# Patient Record
Sex: Male | Born: 1955 | Race: White | Hispanic: No | Marital: Married | Smoking: Former smoker
Health system: Southern US, Community
[De-identification: ages and names within clinical notes are randomized; demographics above are authoritative.]

## PROBLEM LIST (undated history)

## (undated) DIAGNOSIS — J449 Chronic obstructive pulmonary disease, unspecified: Secondary | ICD-10-CM

## (undated) DIAGNOSIS — E669 Obesity, unspecified: Secondary | ICD-10-CM

## (undated) DIAGNOSIS — J189 Pneumonia, unspecified organism: Secondary | ICD-10-CM

## (undated) HISTORY — PX: CHOLECYSTECTOMY: SHX55

---

## 2015-11-07 ENCOUNTER — Inpatient Hospital Stay (HOSPITAL_COMMUNITY): Payer: Self-pay

## 2015-11-07 ENCOUNTER — Inpatient Hospital Stay (HOSPITAL_COMMUNITY)
Admission: AD | Admit: 2015-11-07 | Discharge: 2015-11-18 | DRG: 870 | Disposition: A | Discharge status: Home / Self Care | Payer: Self-pay | Source: Other Acute Inpatient Hospital | Attending: Internal Medicine | Admitting: Internal Medicine

## 2015-11-07 DIAGNOSIS — J441 Chronic obstructive pulmonary disease with (acute) exacerbation: Secondary | ICD-10-CM | POA: Diagnosis present

## 2015-11-07 DIAGNOSIS — G9341 Metabolic encephalopathy: Secondary | ICD-10-CM | POA: Diagnosis present

## 2015-11-07 DIAGNOSIS — E870 Hyperosmolality and hypernatremia: Secondary | ICD-10-CM | POA: Diagnosis not present

## 2015-11-07 DIAGNOSIS — E874 Mixed disorder of acid-base balance: Secondary | ICD-10-CM | POA: Diagnosis present

## 2015-11-07 DIAGNOSIS — J9602 Acute respiratory failure with hypercapnia: Secondary | ICD-10-CM | POA: Diagnosis present

## 2015-11-07 DIAGNOSIS — G9340 Encephalopathy, unspecified: Secondary | ICD-10-CM

## 2015-11-07 DIAGNOSIS — Z4659 Encounter for fitting and adjustment of other gastrointestinal appliance and device: Secondary | ICD-10-CM

## 2015-11-07 DIAGNOSIS — L8990 Pressure ulcer of unspecified site, unspecified stage: Secondary | ICD-10-CM | POA: Insufficient documentation

## 2015-11-07 DIAGNOSIS — J9819 Other pulmonary collapse: Secondary | ICD-10-CM

## 2015-11-07 DIAGNOSIS — R7303 Prediabetes: Secondary | ICD-10-CM | POA: Diagnosis present

## 2015-11-07 DIAGNOSIS — Z87891 Personal history of nicotine dependence: Secondary | ICD-10-CM

## 2015-11-07 DIAGNOSIS — Z6841 Body Mass Index (BMI) 40.0 and over, adult: Secondary | ICD-10-CM

## 2015-11-07 DIAGNOSIS — T17990A Other foreign object in respiratory tract, part unspecified in causing asphyxiation, initial encounter: Secondary | ICD-10-CM | POA: Diagnosis not present

## 2015-11-07 DIAGNOSIS — J95851 Ventilator associated pneumonia: Secondary | ICD-10-CM | POA: Diagnosis present

## 2015-11-07 DIAGNOSIS — R21 Rash and other nonspecific skin eruption: Secondary | ICD-10-CM | POA: Diagnosis not present

## 2015-11-07 DIAGNOSIS — E876 Hypokalemia: Secondary | ICD-10-CM | POA: Diagnosis present

## 2015-11-07 DIAGNOSIS — J189 Pneumonia, unspecified organism: Secondary | ICD-10-CM

## 2015-11-07 DIAGNOSIS — N179 Acute kidney failure, unspecified: Secondary | ICD-10-CM | POA: Diagnosis present

## 2015-11-07 DIAGNOSIS — J9601 Acute respiratory failure with hypoxia: Secondary | ICD-10-CM

## 2015-11-07 DIAGNOSIS — Z978 Presence of other specified devices: Secondary | ICD-10-CM

## 2015-11-07 DIAGNOSIS — Z9289 Personal history of other medical treatment: Secondary | ICD-10-CM

## 2015-11-07 DIAGNOSIS — J9690 Respiratory failure, unspecified, unspecified whether with hypoxia or hypercapnia: Secondary | ICD-10-CM

## 2015-11-07 DIAGNOSIS — J440 Chronic obstructive pulmonary disease with acute lower respiratory infection: Secondary | ICD-10-CM | POA: Diagnosis present

## 2015-11-07 DIAGNOSIS — R6521 Severe sepsis with septic shock: Secondary | ICD-10-CM | POA: Diagnosis present

## 2015-11-07 DIAGNOSIS — Z452 Encounter for adjustment and management of vascular access device: Secondary | ICD-10-CM

## 2015-11-07 DIAGNOSIS — A419 Sepsis, unspecified organism: Principal | ICD-10-CM

## 2015-11-07 HISTORY — DX: Chronic obstructive pulmonary disease, unspecified: J44.9

## 2015-11-07 HISTORY — DX: Obesity, unspecified: E66.9

## 2015-11-07 HISTORY — DX: Pneumonia, unspecified organism: J18.9

## 2015-11-07 LAB — BLOOD GAS, ARTERIAL
ACID-BASE EXCESS: 0.6 mmol/L (ref 0.0–2.0)
ACID-BASE EXCESS: 0.9 mmol/L (ref 0.0–2.0)
Bicarbonate: 27.8 mEq/L — ABNORMAL HIGH (ref 20.0–24.0)
Bicarbonate: 27.1 mEq/L — ABNORMAL HIGH (ref 20.0–24.0)
DRAWN BY: 2502031
DRAWN BY: 24486
FIO2: 1
FIO2: 1
MECHVT: 600 mL
O2 SAT: 81.7 %
O2 Saturation: 95.1 %
PCO2 ART: 63.1 mmHg — AB (ref 35.0–45.0)
PEEP/CPAP: 5 cmH2O
PEEP: 8 cmH2O
PH ART: 7.26 — AB (ref 7.350–7.450)
PO2 ART: 94.5 mmHg (ref 80.0–100.0)
Patient temperature: 98.6
Patient temperature: 99.6
RATE: 16 resp/min
RATE: 22 resp/min
TCO2: 30.1 mmol/L (ref 0–100)
TCO2: 29 mmol/L (ref 0–100)
VT: 600 mL
pCO2 arterial: 74.2 mmHg (ref 35.0–45.0)
pH, Arterial: 7.199 — CL (ref 7.350–7.450)
pO2, Arterial: 54.2 mmHg — ABNORMAL LOW (ref 80.0–100.0)

## 2015-11-07 LAB — COMPREHENSIVE METABOLIC PANEL
ALBUMIN: 2 g/dL — AB (ref 3.5–5.0)
ALK PHOS: 171 U/L — AB (ref 38–126)
ALT: 42 U/L (ref 17–63)
AST: 69 U/L — AB (ref 15–41)
Anion gap: 12 (ref 5–15)
BILIRUBIN TOTAL: 1.7 mg/dL — AB (ref 0.3–1.2)
BUN: 45 mg/dL — AB (ref 6–20)
CALCIUM: 7.6 mg/dL — AB (ref 8.9–10.3)
CO2: 25 mmol/L (ref 22–32)
CREATININE: 2 mg/dL — AB (ref 0.61–1.24)
Chloride: 99 mmol/L — ABNORMAL LOW (ref 101–111)
GFR calc Af Amer: 41 mL/min — ABNORMAL LOW (ref >60)
GFR, EST NON AFRICAN AMERICAN: 36 mL/min — AB (ref >60)
GLUCOSE: 106 mg/dL — AB (ref 65–99)
Potassium: 5 mmol/L (ref 3.5–5.1)
Sodium: 136 mmol/L (ref 135–145)
TOTAL PROTEIN: 6.3 g/dL — AB (ref 6.5–8.1)

## 2015-11-07 LAB — POCT I-STAT 3, ART BLOOD GAS (G3+)
ACID-BASE DEFICIT: 2 mmol/L (ref 0.0–2.0)
Bicarbonate: 29.4 mEq/L — ABNORMAL HIGH (ref 20.0–24.0)
O2 SAT: 93 %
PCO2 ART: 82.1 mmHg — AB (ref 35.0–45.0)
TCO2: 32 mmol/L (ref 0–100)
pH, Arterial: 7.164 — CL (ref 7.350–7.450)
pO2, Arterial: 90 mmHg (ref 80.0–100.0)

## 2015-11-07 LAB — URINALYSIS, ROUTINE W REFLEX MICROSCOPIC
GLUCOSE, UA: NEGATIVE mg/dL
Ketones, ur: 15 mg/dL — AB
Leukocytes, UA: NEGATIVE
Nitrite: NEGATIVE
PH: 5.5 (ref 5.0–8.0)
PROTEIN: 100 mg/dL — AB
Specific Gravity, Urine: 1.023 (ref 1.005–1.030)

## 2015-11-07 LAB — MAGNESIUM: MAGNESIUM: 1.8 mg/dL (ref 1.7–2.4)

## 2015-11-07 LAB — CBC
HEMATOCRIT: 44.7 % (ref 39.0–52.0)
HEMOGLOBIN: 13.9 g/dL (ref 13.0–17.0)
MCH: 28.6 pg (ref 26.0–34.0)
MCHC: 31.1 g/dL (ref 30.0–36.0)
MCV: 92 fL (ref 78.0–100.0)
Platelets: 251 10*3/uL (ref 150–400)
RBC: 4.86 MIL/uL (ref 4.22–5.81)
RDW: 15.3 % (ref 11.5–15.5)
WBC: 34.5 10*3/uL — AB (ref 4.0–10.5)

## 2015-11-07 LAB — CARBOXYHEMOGLOBIN
Carboxyhemoglobin: 0.8 % (ref 0.5–1.5)
Methemoglobin: 0.9 % (ref 0.0–1.5)
O2 SAT: 93.9 %
Total hemoglobin: 13.5 g/dL (ref 13.5–18.0)

## 2015-11-07 LAB — URINE MICROSCOPIC-ADD ON

## 2015-11-07 LAB — PROTIME-INR
INR: 1.48 (ref 0.00–1.49)
PROTHROMBIN TIME: 18 s — AB (ref 11.6–15.2)

## 2015-11-07 LAB — MRSA PCR SCREENING: MRSA BY PCR: POSITIVE — AB

## 2015-11-07 LAB — TYPE AND SCREEN
ABO/RH(D): B POS
Antibody Screen: NEGATIVE

## 2015-11-07 LAB — GLUCOSE, CAPILLARY: Glucose-Capillary: 107 mg/dL — ABNORMAL HIGH (ref 65–99)

## 2015-11-07 LAB — TROPONIN I: TROPONIN I: 0.27 ng/mL — AB (ref <0.031)

## 2015-11-07 LAB — APTT: APTT: 34 s (ref 24–37)

## 2015-11-07 LAB — LACTIC ACID, PLASMA: Lactic Acid, Venous: 2.1 mmol/L (ref 0.5–2.0)

## 2015-11-07 LAB — CORTISOL: CORTISOL PLASMA: 69.4 ug/dL

## 2015-11-07 MED ORDER — IPRATROPIUM-ALBUTEROL 0.5-2.5 (3) MG/3ML IN SOLN
3.0000 mL | Freq: Four times a day (QID) | RESPIRATORY_TRACT | Status: DC
  Administered 2015-11-07 – 2015-11-17 (×37): 3 mL via RESPIRATORY_TRACT
  Filled 2015-11-07 (×36): qty 3

## 2015-11-07 MED ORDER — FENTANYL CITRATE (PF) 100 MCG/2ML IJ SOLN
100.0000 ug | INTRAMUSCULAR | Status: AC | PRN
  Administered 2015-11-08 (×2): 100 ug via INTRAVENOUS
  Administered 2015-11-08: 50 ug via INTRAVENOUS
  Filled 2015-11-07 (×2): qty 2

## 2015-11-07 MED ORDER — NOREPINEPHRINE BITARTRATE 1 MG/ML IV SOLN
0.0000 ug/min | INTRAVENOUS | Status: DC
  Administered 2015-11-07: 6 ug/min via INTRAVENOUS
  Filled 2015-11-07: qty 4

## 2015-11-07 MED ORDER — SODIUM BICARBONATE 8.4 % IV SOLN
INTRAVENOUS | Status: AC
  Administered 2015-11-07: 50 meq
  Filled 2015-11-07: qty 50

## 2015-11-07 MED ORDER — DEXMEDETOMIDINE HCL IN NACL 200 MCG/50ML IV SOLN
0.0000 ug/kg/h | INTRAVENOUS | Status: DC
  Filled 2015-11-07: qty 50

## 2015-11-07 MED ORDER — PHENYLEPHRINE HCL 10 MG/ML IJ SOLN
20.0000 ug/min | INTRAVENOUS | Status: DC
  Administered 2015-11-07: 20 ug/min via INTRAVENOUS
  Filled 2015-11-07: qty 4

## 2015-11-07 MED ORDER — ENOXAPARIN SODIUM 40 MG/0.4ML ~~LOC~~ SOLN
40.0000 mg | SUBCUTANEOUS | Status: DC
  Administered 2015-11-07: 40 mg via SUBCUTANEOUS
  Filled 2015-11-07 (×2): qty 0.4

## 2015-11-07 MED ORDER — MIDAZOLAM HCL 2 MG/2ML IJ SOLN
2.0000 mg | INTRAMUSCULAR | Status: DC | PRN
  Administered 2015-11-07 – 2015-11-14 (×6): 2 mg via INTRAVENOUS
  Filled 2015-11-07 (×6): qty 2

## 2015-11-07 MED ORDER — SODIUM CHLORIDE 0.9 % IV BOLUS (SEPSIS)
1000.0000 mL | Freq: Once | INTRAVENOUS | Status: AC
  Administered 2015-11-07: 1000 mL via INTRAVENOUS

## 2015-11-07 MED ORDER — SODIUM BICARBONATE 8.4 % IV SOLN
INTRAVENOUS | Status: AC
  Filled 2015-11-07: qty 100

## 2015-11-07 MED ORDER — CHLORHEXIDINE GLUCONATE 0.12% ORAL RINSE (MEDLINE KIT)
15.0000 mL | Freq: Two times a day (BID) | OROMUCOSAL | Status: DC
  Administered 2015-11-07 – 2015-11-11 (×9): 15 mL via OROMUCOSAL

## 2015-11-07 MED ORDER — MIDAZOLAM HCL 2 MG/2ML IJ SOLN
1.0000 mg | Freq: Once | INTRAMUSCULAR | Status: AC
  Administered 2015-11-07: 1 mg via INTRAVENOUS
  Filled 2015-11-07: qty 2

## 2015-11-07 MED ORDER — VASOPRESSIN 20 UNIT/ML IV SOLN
0.0300 [IU]/min | INTRAVENOUS | Status: DC
  Administered 2015-11-07: 0.03 [IU]/min via INTRAVENOUS
  Filled 2015-11-07: qty 2

## 2015-11-07 MED ORDER — DEXTROSE 5 % IV SOLN
500.0000 mg | INTRAVENOUS | Status: DC
  Administered 2015-11-08 – 2015-11-10 (×3): 500 mg via INTRAVENOUS
  Filled 2015-11-07 (×3): qty 500

## 2015-11-07 MED ORDER — CHLORHEXIDINE GLUCONATE 0.12% ORAL RINSE (MEDLINE KIT): 15.0000 mL | Freq: Two times a day (BID) | OROMUCOSAL | Status: DC

## 2015-11-07 MED ORDER — SODIUM BICARBONATE 8.4 % IV SOLN
INTRAVENOUS | Status: DC
  Administered 2015-11-07: 23:00:00 via INTRAVENOUS
  Filled 2015-11-07 (×2): qty 150

## 2015-11-07 MED ORDER — MIDAZOLAM HCL 2 MG/2ML IJ SOLN
2.0000 mg | INTRAMUSCULAR | Status: AC | PRN
  Administered 2015-11-08 – 2015-11-14 (×3): 2 mg via INTRAVENOUS
  Filled 2015-11-07 (×3): qty 2

## 2015-11-07 MED ORDER — LEVOFLOXACIN IN D5W 750 MG/150ML IV SOLN: 750.0000 mg | INTRAVENOUS | Status: DC

## 2015-11-07 MED ORDER — SODIUM CHLORIDE 0.9 % IV SOLN: INTRAVENOUS | Status: DC | PRN

## 2015-11-07 MED ORDER — DOBUTAMINE IN D5W 4-5 MG/ML-% IV SOLN
2.5000 ug/kg/min | INTRAVENOUS | Status: DC
  Filled 2015-11-07: qty 250

## 2015-11-07 MED ORDER — NOREPINEPHRINE BITARTRATE 1 MG/ML IV SOLN
5.0000 ug/min | INTRAVENOUS | Status: DC
  Filled 2015-11-07: qty 4

## 2015-11-07 MED ORDER — SODIUM CHLORIDE 0.9 % IV SOLN
2000.0000 mg | Freq: Once | INTRAVENOUS | Status: AC
  Administered 2015-11-07: 2000 mg via INTRAVENOUS
  Filled 2015-11-07: qty 2000

## 2015-11-07 MED ORDER — PANTOPRAZOLE SODIUM 40 MG IV SOLR
40.0000 mg | Freq: Every day | INTRAVENOUS | Status: DC
  Administered 2015-11-07 – 2015-11-09 (×3): 40 mg via INTRAVENOUS
  Filled 2015-11-07 (×4): qty 40

## 2015-11-07 MED ORDER — SODIUM BICARBONATE 8.4 % IV SOLN
100.0000 meq | Freq: Once | INTRAVENOUS | Status: AC
Start: 2015-11-07 — End: 2015-11-07
  Administered 2015-11-07: 100 meq via INTRAVENOUS
  Filled 2015-11-07: qty 100

## 2015-11-07 MED ORDER — OSELTAMIVIR PHOSPHATE 6 MG/ML PO SUSR
75.0000 mg | Freq: Two times a day (BID) | ORAL | Status: DC
  Administered 2015-11-08 – 2015-11-10 (×6): 75 mg via ORAL
  Filled 2015-11-07 (×7): qty 12.5

## 2015-11-07 MED ORDER — VASOPRESSIN 20 UNIT/ML IV SOLN
0.0300 [IU]/min | INTRAVENOUS | Status: DC
  Filled 2015-11-07: qty 2

## 2015-11-07 MED ORDER — ANTISEPTIC ORAL RINSE SOLUTION (CORINZ)
7.0000 mL | Freq: Four times a day (QID) | OROMUCOSAL | Status: DC
  Administered 2015-11-08 – 2015-11-14 (×25): 7 mL via OROMUCOSAL

## 2015-11-07 MED ORDER — FENTANYL CITRATE (PF) 100 MCG/2ML IJ SOLN
50.0000 ug | Freq: Once | INTRAMUSCULAR | Status: AC
  Administered 2015-11-07: 50 ug via INTRAVENOUS
  Filled 2015-11-07: qty 2

## 2015-11-07 MED ORDER — ANTISEPTIC ORAL RINSE SOLUTION (CORINZ): 7.0000 mL | Freq: Four times a day (QID) | OROMUCOSAL | Status: DC

## 2015-11-07 MED ORDER — MIDAZOLAM HCL 2 MG/2ML IJ SOLN
INTRAMUSCULAR | Status: AC
Start: 2015-11-07 — End: 2015-11-07
  Administered 2015-11-07: 2 mg via INTRAVENOUS
  Filled 2015-11-07: qty 2

## 2015-11-07 MED ORDER — FENTANYL CITRATE (PF) 100 MCG/2ML IJ SOLN
INTRAMUSCULAR | Status: AC
  Administered 2015-11-07: 100 ug via INTRAVENOUS
  Filled 2015-11-07: qty 2

## 2015-11-07 MED ORDER — FENTANYL CITRATE (PF) 100 MCG/2ML IJ SOLN
100.0000 ug | INTRAMUSCULAR | Status: DC | PRN
  Administered 2015-11-07 – 2015-11-08 (×3): 100 ug via INTRAVENOUS
  Filled 2015-11-07 (×4): qty 2

## 2015-11-07 MED ORDER — PIPERACILLIN-TAZOBACTAM 3.375 G IVPB
3.3750 g | Freq: Three times a day (TID) | INTRAVENOUS | Status: DC
  Administered 2015-11-07 – 2015-11-16 (×26): 3.375 g via INTRAVENOUS
  Filled 2015-11-07 (×29): qty 50

## 2015-11-07 MED ORDER — HYDROCORTISONE NA SUCCINATE PF 100 MG IJ SOLR
50.0000 mg | Freq: Four times a day (QID) | INTRAMUSCULAR | Status: DC
  Administered 2015-11-07: 50 mg via INTRAVENOUS
  Filled 2015-11-07: qty 2

## 2015-11-07 MED ORDER — PHENYLEPHRINE HCL 10 MG/ML IJ SOLN: 20.0000 ug/min | INTRAVENOUS | Status: DC

## 2015-11-07 MED ORDER — SODIUM CHLORIDE 0.9 % IV SOLN: 250.0000 mL | INTRAVENOUS | Status: DC | PRN

## 2015-11-07 MED ORDER — NOREPINEPHRINE BITARTRATE 1 MG/ML IV SOLN
5.0000 ug/min | INTRAVENOUS | Status: DC
  Filled 2015-11-07: qty 16

## 2015-11-07 NOTE — Progress Notes (Signed)
RT advanced ETT tube 2cm per Dr. Marchelle Gearingamaswamy. Tube was located at 23cm at the lip and is now at 25cm at the lip. Patient remained stable, and there were no complications.

## 2015-11-07 NOTE — Procedures (Signed)
Arterial Catheter Insertion Procedure Note Kenneth Casey [id number] 07/09/1956  Procedure: Insertion of Arterial Catheter  Indications: Blood pressure monitoring  Procedure Details Consent: Unable to obtain consent because of emergent medical necessity. Time Out: Verified patient identification, verified procedure, site/side was marked, verified correct patient position, special equipment/implants available, medications/allergies/relevent history reviewed, required imaging and test results available.  Performed  Maximum sterile technique was used including antiseptics, cap, gloves, gown, hand hygiene, mask and sheet. Skin prep: Chlorhexidine; local anesthetic administered 20 gauge catheter was inserted into right radial artery using the Seldinger technique.  Evaluation Blood flow good; BP tracing good. Complications: No apparent complications.   Kenneth SkeansSilva, Kenneth Casey 11/07/2015

## 2015-11-07 NOTE — H&P (Signed)
PULMONARY / CRITICAL CARE MEDICINE   Name: Kenneth Casey MRN: [id number] DOB: 01/16/1956    ADMISSION DATE:  11/07/2015 CONSULTATION DATE:   11/07/15  REFERRING MD :  [facility]  CHIEF COMPLAINT:  Hypercarbic respiratory failure  INITIAL PRESENTATION: Kenneth Casey is a 59 y/o male with a h/o COPD who presented to [facility] with a 1 week history of cough, fevers, and worsening SOB found to be hypoxic to 86% on RA with an initial lactic acid of 3.6 and BNP of 703. He eventually required intubation for hypercarbic respiratory failure and was stated on Levophed for hypotension.    STUDIES:  CXR 11/28: Almost complete opacification of the R lung with air bronchograms.    SIGNIFICANT EVENTS: 11/28: [facility], intubated, started on Levo  MICROBIOLOGY  Blood culture [facility] 11/28 >> Sputum [facility] 11/28 >> Blood cx [facility] 11/28 >>  ANTIBIOTICS Zosyn 11/28>> Levaquin 11/28 >> 11/28 Azithromycin 11/28-11/28  Ceftriaxone 11/28-11/28    HISTORY OF PRESENT ILLNESS:  Kenneth Casey is a 59 y/o male with a h/o COPD who presented to [facility] with a 1 week history of cough, fevers, and worsening SOB found to be hypoxic to 86% on RA with an initial lactic acid of 3.6 and BNP of 703. Blood and sputum cultures were obtained. He received a dose of azithromcyin and ceftriaxone and then broadened to Zosyn/Levaquin.  Initial ABG was 7.28/pCO2 58/pO2 66 on 4L, however he continued to decline and did not respond to BiPAP. He was subsequently intubated and transferred to [facility]. His last ABG on A/C 28, TV 500, PEEP 5, FiO2 100% was 7.3, pCO2 55, pO2 101. He received a total of 3.5L. He was also noted to be hypotensive down to 85/50 and was stated on Levophed. En route he required Fentanyl 100mcg for agitation.   PAST MEDICAL HISTORY :  Per EMR: COPD, Previous tobacco abuse   Prior to Admission medications   Not on File   Not  on File NKDA per EMR FAMILY HISTORY:  Unable to obtain has no family status information on file.  SOCIAL HISTORY:  Per EMR: Lives in [place], self-employed, quit smoking cigarettes 3 years ago, 35 pack year history  REVIEW OF SYSTEMS:  Unable to obtain due to intubation   SUBJECTIVE:  Intubated  VITAL SIGNS: Temp:  [99.2 F (37.3 C)] 99.2 F (37.3 C) (11/28 1910) Pulse Rate:  [113-130] 113 (11/28 2205) Resp:  [0-33] 22 (11/28 2205) BP: (46-126)/(30-64) 88/57 mmHg (11/28 2205) SpO2:  [91 %-94 %] 94 % (11/28 2205) FiO2 (%):  [100 %] 100 % (11/28 2135) Weight:  [327 lb 6.1 oz (148.5 kg)] 327 lb 6.1 oz (148.5 kg) (11/28 1830) HEMODYNAMICS:   VENTILATOR SETTINGS: Vent Mode:  [-] PRVC FiO2 (%):  [100 %] 100 % Set Rate:  [16 bmp-22 bmp] 22 bmp Vt Set:  [550 mL-600 mL] 600 mL PEEP:  [5 cmH20-14 cmH20] 14 cmH20 Plateau Pressure:  [29 cmH20] 29 cmH20 INTAKE / OUTPUT:  Intake/Output Summary (Last 24 hours) at 11/07/15 2221 Last data filed at 11/07/15 2000  Gross per 24 hour  Intake      0 ml  Output    275 ml  Net   -275 ml    PHYSICAL EXAMINATION: General: Lying in bed sedated.  Eyes: Conjunctivae non-injected.  ENTM: Dry mucous membranes. Oropharynx clear. No nasal discharge.  Neck: Supple, no LAD Cardiovascular: Tachycardic in 120s, regular rhythm. No murmurs, rubs, or gallops  noted. 1+ pitting edema noted in the LE bilaterally.  Respiratory: Intubated. Rhonchous with bronchial breath sounds noted over the right anterior chest wall. Abdomen: Hypoactive BS, obese, soft, non-distended, non-tender.  MSK: Normal bulk and noted. No gross deformities noted.  Skin: No rashes noted  Neuro: Sedated. Intermittently moves LE bilaterally.    LABS:  CBC  Recent Labs Lab 11/07/15 1935  WBC 34.5*  HGB 13.9  HCT 44.7  PLT 251   Coag's  Recent Labs Lab 11/07/15 2106  INR 1.48   BMET  Recent Labs Lab 11/07/15 1935  NA 136  K 5.0  CL 99*  CO2 25  BUN 45*   CREATININE 2.00*  GLUCOSE 106*   Electrolytes  Recent Labs Lab 11/07/15 1935  CALCIUM 7.6*  MG 1.8   Sepsis Markers  Recent Labs Lab 11/07/15 1935  LATICACIDVEN 2.1*   ABG  Recent Labs Lab 11/07/15 1926 11/07/15 2125  PHART 7.164* 7.199*  PCO2ART 82.1* 74.2*  PO2ART 90.0 94.5   Liver Enzymes  Recent Labs Lab 11/07/15 1935  AST 69*  ALT 42  ALKPHOS 171*  BILITOT 1.7*  ALBUMIN 2.0*   Cardiac Enzymes No results for input(s): TROPONINI, PROBNP in the last 168 hours. Glucose  Recent Labs Lab 11/07/15 1908  GLUCAP 107*    Imaging Dg Chest Port 1 View  11/07/2015  CLINICAL DATA:  Central venous catheter placement, ETT placement EXAM: PORTABLE CHEST 1 VIEW COMPARISON:  None. FINDINGS: Endotracheal tube with the tip 5.1 cm above the carina. Right jugular central venous catheter with the tip projecting over the subclavian-jugular confluence. Near complete opacification of the right lung with air bronchograms most concerning for severe multilobar pneumonia. Left lung is clear. Normal heart mediastinum. The osseous structures are unremarkable. IMPRESSION: 1. Endotracheal tube with the tip 5.1 cm above the carina. 2. Right jugular central venous catheter with the tip projecting over the subclavian-jugular confluence. 3. Near-complete opacification of the right lung with air bronchograms most concerning for severe multilobar pneumonia. Electronically Signed   By: Elige Ko   On: 11/07/2015 21:35     ASSESSMENT / PLAN:  PULMONARY OETT 11/28 >> A: Acute hypercarbic respiratory failure- COPD exacerbation with CAP with complete white of right lung Community acquired pneumonia per report P:   Full vent support VAP  ABG now Scheduled Duonebs   Stress dose steroids started however discontinued once cortisol found to be >25 After CVL placement, CXR  Bronch by Dr. Marchelle Gearing   CARDIOVASCULAR CVL- place this evening A:  Hypotension, concerns for septic shock vs  narcotics in the setting of sedation with Versed BNP 703 P:  Obtain peripheral IV access Place CVL Levo, neo and vasopressin as needed for hypotension to keep MAP >65  Obtain EKG Consider obtaining an echo   RENAL A:   Renal insufficiency - GFR 49  unsure of patient's baseline P:   Repeat BMET here  Continue to monitor  GASTROINTESTINAL A:  No acute issues  P:   SUP with Protonix NPO for now  HEMATOLOGIC A:  Leukocytosis P:  Most likely from infection, continue to monitor  INFECTIOUS A:   Community acquire pneumonia, severe with complete white out on CXR  P:    Repeat CXR in the AM Trend lactic acid Procalcitonin algorithm, sepsis protocol Discontinue Levaquin as pt received this PO as an outpatient. Continue Zosyn, start azithromycin and vancomycin Follow up blood cultures and BAL samples Strep pneumo urinary antigen, U/A   ENDOCRINE A:  Prediabetes  in 2013   P:   Alc today Phase 1 hyperglycemia protocol  NEUROLOGIC A:  Acute encephalopathy secondary to sedation P:   RASS goal: -1 to -2 PRN fentanyl and versed If continues to require sedation, consider fentanyl gtt WAU   FAMILY  - Updates: Wife and daughters updated at the beside; patient is a full code.   - Inter-disciplinary family meet or Palliative Care meeting due by:  12/5    TODAY'S SUMMARY: 59 y/o with a h/o COPD with hypercarbic respiratory failure requiring intubation at OSH with concerns for CAP on imaging.     Joanna Puff, MD, MD PGY-2,  [facility] 11/07/2015 10:21 PM

## 2015-11-07 NOTE — Progress Notes (Addendum)
ANTIBIOTIC CONSULT NOTE -   Pharmacy Consult for Zosyn and Levaquin Indication: pneumonia  Allergies: NKDA  Patient Measurements: Weight: (!) 327 lb 6.1 oz (148.5 kg)  Vital Signs: Temp: 99.2 F (37.3 C) (11/28 1910) Temp Source: Oral (11/28 1910) BP: 94/44 mmHg (11/28 1900) Pulse Rate: 127 (11/28 1900)   Labs:  Recent Labs  11/07/15 1935  WBC 34.5*  HGB 13.9  PLT 251   SCr prior to transfer ~  1.5  Microbiology: cx data: [facility]: px 11/28 blood: px  Anti- inflectives  Zosyn: 11/28 >> Azithromycin 1/28 >> Vancomycin 11/28 >>    Assessment: [age] yoM presented to OSH with elevated WBC, LA 3.6 and acute on chronic hypercapnia that required intubation. Prior to transfer he received CTX  and Azithromycin x 1 then broadened to Zosyn (EI) and Levaquin both received 11/28 am prior to transfer.  Goal of Therapy:  Treatment of infection   Plan:  1. Zosyn 3.375 EI q8H starting now  2. Levaquin 750 mg Q24H starting 11/29 am 3. F/u px cx data, pt response and adjust abx as needed   Pollyann SamplesAndy Stanton Kissoon, PharmD, BCPS 11/07/2015, 8:47 PM Pager: [phone]    * Pt now in septic shock. BP 62/30 on 50 mcg of Norepinephrine and soon to be vasopressin. Minimal UOP since admission and rising SCr based on labs from OSH earlier today. Will plan to give Vancomycin 2000 mg LD x 1 now and f/u am labs/renal fxn before scheduling any further doses. 2200 dose of Zosyn will be given over 30 min followed by EIZ afterwards for now; Levaquin discontinued in favor of Azithromycin.  Pollyann SamplesAndy Orvilla Truett, PharmD, BCPS 11/07/2015, 10:16 PM Pager: [phone]

## 2015-11-08 ENCOUNTER — Other Ambulatory Visit: Payer: Self-pay

## 2015-11-08 ENCOUNTER — Inpatient Hospital Stay (HOSPITAL_COMMUNITY): Payer: Self-pay

## 2015-11-08 ENCOUNTER — Encounter (HOSPITAL_COMMUNITY): Payer: Self-pay | Admitting: General Practice

## 2015-11-08 DIAGNOSIS — J189 Pneumonia, unspecified organism: Secondary | ICD-10-CM | POA: Diagnosis present

## 2015-11-08 DIAGNOSIS — A419 Sepsis, unspecified organism: Secondary | ICD-10-CM

## 2015-11-08 LAB — LACTIC ACID, PLASMA
LACTIC ACID, VENOUS: 1.6 mmol/L (ref 0.5–2.0)
Lactic Acid, Venous: 1.8 mmol/L (ref 0.5–2.0)

## 2015-11-08 LAB — GRAM STAIN: SPECIAL REQUESTS: NORMAL

## 2015-11-08 LAB — TROPONIN I
TROPONIN I: 0.06 ng/mL — AB (ref <0.031)
Troponin I: 0.14 ng/mL — ABNORMAL HIGH (ref <0.031)

## 2015-11-08 LAB — GLUCOSE, CAPILLARY
GLUCOSE-CAPILLARY: 108 mg/dL — AB (ref 65–99)
GLUCOSE-CAPILLARY: 118 mg/dL — AB (ref 65–99)
GLUCOSE-CAPILLARY: 133 mg/dL — AB (ref 65–99)
Glucose-Capillary: 125 mg/dL — ABNORMAL HIGH (ref 65–99)
Glucose-Capillary: 105 mg/dL — ABNORMAL HIGH (ref 65–99)

## 2015-11-08 LAB — BLOOD GAS, ARTERIAL
ACID-BASE EXCESS: 5.5 mmol/L — AB (ref 0.0–2.0)
Bicarbonate: 30.3 mEq/L — ABNORMAL HIGH (ref 20.0–24.0)
DRAWN BY: 252031
FIO2: 1
O2 SAT: 96.4 %
PCO2 ART: 51.2 mmHg — AB (ref 35.0–45.0)
PEEP: 14 cmH2O
PH ART: 7.39 (ref 7.350–7.450)
PO2 ART: 93.6 mmHg (ref 80.0–100.0)
Patient temperature: 98.6
RATE: 22 resp/min
TCO2: 31.9 mmol/L (ref 0–100)
VT: 600 mL

## 2015-11-08 LAB — CBC
HEMATOCRIT: 37 % — AB (ref 39.0–52.0)
Hemoglobin: 11.4 g/dL — ABNORMAL LOW (ref 13.0–17.0)
MCH: 27.7 pg (ref 26.0–34.0)
MCHC: 30.8 g/dL (ref 30.0–36.0)
MCV: 90 fL (ref 78.0–100.0)
PLATELETS: 267 10*3/uL (ref 150–400)
RBC: 4.11 MIL/uL — AB (ref 4.22–5.81)
RDW: 15.1 % (ref 11.5–15.5)
WBC: 26.9 10*3/uL — AB (ref 4.0–10.5)

## 2015-11-08 LAB — BASIC METABOLIC PANEL
ANION GAP: 10 (ref 5–15)
BUN: 50 mg/dL — AB (ref 6–20)
CHLORIDE: 99 mmol/L — AB (ref 101–111)
CO2: 33 mmol/L — AB (ref 22–32)
CREATININE: 1.82 mg/dL — AB (ref 0.61–1.24)
Calcium: 7.4 mg/dL — ABNORMAL LOW (ref 8.9–10.3)
GFR calc Af Amer: 46 mL/min — ABNORMAL LOW (ref >60)
GFR, EST NON AFRICAN AMERICAN: 40 mL/min — AB (ref >60)
Glucose, Bld: 151 mg/dL — ABNORMAL HIGH (ref 65–99)
Potassium: 4.1 mmol/L (ref 3.5–5.1)
Sodium: 142 mmol/L (ref 135–145)

## 2015-11-08 LAB — PROCALCITONIN: Procalcitonin: 17.42 ng/mL

## 2015-11-08 LAB — HEMOGLOBIN A1C
Hgb A1c MFr Bld: 6.5 % — ABNORMAL HIGH (ref 4.8–5.6)
MEAN PLASMA GLUCOSE: 140 mg/dL

## 2015-11-08 LAB — STREP PNEUMONIAE URINARY ANTIGEN: STREP PNEUMO URINARY ANTIGEN: POSITIVE — AB

## 2015-11-08 LAB — HIV ANTIBODY (ROUTINE TESTING W REFLEX): HIV SCREEN 4TH GENERATION: NONREACTIVE

## 2015-11-08 LAB — ABO/RH: ABO/RH(D): B POS

## 2015-11-08 MED ORDER — VECURONIUM BROMIDE 10 MG IV SOLR
INTRAVENOUS | Status: AC
Start: 2015-11-08 — End: 2015-11-09
  Filled 2015-11-08: qty 10

## 2015-11-08 MED ORDER — HEPARIN SODIUM (PORCINE) 5000 UNIT/ML IJ SOLN
5000.0000 [IU] | Freq: Three times a day (TID) | INTRAMUSCULAR | Status: DC
  Administered 2015-11-09 – 2015-11-18 (×28): 5000 [IU] via SUBCUTANEOUS
  Filled 2015-11-08 (×30): qty 1

## 2015-11-08 MED ORDER — FENTANYL BOLUS VIA INFUSION
25.0000 ug | INTRAVENOUS | Status: DC | PRN
  Administered 2015-11-08 – 2015-11-09 (×3): 50 ug via INTRAVENOUS
  Filled 2015-11-08: qty 50

## 2015-11-08 MED ORDER — ROCURONIUM BROMIDE 50 MG/5ML IV SOLN
50.0000 mg | Freq: Once | INTRAVENOUS | Status: AC
  Administered 2015-11-08: 50 mg via INTRAVENOUS
  Filled 2015-11-08: qty 5

## 2015-11-08 MED ORDER — MIDAZOLAM HCL 2 MG/2ML IJ SOLN: 4.0000 mg | Freq: Once | INTRAMUSCULAR | Status: AC

## 2015-11-08 MED ORDER — PROPOFOL 10 MG/ML IV BOLUS
INTRAVENOUS | Status: AC
  Administered 2015-11-08: 40 mg via INTRAVENOUS
  Filled 2015-11-08: qty 20

## 2015-11-08 MED ORDER — FENTANYL CITRATE (PF) 100 MCG/2ML IJ SOLN
INTRAMUSCULAR | Status: AC
  Administered 2015-11-08: 50 ug via INTRAVENOUS
  Filled 2015-11-08: qty 4

## 2015-11-08 MED ORDER — PRO-STAT SUGAR FREE PO LIQD
30.0000 mL | Freq: Three times a day (TID) | ORAL | Status: DC
  Administered 2015-11-08 – 2015-11-14 (×20): 30 mL
  Filled 2015-11-08 (×23): qty 30

## 2015-11-08 MED ORDER — PROPOFOL 10 MG/ML IV BOLUS
40.0000 mg | Freq: Once | INTRAVENOUS | Status: AC
  Administered 2015-11-08: 40 mg via INTRAVENOUS

## 2015-11-08 MED ORDER — FENTANYL CITRATE (PF) 100 MCG/2ML IJ SOLN
50.0000 ug | Freq: Once | INTRAMUSCULAR | Status: AC
  Administered 2015-11-08: 50 ug via INTRAVENOUS

## 2015-11-08 MED ORDER — VITAL HIGH PROTEIN PO LIQD
1000.0000 mL | ORAL | Status: DC
  Administered 2015-11-08 – 2015-11-09 (×2): 1000 mL
  Administered 2015-11-09: 06:00:00
  Administered 2015-11-10 – 2015-11-14 (×8): 1000 mL
  Filled 2015-11-08 (×11): qty 1000

## 2015-11-08 MED ORDER — PERFLUTREN LIPID MICROSPHERE
1.0000 mL | INTRAVENOUS | Status: AC | PRN
  Administered 2015-11-08: 4 mL via INTRAVENOUS
  Filled 2015-11-08: qty 10

## 2015-11-08 MED ORDER — VANCOMYCIN HCL IN DEXTROSE 1-5 GM/200ML-% IV SOLN
1000.0000 mg | Freq: Two times a day (BID) | INTRAVENOUS | Status: DC
  Administered 2015-11-08 – 2015-11-09 (×3): 1000 mg via INTRAVENOUS
  Filled 2015-11-08 (×4): qty 200

## 2015-11-08 MED ORDER — SODIUM CHLORIDE 0.9 % IV SOLN
25.0000 ug/h | INTRAVENOUS | Status: DC
  Administered 2015-11-08: 25 ug/h via INTRAVENOUS
  Administered 2015-11-09 (×2): 250 ug/h via INTRAVENOUS
  Administered 2015-11-10: 50 ug/h via INTRAVENOUS
  Administered 2015-11-10: 200 ug/h via INTRAVENOUS
  Administered 2015-11-10 (×2): 50 ug/h via INTRAVENOUS
  Administered 2015-11-10: 30 ug/h via INTRAVENOUS
  Administered 2015-11-11 – 2015-11-12 (×3): 300 ug/h via INTRAVENOUS
  Administered 2015-11-12: 150 ug/h via INTRAVENOUS
  Administered 2015-11-13: 75 ug/h via INTRAVENOUS
  Administered 2015-11-14 (×2): 300 ug/h via INTRAVENOUS
  Administered 2015-11-14: 250 ug/h via INTRAVENOUS
  Administered 2015-11-15: 300 ug/h via INTRAVENOUS
  Filled 2015-11-08 (×16): qty 50

## 2015-11-08 MED FILL — Fentanyl Citrate Preservative Free (PF) Inj 100 MCG/2ML: INTRAMUSCULAR | Qty: 2 | Status: AC

## 2015-11-08 NOTE — Procedures (Signed)
Intubation Procedure Note Kenneth DewHerman Casey [id number] 09/21/1956  Procedure: Intubation Indications: Airway protection and maintenance  Procedure Details Consent: Risks of procedure as well as the alternatives and risks of each were explained to the (patient/caregiver).  Consent for procedure obtained. Time Out: Verified patient identification, verified procedure, site/side was marked, verified correct patient position, special equipment/implants available, medications/allergies/relevent history reviewed, required imaging and test results available.  Performed  Maximum sterile technique was used including gloves, gown, hand hygiene and mask.  MAC and 4    Evaluation Hemodynamic Status: BP stable throughout; O2 sats: transiently fell during during procedure Patient's Current Condition: stable Complications: No apparent complications Patient did tolerate procedure well. Chest X-ray ordered to verify placement.  CXR: pending.   Kenneth Casey, Kenneth Casey 11/08/2015

## 2015-11-08 NOTE — Progress Notes (Signed)
Precedex order with d/c'd.  Entire bottle of Precedx was wasted in [place].  Witnessed by Marcellina MillinMindy Hopper, RN

## 2015-11-08 NOTE — Progress Notes (Signed)
  Echocardiogram 2D Echocardiogram has been performed.  Kenneth SavoyCasey N Yanett Kenneth 11/08/2015, 1:25 PM

## 2015-11-08 NOTE — Care Management Note (Signed)
Case Management Note  Patient Details  Name: Dorena DewHerman Stalder MRN: [id number] Date of Birth: 03/23/1956  Subjective/Objective:   Has wife and daughters, independent prior                  Action/Plan:   Expected Discharge Date:                  Expected Discharge Plan:  Home/Self Care  In-House Referral:     Discharge planning Services  CM Consult  Post Acute Care Choice:    Choice offered to:     DME Arranged:    DME Agency:     HH Arranged:    HH Agency:     Status of Service:  In process, will continue to follow  Medicare Important Message Given:    Date Medicare IM Given:    Medicare IM give by:    Date Additional Medicare IM Given:    Additional Medicare Important Message give by:     If discussed at Long Length of Stay Meetings, dates discussed:    Additional Comments:  Vangie BickerBrown, Korry Dalgleish Jane, RN 11/08/2015, 8:49 AM

## 2015-11-08 NOTE — Progress Notes (Signed)
Patient self extubated. Reintubated with 7.5 ETT 24@ the lip. RT will continue to monitor.

## 2015-11-08 NOTE — Progress Notes (Signed)
PULMONARY / CRITICAL CARE MEDICINE   Name: Kenneth Casey MRN: [id number] DOB: [birth year]    ADMISSION DATE:  11/07/2015 CONSULTATION DATE:   11/07/15  REFERRING MD :  [facility]  CHIEF COMPLAINT:  Hypercarbic respiratory failure  INITIAL PRESENTATION: Kenneth Casey is a 59 y/o male with a h/o COPD who presented to [facility] with a 1 week history of cough, fevers, and worsening SOB found to be hypoxic to 86% on RA with an initial lactic acid of 3.6 and BNP of 703. He eventually required intubation for hypercarbic respiratory failure and was stated on Levophed for hypotension.    STUDIES:  CXR 11/28: Almost complete opacification of the R lung with air bronchograms.  CT Chest w/o 11/29:  Personally reviewed by me. Dense right lung opacity with air bronchograms consistent with consolidation.  SIGNIFICANT EVENTS: 11/28 - Respiratory failure at [facility], intubated, started on Levo 11/28 - Transferred to [facility]  MICROBIOLOGY  Blood culture [facility] 11/28 >> Sputum culture [facility] 11/28 >> Blood cx [facility] 11/28 >> Urine Strep Ag 11/28:  Positive Urine Legionella Ag 11/28>> Respiratory Viral Panel PCR 11/28>>  ANTIBIOTICS Zosyn 11/28>> Vancomycin 11/28>> Azithromycin 11/28>> Tamiflu 11/28>>  Levaquin 11/28 - 11/28 Ceftriaxone 11/28 - 11/28   SUBJECTIVE:  Patient was weaned off of vasopressor support overnight. Marginal improvement in FiO2 requirement. Patient was able to nod yes to pain in his throat from his endotracheal tube.  REVIEW OF SYSTEMS:  Unable to obtain due to intubation & sedation.  VITAL SIGNS: Temp:  [99.1 F (37.3 C)-100.5 F (38.1 C)] 100.2 F (37.9 C) (11/29 1139) Pulse Rate:  [102-130] 114 (11/29 1200) Resp:  [0-33] 24 (11/29 1200) BP: (46-126)/(30-64) 99/55 mmHg (11/29 1129) SpO2:  [80 %-98 %] 93 % (11/29 1200) FiO2 (%):  [80 %-100 %] 80 % (11/29 1130) Weight:  [327 lb 6.1 oz (148.5 kg)-331 lb 5.6 oz (150.3 kg)] 331 lb 5.6 oz  (150.3 kg) (11/29 0343) HEMODYNAMICS: CVP:  [20 mmHg] 20 mmHg VENTILATOR SETTINGS: Vent Mode:  [-] PRVC FiO2 (%):  [80 %-100 %] 80 % Set Rate:  [16 bmp-22 bmp] 22 bmp Vt Set:  [550 mL-600 mL] 600 mL PEEP:  [5 cmH20-14 cmH20] 14 cmH20 Plateau Pressure:  [29 cmH20-32 cmH20] 32 cmH20 INTAKE / OUTPUT:  Intake/Output Summary (Last 24 hours) at 11/08/15 1223 Last data filed at 11/08/15 1100  Gross per 24 hour  Intake 2030.83 ml  Output   1030 ml  Net 1000.83 ml    PHYSICAL EXAMINATION: General:  Awake. No acute distress. No family at bedside this morning during time of exam.  Integument:  Warm & dry. No rash on exposed skin.  HEENT:  No scleral injection or icterus. Endotracheal tube in place. PERRL. Cardiovascular:  Regular rate. Unable to appreciate JVD given body habitus.  Pulmonary:  Coarse breath sounds bilaterally. Symmetric chest wall rise on ventilator. Abdomen: Soft. Normal bowel sounds. Protuberant. Neurological: Moving all 4 extremities equally. Nodding to questions.   LABS:  CBC  Recent Labs Lab 11/07/15 1935 11/08/15 0500  WBC 34.5* 26.9*  HGB 13.9 11.4*  HCT 44.7 37.0*  PLT 251 267   Coag's  Recent Labs Lab 11/07/15 2106 11/07/15 2311  APTT  --  34  INR 1.48  --    BMET  Recent Labs Lab 11/07/15 1935 11/08/15 0500  NA 136 142  K 5.0 4.1  CL 99* 99*  CO2 25 33*  BUN 45* 50*  CREATININE 2.00* 1.82*  GLUCOSE 106*  151*   Electrolytes  Recent Labs Lab 11/07/15 1935 11/08/15 0500  CALCIUM 7.6* 7.4*  MG 1.8  --    Sepsis Markers  Recent Labs Lab 11/07/15 1935 11/07/15 2311 11/08/15 0723  LATICACIDVEN 2.1*  --  1.8  PROCALCITON  --  17.42  --    ABG  Recent Labs Lab 11/07/15 2125 11/07/15 2250 11/08/15 0400  PHART 7.199* 7.260* 7.390  PCO2ART 74.2* 63.1* 51.2*  PO2ART 94.5 54.2* 93.6   Liver Enzymes  Recent Labs Lab 11/07/15 1935  AST 69*  ALT 42  ALKPHOS 171*  BILITOT 1.7*  ALBUMIN 2.0*   Cardiac  Enzymes  Recent Labs Lab 11/07/15 2311 11/08/15 0550  TROPONINI 0.27* 0.14*   Glucose  Recent Labs Lab 11/07/15 1908 11/08/15 0024 11/08/15 0328 11/08/15 0745  GLUCAP 107* 133* 125* 118*    Imaging Ct Chest Wo Contrast  11/08/2015  CLINICAL DATA:  Pneumonia.  Intubated. EXAM: CT CHEST WITHOUT CONTRAST TECHNIQUE: Multidetector CT imaging of the chest was performed following the standard protocol without IV contrast. COMPARISON:  Chest radiograph from one day prior. FINDINGS: Mediastinum/Nodes: Normal heart size. No pericardial fluid/thickening. Left anterior descending, left right coronary atherosclerosis. Right internal jugular central venous catheter terminates in right brachiocephalic vein. Great vessels are normal in course and caliber. Normal visualized thyroid. Normal esophagus. No axillary adenopathy. There are several mildly to moderately enlarged right paratracheal nodes, for example a 1.6 cm upper right paratracheal node (series 2/image 4) and a 1.3 cm lower right paratracheal node (2/19). There is mild subcarinal lymphadenopathy, for example a 1.3 cm subcarinal node (2/30). No discrete hilar adenopathy on this noncontrast study. Lungs/Pleura: Endotracheal tube tip is 4.8 cm above the carina. No pneumothorax. No pleural effusion. There is dense consolidation throughout the right lobe with scattered air bronchograms and prominent volume loss in the right lung. No discrete central airway mass is seen in the right lung. Segmental consolidation in the medial basilar left lower lobe likely represents atelectasis given the associated volume loss. No significant pulmonary nodules or lung masses in the aerated portions of the left lung. Upper abdomen: Enteric tube terminates in the proximal body of the stomach. Status post cholecystectomy. Musculoskeletal: No aggressive appearing focal osseous lesions. Minimal degenerative changes in the thoracic spine. IMPRESSION: 1. Dense consolidation with  scattered air bronchograms and prominent volume loss throughout the right lung. No discrete central airway mass. These findings likely represent a combination of atelectasis and pneumonia, although an underlying lung mass cannot be excluded. Continued chest imaging follow-up is warranted. 2. Segmental atelectasis in the medial basilar left lower lobe. 3. Nonspecific mild to moderate mediastinal lymphadenopathy. 4. Three-vessel coronary atherosclerosis. 5. Well-positioned endotracheal and enteric tubes and right internal jugular line. Electronically Signed   By: Delbert Phenix M.D.   On: 11/08/2015 07:43   Dg Chest Port 1 View  11/07/2015  CLINICAL DATA:  Central venous catheter placement, ETT placement EXAM: PORTABLE CHEST 1 VIEW COMPARISON:  None. FINDINGS: Endotracheal tube with the tip 5.1 cm above the carina. Right jugular central venous catheter with the tip projecting over the subclavian-jugular confluence. Near complete opacification of the right lung with air bronchograms most concerning for severe multilobar pneumonia. Left lung is clear. Normal heart mediastinum. The osseous structures are unremarkable. IMPRESSION: 1. Endotracheal tube with the tip 5.1 cm above the carina. 2. Right jugular central venous catheter with the tip projecting over the subclavian-jugular confluence. 3. Near-complete opacification of the right lung with air bronchograms most concerning for  severe multilobar pneumonia. Electronically Signed   By: Elige KoHetal  Patel   On: 11/07/2015 21:35     ASSESSMENT / PLAN:  PULMONARY OETT 11/28 >> A: Acute Hypoxic Respiratory Failure - Secondary to Severe CAP Acute Hypercarbic Respiratory Failure  Severe CAP  P:   Full vent support VAP  Duoneb q6hr Holding on diuresis given Acute Renal Failure  CARDIOVASCULAR R IJ CVL 11/28>> A:  Septic Shock - Shock has resolved.  P:  TTE pending Monitoring on telemetry Trending Troponin I  RENAL A:   Acute Renal Failure -  Improving.  P:   Trending UOP with foley catheter Trending renal function with daily BUN/Creatinine Avoiding nephrotoxic agents  GASTROINTESTINAL A:   No acute issues   P:   Protonix IV daily Nutrition consult for tube feeding recommendations  HEMATOLOGIC A:   Leukocytosis - Secondary to Sepsis. Improving.  P:  Trending Leukocyte count daily with CBC SCDs Heparin Coco q8hr  INFECTIOUS A:   Septic Shock - Secondary to Severe CAP Severe CAP - Treated as outpt with Levaquin.  P:    Day #2 of Zosyn, Vancomycin, & Azithromycin Procalcitonin algorithm  Awaiting Culture & Urine study results  ENDOCRINE A:   Prediabetes in 2013  - Hgb A1c 6.5 11/28  P:   Accu-Checks q4hr  NEUROLOGIC A:   Sedation on Ventilator  P:   RASS goal: 0 to -1 Versed IV prn Fentanyl IV prn Holding on sedation vacation until FiO2 requirement improves dramatically   FAMILY  - Updates: Wife & daughter updated at bedside by JN on 11/29.  - Inter-disciplinary family meet or Palliative Care meeting due by:  12/5   TODAY'S SUMMARY: Unfortunate 59 year old morbidly obese male admitted with severe community acquired pneumonia and acute hypoxic respiratory failure. Slow progress on FiO2 requirement with PE protocol treatment. Patient has weaned off of vasopressor support at this time. Acute renal failure slowly improving as well.  I have spent a total of 32 minutes of critical care time today independently of the resident's time caring for the patient, updating the patient's family bedside, & reviewing the patient's electronic medical record.  Donna ChristenJennings E. Jamison NeighborNestor, M.D. [facility] Pager:  [phone] After 3pm or if no response, call [phone]  11/08/2015 12:23 PM

## 2015-11-08 NOTE — Progress Notes (Signed)
Wasted 50 mcg fentanyl in sink with Corlis Hoveie Pataky RN

## 2015-11-08 NOTE — Progress Notes (Addendum)
Initial Nutrition Assessment  DOCUMENTATION CODES:   Morbid obesity  INTERVENTION:    Initiate TF via OGT with Vital High Protein at 25 ml/h and Prostat 30 ml TID on day 1; on day 2, increase to goal rate of 70 ml/h (1680 ml per day) to provide 1980 kcals, 192 gm protein, 1404 ml free water daily.  NUTRITION DIAGNOSIS:   Inadequate oral intake related to inability to eat as evidenced by NPO status.  GOAL:   Provide needs based on ASPEN/SCCM guidelines  MONITOR:   Vent status, Labs, Weight trends, TF tolerance, I & O's  REASON FOR ASSESSMENT:   Consult Enteral/tube feeding initiation and management  ASSESSMENT:   59 y/o male with a h/o COPD who [facility] with a 1 week history of cough, fevers, and worsening SOB found to be hypoxic to 86% on RA with an initial lactic acid of 3.6 and BNP of 703. He eventually required intubation for hypercarbic respiratory failure and was started on Levophed for hypotension.CXR on 11/28 showed almost complete opacification of the R lung with air bronchograms.  Patient is currently intubated on ventilator support Temp (24hrs), Avg:99.6 F (37.6 C), Min:99.1 F (37.3 C), Max:100.5 F (38.1 C)   Received MD Consult for TF initiation and management.  Unable to complete Nutrition-Focused physical exam at this time.    Diet Order:  Diet NPO time specified  Skin:  Reviewed, no issues  Last BM:  unknown  Height:   Ht Readings from Last 1 Encounters:  11/07/15 5\' 10"  (1.778 m)    Weight:   Wt Readings from Last 1 Encounters:  11/08/15 331 lb 5.6 oz (150.3 kg)    Ideal Body Weight:  75.5 kg  BMI:  Body mass index is 47.54 kg/(m^2).  Estimated Nutritional Needs:   Kcal:  1650-2100  Protein:  188 gm  Fluid:  >/= 2 L  EDUCATION NEEDS:   No education needs identified at this time  Joaquin CourtsKimberly Harris, RD, LDN, CNSC Pager [phone] After Hours Pager [phone]

## 2015-11-08 NOTE — Procedures (Signed)
Central Venous Catheter Insertion Procedure Note Kenneth Casey [id number] 10/03/1956  Procedure: Insertion of Central Venous Catheter Indications: Assessment of intravascular volume, Drug and/or fluid administration and Frequent blood sampling  Procedure Details Consent: Risks of procedure as well as the alternatives and risks of each were explained to the (patient/caregiver).  Consent for procedure obtained. Time Out: Verified patient identification, verified procedure, site/side was marked, verified correct patient position, special equipment/implants available, medications/allergies/relevent history reviewed, required imaging and test results available.  Performed  Maximum sterile technique was used including antiseptics, cap, gloves, gown, hand hygiene, mask and sheet. Skin prep: Chlorhexidine; local anesthetic administered A antimicrobial bonded/coated triple lumen catheter was placed in the right internal jugular vein using the Seldinger technique. Ultrasound guidance used.Yes.   Catheter placed to 17 cm. Blood aspirated via all 3 ports and then flushed x 3. Line sutured x 2 and dressing applied.  Evaluation Blood flow good Complications: No apparent complications Patient did tolerate procedure well. Chest X-ray ordered to verify placement.  CXR: pending.  Kenneth RoachPaul Laaibah Casey, AGACNP-BC [facility] Pulmonology/Critical Care Pager [phone] or [phone]  11/08/2015 3:42 AM

## 2015-11-08 NOTE — Procedures (Signed)
Medications: 1. Versed 4 mg IV push 2. Fentanyl 50 g IV push 3. Propofol 40 mg IV push  4. Rocuronium 50 mg IV push (given after patient was sedated)  Description of Procedure: Patient was emergently reintubated after he self extubated. Patient had profound hypoxia and was combative and aggressive towards staff. He was given IV sedation followed by rocuronium IV. Glidescope was utilized to have excellent visualization of the patient's vocal cords. A 7.5 endotracheal tube was advanced between the vocal cords with ease. Repetitive capnography color change was visualized. Bilateral breath sounds were auscultated by respiratory therapist. Tube was secured in place. Stat portable chest x-ray ordered to confirm tube placement.

## 2015-11-08 NOTE — Progress Notes (Signed)
Transported patient to C.T while patient was on the ventilator. Patient remained stable during transport.  

## 2015-11-08 NOTE — Progress Notes (Signed)
ANTIBIOTIC CONSULT NOTE   Pharmacy Consult for Vanc/Zosyn Indication: pneumonia  Allergies: NKDA  Patient Measurements: Height: 5\' 10"  (177.8 cm) Weight: (!) 331 lb 5.6 oz (150.3 kg) IBW/kg (Calculated) : 73  Vital Signs: Temp: 100.2 F (37.9 C) (11/29 1139) Temp Source: Oral (11/29 1139) BP: 114/57 mmHg (11/29 1451) Pulse Rate: 108 (11/29 1500)   Labs:  Recent Labs  11/07/15 1935 11/08/15 0500  WBC 34.5* 26.9*  HGB 13.9 11.4*  PLT 251 267  CREATININE 2.00* 1.82*     Assessment: 58 yoM presented to OSH with acute on chronic hypercapnia that required intubation then [facility] Pt reported 1 week hx of cough, fevers, and worsening SOB.  CAP with septic shock. WBC 34.5>26.9, Tm 100.5, Lactic acid 3.6 > 2.1. PCT 17.42 SCr improved 2.0 > 1.82 (baseline unknown - SCr prior to transfer 1.5).  Zosyn 11/28 >>  Azithromycin 11/28 >> Vancomycin 11/28 >> Oseltamivir 11/29 >>  Ceftriaxone 11/28 x1  11/28 Urine strep positive 11/28 Bcx: NGTD 11/28 Resp virus panel: 11/29 Trach gram stain: No organisms 11/28 MRSA PCR positive 11/28 Legionella: in process  Goal of Therapy:  Resolution of infection  Vanc trough 15-20  Plan:  - Vancomycin 1g IV q12h - Zosyn 3.375 g IV q8h - Azithromycin 500 mg IV q24h - [facility] renal function, cultures, LOT, [facility]   Cristy Reyes, PharmD Clinical Pharmacy Resident Pager # [phone] 11/08/2015 4:07 PM

## 2015-11-09 ENCOUNTER — Encounter (HOSPITAL_COMMUNITY): Payer: Self-pay

## 2015-11-09 LAB — RENAL FUNCTION PANEL
Albumin: 1.6 g/dL — ABNORMAL LOW (ref 3.5–5.0)
Anion gap: 9 (ref 5–15)
BUN: 60 mg/dL — AB (ref 6–20)
CHLORIDE: 100 mmol/L — AB (ref 101–111)
CO2: 34 mmol/L — AB (ref 22–32)
CREATININE: 1.76 mg/dL — AB (ref 0.61–1.24)
Calcium: 7.7 mg/dL — ABNORMAL LOW (ref 8.9–10.3)
GFR calc Af Amer: 48 mL/min — ABNORMAL LOW (ref >60)
GFR calc non Af Amer: 42 mL/min — ABNORMAL LOW (ref >60)
GLUCOSE: 137 mg/dL — AB (ref 65–99)
POTASSIUM: 3.7 mmol/L (ref 3.5–5.1)
Phosphorus: 3.1 mg/dL (ref 2.5–4.6)
Sodium: 143 mmol/L (ref 135–145)

## 2015-11-09 LAB — CBC WITH DIFFERENTIAL/PLATELET
BASOS ABS: 0 10*3/uL (ref 0.0–0.1)
Basophils Relative: 0 %
EOS ABS: 0 10*3/uL (ref 0.0–0.7)
Eosinophils Relative: 0 %
HCT: 36.1 % — ABNORMAL LOW (ref 39.0–52.0)
Hemoglobin: 11.5 g/dL — ABNORMAL LOW (ref 13.0–17.0)
LYMPHS ABS: 1.7 10*3/uL (ref 0.7–4.0)
LYMPHS PCT: 7 %
MCH: 28.3 pg (ref 26.0–34.0)
MCHC: 31.9 g/dL (ref 30.0–36.0)
MCV: 88.7 fL (ref 78.0–100.0)
MONO ABS: 0.7 10*3/uL (ref 0.1–1.0)
Monocytes Relative: 3 %
NEUTROS ABS: 22.5 10*3/uL — AB (ref 1.7–7.7)
Neutrophils Relative %: 90 %
PLATELETS: 253 10*3/uL (ref 150–400)
RBC: 4.07 MIL/uL — ABNORMAL LOW (ref 4.22–5.81)
RDW: 15.2 % (ref 11.5–15.5)
WBC: 24.9 10*3/uL — ABNORMAL HIGH (ref 4.0–10.5)

## 2015-11-09 LAB — MAGNESIUM: MAGNESIUM: 2.8 mg/dL — AB (ref 1.7–2.4)

## 2015-11-09 LAB — GLUCOSE, CAPILLARY
GLUCOSE-CAPILLARY: 118 mg/dL — AB (ref 65–99)
GLUCOSE-CAPILLARY: 158 mg/dL — AB (ref 65–99)
GLUCOSE-CAPILLARY: 130 mg/dL — AB (ref 65–99)
Glucose-Capillary: 110 mg/dL — ABNORMAL HIGH (ref 65–99)
Glucose-Capillary: 131 mg/dL — ABNORMAL HIGH (ref 65–99)
Glucose-Capillary: 132 mg/dL — ABNORMAL HIGH (ref 65–99)
Glucose-Capillary: 140 mg/dL — ABNORMAL HIGH (ref 65–99)

## 2015-11-09 LAB — MPO/PR-3 (ANCA) ANTIBODIES
ANCA Proteinase 3: <3.5 U/mL (ref 0.0–3.5)
Myeloperoxidase Abs: <9 U/mL (ref 0.0–9.0)

## 2015-11-09 LAB — LEGIONELLA PNEUMOPHILA SEROGP 1 UR AG: L. PNEUMOPHILA SEROGP 1 UR AG: NEGATIVE

## 2015-11-09 LAB — PROCALCITONIN: PROCALCITONIN: 10.74 ng/mL

## 2015-11-09 NOTE — Progress Notes (Signed)
ANTIBIOTIC CONSULT NOTE   Pharmacy Consult for Vanc/Zosyn Indication: pneumonia  Allergies: NKDA  Patient Measurements: Height: 5\' 10"  (177.8 cm) Weight: (!) 331 lb 12.7 oz (150.5 kg) IBW/kg (Calculated) : 73  Vital Signs: Temp: 100 F (37.8 C) (11/30 1540) Temp Source: Oral (11/30 1540) BP: 142/66 mmHg (11/30 1504) Pulse Rate: 101 (11/30 1504)   Labs:  Recent Labs  11/07/15 1935 11/08/15 0500 11/09/15 0450  WBC 34.5* 26.9* 24.9*  HGB 13.9 11.4* 11.5*  PLT 251 267 253  CREATININE 2.00* 1.82* 1.76*     Assessment: 58 yoM presented to OSH with acute on chronic hypercapnia that required intubation [facility] Pt reported 1 week hx of cough, fevers, and worsening SOB.   SCr improved 2.0 > 1.82 (baseline unknown - SCr prior to transfer 1.5), although UOP is poor at 0.3 ml/kg/hr.   Zosyn 11/28 >>  Azithromycin 11/28 >> Vancomycin 11/28 >> Oseltamivir 11/29 >>  Ceftriaxone 11/28 x1  11/28 Urine strep positive 11/28 Bcx: NGTD 11/28 Resp virus panel: 11/29 Trach gram stain: No organisms 11/28 MRSA PCR positive 11/28 Legionella: in process  Goal of Therapy:  Resolution of infection  Vanc trough 15-20  Plan:  - Hold vanc due to poor UOP causing concern for accumulation  - VT tomorrow 1100 - F/u new dosing schedule after VT - Zosyn 3.375 g IV q8h - Azithromycin 500 mg IV q24h - [facility] function[facility] cultures, LOT[facility]   Cristy Reyes, PharmD Clinical Pharmacy Resident Pager # [phone] 11/09/2015 3:51 PM

## 2015-11-09 NOTE — Progress Notes (Signed)
PULMONARY / CRITICAL CARE MEDICINE   Name: Kenneth Casey MRN: [id number] DOB: July 28, 1956    ADMISSION DATE:  11/07/2015 CONSULTATION DATE:   11/07/15  REFERRING MD :  [facility]  CHIEF COMPLAINT:  Hypercarbic respiratory failure  INITIAL PRESENTATION: Kenneth Casey is a 59 y/o male with a h/o COPD who presented to [facility] with a 1 week history of cough, fevers, and worsening SOB found to be hypoxic to 86% on RA with an initial lactic acid of 3.6 and BNP of 703. He eventually required intubation for hypercarbic respiratory failure and was stated on Levophed for hypotension.    STUDIES:  CXR 11/28: Almost complete opacification of the R lung with air bronchograms.  CT Chest w/o 11/29:  Personally reviewed by me. Dense right lung opacity with air bronchograms consistent with consolidation. TTE 11/29:  Extremely limited. Mild LVH with dilation. EF 55-60%. Grade 1 diastolic dysfunction. Cannot r/o wall motion abnormality. RV poorly visualized. No aortic stenosis or regurg. No mitral stenosis or regurg. No effusion.  SIGNIFICANT EVENTS: 11/28 - Respiratory failure at [facility], intubated, started on Levo 11/28 - Transferred to [facility] 11/29 - Self extubated & reintubated by JN  MICROBIOLOGY  Trach Aspirate 11/30>> Blood culture [place] 11/28 >> Sputum Grm Stain [place] 11/28:  Many WBC Blood cx [facility] 11/28 >> Urine Strep Ag 11/28:  Positive Urine Legionella Ag 11/28>> Respiratory Viral Panel PCR 11/28>> HIV 11/29:  Negative  ANTIBIOTICS Zosyn 11/28>> Vancomycin 11/28>> Azithromycin 11/28>> Tamiflu 11/28>>  Levaquin 11/28 - 11/28 Ceftriaxone 11/28 - 11/28   SUBJECTIVE: Self extubated yesterday. Reintubated by myself yesterday. Family notified and at bedside last evening.  REVIEW OF SYSTEMS:  Unable to obtain due to intubation & sedation.  VITAL SIGNS: Temp:  [99.3 F (37.4 C)-100.6 F (38.1 C)] 99.7 F (37.6 C) (11/30 1116) Pulse Rate:  [94-123]  98 (11/30 1200) Resp:  [21-38] 22 (11/30 1200) BP: (110-133)/(56-69) 123/59 mmHg (11/30 1130) SpO2:  [65 %-96 %] 93 % (11/30 1200) FiO2 (%):  [50 %-70 %] 50 % (11/30 1130) Weight:  [331 lb 12.7 oz (150.5 kg)] 331 lb 12.7 oz (150.5 kg) (11/30 0449) HEMODYNAMICS:   VENTILATOR SETTINGS: Vent Mode:  [-] PRVC FiO2 (%):  [50 %-70 %] 50 % Set Rate:  [22 bmp] 22 bmp Vt Set:  [600 mL] 600 mL PEEP:  [14 cmH20] 14 cmH20 Plateau Pressure:  [24 cmH20-30 cmH20] 30 cmH20 INTAKE / OUTPUT:  Intake/Output Summary (Last 24 hours) at 11/09/15 1257 Last data filed at 11/09/15 1200  Gross per 24 hour  Intake 1385.81 ml  Output   1355 ml  Net  30.81 ml    PHYSICAL EXAMINATION: General:  Sedated. Wife at bedside. No distress.  Integument:  Warm & dry. No rash on exposed skin.  HEENT:  No scleral injection or icterus. Endotracheal tube in place. PERRL. Cardiovascular:  Regular rate. Unable to appreciate JVD given body habitus.  Pulmonary:  Coarse breath sounds bilaterally unchanged. Symmetric chest wall movement on ventilator. Abdomen: Soft. Normal bowel sounds. Protuberant. Neurological:  Sedated. Pupils equal and reactive.   LABS:  CBC  Recent Labs Lab 11/07/15 1935 11/08/15 0500 11/09/15 0450  WBC 34.5* 26.9* 24.9*  HGB 13.9 11.4* 11.5*  HCT 44.7 37.0* 36.1*  PLT 251 267 253   Coag's  Recent Labs Lab 11/07/15 2106 11/07/15 2311  APTT  --  34  INR 1.48  --    BMET  Recent Labs Lab 11/07/15 1935 11/08/15 0500 11/09/15 0450  NA 136 142 143  K 5.0 4.1 3.7  CL 99* 99* 100*  CO2 25 33* 34*  BUN 45* 50* 60*  CREATININE 2.00* 1.82* 1.76*  GLUCOSE 106* 151* 137*   Electrolytes  Recent Labs Lab 11/07/15 1935 11/08/15 0500 11/09/15 0450  CALCIUM 7.6* 7.4* 7.7*  MG 1.8  --  2.8*  PHOS  --   --  3.1   Sepsis Markers  Recent Labs Lab 11/07/15 1935 11/07/15 2311 11/08/15 0723 11/08/15 1310 11/09/15 0450  LATICACIDVEN 2.1*  --  1.8 1.6  --   PROCALCITON  --   17.42  --   --  10.74   ABG  Recent Labs Lab 11/07/15 2125 11/07/15 2250 11/08/15 0400  PHART 7.199* 7.260* 7.390  PCO2ART 74.2* 63.1* 51.2*  PO2ART 94.5 54.2* 93.6   Liver Enzymes  Recent Labs Lab 11/07/15 1935 11/09/15 0450  AST 69*  --   ALT 42  --   ALKPHOS 171*  --   BILITOT 1.7*  --   ALBUMIN 2.0* 1.6*   Cardiac Enzymes  Recent Labs Lab 11/07/15 2311 11/08/15 0550 11/08/15 1310  TROPONINI 0.27* 0.14* 0.06*   Glucose  Recent Labs Lab 11/08/15 1137 11/08/15 1643 11/08/15 1953 11/09/15 0010 11/09/15 0322 11/09/15 0734  GLUCAP 140* 105* 108* 132* 131* 118*    Imaging Dg Chest Port 1 View  11/08/2015  CLINICAL DATA:  Ventilator dependent respiratory failure. Re-evaluate position of the right jugular central venous catheter which may have moved during intubation and orogastric tube placement. Followup right lung atelectasis/pneumonia. EXAM: PORTABLE CHEST 1 VIEW COMPARISON:  Portable chest x-ray yesterday. CT chest earlier same date. FINDINGS: Endotracheal tube tip in satisfactory position projecting approximately 3 cm above the carina. Right jugular central venous catheter tip projects over the junction of the innominate vein and SVC, unchanged since yesterday. OG tube courses below the diaphragm. Dense airspace consolidation throughout the right lung associated with air bronchograms and mild volume loss, not significantly changed since yesterday. Left lung remains essentially clear. IMPRESSION: 1. Support apparatus satisfactory. 2. Stable dense pneumonia and mild volume loss throughout the right lung since yesterday. 3. No new abnormalities. Electronically Signed   By: Hulan Saashomas  Lawrence M.D.   On: 11/08/2015 17:01   Dg Abd Portable 1v  11/08/2015  CLINICAL DATA:  Orogastric tube placement EXAM: PORTABLE ABDOMEN - 1 VIEW COMPARISON:  11/07/2015 FINDINGS: Stable endotracheal tube. Right jugular venous catheter. Tip of the NG tube is in the proximal duodenum. There  is consolidation of nearly entire right lung with some aeration at the right apex. Left basilar atelectasis. Normal heart size. Lung apices not included. IMPRESSION: NG tube tip is in the proximal duodenum. Stable extensive consolidation throughout the right lung. Electronically Signed   By: Jolaine ClickArthur  Hoss M.D.   On: 11/08/2015 20:46     ASSESSMENT / PLAN:  PULMONARY OETT 11/28 >>11/29 (self-extubation)>>11/29 (reintubation by JN) A: Acute Hypoxic Respiratory Failure - Secondary to Severe CAP Acute Hypercarbic Respiratory Failure  Severe CAP  P:   Full vent support VAP  Duoneb q6hr Holding on diuresis given Acute Renal Failure  CARDIOVASCULAR R IJ CVL 11/28>> A:  Septic Shock - Shock has resolved.  P:  TTE poor quality to evaluate wall motion Monitoring on telemetry Troponin I improving  RENAL A:   Acute Renal Failure - Improving.  P:   Trending UOP with foley catheter Trending renal function with daily BUN/Creatinine Avoiding nephrotoxic agents  GASTROINTESTINAL A:   No acute issues  P:   Protonix IV daily Tube feedings per dietary recommendations  HEMATOLOGIC A:   Leukocytosis - Secondary to Sepsis. Improving.  P:  Trending Leukocyte count daily with CBC SCDs Heparin [place] q8hr  INFECTIOUS A:   Septic Shock - Secondary to Severe CAP Severe CAP - Treated as outpt with Levaquin.  P:    Day #3 of Zosyn, Vancomycin, & Azithromycin Procalcitonin algorithm  Awaiting Culture & Urine study results  ENDOCRINE A:   Prediabetes in 2013  - Hgb A1c 6.5 11/28  P:   Accu-Checks q4hr  NEUROLOGIC A:   Sedation on Ventilator  P:   RASS goal: -1 Versed IV prn Fentanyl IV prn Fentanyl gtt Holding on sedation vacation until FiO2 requirement improves dramatically   FAMILY  - Updates: Wife updated at bedside by JN on 11/30.  - Inter-disciplinary family meet or Palliative Care meeting due by:  12/5   TODAY'S SUMMARY: 59 year old morbidly obese male  admitted with severe community acquired pneumonia and acute hypoxic respiratory failure. FiO2 weaning successfully. Patient reintubated last night after he self extubated with significant desaturation.  I have spent a total of 34 minutes of critical care time today independently of the resident's time caring for the patient, updating the patient's wife at bedside, & reviewing the patient's electronic medical record.  Donna Christen Jamison Neighbor, M.D. [facility] Pulmonary & Critical Care Pager:  [phone] After 3pm or if no response, call [phone]  11/09/2015 12:57 PM

## 2015-11-10 LAB — CBC WITH DIFFERENTIAL/PLATELET
BASOS ABS: 0 10*3/uL (ref 0.0–0.1)
Basophils Relative: 0 %
EOS ABS: 0 10*3/uL (ref 0.0–0.7)
Eosinophils Relative: 0 %
HCT: 35.4 % — ABNORMAL LOW (ref 39.0–52.0)
HEMOGLOBIN: 11.3 g/dL — AB (ref 13.0–17.0)
LYMPHS PCT: 10 %
Lymphs Abs: 1.7 10*3/uL (ref 0.7–4.0)
MCH: 28.3 pg (ref 26.0–34.0)
MCHC: 31.9 g/dL (ref 30.0–36.0)
MCV: 88.7 fL (ref 78.0–100.0)
MONOS PCT: 3 %
Monocytes Absolute: 0.5 10*3/uL (ref 0.1–1.0)
NEUTROS PCT: 87 %
Neutro Abs: 15.1 10*3/uL — ABNORMAL HIGH (ref 1.7–7.7)
PLATELETS: 253 10*3/uL (ref 150–400)
RBC: 3.99 MIL/uL — AB (ref 4.22–5.81)
RDW: 15.4 % (ref 11.5–15.5)
WBC: 17.3 10*3/uL — AB (ref 4.0–10.5)

## 2015-11-10 LAB — RESPIRATORY VIRUS PANEL
ADENOVIRUS: NEGATIVE
Influenza A: NEGATIVE
Influenza B: NEGATIVE
Metapneumovirus: NEGATIVE
PARAINFLUENZA 2 A: NEGATIVE
PARAINFLUENZA 3 A: NEGATIVE
Parainfluenza 1: NEGATIVE
RHINOVIRUS: NEGATIVE
Respiratory Syncytial Virus A: NEGATIVE
Respiratory Syncytial Virus B: NEGATIVE

## 2015-11-10 LAB — RENAL FUNCTION PANEL
ALBUMIN: 1.5 g/dL — AB (ref 3.5–5.0)
Anion gap: 9 (ref 5–15)
BUN: 57 mg/dL — AB (ref 6–20)
CHLORIDE: 104 mmol/L (ref 101–111)
CO2: 32 mmol/L (ref 22–32)
CREATININE: 1.6 mg/dL — AB (ref 0.61–1.24)
Calcium: 7.7 mg/dL — ABNORMAL LOW (ref 8.9–10.3)
GFR calc Af Amer: 54 mL/min — ABNORMAL LOW (ref >60)
GFR, EST NON AFRICAN AMERICAN: 47 mL/min — AB (ref >60)
GLUCOSE: 153 mg/dL — AB (ref 65–99)
Phosphorus: 4.3 mg/dL (ref 2.5–4.6)
Potassium: 3.9 mmol/L (ref 3.5–5.1)
Sodium: 145 mmol/L (ref 135–145)

## 2015-11-10 LAB — MAGNESIUM: MAGNESIUM: 2.9 mg/dL — AB (ref 1.7–2.4)

## 2015-11-10 LAB — GLUCOSE, CAPILLARY
GLUCOSE-CAPILLARY: 133 mg/dL — AB (ref 65–99)
Glucose-Capillary: 125 mg/dL — ABNORMAL HIGH (ref 65–99)

## 2015-11-10 LAB — VANCOMYCIN, TROUGH: VANCOMYCIN TR: 7 ug/mL — AB (ref 10.0–20.0)

## 2015-11-10 MED ORDER — VANCOMYCIN HCL IN DEXTROSE 1-5 GM/200ML-% IV SOLN
1000.0000 mg | Freq: Two times a day (BID) | INTRAVENOUS | Status: DC
  Administered 2015-11-10 – 2015-11-13 (×6): 1000 mg via INTRAVENOUS
  Filled 2015-11-10 (×7): qty 200

## 2015-11-10 MED ORDER — ACETAMINOPHEN 160 MG/5ML PO SOLN: 650.0000 mg | Freq: Four times a day (QID) | ORAL | Status: DC | PRN

## 2015-11-10 MED ORDER — DEXTROSE 50 % IV SOLN
INTRAVENOUS | Status: AC
  Filled 2015-11-10: qty 50

## 2015-11-10 MED ORDER — CHLORHEXIDINE GLUCONATE CLOTH 2 % EX PADS
6.0000 | MEDICATED_PAD | Freq: Every day | CUTANEOUS | Status: AC
  Administered 2015-11-11 – 2015-11-15 (×5): 6 via TOPICAL

## 2015-11-10 MED ORDER — PANTOPRAZOLE SODIUM 40 MG PO PACK
40.0000 mg | PACK | Freq: Every day | ORAL | Status: DC
  Administered 2015-11-10 – 2015-11-15 (×6): 40 mg
  Filled 2015-11-10 (×7): qty 20

## 2015-11-10 MED ORDER — MUPIROCIN 2 % EX OINT
1.0000 | TOPICAL_OINTMENT | Freq: Two times a day (BID) | CUTANEOUS | Status: AC
Start: 2015-11-10 — End: 2015-11-14
  Administered 2015-11-10 – 2015-11-14 (×10): 1 via NASAL
  Filled 2015-11-10: qty 22

## 2015-11-10 NOTE — Progress Notes (Signed)
Sputum sample obtained and sent down to main lab without any complications.  

## 2015-11-10 NOTE — Progress Notes (Signed)
PULMONARY / CRITICAL CARE MEDICINE   Name: Kenneth Casey MRN: [id number] DOB: 1956/03/22    ADMISSION DATE:  11/07/2015 CONSULTATION DATE:   11/07/15  REFERRING MD :  [facility]  CHIEF COMPLAINT:  Hypercarbic respiratory failure  INITIAL PRESENTATION: Kenneth Casey is a 59 y/o male with a h/o COPD who presented to [facility] with a 1 week history of cough, fevers, and worsening SOB found to be hypoxic to 86% on RA with an initial lactic acid of 3.6 and BNP of 703. He eventually required intubation for hypercarbic respiratory failure and was stated on Levophed for hypotension.    STUDIES:  CXR 11/28: Almost complete opacification of the R lung with air bronchograms.  CT Chest w/o 11/29:  Personally reviewed by me. Dense right lung opacity with air bronchograms consistent with consolidation. TTE 11/29:  Extremely limited. Mild LVH with dilation. EF 55-60%. Grade 1 diastolic dysfunction. Cannot r/o wall motion abnormality. RV poorly visualized. No aortic stenosis or regurg. No mitral stenosis or regurg. No effusion.  SIGNIFICANT EVENTS: 11/28 - Respiratory failure at [facility], intubated, started on Levo 11/28 - Transferred to [facility] 11/29 - Self extubated & reintubated by JN  MICROBIOLOGY  Blood Ctx 12/1>> Urine Ctx 12/1>> Trach Aspirate 12/1>> Trach Aspirate 11/30>> Blood culture [place] 11/28 >> Sputum Grm Stain [place] 11/28:  Many WBC Urine Strep Ag 11/28:  Positive Urine Legionella Ag 11/28:  Negative Respiratory Viral Panel PCR 11/28:  Negative HIV 11/29:  Negative  ANTIBIOTICS Zosyn 11/28>> Vancomycin 11/28>>  Azithromycin 11/28 - 12/1 Tamiflu 11/28 - 12/1 Levaquin 11/28 - 11/28 Ceftriaxone 11/28 - 11/28   LINES/TUBES: R IJ CVL 11/28>> Foley 11/28>> OETT 11/28 >>11/29 (self-extubation)>>11/29 (reintubation by JN) R Radial Art Line 11/28 - 12/1  SUBJECTIVE: No acute events overnight. Having BM. Weaning FiO2 well.  REVIEW OF SYSTEMS:   Unable to obtain due to intubation & sedation.  VITAL SIGNS: Temp:  [97.8 F (36.6 C)-102.1 F (38.9 C)] 102.1 F (38.9 C) (12/01 0812) Pulse Rate:  [88-104] 104 (12/01 0900) Resp:  [20-22] 22 (12/01 0900) BP: (115-148)/(59-73) 141/70 mmHg (12/01 0900) SpO2:  [91 %-97 %] 91 % (12/01 0900) FiO2 (%):  [40 %-50 %] 40 % (12/01 0716) Weight:  [331 lb 12.7 oz (150.5 kg)] 331 lb 12.7 oz (150.5 kg) (12/01 0500) HEMODYNAMICS: CVP:  [12 mmHg-15 mmHg] 13 mmHg VENTILATOR SETTINGS: Vent Mode:  [-] PRVC FiO2 (%):  [40 %-50 %] 40 % Set Rate:  [22 bmp] 22 bmp Vt Set:  [600 mL] 600 mL PEEP:  [14 cmH20] 14 cmH20 Plateau Pressure:  [22 cmH20-30 cmH20] 29 cmH20 INTAKE / OUTPUT:  Intake/Output Summary (Last 24 hours) at 11/10/15 1108 Last data filed at 11/10/15 0800  Gross per 24 hour  Intake 1761.58 ml  Output   1832 ml  Net -70.42 ml    PHYSICAL EXAMINATION: General:  Sedated. Opens eyes. Wife at bedside. Integument:  Warm & dry. No rash on exposed skin.  HEENT:  No scleral injection. Endotracheal tube in place. PERRL. Cardiovascular:  Regular rate. Unable to appreciate JVD given body habitus.  Pulmonary:  Coarse breath sounds bilaterally on auscultation. Symmetric chest wall movement on ventilator. Abdomen: Soft. Normal bowel sounds. Protuberant. Neurological:  Sedated. Pupils equal and reactive. Not following commands. Opens eyes to voice.  LABS:  CBC  Recent Labs Lab 11/08/15 0500 11/09/15 0450 11/10/15 0439  WBC 26.9* 24.9* 17.3*  HGB 11.4* 11.5* 11.3*  HCT 37.0* 36.1* 35.4*  PLT 267 253 253  Coag's  Recent Labs Lab 11/07/15 2106 11/07/15 2311  APTT  --  34  INR 1.48  --    BMET  Recent Labs Lab 11/08/15 0500 11/09/15 0450 11/10/15 0800  NA 142 143 145  K 4.1 3.7 3.9  CL 99* 100* 104  CO2 33* 34* 32  BUN 50* 60* 57*  CREATININE 1.82* 1.76* 1.60*  GLUCOSE 151* 137* 153*   Electrolytes  Recent Labs Lab 11/07/15 1935 11/08/15 0500 11/09/15 0450  11/10/15 0439 11/10/15 0800  CALCIUM 7.6* 7.4* 7.7*  --  7.7*  MG 1.8  --  2.8* 2.9*  --   PHOS  --   --  3.1  --  4.3   Sepsis Markers  Recent Labs Lab 11/07/15 1935 11/07/15 2311 11/08/15 0723 11/08/15 1310 11/09/15 0450  LATICACIDVEN 2.1*  --  1.8 1.6  --   PROCALCITON  --  17.42  --   --  10.74   ABG  Recent Labs Lab 11/07/15 2125 11/07/15 2250 11/08/15 0400  PHART 7.199* 7.260* 7.390  PCO2ART 74.2* 63.1* 51.2*  PO2ART 94.5 54.2* 93.6   Liver Enzymes  Recent Labs Lab 11/07/15 1935 11/09/15 0450 11/10/15 0800  AST 69*  --   --   ALT 42  --   --   ALKPHOS 171*  --   --   BILITOT 1.7*  --   --   ALBUMIN 2.0* 1.6* 1.5*   Cardiac Enzymes  Recent Labs Lab 11/07/15 2311 11/08/15 0550 11/08/15 1310  TROPONINI 0.27* 0.14* 0.06*   Glucose  Recent Labs Lab 11/09/15 0010 11/09/15 0322 11/09/15 0734 11/09/15 1105 11/09/15 1519 11/09/15 2017  GLUCAP 132* 131* 118* 158* 110* 130*    Imaging No results found.   ASSESSMENT / PLAN:  PULMONARY A: Acute Hypoxic Respiratory Failure - Secondary to Severe CAP Acute Hypercarbic Respiratory Failure  Severe CAP  P:   Full vent support VAP  Duoneb q6hr Holding on diuresis given Acute Renal Failure  CARDIOVASCULAR A:  Septic Shock - Shock has resolved.  P:  TTE poor quality to evaluate wall motion Monitoring on telemetry Troponin I improving D/C arterial line  RENAL A:   Acute Renal Failure - Improving.  P:   Trending UOP with foley catheter Trending renal function with daily BUN/Creatinine Avoiding nephrotoxic agents  GASTROINTESTINAL A:   No acute issues   P:   Protonix via tube daily Tube feedings per dietary recommendations  HEMATOLOGIC A:   Leukocytosis - Secondary to Sepsis. Improving.  P:  Trending Leukocyte count daily with CBC SCDs Heparin [place] q8hr  INFECTIOUS A:   Septic Shock - Secondary to Severe CAP Severe CAP - Treated as outpt with Levaquin.  P:    Day  #4 of Zosyn, Vancomycin, & Azithromycin D/C Azithromycin D/C droplet isolation & Tamiflu Repeat Blood, Urine, & Respiratory cultures today for fever  ENDOCRINE A:   Prediabetes in 2013  - BG stable on tube feedings. Hgb A1c 6.5 11/28  P:   D/C Accu-Checks  Monitoring BG on daily BMP  NEUROLOGIC A:   Sedation on Ventilator Encephalopathy - secondary to sedation & toxic metabolic encephalopathy.  P:   RASS goal: -1 Versed IV prn Fentanyl IV prn Fentanyl gtt Holding on sedation vacation until FiO2 requirement improves dramatically   FAMILY  - Updates: Wife updated at bedside by JN on 12/1.  - Inter-disciplinary family meet or Palliative Care meeting due by:  12/5   TODAY'S SUMMARY: 59 year old morbidly obese male admitted  with severe community acquired pneumonia and acute hypoxic respiratory failure. Continuing to wean ventilator support. Plan to wean PEEP by 2 every 4-6 hours as saturations tolerate. Continuing tube feedings. Re-culturing today for fever.  I have spent a total of 32 minutes of critical care time today independently of the resident's time caring for the patient, updating the patient's wife at bedside, & reviewing the patient's electronic medical record.  Donna Christen Jamison Neighbor, M.D. [facility] Pulmonary & Critical Care Pager:  [phone] After 3pm or if no response, call [phone]  11/10/2015 11:08 AM

## 2015-11-10 NOTE — Progress Notes (Addendum)
ANTIBIOTIC CONSULT NOTE - FOLLOW UP  Pharmacy Consult for Vanc/Zosyn Indication: pneumonia  No Known Allergies  Patient Measurements: Height: 5\' 10"  (177.8 cm) Weight: (!) 331 lb 12.7 oz (150.5 kg) IBW/kg (Calculated) : 73  Vital Signs: Temp: 102.1 F (38.9 C) (12/01 0812) Temp Source: Oral (12/01 0812) BP: 141/70 mmHg (12/01 0900) Pulse Rate: 104 (12/01 0900) Intake/Output from previous day: 11/30 0701 - 12/01 0700 In: 2681.6 [I.V.:901.6; NG/GT:1180; IV Piggyback:600] Out: 1782 [Urine:1780; Stool:2] Intake/Output from this shift: Total I/O In: -  Out: 350 [Urine:350]  Labs:  Recent Labs  11/08/15 0500 11/09/15 0450 11/10/15 0439 11/10/15 0800  WBC 26.9* 24.9* 17.3*  --   HGB 11.4* 11.5* 11.3*  --   PLT 267 253 253  --   CREATININE 1.82* 1.76*  --  1.60*   Estimated Creatinine Clearance: 75.8 mL/min (by C-G formula based on Cr of 1.6).  Recent Labs  11/10/15 1015  VANCOTROUGH 7*     Microbiology: Recent Results (from the past 720 hour(s))  MRSA PCR Screening     Status: Abnormal   Collection Time: 11/07/15  7:02 PM  Result Value Ref Range Status   MRSA by PCR POSITIVE (A) NEGATIVE Final    Comment:        The GeneXpert MRSA Assay (FDA approved for NASAL specimens only), is one component of a comprehensive MRSA colonization surveillance program. It is not intended to diagnose MRSA infection nor to guide or monitor treatment for MRSA infections. RESULT CALLED TO, READ BACK BY AND VERIFIED WITH: D HOVANDER RN 2036 11/07/15 A BROWNING   Culture, blood (routine x 2)     Status: None (Preliminary result)   Collection Time: 11/07/15  7:35 PM  Result Value Ref Range Status   Specimen Description BLOOD LEFT ANTECUBITAL  Final   Special Requests BOTTLES DRAWN AEROBIC AND ANAEROBIC 7CC  Final   Culture NO GROWTH 2 DAYS  Final   Report Status PENDING  Incomplete  Culture, blood (routine x 2)     Status: None (Preliminary result)   Collection Time:  11/07/15  7:49 PM  Result Value Ref Range Status   Specimen Description BLOOD LEFT ARM  Final   Special Requests IN PEDIATRIC BOTTLE 2CC  Final   Culture NO GROWTH 2 DAYS  Final   Report Status PENDING  Incomplete  Respiratory virus panel     Status: None   Collection Time: 11/07/15 11:01 PM  Result Value Ref Range Status   Source - RVPAN NASOPHARYNGEAL  Corrected   Respiratory Syncytial Virus A Negative Negative Final   Respiratory Syncytial Virus B Negative Negative Final   Influenza A Negative Negative Final   Influenza B Negative Negative Final   Parainfluenza 1 Negative Negative Final   Parainfluenza 2 Negative Negative Final   Parainfluenza 3 Negative Negative Final   Metapneumovirus Negative Negative Final   Rhinovirus Negative Negative Final   Adenovirus Negative Negative Final    Comment: (NOTE) Performed At: [facility] [address] [place], [place] [id number] Mila HomerHancock William F MD MV:[id number]:[phone]   Gram stain     Status: None   Collection Time: 11/08/15 12:19 AM  Result Value Ref Range Status   Specimen Description TRACHEAL ASPIRATE  Final   Special Requests Normal  Final   Gram Stain   Final    ABUNDANT WBC PRESENT,BOTH PMN AND MONONUCLEAR CORRECTED ON 11/29 AT 0936: PREVIOUSLY REPORTED AS MODERATE WBC PRESENT, PREDOMINANTLY MONONUCLEAR NO ORGANISMS SEEN CORRECTED RESULTS CALLED TO: L  ROBBINS,RN AT 0940 11/08/15 BY L BENFIELD    Report Status 11/08/2015 FINAL  Final  Culture, respiratory (NON-Expectorated)     Status: None (Preliminary result)   Collection Time: 11/09/15 10:32 AM  Result Value Ref Range Status   Specimen Description TRACHEAL ASPIRATE  Final   Special Requests Normal  Final   Gram Stain   Final    ABUNDANT WBC PRESENT,BOTH PMN AND MONONUCLEAR FEW SQUAMOUS EPITHELIAL CELLS PRESENT NO ORGANISMS SEEN Performed at [facility]    Culture   Final    NO GROWTH 1 DAY Performed at [facility]    Report Status PENDING   Incomplete    Anti-infectives    Start     Dose/Rate Route Frequency Ordered Stop   11/08/15 1130  vancomycin (VANCOCIN) IVPB 1000 mg/200 mL premix  Status:  Discontinued     1,000 mg 200 mL/hr over 60 Minutes Intravenous Every 12 hours 11/08/15 0956 11/09/15 1545   11/08/15 1000  levofloxacin (LEVAQUIN) IVPB 750 mg  Status:  Discontinued     750 mg 100 mL/hr over 90 Minutes Intravenous Every 24 hours 11/07/15 1952 11/07/15 2157   11/08/15 0800  azithromycin (ZITHROMAX) 500 mg in dextrose 5 % 250 mL IVPB  Status:  Discontinued     500 mg 250 mL/hr over 60 Minutes Intravenous Every 24 hours 11/07/15 2157 11/10/15 1033   11/07/15 2300  oseltamivir (TAMIFLU) 6 MG/ML suspension 75 mg  Status:  Discontinued     75 mg Oral 2 times daily 11/07/15 2244 11/10/15 1033   11/07/15 2200  vancomycin (VANCOCIN) 2,000 mg in sodium chloride 0.9 % 500 mL IVPB     2,000 mg 250 mL/hr over 120 Minutes Intravenous  Once 11/07/15 2159 11/08/15 0036   11/07/15 2100  piperacillin-tazobactam (ZOSYN) IVPB 3.375 g     3.375 g 12.5 mL/hr over 240 Minutes Intravenous Every 8 hours 11/07/15 2048        Assessment: 59 y/o M presented to OSH w/ acute on chronic hypercapnia that required intubation then transferred to [facility]. Pt reported 1 week hx of cough, fever, and worsening SOB. SCr improved from 2>1.82>1.6. Baseline unknown but SCr prior to transfer was 1.5. UOP has improved. Vanc was on hold due to low UOP. Last dose in the AM on 11/30.   Goal of Therapy:  Vancomycin trough level 15-20 mcg/ml  Plan:  Restart vancomycin IV  q12h Zosyn 3.375g IV q8h D/c azithromycin/oseltamavir Monitor renal function, cultures, LOT, VT at [facility]  Sandi Carne, PharmD Pharmacy Resident Pager: [phone] 11/10/2015,11:36 AM

## 2015-11-10 NOTE — Progress Notes (Signed)
RN called RT to patient room due to patient decreasing in sats to low 80s.  Increased FIO2 to 60%.  Bag lavaged patient, with assistance of RN, and obtained copious amount of sputum.  Patient sats improved to 92%.  Once placed back on ventilator, performed recruitment maneuver with an improvement in sats to 95%.  Will continue to monitor patient.

## 2015-11-11 ENCOUNTER — Inpatient Hospital Stay (HOSPITAL_COMMUNITY): Payer: Self-pay

## 2015-11-11 LAB — RENAL FUNCTION PANEL
ANION GAP: 8 (ref 5–15)
Albumin: 1.4 g/dL — ABNORMAL LOW (ref 3.5–5.0)
BUN: 46 mg/dL — ABNORMAL HIGH (ref 6–20)
CHLORIDE: 104 mmol/L (ref 101–111)
CO2: 32 mmol/L (ref 22–32)
Calcium: 7.8 mg/dL — ABNORMAL LOW (ref 8.9–10.3)
Creatinine, Ser: 1.4 mg/dL — ABNORMAL HIGH (ref 0.61–1.24)
GFR, EST NON AFRICAN AMERICAN: 55 mL/min — AB (ref >60)
Glucose, Bld: 128 mg/dL — ABNORMAL HIGH (ref 65–99)
POTASSIUM: 4.2 mmol/L (ref 3.5–5.1)
Phosphorus: 3.8 mg/dL (ref 2.5–4.6)
Sodium: 144 mmol/L (ref 135–145)

## 2015-11-11 LAB — CBC WITH DIFFERENTIAL/PLATELET
BASOS PCT: 0 %
Basophils Absolute: 0 10*3/uL (ref 0.0–0.1)
EOS PCT: 0 %
Eosinophils Absolute: 0 10*3/uL (ref 0.0–0.7)
HEMATOCRIT: 35.7 % — AB (ref 39.0–52.0)
Hemoglobin: 11.2 g/dL — ABNORMAL LOW (ref 13.0–17.0)
Lymphocytes Relative: 13 %
Lymphs Abs: 2.3 10*3/uL (ref 0.7–4.0)
MCH: 28.1 pg (ref 26.0–34.0)
MCHC: 31.4 g/dL (ref 30.0–36.0)
MCV: 89.7 fL (ref 78.0–100.0)
MONO ABS: 0.7 10*3/uL (ref 0.1–1.0)
Monocytes Relative: 4 %
NEUTROS PCT: 83 %
Neutro Abs: 14.5 10*3/uL — ABNORMAL HIGH (ref 1.7–7.7)
PLATELETS: 274 10*3/uL (ref 150–400)
RBC: 3.98 MIL/uL — ABNORMAL LOW (ref 4.22–5.81)
RDW: 15.4 % (ref 11.5–15.5)
WBC: 17.5 10*3/uL — ABNORMAL HIGH (ref 4.0–10.5)

## 2015-11-11 LAB — GLUCOSE, CAPILLARY
GLUCOSE-CAPILLARY: 120 mg/dL — AB (ref 65–99)
GLUCOSE-CAPILLARY: 112 mg/dL — AB (ref 65–99)
GLUCOSE-CAPILLARY: 122 mg/dL — AB (ref 65–99)
Glucose-Capillary: 128 mg/dL — ABNORMAL HIGH (ref 65–99)
Glucose-Capillary: 116 mg/dL — ABNORMAL HIGH (ref 65–99)
Glucose-Capillary: 122 mg/dL — ABNORMAL HIGH (ref 65–99)
Glucose-Capillary: 131 mg/dL — ABNORMAL HIGH (ref 65–99)
Glucose-Capillary: 135 mg/dL — ABNORMAL HIGH (ref 65–99)
Glucose-Capillary: 132 mg/dL — ABNORMAL HIGH (ref 65–99)

## 2015-11-11 LAB — MAGNESIUM: Magnesium: 2.6 mg/dL — ABNORMAL HIGH (ref 1.7–2.4)

## 2015-11-11 LAB — URINE CULTURE
CULTURE: NO GROWTH
Special Requests: NORMAL

## 2015-11-11 NOTE — Progress Notes (Signed)
PULMONARY / CRITICAL CARE MEDICINE   Name: Kenneth Casey MRN: [id number] DOB: Apr 15, 1956    ADMISSION DATE:  11/07/2015 CONSULTATION DATE:   11/07/15  REFERRING MD :  [facility]  CHIEF COMPLAINT:  Hypercarbic respiratory failure  INITIAL PRESENTATION: Kenneth Casey is a 59 y/o male with a h/o COPD who presented to [facility] with a 1 week history of cough, fevers, and worsening SOB found to be hypoxic to 86% on RA with an initial lactic acid of 3.6 and BNP of 703. He eventually required intubation for hypercarbic respiratory failure and was stated on Levophed for hypotension.    STUDIES:  CXR 11/28: Almost complete opacification of the R lung with air bronchograms.  CT Chest w/o 11/29:  Personally reviewed by me. Dense right lung opacity with air bronchograms consistent with consolidation. TTE 11/29:  Extremely limited. Mild LVH with dilation. EF 55-60%. Grade 1 diastolic dysfunction. Cannot r/o wall motion abnormality. RV poorly visualized. No aortic stenosis or regurg. No mitral stenosis or regurg. No effusion. Port CXR 12/2:  Improving right lung opacity. ETT in good position.   SIGNIFICANT EVENTS: 11/28 - Respiratory failure at [facility], intubated, started on Levo 11/28 - Transferred to [facility] 11/29 - Self extubated & reintubated by JN  MICROBIOLOGY  Blood Ctx 12/1>> Trach Aspirate 12/1>> Trach Aspirate 11/30>> Blood culture [place] 11/28 >> Urine Ctx 12/1:  Negative Sputum Grm Stain [place] 11/28:  Many WBC Urine Strep Ag 11/28:  Positive Urine Legionella Ag 11/28:  Negative Respiratory Viral Panel PCR 11/28:  Negative HIV 11/29:  Negative  ANTIBIOTICS Zosyn 11/28>> Vancomycin 11/28>>  Azithromycin 11/28 - 12/1 Tamiflu 11/28 - 12/1 Levaquin 11/28 - 11/28 Ceftriaxone 11/28 - 11/28   LINES/TUBES: R IJ CVL 11/28>> Foley 11/28>> OETT 11/28 >>11/29 (self-extubation)>>11/29 (reintubation by JN) R Radial Art Line 11/28 - 12/1  SUBJECTIVE:  Mucus plugging overnight. Increased FiO2 requirement.  REVIEW OF SYSTEMS:  Unable to obtain due to intubation & sedation.  VITAL SIGNS: Temp:  [99.2 F (37.3 C)-100 F (37.8 C)] 99.2 F (37.3 C) (12/02 1529) Pulse Rate:  [81-106] 100 (12/02 1600) Resp:  [19-28] 22 (12/02 1600) BP: (114-170)/(69-86) 132/76 mmHg (12/02 1600) SpO2:  [88 %-97 %] 88 % (12/02 1600) FiO2 (%):  [45 %-60 %] 45 % (12/02 1541) Weight:  [330 lb 4 oz (149.8 kg)] 330 lb 4 oz (149.8 kg) (12/02 0353) HEMODYNAMICS: CVP:  [10 mmHg-13 mmHg] 10 mmHg VENTILATOR SETTINGS: Vent Mode:  [-] PRVC FiO2 (%):  [45 %-60 %] 45 % Set Rate:  [22 bmp] 22 bmp Vt Set:  [600 mL] 600 mL PEEP:  [14 cmH20] 14 cmH20 Plateau Pressure:  [26 cmH20-29 cmH20] 28 cmH20 INTAKE / OUTPUT:  Intake/Output Summary (Last 24 hours) at 11/11/15 1645 Last data filed at 11/11/15 1600  Gross per 24 hour  Intake 3437.77 ml  Output   3350 ml  Net  87.77 ml    PHYSICAL EXAMINATION: General:  Sedated. No distress. Daughter & wife at bedside. Integument:  Warm & dry. No rash on exposed skin.  HEENT:  No scleral icterus. Endotracheal tube in place. PERRL. Cardiovascular:  Regular rate. Unable to appreciate JVD given body habitus.  Pulmonary:  Coarse breath sounds bilaterally on auscultation but good aeration bilaterally. Symmetric chest wall movement on ventilator. Abdomen: Soft. Normal bowel sounds. Protuberant. Neurological:  Sedated. Pupils equal and reactive. Not following commands. Opens eyes to voice.  LABS:  CBC  Recent Labs Lab 11/09/15 0450 11/10/15 0439 11/11/15 0401  WBC 24.9* 17.3* 17.5*  HGB 11.5* 11.3* 11.2*  HCT 36.1* 35.4* 35.7*  PLT 253 253 274   Coag's  Recent Labs Lab 11/07/15 2106 11/07/15 2311  APTT  --  34  INR 1.48  --    BMET  Recent Labs Lab 11/09/15 0450 11/10/15 0800 11/11/15 0348  NA 143 145 144  K 3.7 3.9 4.2  CL 100* 104 104  CO2 34* 32 32  BUN 60* 57* 46*  CREATININE 1.76* 1.60* 1.40*   GLUCOSE 137* 153* 128*   Electrolytes  Recent Labs Lab 11/09/15 0450 11/10/15 0439 11/10/15 0800 11/11/15 0348 11/11/15 0401  CALCIUM 7.7*  --  7.7* 7.8*  --   MG 2.8* 2.9*  --   --  2.6*  PHOS 3.1  --  4.3 3.8  --    Sepsis Markers  Recent Labs Lab 11/07/15 1935 11/07/15 2311 11/08/15 0723 11/08/15 1310 11/09/15 0450  LATICACIDVEN 2.1*  --  1.8 1.6  --   PROCALCITON  --  17.42  --   --  10.74   ABG  Recent Labs Lab 11/07/15 2125 11/07/15 2250 11/08/15 0400  PHART 7.199* 7.260* 7.390  PCO2ART 74.2* 63.1* 51.2*  PO2ART 94.5 54.2* 93.6   Liver Enzymes  Recent Labs Lab 11/07/15 1935 11/09/15 0450 11/10/15 0800 11/11/15 0348  AST 69*  --   --   --   ALT 42  --   --   --   ALKPHOS 171*  --   --   --   BILITOT 1.7*  --   --   --   ALBUMIN 2.0* 1.6* 1.5* 1.4*   Cardiac Enzymes  Recent Labs Lab 11/07/15 2311 11/08/15 0550 11/08/15 1310  TROPONINI 0.27* 0.14* 0.06*   Glucose  Recent Labs Lab 11/10/15 1517 11/10/15 2027 11/10/15 2318 11/11/15 0723 11/11/15 1108 11/11/15 1527  GLUCAP 133* 125* 120* 112* 128* 132*    Imaging Dg Chest Port 1 View  11/11/2015  CLINICAL DATA:  Respiratory failure.  Hypoxia. EXAM: PORTABLE CHEST 1 VIEW COMPARISON:  Chest x-ray dated 11/08/2015. FINDINGS: Endotracheal tube remains well positioned with tip approximately 3 cm above the level of the carina. Enteric tube passes below the diaphragm. Right-sided internal jugular central line is stable in position with tip in the expected region of the right brachiocephalic vein. There is improved aeration within the right lung. Left lung remains relatively clear, perhaps mild edema and/ or atelectasis at the left lung base. No pneumothorax. IMPRESSION: 1. Diffuse airspace opacities throughout the right lung, but with improved aeration compared to the previous chest x-ray of 11/08/2015 suggesting clearing pneumonia and/or edema. 2.  Mild edema and/or atelectasis at the left lung  base. 3.  Tubes and lines stable in position. Electronically Signed   By: Bary RichardStan  Maynard M.D.   On: 11/11/2015 12:13     ASSESSMENT / PLAN:  PULMONARY A: Acute Hypoxic Respiratory Failure - Secondary to Severe CAP Acute Hypercarbic Respiratory Failure  Severe CAP  P:   Full vent support VAP  Duoneb q6hr Starting Chest PT q4hr via bed Holding on diuresis given Acute Renal Failure  CARDIOVASCULAR A:  Septic Shock - Shock has resolved.  P:  TTE poor quality to evaluate wall motion Monitoring on telemetry  RENAL A:   Acute Renal Failure - Improving.  P:   Trending UOP with foley catheter Trending renal function with daily BUN/Creatinine Avoiding nephrotoxic agents  GASTROINTESTINAL A:   No acute issues   P:  Protonix via tube daily Tube feedings per dietary recommendations  HEMATOLOGIC A:   Leukocytosis - Secondary to Sepsis. Improving.  P:  Trending Leukocyte count daily with CBC SCDs Heparin [place] q8hr  INFECTIOUS A:   Septic Shock - Secondary to Severe CAP Severe CAP - Strep Pneumo Ag positive. Treated as outpt with Levaquin.  P:    Day #5 of Zosyn & Vancomycin Awaiting finalization of cultures  ENDOCRINE A:   Prediabetes in 2013  - BG stable on tube feedings. Hgb A1c 6.5 11/28  P:   D/C Accu-Checks  Monitoring BG on daily BMP  NEUROLOGIC A:   Sedation on Ventilator Encephalopathy - secondary to sedation & toxic metabolic encephalopathy.  P:   RASS goal: -1 Versed IV prn Fentanyl IV prn Fentanyl gtt Holding on sedation vacation until FiO2 requirement improves dramatically   FAMILY  - Updates: Wife updated at bedside by [facility] on 12/2.  - Inter-disciplinary family meet or Palliative Care meeting due by:  12/5   TODAY'S SUMMARY: 59 year old morbidly obese male admitted with severe community acquired pneumonia and acute hypoxic respiratory failure. Continuing to wean ventilator support. Seems recovered for mucus plugging overnight. Patient  does have some clearing of the right lung opacity on repeat imaging today. Continuing tube feeds. Further weaning FiO2 with initiation of chest PT.  I have spent a total of 35 minutes of critical care time today independently of the resident's time caring for the patient, updating the patient's wife at bedside, & reviewing the patient's electronic medical record.  Donna Christen Jamison Neighbor, M.D. [facility] Pulmonary & Critical Care Pager:  [phone] After 3pm or if no response, call [phone]  11/11/2015 4:45 PM

## 2015-11-11 NOTE — Care Management Note (Signed)
Case Management Note  Patient Details  Name: Dorena DewHerman Apollo MRN: [id number] Date of Birth: 08/19/1956  Subjective/Objective:     Introduced self to wife and what we do.   Confirmed patient was independent prior - lives with her.  Patient remains on vent.  Will continue to follow for progression.                Action/Plan:   Expected Discharge Date:                  Expected Discharge Plan:  Home/Self Care  In-House Referral:     Discharge planning Services  CM Consult  Post Acute Care Choice:    Choice offered to:     DME Arranged:    DME Agency:     HH Arranged:    HH Agency:     Status of Service:  In process, will continue to follow  Medicare Important Message Given:    Date Medicare IM Given:    Medicare IM give by:    Date Additional Medicare IM Given:    Additional Medicare Important Message give by:     If discussed at Long Length of Stay Meetings, dates discussed:    Additional Comments:  Vangie BickerBrown, Amarise Lillo Jane, RN 11/11/2015, 2:59 PM

## 2015-11-12 DIAGNOSIS — L8990 Pressure ulcer of unspecified site, unspecified stage: Secondary | ICD-10-CM | POA: Insufficient documentation

## 2015-11-12 LAB — RENAL FUNCTION PANEL
ALBUMIN: 1.5 g/dL — AB (ref 3.5–5.0)
Anion gap: 7 (ref 5–15)
BUN: 46 mg/dL — ABNORMAL HIGH (ref 6–20)
CHLORIDE: 109 mmol/L (ref 101–111)
CO2: 32 mmol/L (ref 22–32)
Calcium: 7.9 mg/dL — ABNORMAL LOW (ref 8.9–10.3)
Creatinine, Ser: 1.25 mg/dL — ABNORMAL HIGH (ref 0.61–1.24)
GFR calc Af Amer: >60 mL/min (ref >60)
GFR calc non Af Amer: >60 mL/min (ref >60)
Glucose, Bld: 115 mg/dL — ABNORMAL HIGH (ref 65–99)
POTASSIUM: 4.6 mmol/L (ref 3.5–5.1)
Phosphorus: 3.7 mg/dL (ref 2.5–4.6)
Sodium: 148 mmol/L — ABNORMAL HIGH (ref 135–145)

## 2015-11-12 LAB — CBC WITH DIFFERENTIAL/PLATELET
Basophils Absolute: 0 10*3/uL (ref 0.0–0.1)
Basophils Relative: 0 %
EOS ABS: 0.2 10*3/uL (ref 0.0–0.7)
EOS PCT: 1 %
HCT: 36.3 % — ABNORMAL LOW (ref 39.0–52.0)
Hemoglobin: 11.1 g/dL — ABNORMAL LOW (ref 13.0–17.0)
LYMPHS ABS: 1.8 10*3/uL (ref 0.7–4.0)
Lymphocytes Relative: 9 %
MCH: 28 pg (ref 26.0–34.0)
MCHC: 30.6 g/dL (ref 30.0–36.0)
MCV: 91.7 fL (ref 78.0–100.0)
MONO ABS: 0.8 10*3/uL (ref 0.1–1.0)
Monocytes Relative: 4 %
Neutro Abs: 16.7 10*3/uL — ABNORMAL HIGH (ref 1.7–7.7)
Neutrophils Relative %: 86 %
PLATELETS: 333 10*3/uL (ref 150–400)
RBC: 3.96 MIL/uL — AB (ref 4.22–5.81)
RDW: 15.4 % (ref 11.5–15.5)
WBC: 19.5 10*3/uL — AB (ref 4.0–10.5)

## 2015-11-12 LAB — CULTURE, BLOOD (ROUTINE X 2)
CULTURE: NO GROWTH
Culture: NO GROWTH

## 2015-11-12 LAB — CULTURE, RESPIRATORY

## 2015-11-12 LAB — CULTURE, RESPIRATORY W GRAM STAIN: Special Requests: NORMAL

## 2015-11-12 LAB — MAGNESIUM: Magnesium: 2.5 mg/dL — ABNORMAL HIGH (ref 1.7–2.4)

## 2015-11-12 MED ORDER — CHLORHEXIDINE GLUCONATE 0.12 % MT SOLN
15.0000 mL | Freq: Two times a day (BID) | OROMUCOSAL | Status: DC
Start: 2015-11-12 — End: 2015-11-14
  Administered 2015-11-12 – 2015-11-13 (×4): 15 mL via OROMUCOSAL
  Filled 2015-11-12: qty 15

## 2015-11-12 MED ORDER — FREE WATER
200.0000 mL | Freq: Four times a day (QID) | Status: DC
  Administered 2015-11-12 – 2015-11-13 (×7): 200 mL

## 2015-11-12 NOTE — Progress Notes (Addendum)
PULMONARY / CRITICAL CARE MEDICINE   Name: Tyee Vandevoorde MRN: [id number] DOB: [birth year]    ADMISSION DATE:  11/07/2015 CONSULTATION DATE:   11/07/15  REFERRING MD :  [facility]  CHIEF COMPLAINT:  Hypercarbic respiratory failure  INITIAL PRESENTATION: Mr. Bakos is a 59 y/o male with a h/o COPD who presented to [facility] with a 1 week history of cough, fevers, and worsening SOB found to be hypoxic to 86% on RA with an initial lactic acid of 3.6 and BNP of 703. He eventually required intubation for hypercarbic respiratory failure and was stated on Levophed for hypotension.    STUDIES:  CXR 11/28: Almost complete opacification of the R lung with air bronchograms.  CT Chest w/o 11/29:  Personally reviewed by me. Dense right lung opacity with air bronchograms consistent with consolidation. TTE 11/29:  Extremely limited. Mild LVH with dilation. EF 55-60%. Grade 1 diastolic dysfunction. Cannot r/o wall motion abnormality. RV poorly visualized. No aortic stenosis or regurg. No mitral stenosis or regurg. No effusion. Port CXR 12/2:  Improving right lung opacity. ETT in good position.   SIGNIFICANT EVENTS: 11/28 - Respiratory failure at [facility], intubated, started on Levo 11/28 - Transferred to [facility] 11/29 - Self extubated & reintubated by JN  MICROBIOLOGY  Blood Ctx 12/1>> Trach Aspirate 12/1>> Trach Aspirate 11/30>> Blood culture [place] 11/28 >> Urine Ctx 12/1:  Negative Sputum Grm Stain [place] 11/28:  Many WBC Urine Strep Ag 11/28:  Positive Urine Legionella Ag 11/28:  Negative Respiratory Viral Panel PCR 11/28:  Negative HIV 11/29:  Negative  ANTIBIOTICS Zosyn 11/28>> Vancomycin 11/28>>  Azithromycin 11/28 - 12/1 Tamiflu 11/28 - 12/1 Levaquin 11/28 - 11/28 Ceftriaxone 11/28 - 11/28   LINES/TUBES: R IJ CVL 11/28>> Foley 11/28>> OETT 11/28 >>11/29 (self-extubation)>>11/29 (reintubation by JN) R Radial Art Line 11/28 - 12/1  SUBJECTIVE:  No events overnight. Comfortable this morning.  REVIEW OF SYSTEMS:  Unable to obtain due to intubation.  VITAL SIGNS: Temp:  [99.2 F (37.3 C)-100.6 F (38.1 C)] 100.6 F (38.1 C) (12/03 0735) Pulse Rate:  [83-106] 89 (12/03 1000) Resp:  [19-23] 22 (12/03 1000) BP: (113-150)/(67-82) 134/71 mmHg (12/03 1000) SpO2:  [88 %-97 %] 93 % (12/03 1000) FiO2 (%):  [45 %-60 %] 50 % (12/03 0800) Weight:  [330 lb 11 oz (150 kg)] 330 lb 11 oz (150 kg) (12/03 0423) HEMODYNAMICS:   VENTILATOR SETTINGS: Vent Mode:  [-] PRVC FiO2 (%):  [45 %-60 %] 50 % Set Rate:  [22 bmp] 22 bmp Vt Set:  [600 mL] 600 mL PEEP:  [14 cmH20] 14 cmH20 Plateau Pressure:  [25 cmH20-29 cmH20] 28 cmH20 INTAKE / OUTPUT:  Intake/Output Summary (Last 24 hours) at 11/12/15 1041 Last data filed at 11/12/15 1000  Gross per 24 hour  Intake   3435 ml  Output   3100 ml  Net    335 ml    PHYSICAL EXAMINATION: General:  Awake. Eyes open. Wife at bedside. Integument:  Warm & dry. No rash on exposed skin.  HEENT:  No scleral icterus. Endotracheal tube in place. PERRL. Cardiovascular:  Regular rate. Unable to appreciate JVD given body habitus. No edema. Pulmonary:  Coarse breath sounds bilaterally on auscultation but good aeration bilaterally. Symmetric chest wall movement on ventilator. Abdomen: Soft. Normal bowel sounds. Protuberant. Neurological:  Following commands. Nods to questions.  LABS:  CBC  Recent Labs Lab 11/10/15 0439 11/11/15 0401 11/12/15 0425  WBC 17.3* 17.5* 19.5*  HGB 11.3* 11.2* 11.1*  HCT  35.4* 35.7* 36.3*  PLT 253 274 333   Coag's  Recent Labs Lab 11/07/15 2106 11/07/15 2311  APTT  --  34  INR 1.48  --    BMET  Recent Labs Lab 11/10/15 0800 11/11/15 0348 11/12/15 0425  NA 145 144 148*  K 3.9 4.2 4.6  CL 104 104 109  CO2 32 32 32  BUN 57* 46* 46*  CREATININE 1.60* 1.40* 1.25*  GLUCOSE 153* 128* 115*   Electrolytes  Recent Labs Lab 11/10/15 0439 11/10/15 0800  11/11/15 0348 11/11/15 0401 11/12/15 0425  CALCIUM  --  7.7* 7.8*  --  7.9*  MG 2.9*  --   --  2.6* 2.5*  PHOS  --  4.3 3.8  --  3.7   Sepsis Markers  Recent Labs Lab 11/07/15 1935 11/07/15 2311 11/08/15 0723 11/08/15 1310 11/09/15 0450  LATICACIDVEN 2.1*  --  1.8 1.6  --   PROCALCITON  --  17.42  --   --  10.74   ABG  Recent Labs Lab 11/07/15 2125 11/07/15 2250 11/08/15 0400  PHART 7.199* 7.260* 7.390  PCO2ART 74.2* 63.1* 51.2*  PO2ART 94.5 54.2* 93.6   Liver Enzymes  Recent Labs Lab 11/07/15 1935  11/10/15 0800 11/11/15 0348 11/12/15 0425  AST 69*  --   --   --   --   ALT 42  --   --   --   --   ALKPHOS 171*  --   --   --   --   BILITOT 1.7*  --   --   --   --   ALBUMIN 2.0*  < > 1.5* 1.4* 1.5*  < > = values in this interval not displayed. Cardiac Enzymes  Recent Labs Lab 11/07/15 2311 11/08/15 0550 11/08/15 1310  TROPONINI 0.27* 0.14* 0.06*   Glucose  Recent Labs Lab 11/10/15 2027 11/10/15 2318 11/11/15 0723 11/11/15 1108 11/11/15 1527 11/11/15 1932  GLUCAP 125* 120* 112* 128* 132* 122*    Imaging Dg Chest Port 1 View  11/11/2015  CLINICAL DATA:  Respiratory failure.  Hypoxia. EXAM: PORTABLE CHEST 1 VIEW COMPARISON:  Chest x-ray dated 11/08/2015. FINDINGS: Endotracheal tube remains well positioned with tip approximately 3 cm above the level of the carina. Enteric tube passes below the diaphragm. Right-sided internal jugular central line is stable in position with tip in the expected region of the right brachiocephalic vein. There is improved aeration within the right lung. Left lung remains relatively clear, perhaps mild edema and/ or atelectasis at the left lung base. No pneumothorax. IMPRESSION: 1. Diffuse airspace opacities throughout the right lung, but with improved aeration compared to the previous chest x-ray of 11/08/2015 suggesting clearing pneumonia and/or edema. 2.  Mild edema and/or atelectasis at the left lung base. 3.  Tubes and  lines stable in position. Electronically Signed   By: Bary RichardStan  Maynard M.D.   On: 11/11/2015 12:13     ASSESSMENT / PLAN:  PULMONARY A: Acute Hypoxic Respiratory Failure - Secondary to Severe CAP Acute Hypercarbic Respiratory Failure  Severe CAP  P:   Full vent support VAP  Duoneb q6hr Starting Chest PT q4hr via bed Holding on diuresis given Acute Renal Failure  CARDIOVASCULAR A:  Septic Shock - Shock has resolved.  P:  TTE poor quality to evaluate wall motion Monitoring on telemetry  RENAL A:   Acute Renal Failure - Improving. Hypernatremia  P:   Trending UOP with foley catheter Trending renal function with daily BUN/Creatinine Avoiding nephrotoxic  agents Free [place] 200cc via tube q8hr  GASTROINTESTINAL A:   No acute issues   P:   Protonix via tube daily Tube feedings per dietary recommendations  HEMATOLOGIC A:   Leukocytosis - Secondary to Sepsis. Improving.  P:  Trending Leukocyte count daily with CBC SCDs Heparin [place] q8hr  INFECTIOUS A:   Septic Shock - Secondary to Severe CAP Severe CAP - Strep Pneumo Ag positive. Treated as outpt with Levaquin.  P:    Day #6 of Zosyn & Vancomycin Awaiting finalization of cultures  ENDOCRINE A:   Prediabetes in 2013  - BG stable on tube feedings. Hgb A1c 6.5 11/28  P:   Monitoring BG on daily BMP  NEUROLOGIC A:   Sedation on Ventilator Encephalopathy - secondary to sedation & toxic metabolic encephalopathy.  P:   RASS goal: -1 Versed IV prn Fentanyl IV prn Fentanyl gtt   FAMILY  - Updates: Wife updated at bedside by [facility] on 12/3.  - Inter-disciplinary family meet or Palliative Care meeting due by:  12/5   TODAY'S SUMMARY: 59 year old morbidly obese male admitted with severe community acquired pneumonia and acute hypoxic respiratory failure. Continuing to wean ventilator support. Continuing chest PT. Slow weaning of FiO2.  I have spent a total of 33 minutes of critical care time today  independently of the resident's time caring for the patient, updating the patient's wife at bedside, & reviewing the patient's electronic medical record.  Donna Christen Jamison Neighbor, M.D. [facility] Pulmonary & Critical Care Pager:  [phone] After 3pm or if no response, call [phone]  11/12/2015 10:41 AM

## 2015-11-13 LAB — CBC WITH DIFFERENTIAL/PLATELET
Basophils Absolute: 0 10*3/uL (ref 0.0–0.1)
Basophils Relative: 0 %
Eosinophils Absolute: 0.3 10*3/uL (ref 0.0–0.7)
Eosinophils Relative: 2 %
HEMATOCRIT: 36.4 % — AB (ref 39.0–52.0)
Hemoglobin: 10.8 g/dL — ABNORMAL LOW (ref 13.0–17.0)
LYMPHS ABS: 1.7 10*3/uL (ref 0.7–4.0)
LYMPHS PCT: 8 %
MCH: 27.7 pg (ref 26.0–34.0)
MCHC: 29.7 g/dL — AB (ref 30.0–36.0)
MCV: 93.3 fL (ref 78.0–100.0)
MONO ABS: 0.8 10*3/uL (ref 0.1–1.0)
MONOS PCT: 4 %
NEUTROS ABS: 17.7 10*3/uL — AB (ref 1.7–7.7)
Neutrophils Relative %: 86 %
Platelets: 387 10*3/uL (ref 150–400)
RBC: 3.9 MIL/uL — ABNORMAL LOW (ref 4.22–5.81)
RDW: 15.8 % — AB (ref 11.5–15.5)
WBC: 20.6 10*3/uL — ABNORMAL HIGH (ref 4.0–10.5)

## 2015-11-13 LAB — BASIC METABOLIC PANEL
ANION GAP: 5 (ref 5–15)
BUN: 46 mg/dL — ABNORMAL HIGH (ref 6–20)
CHLORIDE: 113 mmol/L — AB (ref 101–111)
CO2: 32 mmol/L (ref 22–32)
CREATININE: 1.22 mg/dL (ref 0.61–1.24)
Calcium: 8 mg/dL — ABNORMAL LOW (ref 8.9–10.3)
GFR calc non Af Amer: >60 mL/min (ref >60)
Glucose, Bld: 128 mg/dL — ABNORMAL HIGH (ref 65–99)
POTASSIUM: 4.2 mmol/L (ref 3.5–5.1)
SODIUM: 150 mmol/L — AB (ref 135–145)

## 2015-11-13 LAB — CULTURE, RESPIRATORY W GRAM STAIN

## 2015-11-13 LAB — RENAL FUNCTION PANEL
ANION GAP: 5 (ref 5–15)
Albumin: 1.5 g/dL — ABNORMAL LOW (ref 3.5–5.0)
BUN: 47 mg/dL — ABNORMAL HIGH (ref 6–20)
CHLORIDE: 112 mmol/L — AB (ref 101–111)
CO2: 31 mmol/L (ref 22–32)
CREATININE: 1.22 mg/dL (ref 0.61–1.24)
Calcium: 7.9 mg/dL — ABNORMAL LOW (ref 8.9–10.3)
Glucose, Bld: 123 mg/dL — ABNORMAL HIGH (ref 65–99)
POTASSIUM: 5.6 mmol/L — AB (ref 3.5–5.1)
Phosphorus: 3.6 mg/dL (ref 2.5–4.6)
Sodium: 148 mmol/L — ABNORMAL HIGH (ref 135–145)

## 2015-11-13 LAB — VANCOMYCIN, TROUGH: Vancomycin Tr: 12 ug/mL (ref 10.0–20.0)

## 2015-11-13 LAB — GLUCOSE, CAPILLARY: Glucose-Capillary: 124 mg/dL — ABNORMAL HIGH (ref 65–99)

## 2015-11-13 LAB — CULTURE, RESPIRATORY: SPECIAL REQUESTS: NORMAL

## 2015-11-13 LAB — MAGNESIUM: Magnesium: 2.7 mg/dL — ABNORMAL HIGH (ref 1.7–2.4)

## 2015-11-13 MED ORDER — FREE WATER
400.0000 mL | Freq: Four times a day (QID) | Status: DC
  Administered 2015-11-14 – 2015-11-15 (×5): 400 mL

## 2015-11-13 NOTE — Progress Notes (Signed)
ANTIBIOTIC CONSULT NOTE - FOLLOW UP  Pharmacy Consult for Vanc/Zosyn Indication: pneumonia  No Known Allergies  Patient Measurements: Height:  (177.8 cm) Weight: (!) 327 lb 9.7 oz (148.6 kg) IBW/kg (Calculated) : 73  Vital Signs: Temp: 100.1 F (37.8 C) (12/04 0759) Temp Source: Oral (12/04 0759) BP: 101/64 mmHg (12/04 1200) Pulse Rate: 84 (12/04 1200) Intake/Output from previous day: 12/03 0701 - 12/04 0700 In: 3320 [I.V.:715; NG/GT:2080; IV Piggyback:525] Out: 3170 [Urine:2670; Stool:500] Intake/Output from this shift: Total I/O In: 516.1 [I.V.:166.1; NG/GT:350] Out: 950 [Urine:950]  Labs:  Recent Labs  11/11/15 0348 11/11/15 0401 11/12/15 0425 11/13/15 0500 11/13/15 0645  WBC  --  17.5* 19.5* 20.6*  --   HGB  --  11.2* 11.1* 10.8*  --   PLT  --  274 333 387  --   CREATININE 1.40*  --  1.25*  --  1.22   Estimated Creatinine Clearance: 97.5 mL/min (by C-G formula based on Cr of 1.22).  Recent Labs  11/13/15 1055  VANCOTROUGH 12     Microbiology: Recent Results (from the past 720 hour(s))  MRSA PCR Screening     Status: Abnormal   Collection Time: 11/07/15  7:02 PM  Result Value Ref Range Status   MRSA by PCR POSITIVE (A) NEGATIVE Final    Comment:        The GeneXpert MRSA Assay (FDA approved for NASAL specimens only), is one component of a comprehensive MRSA colonization surveillance program. It is not intended to diagnose MRSA infection nor to guide or monitor treatment for MRSA infections. RESULT CALLED TO, READ BACK BY AND VERIFIED WITH: D HOVANDER RN 2036 11/07/15 A BROWNING   Culture, blood (routine x 2)     Status: None   Collection Time: 11/07/15  7:35 PM  Result Value Ref Range Status   Specimen Description BLOOD LEFT ANTECUBITAL  Final   Special Requests BOTTLES DRAWN AEROBIC AND ANAEROBIC 7CC  Final   Culture NO GROWTH 5 DAYS  Final   Report Status 11/12/2015 FINAL  Final  Culture, blood (routine x 2)     Status: None   Collection Time: 11/07/15  7:49 PM  Result Value Ref Range Status   Specimen Description BLOOD LEFT ARM  Final   Special Requests IN PEDIATRIC BOTTLE 2CC  Final   Culture NO GROWTH 5 DAYS  Final   Report Status 11/12/2015 FINAL  Final  Respiratory virus panel     Status: None   Collection Time: 11/07/15 11:01 PM  Result Value Ref Range Status   Source - RVPAN NASOPHARYNGEAL  Corrected   Respiratory Syncytial Virus A Negative Negative Final   Respiratory Syncytial Virus B Negative Negative Final   Influenza A Negative Negative Final   Influenza B Negative Negative Final   Parainfluenza 1 Negative Negative Final   Parainfluenza 2 Negative Negative Final   Parainfluenza 3 Negative Negative Final   Metapneumovirus Negative Negative Final   Rhinovirus Negative Negative Final   Adenovirus Negative Negative Final    Comment: (NOTE) Performed At: [facility] [address] [place], [place] [id number] Mila Homer MD WU:[id number]   Gram stain     Status: None   Collection Time: 11/08/15 12:19 AM  Result Value Ref Range Status   Specimen Description TRACHEAL ASPIRATE  Final   Special Requests Normal  Final   Gram Stain   Final    ABUNDANT WBC PRESENT,BOTH PMN AND MONONUCLEAR CORRECTED ON 11/29 AT 0936: PREVIOUSLY REPORTED AS MODERATE  WBC PRESENT, PREDOMINANTLY MONONUCLEAR NO ORGANISMS SEEN CORRECTED RESULTS CALLED TO: L ROBBINS,RN AT 0940 11/08/15 BY L BENFIELD    Report Status 11/08/2015 FINAL  Final  Culture, respiratory (NON-Expectorated)     Status: None   Collection Time: 11/09/15 10:32 AM  Result Value Ref Range Status   Specimen Description TRACHEAL ASPIRATE  Final   Special Requests Normal  Final   Gram Stain   Final    ABUNDANT WBC PRESENT,BOTH PMN AND MONONUCLEAR FEW SQUAMOUS EPITHELIAL CELLS PRESENT NO ORGANISMS SEEN Performed at [facility]    Culture   Final    RARE YEAST CONSISTENT WITH CANDIDA SPECIES Performed at [facility]     Report Status 11/12/2015 FINAL  Final  Culture, blood (routine x 2)     Status: None (Preliminary result)   Collection Time: 11/10/15 11:44 AM  Result Value Ref Range Status   Specimen Description BLOOD RIGHT HAND  Final   Special Requests BOTTLES DRAWN AEROBIC ONLY  5CC  Final   Culture NO GROWTH 2 DAYS  Final   Report Status PENDING  Incomplete  Culture, blood (routine x 2)     Status: None (Preliminary result)   Collection Time: 11/10/15 11:50 AM  Result Value Ref Range Status   Specimen Description BLOOD LEFT HAND  Final   Special Requests BOTTLES DRAWN AEROBIC ONLY 5CC  Final   Culture NO GROWTH 2 DAYS  Final   Report Status PENDING  Incomplete  Culture, respiratory (NON-Expectorated)     Status: None   Collection Time: 11/10/15 12:35 PM  Result Value Ref Range Status   Specimen Description TRACHEAL ASPIRATE  Final   Special Requests Normal  Final   Gram Stain   Final    ABUNDANT WBC PRESENT, PREDOMINANTLY PMN NO SQUAMOUS EPITHELIAL CELLS SEEN NO ORGANISMS SEEN Performed at [facility]    Culture   Final    FEW YEAST CONSISTENT WITH CANDIDA SPECIES Performed at [facility]    Report Status 11/13/2015 FINAL  Final  Culture, Urine     Status: None   Collection Time: 11/10/15  1:45 PM  Result Value Ref Range Status   Specimen Description URINE, CATHETERIZED  Final   Special Requests Normal  Final   Culture NO GROWTH 1 DAY  Final   Report Status 11/11/2015 FINAL  Final    Assessment: 59 y/o M presented to OSH w/ acute on chronic hypercapnia that required intubation [facility] Pt reported 1 week hx of cough, fever, and worsening SOB.   On Zosyn and Vanc. Cultures negs  Goal of Therapy:  Vancomycin trough level 15-20 mcg/ml  Plan:  D/C Vancomycin Zosyn 3.375g IV q8h Monitor renal function, cultures, LOT  Isaac BlissMichael Sheria Rosello, PharmD, BCPS, [place] Clinical Pharmacist Pager [phone] 11/13/2015 12:11 PM

## 2015-11-13 NOTE — Progress Notes (Signed)
PULMONARY / CRITICAL CARE MEDICINE   Name: Kenneth Casey MRN: [id number] DOB: 11-27-1956    ADMISSION DATE:  11/07/2015 CONSULTATION DATE:   11/07/15  REFERRING MD :  [facility]  CHIEF COMPLAINT:  Hypercarbic respiratory failure  INITIAL PRESENTATION: Kenneth Casey is a 59 y/o male with a h/o COPD who presented to [facility] with a 1 week history of cough, fevers, and worsening SOB found to be hypoxic to 86% on RA with an initial lactic acid of 3.6 and BNP of 703. He eventually required intubation for hypercarbic respiratory failure and was stated on Levophed for hypotension.    STUDIES:  CXR 11/28: Almost complete opacification of the R lung with air bronchograms.  CT Chest w/o 11/29:  Personally reviewed by me. Dense right lung opacity with air bronchograms consistent with consolidation. TTE 11/29:  Extremely limited. Mild LVH with dilation. EF 55-60%. Grade 1 diastolic dysfunction. Cannot r/o wall motion abnormality. RV poorly visualized. No aortic stenosis or regurg. No mitral stenosis or regurg. No effusion. Port CXR 12/2:  Improving right lung opacity. ETT in good position.   SIGNIFICANT EVENTS: 11/28 - Respiratory failure at [facility], intubated, started on Levo 11/28 - Transferred to [facility] 11/29 - Self extubated & reintubated by JN  MICROBIOLOGY  Blood Ctx 12/1>> Blood Ctx [place] 11/28:  Negative Trach Aspirate 12/1:  Oral Flora Trach Aspirate 11/30:  Oral Flora Urine Ctx 12/1:  Negative Sputum Grm Stain [place] 11/28:  Many WBC Urine Strep Ag 11/28:  Positive Urine Legionella Ag 11/28:  Negative Respiratory Viral Panel PCR 11/28:  Negative HIV 11/29:  Negative  ANTIBIOTICS Zosyn 11/28>>  Vancomycin 11/28 - 12/4 Azithromycin 11/28 - 12/1 Tamiflu 11/28 - 12/1 Levaquin 11/28 - 11/28 Ceftriaxone 11/28 - 11/28   LINES/TUBES: R IJ CVL 11/28>> Foley 11/28>> OETT 11/28 >>11/29 (self-extubation)>>11/29 (reintubation by JN) R Radial Art  Line 11/28 - 12/1  SUBJECTIVE: No events overnight. Patient watching TV this morning. Family at bedside. No distress. Patient does not yes to pain.  REVIEW OF SYSTEMS:  Unable to obtain due to intubation.  VITAL SIGNS: Temp:  [99 F (37.2 C)-100.3 F (37.9 C)] 99.3 F (37.4 C) (12/04 1959) Pulse Rate:  [74-100] 86 (12/04 2000) Resp:  [19-23] 22 (12/04 2000) BP: (101-151)/(61-77) 126/65 mmHg (12/04 2000) SpO2:  [91 %-98 %] 94 % (12/04 2000) FiO2 (%):  [40 %] 40 % (12/04 2000) Weight:  [327 lb 9.7 oz (148.6 kg)] 327 lb 9.7 oz (148.6 kg) (12/04 0500) HEMODYNAMICS:   VENTILATOR SETTINGS: Vent Mode:  [-] PRVC FiO2 (%):  [40 %] 40 % Set Rate:  [22 bmp] 22 bmp Vt Set:  [600 mL] 600 mL PEEP:  [10 cmH20-14 cmH20] 10 cmH20 Plateau Pressure:  [22 cmH20-29 cmH20] 23 cmH20 INTAKE / OUTPUT:  Intake/Output Summary (Last 24 hours) at 11/13/15 2051 Last data filed at 11/13/15 2008  Gross per 24 hour  Intake 3286.13 ml  Output   3520 ml  Net -233.87 ml    PHYSICAL EXAMINATION: General:  Awake. Eyes open. Wife at bedside. Alert. Integument:  Warm & dry. No rash on exposed skin.  HEENT:  No scleral icterus. Endotracheal tube in place. PERRL. Cardiovascular:  Regular rate. Unable to appreciate JVD given body habitus. No edema. Pulmonary:  Coarse breath sounds bilaterally but good aeration. Symmetric chest wall movement on ventilator. Abdomen: Soft. Normal bowel sounds. Protuberant. Neurological:  Following commands. Nods to questions. Moving all extremities equally.  LABS:  CBC  Recent Labs Lab  11/11/15 0401 11/12/15 0425 11/13/15 0500  WBC 17.5* 19.5* 20.6*  HGB 11.2* 11.1* 10.8*  HCT 35.7* 36.3* 36.4*  PLT 274 333 387   Coag's  Recent Labs Lab 11/07/15 2106 11/07/15 2311  APTT  --  34  INR 1.48  --    BMET  Recent Labs Lab 11/12/15 0425 11/13/15 0645 11/13/15 1155  NA 148* 148* 150*  K 4.6 5.6* 4.2  CL 109 112* 113*  CO2 32 31 32  BUN 46* 47* 46*   CREATININE 1.25* 1.22 1.22  GLUCOSE 115* 123* 128*   Electrolytes  Recent Labs Lab 11/11/15 0348 11/11/15 0401 11/12/15 0425 11/13/15 0645 11/13/15 1155  CALCIUM 7.8*  --  7.9* 7.9* 8.0*  MG  --  2.6* 2.5* 2.7*  --   PHOS 3.8  --  3.7 3.6  --    Sepsis Markers  Recent Labs Lab 11/07/15 1935 11/07/15 2311 11/08/15 0723 11/08/15 1310 11/09/15 0450  LATICACIDVEN 2.1*  --  1.8 1.6  --   PROCALCITON  --  17.42  --   --  10.74   ABG  Recent Labs Lab 11/07/15 2125 11/07/15 2250 11/08/15 0400  PHART 7.199* 7.260* 7.390  PCO2ART 74.2* 63.1* 51.2*  PO2ART 94.5 54.2* 93.6   Liver Enzymes  Recent Labs Lab 11/07/15 1935  11/11/15 0348 11/12/15 0425 11/13/15 0645  AST 69*  --   --   --   --   ALT 42  --   --   --   --   ALKPHOS 171*  --   --   --   --   BILITOT 1.7*  --   --   --   --   ALBUMIN 2.0*  < > 1.4* 1.5* 1.5*  < > = values in this interval not displayed. Cardiac Enzymes  Recent Labs Lab 11/07/15 2311 11/08/15 0550 11/08/15 1310  TROPONINI 0.27* 0.14* 0.06*   Glucose  Recent Labs Lab 11/10/15 2318 11/11/15 0723 11/11/15 1108 11/11/15 1527 11/11/15 1932 11/13/15 0041  GLUCAP 120* 112* 128* 132* 122* 124*    Imaging No results found.   ASSESSMENT / PLAN:  PULMONARY A: Acute Hypoxic Respiratory Failure - Secondary to Severe CAP. Improving. Acute Hypercarbic Respiratory Failure  Severe CAP - Improving.  P:   Full vent support VAP  Duoneb q6hr Chest PT q4hr via bed Holding on diuresis given Acute Renal Failure & Hypernatremia  CARDIOVASCULAR A:  Septic Shock - Shock has resolved.  P:  TTE poor quality to evaluate wall motion Monitoring on telemetry  RENAL A:   Acute Renal Failure - Resolved. Hypernatremia - Worsening.  P:   Trending UOP with foley catheter Trending renal function with daily BUN/Creatinine Avoiding nephrotoxic agents Free Water 400cc via tube q4hr  GASTROINTESTINAL A:   No acute issues   P:    Protonix via tube daily Tube feedings per dietary recommendations  HEMATOLOGIC A:   Leukocytosis - Secondary to Sepsis. Improving.  P:  Trending Leukocyte count daily with CBC SCDs Heparin Bier q8hr  INFECTIOUS A:   Septic Shock - Secondary to Severe CAP Severe CAP - Strep Pneumo Ag positive. Treated as outpt with Levaquin.  P:    Day #7 of Zosyn & Vancomycin D/C Vancomcyin Awaiting finalization of cultures  ENDOCRINE A:   Prediabetes in 2013  - BG stable on tube feedings. Hgb A1c 6.5 11/28  P:   Monitoring BG on daily BMP  NEUROLOGIC A:   Sedation on Ventilator Encephalopathy -  Resolved. Secondary to sedation & toxic metabolic encephalopathy.  P:   RASS goal: 0 Versed IV prn Fentanyl IV prn Fentanyl gtt   FAMILY  - Updates: Wife updated at bedside by [facility] on 12/4.  TODAY'S SUMMARY: 59 year old morbidly obese male admitted with severe community acquired pneumonia and acute hypoxic respiratory failure. Continuing to wean ventilator support. Continuing chest PT. Slow weaning of PEEP.  I have spent a total of 37 minutes of critical care time today caring for the patient, updating the patient's wife and daughter bedside, & reviewing the patient's electronic medical record.  Donna Christen Jamison Neighbor, M.D. [facility] Pulmonary & Critical Care Pager:  [phone] After 3pm or if no response, call [phone]  11/13/2015 8:51 PM

## 2015-11-14 DIAGNOSIS — E870 Hyperosmolality and hypernatremia: Secondary | ICD-10-CM

## 2015-11-14 LAB — BASIC METABOLIC PANEL
ANION GAP: 5 (ref 5–15)
BUN: 47 mg/dL — ABNORMAL HIGH (ref 6–20)
CALCIUM: 8.2 mg/dL — AB (ref 8.9–10.3)
CO2: 30 mmol/L (ref 22–32)
Chloride: 115 mmol/L — ABNORMAL HIGH (ref 101–111)
Creatinine, Ser: 1.13 mg/dL (ref 0.61–1.24)
GLUCOSE: 121 mg/dL — AB (ref 65–99)
Potassium: 4.2 mmol/L (ref 3.5–5.1)
SODIUM: 150 mmol/L — AB (ref 135–145)

## 2015-11-14 LAB — RENAL FUNCTION PANEL
ALBUMIN: 1.6 g/dL — AB (ref 3.5–5.0)
ANION GAP: 8 (ref 5–15)
BUN: 48 mg/dL — AB (ref 6–20)
CO2: 30 mmol/L (ref 22–32)
Calcium: 8.2 mg/dL — ABNORMAL LOW (ref 8.9–10.3)
Chloride: 112 mmol/L — ABNORMAL HIGH (ref 101–111)
Creatinine, Ser: 1.2 mg/dL (ref 0.61–1.24)
Glucose, Bld: 129 mg/dL — ABNORMAL HIGH (ref 65–99)
PHOSPHORUS: 3.8 mg/dL (ref 2.5–4.6)
POTASSIUM: 3.9 mmol/L (ref 3.5–5.1)
Sodium: 150 mmol/L — ABNORMAL HIGH (ref 135–145)

## 2015-11-14 LAB — CBC WITH DIFFERENTIAL/PLATELET
BASOS ABS: 0 10*3/uL (ref 0.0–0.1)
Basophils Relative: 0 %
Eosinophils Absolute: 0.5 10*3/uL (ref 0.0–0.7)
Eosinophils Relative: 2 %
HEMATOCRIT: 35.7 % — AB (ref 39.0–52.0)
HEMOGLOBIN: 10.7 g/dL — AB (ref 13.0–17.0)
LYMPHS ABS: 2.1 10*3/uL (ref 0.7–4.0)
LYMPHS PCT: 9 %
MCH: 28.2 pg (ref 26.0–34.0)
MCHC: 30 g/dL (ref 30.0–36.0)
MCV: 94.2 fL (ref 78.0–100.0)
Monocytes Absolute: 0.5 10*3/uL (ref 0.1–1.0)
Monocytes Relative: 2 %
NEUTROS ABS: 19.4 10*3/uL — AB (ref 1.7–7.7)
Neutrophils Relative %: 86 %
Platelets: 408 10*3/uL — ABNORMAL HIGH (ref 150–400)
RBC: 3.79 MIL/uL — AB (ref 4.22–5.81)
RDW: 15.9 % — ABNORMAL HIGH (ref 11.5–15.5)
WBC: 22.5 10*3/uL — AB (ref 4.0–10.5)

## 2015-11-14 LAB — MAGNESIUM: Magnesium: 2.5 mg/dL — ABNORMAL HIGH (ref 1.7–2.4)

## 2015-11-14 LAB — GLUCOSE, CAPILLARY: GLUCOSE-CAPILLARY: 120 mg/dL — AB (ref 65–99)

## 2015-11-14 MED ORDER — FUROSEMIDE 10 MG/ML IJ SOLN: 10.0000 mg | Freq: Once | INTRAMUSCULAR | Status: AC

## 2015-11-14 MED ORDER — POTASSIUM CHLORIDE 20 MEQ/15ML (10%) PO SOLN
40.0000 meq | Freq: Once | ORAL | Status: AC
  Administered 2015-11-14: 40 meq
  Filled 2015-11-14: qty 30

## 2015-11-14 MED ORDER — FUROSEMIDE 10 MG/ML IJ SOLN
INTRAMUSCULAR | Status: AC
  Administered 2015-11-14: 10 mg
  Filled 2015-11-14: qty 2

## 2015-11-14 MED ORDER — CHLORHEXIDINE GLUCONATE 0.12% ORAL RINSE (MEDLINE KIT)
15.0000 mL | Freq: Two times a day (BID) | OROMUCOSAL | Status: DC
  Administered 2015-11-14 – 2015-11-15 (×3): 15 mL via OROMUCOSAL

## 2015-11-14 MED ORDER — ANTISEPTIC ORAL RINSE SOLUTION (CORINZ)
7.0000 mL | Freq: Four times a day (QID) | OROMUCOSAL | Status: DC
  Administered 2015-11-14 – 2015-11-15 (×4): 7 mL via OROMUCOSAL

## 2015-11-14 NOTE — Progress Notes (Signed)
Nutrition Follow-up  DOCUMENTATION CODES:   Morbid obesity  INTERVENTION:    Continue Vital High Protein formula at goal rate of 70 ml/hr with Prostat 30 ml TID to provide 1980 kcals, 192 gm protein, 1404 ml of free water daily  NUTRITION DIAGNOSIS:   Inadequate oral intake related to inability to eat as evidenced by NPO status, ongoing  GOAL:   Provide needs based on ASPEN/SCCM guidelines, met  MONITOR:   Vent status, TF tolerance, Labs, Weight trends, Skin, I & O's  ASSESSMENT:   59 y/o Male with a h/o COPD who presented to [facility] with a 1 week history of cough, fevers, and worsening SOB found to be hypoxic to 86% on RA with an initial lactic acid of 3.6 and BNP of 703. He eventually required intubation for hypercarbic respiratory failure and was started on Levophed for hypotension.CXR on 11/28 showed almost complete opacification of the R lung with air bronchograms.  Patient is currently intubated on ventilator support Temp (24hrs), Avg:98.5 F (36.9 C), Min:97.9 F (36.6 C), Max:99.3 F (37.4 C)   Vital High Protein formula currently infusing at goal rate of 70 ml/hr via OGT with Prostat liquid protein 30 ml TID providing 1980 kcals, 192 gm protein, 1404 ml free water daily.  Tolerating well.  Free water flushes at 400 ml every 6 hours.  CCM note reviewed.  Shock resolved.  Severe CAP improving.  Diet Order:  Diet NPO time specified  Skin:  Wound (see comment) (Stage II to sacrum)  Last BM:  12/5  Height:   Ht Readings from Last 1 Encounters:  11/07/15 '5\' 10"'  (1.778 m)    Weight:   Wt Readings from Last 1 Encounters:  11/14/15 318 lb 12.6 oz (144.6 kg)    Ideal Body Weight:  75.5 kg  BMI:  Body mass index is 45.74 kg/(m^2).  Estimated Nutritional Needs:   Kcal:  9937-1696  Protein:  188 gm  Fluid:  >/= 2 L  EDUCATION NEEDS:   No education needs identified at this time  Arthur Holms, RD, LDN Pager #:  [phone] After-Hours Pager #: [phone]

## 2015-11-14 NOTE — Progress Notes (Signed)
eLink Physician-Brief Progress Note Patient Name: Kenneth Casey DOB: 02/03/1956 MRN: [id number]   Date of Service  11/14/2015  HPI/Events of Note    eICU Interventions  Hypokalemia, repleted      Intervention Category Minor Interventions: Electrolytes abnormality - evaluation and management  Max FickleDouglas Cristal Howatt 11/14/2015, 5:48 AM

## 2015-11-14 NOTE — Progress Notes (Signed)
PULMONARY / CRITICAL CARE MEDICINE   Name: Kenneth Casey MRN: [id number] DOB: 07-01-1956    ADMISSION DATE:  11/07/2015 CONSULTATION DATE:   11/07/15  REFERRING MD :  [facility]  CHIEF COMPLAINT:  Hypercarbic respiratory failure  INITIAL PRESENTATION: Kenneth Casey is a 59 y/o male with a h/o COPD who presented to [facility] with a 1 week history of cough, fevers, and worsening SOB found to be hypoxic to 86% on RA with an initial lactic acid of 3.6 and BNP of 703. He eventually required intubation for hypercarbic respiratory failure and was stated on Levophed for hypotension.    STUDIES:  CXR 11/28: Almost complete opacification of the R lung with air bronchograms.  CT Chest w/o 11/29:  Personally reviewed by me. Dense right lung opacity with air bronchograms consistent with consolidation. TTE 11/29:  Extremely limited. Mild LVH with dilation. EF 55-60%. Grade 1 diastolic dysfunction. Cannot r/o wall motion abnormality. RV poorly visualized. No aortic stenosis or regurg. No mitral stenosis or regurg. No effusion. Port CXR 12/2:  Improving right lung opacity. ETT in good position.   SIGNIFICANT EVENTS: 11/28 - Respiratory failure at [facility], intubated, started on Levo 11/28 - Transferred to [facility] 11/29 - Self extubated & reintubated by JN  MICROBIOLOGY  Blood Ctx 12/1>> Blood Ctx [place] 11/28:  Negative Trach Aspirate 12/1:  Oral Flora Trach Aspirate 11/30:  Oral Flora Urine Ctx 12/1:  Negative Sputum Grm Stain [place] 11/28:  Many WBC Urine Strep Ag 11/28:  Positive Urine Legionella Ag 11/28:  Negative Respiratory Viral Panel PCR 11/28:  Negative HIV 11/29:  Negative  ANTIBIOTICS Zosyn 11/28>>  Vancomycin 11/28 - 12/4 Azithromycin 11/28 - 12/1 Tamiflu 11/28 - 12/1 Levaquin 11/28 - 11/28 Ceftriaxone 11/28 - 11/28   LINES/TUBES: R IJ CVL 11/28>> Foley 11/28>> OETT 11/28 >>11/29 (self-extubation)>>11/29 (reintubation by JN) R Radial Art  Line 11/28 - 12/1  SUBJECTIVE: No events overnight. Patient still watching TV. Reporting some back pain today.Marland Kitchen  REVIEW OF SYSTEMS:  Unable to obtain due to intubation.  VITAL SIGNS: Temp:  [97.9 F (36.6 C)-99.3 F (37.4 C)] 98.1 F (36.7 C) (12/05 1119) Pulse Rate:  [67-88] 67 (12/05 1212) Resp:  [19-22] 19 (12/05 1100) BP: (109-130)/(58-76) 109/58 mmHg (12/05 1212) SpO2:  [91 %-97 %] 94 % (12/05 1428) FiO2 (%):  [40 %] 40 % (12/05 1428) Weight:  [318 lb 12.6 oz (144.6 kg)] 318 lb 12.6 oz (144.6 kg) (12/05 0500) HEMODYNAMICS: CVP:  [9 mmHg-70 mmHg] 59 mmHg VENTILATOR SETTINGS: Vent Mode:  [-] PRVC FiO2 (%):  [40 %] 40 % Set Rate:  [22 bmp] 22 bmp Vt Set:  [600 mL] 600 mL PEEP:  [8 cmH20-10 cmH20] 8 cmH20 Plateau Pressure:  [23 cmH20-25 cmH20] 25 cmH20 INTAKE / OUTPUT:  Intake/Output Summary (Last 24 hours) at 11/14/15 1447 Last data filed at 11/14/15 1136  Gross per 24 hour  Intake 3113.75 ml  Output   3000 ml  Net 113.75 ml    PHYSICAL EXAMINATION: General:  Awake. Eyes open. Family at bedside. Integument:  Warm & dry. No rash on exposed skin.  HEENT:  No scleral injection. Endotracheal tube in place. PERRL. Cardiovascular:  Regular rate. Unable to appreciate JVD given body habitus. No edema. Pulmonary:  Good aeration bilaterally with minimally coarse breath sounds. Symmetric chest wall movement on ventilator. Abdomen: Soft. Normal bowel sounds. Protuberant. Neurological:  Following commands. Nods to questions. Moving all extremities equally.  LABS:  CBC  Recent Labs Lab 11/12/15 0425  11/13/15 0500 11/14/15 0545  WBC 19.5* 20.6* 22.5*  HGB 11.1* 10.8* 10.7*  HCT 36.3* 36.4* 35.7*  PLT 333 387 408*   Coag's  Recent Labs Lab 11/07/15 2106 11/07/15 2311  APTT  --  34  INR 1.48  --    BMET  Recent Labs Lab 11/13/15 0645 11/13/15 1155 11/14/15 0545  NA 148* 150* 150*  K 5.6* 4.2 3.9  CL 112* 113* 112*  CO2 31 32 30  BUN 47* 46* 48*   CREATININE 1.22 1.22 1.20  GLUCOSE 123* 128* 129*   Electrolytes  Recent Labs Lab 11/12/15 0425 11/13/15 0645 11/13/15 1155 11/14/15 0545  CALCIUM 7.9* 7.9* 8.0* 8.2*  MG 2.5* 2.7*  --  2.5*  PHOS 3.7 3.6  --  3.8   Sepsis Markers  Recent Labs Lab 11/07/15 1935 11/07/15 2311 11/08/15 0723 11/08/15 1310 11/09/15 0450  LATICACIDVEN 2.1*  --  1.8 1.6  --   PROCALCITON  --  17.42  --   --  10.74   ABG  Recent Labs Lab 11/07/15 2125 11/07/15 2250 11/08/15 0400  PHART 7.199* 7.260* 7.390  PCO2ART 74.2* 63.1* 51.2*  PO2ART 94.5 54.2* 93.6   Liver Enzymes  Recent Labs Lab 11/07/15 1935  11/12/15 0425 11/13/15 0645 11/14/15 0545  AST 69*  --   --   --   --   ALT 42  --   --   --   --   ALKPHOS 171*  --   --   --   --   BILITOT 1.7*  --   --   --   --   ALBUMIN 2.0*  < > 1.5* 1.5* 1.6*  < > = values in this interval not displayed. Cardiac Enzymes  Recent Labs Lab 11/07/15 2311 11/08/15 0550 11/08/15 1310  TROPONINI 0.27* 0.14* 0.06*   Glucose  Recent Labs Lab 11/11/15 0723 11/11/15 1108 11/11/15 1527 11/11/15 1932 11/13/15 0041 11/14/15 0730  GLUCAP 112* 128* 132* 122* 124* 120*    Imaging No results found.   ASSESSMENT / PLAN:  PULMONARY A: Acute Hypoxic Respiratory Failure - Secondary to Severe CAP. Improving. Acute Hypercarbic Respiratory Failure  Severe CAP - Improving.  P:   Full vent support VAP  Duoneb q6hr Chest PT q4hr via bed Gentle diuresis with Lasix today  CARDIOVASCULAR A:  Septic Shock - Shock has resolved.  P:  TTE poor quality to evaluate wall motion Monitoring on telemetry  RENAL A:   Acute Renal Failure - Resolved. Hypernatremia - Stable.  P:   Trending UOP with foley catheter Trending renal function with daily BUN/Creatinine Avoiding nephrotoxic agents Free Water 400cc via tube q4hr Lasix 10mg  IVx1 Repeat BMP at 1500 today to monitor Na  GASTROINTESTINAL A:   No acute issues   P:    Protonix via tube daily Tube feedings per dietary recommendations  HEMATOLOGIC A:   Leukocytosis - Secondary to Sepsis. Worsening.  P:  Trending Leukocyte count daily with CBC SCDs Heparin [place] q8hr  INFECTIOUS A:   Septic Shock - Secondary to Severe CAP Severe CAP - Strep Pneumo Ag positive. Treated as outpt with Levaquin.  P:    Day #8 of Zosyn  Awaiting finalization of cultures Plan to re-culture for any fever  ENDOCRINE A:   Prediabetes in 2013  - BG stable on tube feedings. Hgb A1c 6.5 11/28  P:   Monitoring BG on daily BMP  NEUROLOGIC A:   Sedation on Ventilator Encephalopathy - Resolved. Secondary  to sedation & toxic metabolic encephalopathy.  P:   RASS goal: 0 Versed IV prn Fentanyl IV prn Fentanyl gtt   FAMILY  - Updates: Wife & daughter updated at bedside by JN on 12/5.  TODAY'S SUMMARY: 58 year old morbidly obese male admitted with severe community acquired pneumonia and acute hypoxic respiratory failure. PEEP weaned to 8. Plan to attempt gentle diuresis today & SBT in the morning. May required PEEP of 8 for transpulmonary pressure gradient. WBC rising but no fever yet. Expressed my cautious optimism to family today.  I have spent a total of 44 minutes of critical care time today caring for the patient, updating the patient's wife and daughter bedside, & reviewing the patient's electronic medical record.  Donna Christen Jamison Neighbor, M.D. [facility] Pulmonary & Critical Care Pager:  [phone] After 3pm or if no response, call [phone]  11/14/2015 2:47 PM

## 2015-11-15 ENCOUNTER — Encounter (HOSPITAL_COMMUNITY): Payer: Self-pay | Admitting: *Deleted

## 2015-11-15 LAB — CULTURE, BLOOD (ROUTINE X 2)
CULTURE: NO GROWTH
Culture: NO GROWTH

## 2015-11-15 LAB — GLUCOSE, CAPILLARY: Glucose-Capillary: 119 mg/dL — ABNORMAL HIGH (ref 65–99)

## 2015-11-15 LAB — CBC WITH DIFFERENTIAL/PLATELET
BASOS PCT: 0 %
Basophils Absolute: 0 10*3/uL (ref 0.0–0.1)
EOS ABS: 0.6 10*3/uL (ref 0.0–0.7)
Eosinophils Relative: 3 %
HEMATOCRIT: 36.6 % — AB (ref 39.0–52.0)
Hemoglobin: 10.7 g/dL — ABNORMAL LOW (ref 13.0–17.0)
LYMPHS ABS: 1.8 10*3/uL (ref 0.7–4.0)
Lymphocytes Relative: 9 %
MCH: 27.7 pg (ref 26.0–34.0)
MCHC: 29.2 g/dL — AB (ref 30.0–36.0)
MCV: 94.8 fL (ref 78.0–100.0)
MONO ABS: 0.5 10*3/uL (ref 0.1–1.0)
MONOS PCT: 2 %
Neutro Abs: 17.7 10*3/uL — ABNORMAL HIGH (ref 1.7–7.7)
Neutrophils Relative %: 86 %
Platelets: 473 10*3/uL — ABNORMAL HIGH (ref 150–400)
RBC: 3.86 MIL/uL — ABNORMAL LOW (ref 4.22–5.81)
RDW: 16.2 % — AB (ref 11.5–15.5)
WBC: 20.6 10*3/uL — ABNORMAL HIGH (ref 4.0–10.5)

## 2015-11-15 LAB — RENAL FUNCTION PANEL
ANION GAP: 7 (ref 5–15)
Albumin: 1.7 g/dL — ABNORMAL LOW (ref 3.5–5.0)
BUN: 47 mg/dL — ABNORMAL HIGH (ref 6–20)
CALCIUM: 7.9 mg/dL — AB (ref 8.9–10.3)
CHLORIDE: 114 mmol/L — AB (ref 101–111)
CO2: 29 mmol/L (ref 22–32)
Creatinine, Ser: 1.07 mg/dL (ref 0.61–1.24)
GFR calc non Af Amer: >60 mL/min (ref >60)
GLUCOSE: 119 mg/dL — AB (ref 65–99)
Phosphorus: 4.3 mg/dL (ref 2.5–4.6)
Potassium: 4.1 mmol/L (ref 3.5–5.1)
SODIUM: 150 mmol/L — AB (ref 135–145)

## 2015-11-15 LAB — BASIC METABOLIC PANEL
ANION GAP: 10 (ref 5–15)
BUN: 38 mg/dL — ABNORMAL HIGH (ref 6–20)
CHLORIDE: 112 mmol/L — AB (ref 101–111)
CO2: 27 mmol/L (ref 22–32)
Calcium: 8.3 mg/dL — ABNORMAL LOW (ref 8.9–10.3)
Creatinine, Ser: 1.25 mg/dL — ABNORMAL HIGH (ref 0.61–1.24)
GFR calc non Af Amer: >60 mL/min (ref >60)
GLUCOSE: 108 mg/dL — AB (ref 65–99)
POTASSIUM: 3.8 mmol/L (ref 3.5–5.1)
Sodium: 149 mmol/L — ABNORMAL HIGH (ref 135–145)

## 2015-11-15 LAB — MAGNESIUM: MAGNESIUM: 2.4 mg/dL (ref 1.7–2.4)

## 2015-11-15 MED ORDER — SODIUM CHLORIDE 0.45 % IV SOLN
INTRAVENOUS | Status: DC
  Administered 2015-11-15 – 2015-11-16 (×2): via INTRAVENOUS

## 2015-11-15 MED ORDER — PANTOPRAZOLE SODIUM 40 MG PO TBEC: 40.0000 mg | DELAYED_RELEASE_TABLET | Freq: Every day | ORAL | Status: DC

## 2015-11-15 MED ORDER — WHITE PETROLATUM GEL
Status: AC
  Administered 2015-11-15: 0.2
  Filled 2015-11-15: qty 1

## 2015-11-15 MED ORDER — FUROSEMIDE 10 MG/ML IJ SOLN
20.0000 mg | Freq: Four times a day (QID) | INTRAMUSCULAR | Status: AC
  Administered 2015-11-15 (×2): 20 mg via INTRAVENOUS
  Filled 2015-11-15 (×2): qty 2

## 2015-11-15 MED ORDER — ANTISEPTIC ORAL RINSE SOLUTION (CORINZ): 7.0000 mL | OROMUCOSAL | Status: DC | PRN

## 2015-11-15 NOTE — Progress Notes (Signed)
Patient refuses CPT at this time. York SpanielSaid it makes him feel sick to his stomach.

## 2015-11-15 NOTE — Progress Notes (Signed)
Fentanly 225ml wasted in sink with ChiropractorCarla RN Ambulance person(assistant director)

## 2015-11-15 NOTE — Progress Notes (Signed)
Patient would like to hold off on performing CPT at this time.  Stated that he did not get much out previous time.  Will continue to monitor.

## 2015-11-15 NOTE — Procedures (Signed)
Extubation Procedure Note  Patient Details:   Name: Dorena DewHerman Elgersma DOB: 05/18/1956 MRN: [id number]   Airway Documentation:     Evaluation  O2 sats: stable throughout Complications: No apparent complications Patient did tolerate procedure well. Bilateral Breath Sounds: Clear, Diminished Suctioning: Airway Yes   Patient extubated to 4L nasal cannula per MD order.  Positive cuff leak noted.  No evidence of stridor.  Patient able to speak post extubation.  Sats currently 95%.  Vitals are stable.  No apparent complications.    Durwin GlazeBrown, Kaislyn Gulas N 11/15/2015, 9:36 AM

## 2015-11-15 NOTE — Progress Notes (Signed)
Pt. Did not want CPT. Pt. States CPT has made his stomach feel uneasy.

## 2015-11-15 NOTE — Progress Notes (Signed)
Brief nursing progress note:   Patient very stable post extubation, no complaints of pain. Family visiting at bedside, patient joking and laughing with family very pleasantly.  Will continue to monitor very closely.

## 2015-11-15 NOTE — Progress Notes (Signed)
PULMONARY / CRITICAL CARE MEDICINE   Name: Kenneth Casey MRN: [id number] DOB: 04/26/1956    ADMISSION DATE:  11/07/2015 CONSULTATION DATE:   11/07/15  REFERRING MD :  [facility]  CHIEF COMPLAINT:  Hypercarbic respiratory failure  INITIAL PRESENTATION: Kenneth Casey is a 59 y/o male with a h/o COPD who [facility] with a 1 week history of cough, fevers, and worsening SOB found to be hypoxic to 86% on RA with an initial lactic acid of 3.6 and BNP of 703. He eventually required intubation for hypercarbic respiratory failure and was stated on Levophed for hypotension.    STUDIES:  CXR 11/28: Almost complete opacification of the R lung with air bronchograms.  CT Chest w/o 11/29:  Personally reviewed by me. Dense right lung opacity with air bronchograms consistent with consolidation. TTE 11/29:  Extremely limited. Mild LVH with dilation. EF 55-60%. Grade 1 diastolic dysfunction. Cannot r/o wall motion abnormality. RV poorly visualized. No aortic stenosis or regurg. No mitral stenosis or regurg. No effusion. Port CXR 12/2:  Improving right lung opacity. ETT in good position.   SIGNIFICANT EVENTS: 11/28 - [facility], [facility]ated, started on Levo 11/28 - [facility] 11/[facility] - Self extubated & reintubated by JN 12/06 - Extubated  MICROBIOLOGY  Blood Ctx 12/1:  Negative  Blood Ctx [place] 11/28:  Negative Trach Aspirate 12/1:  Oral [place]ora Trach Aspirate 11/30:  Oral [place] Urine Ctx 12/1:  Negative Sputum Grm Stain [place] 11/28:  Many WBC Urine Strep Ag 11/28:  Positive Urine Legionella Ag 11/28:  Negative Respiratory Viral Panel PCR 11/28:  Negative HIV 11/29:  Negative  ANTIBIOTICS Zosyn 11/28>>  Vancomycin 11/28 - 12/4 Azithromycin 11/28 - 12/1 Tamiflu 11/28 - 12/1 Levaquin 11/28 - 11/28 Ceftriaxone 11/28 - 11/28   LINES/TUBES: R IJ CVL 11/28>> Foley 11/28>> OETT 11/28 >>11/29 (self-extubation)>>11/29  (reintubation by JN) R Radial Art Line 11/28 - 12/1  SUBJECTIVE: No events overnight. Comfortable on SBT. Denies any pain with a nod on SBT this morning.  REVIEW OF SYSTEMS:  Unable to obtain due to intubation.  VITAL SIGNS: Temp:  [97.5 F (36.4 C)-98.7 F (37.1 C)] 97.5 F (36.4 C) (12/06 0737) Pulse Rate:  [64-93] 88 (12/06 1200) Resp:  [20-35] 33 (12/06 1200) BP: (101-137)/(56-71) 126/63 mmHg (12/06 1200) SpO2:  [90 %-97 %] 96 % (12/06 1200) FiO2 (%):  [40 %] 40 % (12/06 0712) Weight:  [324 lb 4.8 oz (147.1 kg)] 324 lb 4.8 oz (147.1 kg) (12/06 0446) HEMODYNAMICS: CVP:  [62 mmHg-74 mmHg] 74 mmHg VENTILATOR SETTINGS: Vent Mode:  [-] PSV;CPAP FiO2 (%):  [40 %] 40 % Set Rate:  [22 bmp] 22 bmp Vt Set:  [600 mL] 600 mL PEEP:  [5 cmH20-8 cmH20] 5 cmH20 Pressure Support:  [5 cmH20] 5 cmH20 Plateau Pressure:  [23 cmH20-26 cmH20] 26 cmH20 INTAKE / OUTPUT:  Intake/Output Summary (Last 24 hours) at 11/15/15 1306 Last data filed at 11/15/15 1200  Gross per 24 hour  Intake 2793.5 ml  Output   2600 ml  Net  193.5 ml    PHYSICAL EXAMINATION: General:  Awake. Eyes open. Morbidly obese.  Integument:  Warm & dry. No rash on exposed skin.  HEENT:  No scleral injection. Endotracheal tube in place. PERRL. Cardiovascular:  Regular rate. Unable to appreciate JVD given body habitus. No edema. Pulmonary:  Clear bilaterally to auscultation. Symmetric chest wall movement on ventilator. Normal WOB on PS 0/5. Abdomen: Soft. Normal bowel sounds. Protuberant. Neurological:  Following commands. Nods to questions. Moving all extremities equally.  LABS:  CBC  Recent Labs Lab 11/13/15 0500 11/14/15 0545 11/15/15 0440  WBC 20.6* 22.5* 20.6*  HGB 10.8* 10.7* 10.7*  HCT 36.4* 35.7* 36.6*  PLT 387 408* 473*   Coag's No results for input(s): APTT, INR in the last 168 hours. BMET  Recent Labs Lab 11/14/15 0545 11/14/15 1700 11/15/15 0440  NA 150* 150* 150*  K 3.9 4.2 4.1  CL 112*  115* 114*  CO2 BUN 48* 47* 47*  CREATININE 1.20 1.13 1.07  GLUCOSE 129* 121* 119*   Electrolytes  Recent Labs Lab 11/13/15 0645  11/14/15 0545 11/14/15 1700 11/15/15 0440  CALCIUM 7.9*  < > 8.2* 8.2* 7.9*  MG 2.7*  --  2.5*  --  2.4  PHOS 3.6  --  3.8  --  4.3  < > = values in this interval not displayed. Sepsis Markers  Recent Labs Lab 11/08/15 1310 11/09/15 0450  LATICACIDVEN 1.6  --   PROCALCITON  --  10.74   ABG No results for input(s): PHART, PCO2ART, PO2ART in the last 168 hours. Liver Enzymes  Recent Labs Lab 11/13/15 0645 11/14/15 0545 11/15/15 0440  ALBUMIN 1.5* 1.6* 1.7*   Cardiac Enzymes  Recent Labs Lab 11/08/15 1310  TROPONINI 0.06*   Glucose  Recent Labs Lab 11/11/15 1108 11/11/15 1527 11/11/15 1932 11/13/15 0041 11/14/15 0730 11/15/15 0735  GLUCAP 128* 132* 122* 124* 120* 119*    Imaging No results found.   ASSESSMENT / PLAN:  PULMONARY A: Acute Hypoxic Respiratory Failure - Secondary to Severe CAP. Improving. Acute Hypercarbic Respiratory Failure  Severe CAP - Improving.  P:   Extubate today Duoneb q6hr Chest PT q4hr via bed Lasix IV q6hr x2 doses today  CARDIOVASCULAR A:  Septic Shock - Shock has resolved.  P:  TTE poor quality to evaluate wall motion Monitoring on telemetry  RENAL A:   Acute Renal Failure - Resolved. Hypernatremia - Stable.  P:   Trending UOP with foley catheter Trending renal function with daily BUN/Creatinine Avoiding nephrotoxic agents Lasix   q6hr x2 Repeat BMP at 1500 today to monitor Na  GASTROINTESTINAL A:   No acute issues   P:   D/C Protonix RN bedside swallow Advance diet as tolerated to heart healthy  HEMATOLOGIC A:   Leukocytosis - Secondary to Sepsis. Improving.  P:  Trending Leukocyte count daily with CBC SCDs Heparin [place] q8hr  INFECTIOUS A:   Septic Shock - Secondary to Severe CAP Severe CAP - Strep Pneumo Ag positive. Treated as outpt  with Levaquin.  P:    Day #9/14 of Zosyn  Plan to re-culture for any fever  ENDOCRINE A:   Prediabetes in 2013  - BG stable on tube feedings. Hgb A1c 6.5 11/28  P:   Monitoring BG on daily BMP  NEUROLOGIC A:   Encephalopathy - Resolved. Secondary to sedation & toxic metabolic encephalopathy.  P:   D/C Fentanyl & Versed   FAMILY  - Updates: Wife & daughter updated at bedside by JN on 12/6.  TODAY'S SUMMARY: 59 year old morbidly obese male admitted with severe community acquired pneumonia and acute hypoxic respiratory failure. Successfully extubated today. Diuresing with Lasix IV x2. Plan for PT/OT consult to assess tomorrow.  I have spent a total of 41 minutes of critical care time today caring for the patient, updating the patient's wife and daughter bedside, planning his extubation with RT, & reviewing the patient's electronic  medical record.  Donna Christen Jamison Neighbor, M.D. [facility] Pulmonary & Critical Care Pager:  [phone] After 3pm or if no response, call [phone]  11/15/2015 1:06 PM

## 2015-11-15 NOTE — Progress Notes (Signed)
Holding CPT at this time due to patient weaning on SBT and do not want to agitate and cause increased work of breathing.

## 2015-11-16 LAB — CBC WITH DIFFERENTIAL/PLATELET
Basophils Absolute: 0 10*3/uL (ref 0.0–0.1)
Basophils Relative: 0 %
EOS ABS: 0.5 10*3/uL (ref 0.0–0.7)
EOS PCT: 2 %
HCT: 38.1 % — ABNORMAL LOW (ref 39.0–52.0)
Hemoglobin: 11.5 g/dL — ABNORMAL LOW (ref 13.0–17.0)
LYMPHS ABS: 2.2 10*3/uL (ref 0.7–4.0)
Lymphocytes Relative: 10 %
MCH: 28.5 pg (ref 26.0–34.0)
MCHC: 30.2 g/dL (ref 30.0–36.0)
MCV: 94.3 fL (ref 78.0–100.0)
MONOS PCT: 2 %
Monocytes Absolute: 0.5 10*3/uL (ref 0.1–1.0)
Neutro Abs: 18.8 10*3/uL — ABNORMAL HIGH (ref 1.7–7.7)
Neutrophils Relative %: 86 %
PLATELETS: 591 10*3/uL — AB (ref 150–400)
RBC: 4.04 MIL/uL — ABNORMAL LOW (ref 4.22–5.81)
RDW: 16.2 % — ABNORMAL HIGH (ref 11.5–15.5)
WBC: 22 10*3/uL — AB (ref 4.0–10.5)

## 2015-11-16 LAB — MAGNESIUM: MAGNESIUM: 2.5 mg/dL — AB (ref 1.7–2.4)

## 2015-11-16 LAB — RENAL FUNCTION PANEL
Albumin: 1.8 g/dL — ABNORMAL LOW (ref 3.5–5.0)
Anion gap: 7 (ref 5–15)
BUN: 33 mg/dL — AB (ref 6–20)
CHLORIDE: 113 mmol/L — AB (ref 101–111)
CO2: 31 mmol/L (ref 22–32)
CREATININE: 1.29 mg/dL — AB (ref 0.61–1.24)
Calcium: 8.4 mg/dL — ABNORMAL LOW (ref 8.9–10.3)
GFR calc Af Amer: >60 mL/min (ref >60)
GFR calc non Af Amer: 60 mL/min — ABNORMAL LOW (ref >60)
GLUCOSE: 100 mg/dL — AB (ref 65–99)
Phosphorus: 4.2 mg/dL (ref 2.5–4.6)
Potassium: 3.6 mmol/L (ref 3.5–5.1)
Sodium: 151 mmol/L — ABNORMAL HIGH (ref 135–145)

## 2015-11-16 MED ORDER — POTASSIUM CHLORIDE CRYS ER 20 MEQ PO TBCR
30.0000 meq | EXTENDED_RELEASE_TABLET | ORAL | Status: AC
  Administered 2015-11-16 (×2): 30 meq via ORAL
  Filled 2015-11-16 (×2): qty 1

## 2015-11-16 MED ORDER — DIPHENHYDRAMINE HCL 25 MG PO CAPS
25.0000 mg | ORAL_CAPSULE | Freq: Three times a day (TID) | ORAL | Status: DC | PRN
Start: 2015-11-16 — End: 2015-11-18
  Administered 2015-11-16: 25 mg via ORAL
  Filled 2015-11-16: qty 1

## 2015-11-16 MED ORDER — FUROSEMIDE 10 MG/ML IJ SOLN
20.0000 mg | Freq: Four times a day (QID) | INTRAMUSCULAR | Status: AC
  Administered 2015-11-16 (×2): 20 mg via INTRAVENOUS
  Filled 2015-11-16 (×2): qty 2

## 2015-11-16 MED ORDER — DIPHENHYDRAMINE HCL 50 MG/ML IJ SOLN
25.0000 mg | Freq: Three times a day (TID) | INTRAMUSCULAR | Status: DC | PRN
  Administered 2015-11-16: 25 mg via INTRAVENOUS
  Filled 2015-11-16: qty 1

## 2015-11-16 NOTE — Progress Notes (Signed)
[place] ICU Electrolyte Replacement Protocol  Patient Name: Kenneth Casey DOB: 07-08-1956 MRN: [id number]  Date of Service  11/16/2015   HPI/Events of Note    Recent Labs Lab 11/12/15 0425 11/13/15 0645  11/14/15 0545 11/14/15 1700 11/15/15 0440 11/15/15 1729 11/16/15 0530  NA 148* 148*  < > 150* 150* 150* 149* 151*  K 4.6 5.6*  < > 3.9 4.2 4.1 3.8 3.6  CL 109 112*  < > 112* 115* 114* 112* 113*  CO2 32 31  < > '30 30 29 27 31  ' GLUCOSE 115* 123*  < > 129* 121* 119* 108* 100*  BUN 46* 47*  < > 48* 47* 47* 38* 33*  CREATININE 1.25* 1.22  < > 1.20 1.13 1.07 1.25* 1.29*  CALCIUM 7.9* 7.9*  < > 8.2* 8.2* 7.9* 8.3* 8.4*  MG 2.5* 2.7*  --  2.5*  --  2.4  --  2.5*  PHOS 3.7 3.6  --  3.8  --  4.3  --  4.2  < > = values in this interval not displayed.  Estimated Creatinine Clearance: 90 mL/min (by C-G formula based on Cr of 1.29).  Intake/Output      12/06 0701 - 12/07 0700   I.V. (mL/kg) 276 (1.9)   NG/GT 470   IV Piggyback 117.5   Total Intake(mL/kg) 863.5 (6.1)   Urine (mL/kg/hr) 5750 (1.7)   Total Output 5750   Net -4886.5        - I/O DETAILED x24h    Total I/O In: 172.5 [I.V.:110; IV Piggyback:62.5] Out: 1400 [Urine:1400] - I/O THIS SHIFT    ASSESSMENT   eICURN Interventions   ICU Electrolyte Replacement Protocol criteria met. Labs Replaced per protocol. MD notified    ASSESSMENT: MAJOR ELECTROLYTE    Lorene Dy 11/16/2015, 6:30 AM

## 2015-11-16 NOTE — Progress Notes (Signed)
Pt refusing CPT due to it making the pt feel nauseas.  Will attempt flutter valve with pt to see if he better tolerates.  RT will continue to monitor.

## 2015-11-16 NOTE — Progress Notes (Signed)
PT Cancellation Note  Patient Details Name: Kenneth Casey MRN: [id number] DOB: 10/25/1956   Cancelled Treatment:    Reason Eval/Treat Not Completed: Patient at procedure or test/unavailable pt getting IV placed. Will follow up next available time.   [name] 11/16/2015, 1:04 PM [name] Obaloluwa Delatte, PT, DPT [phone]

## 2015-11-16 NOTE — Progress Notes (Signed)
PULMONARY / CRITICAL CARE MEDICINE   Name: Kenneth Casey MRN: [id number] DOB: 10/22/1956    ADMISSION DATE:  11/07/2015 CONSULTATION DATE:   11/07/15  [facility]ERR[facility] :  [facility]  CHIEF COMPLAINT:  Hypercarbic respiratory failure  INITIAL PRESENTATION: Kenneth Casey is a 59 y/o male with a h/o COPD who [facility] with a 1 week history of cough, fevers, and worsening SOB found to be hypoxic to 86% on RA with an initial lactic acid of 3.6 and BNP of 703. He eventually required intubation for hypercarbic respiratory failure and was stated on Levophed for hypotension.    STUDIES:  CXR 11/28: Almost complete opacification of the R lung with air bronchograms.  CT Chest w/o 11/29:  Personally reviewed by me. Dense right lung opacity with air bronchograms consistent with consolidation. TTE 11/29:  Extremely limited. Mild LVH with dilation. EF 55-60%. Grade 1 diastolic dysfunction. Cannot r/o wall motion abnormality. RV poorly visualized. No aortic stenosis or regurg. No mitral stenosis or regurg. No effusion. Port CXR 12/2:  Improving right lung opacity. ETT in good position.   SIGNIFICANT EVENTS: 11/28 - [facility], [facility]ated, started on Levo 11/28 - [facility] 11[facility] - Self extubated & reintubated by JN 12/06 - Extubated  MICROBIOLOGY  Blood Ctx 12/1:  Negative  Blood Ctx [place] 11/28:  Negative Trach Aspirate 12/1:  Oral Flora Trach Aspirate 11/30:  Oral [place] Urine Ctx 12/1:  Negative Sputum Grm Stain [place] 11/28:  Many WBC Urine Strep Ag 11/28:  Positive Urine Legionella Ag 11/28:  Negative Respiratory Viral Panel PCR 11/28:  Negative HIV 11/29:  Negative  ANTIBIOTICS Zosyn 11/28>>  Vancomycin 11/28 - 12/4 Azithromycin 11/28 - 12/1 Tamiflu 11/28 - 12/1 Levaquin 11/28 - 11/28 Ceftriaxone 11/28 - 11/28   LINES/TUBES: R IJ CVL 11/28>> Foley 11/28>> OETT 11/28 >>11/29 (self-extubation)>>11/29  (reintubation by JN) R Radial Art Line 11/28 - 12/1  SUBJECTIVE: No events overnight. Reports he is breathing comfortably on [place]. Wants to eat.   REVIEW OF SYSTEMS:  Unable to obtain due to intubation.  VITAL SIGNS: Temp:  [98.8 F (37.1 C)-100.2 F (37.9 C)] 100.1 F (37.8 C) (12/07 0730) Pulse Rate:  [87-103] 97 (12/07 0800) Resp:  [20-36] 36 (12/07 0800) BP: (104-140)/(54-77) 133/63 mmHg (12/07 0800) SpO2:  [90 %-97 %] 90 % (12/07 0800) Weight:  [313 lb 7.9 oz (142.2 kg)] 313 lb 7.9 oz (142.2 kg) (12/07 0500) HEMODYNAMICS:   VENTILATOR SETTINGS:   INTAKE / OUTPUT:  Intake/Output Summary (Last 24 hours) at 11/16/15 [id number] Last data filed at 11/16/15 0800  Gross per 24 hour  Intake  368.5 ml  Output   5450 ml  Net -5081.5 ml    PHYSICAL EXAMINATION: General: morbidly obese man sitting up in bed, NAD HEENT:  [place]/AT, EOMI, sclera anicteric, mucus membranes moist  Cardiovascular:  RRR, no m/g/r. Unable to appreciate JVD given body habitus. Pulmonary:  CTA bilaterally, breaths non-labored on 4 L oxygen via [place] Abdomen: BS+, soft, obese, non-tender  Ext: 2+ pitting edema in lower extremities bilaterally  Neurological: alert and oriented x 3. Follows commands. Moving all extremities.   LABS:  CBC  Recent Labs Lab 11/14/15 0545 11/15/15 0440 11/16/15 0530  WBC 22.5* 20.6* 22.0*  HGB 10.7* 10.7* 11.5*  HCT 35.7* 36.6* 38.1*  PLT 408* 473* 591*   Coag's No results for input(s): APTT, INR in the last 168 hours. BMET  Recent Labs Lab 11/15/15 0440 11/15/15 1729 11/16/15 0530  NA 150* 149* 151*  K 4.1 3.8 3.6  CL 114* 112* 113*  CO2 BUN 47* 38* 33*  CREATININE 1.07 1.25* 1.29*  GLUCOSE 119* 108* 100*   Electrolytes  Recent Labs Lab 11/14/15 0545  11/15/15 0440 11/15/15 1729 11/16/15 0530  CALCIUM 8.2*  < > 7.9* 8.3* 8.4*  MG 2.5*  --  2.4  --  2.5*  PHOS 3.8  --  4.3  --  4.2  < > = values in this interval not displayed. Sepsis Markers No  results for input(s): LATICACIDVEN, PROCALCITON, O2SATVEN in the last 168 hours. ABG No results for input(s): PHART, PCO2ART, PO2ART in the last 168 hours. Liver Enzymes  Recent Labs Lab 11/14/15 0545 11/15/15 0440 11/16/15 0530  ALBUMIN 1.6* 1.7* 1.8*   Cardiac Enzymes No results for input(s): TROPONINI, PROBNP in the last 168 hours. Glucose  Recent Labs Lab 11/11/15 1108 11/11/15 1527 11/11/15 1932 11/13/15 0041 11/14/15 0730 11/15/15 0735  GLUCAP 128* 132* 122* 124* 120* 119*    Imaging No results found.   ASSESSMENT / PLAN:  PULMONARY A: Acute Hypoxic Respiratory Failure - Secondary to Severe CAP. Improving. Acute Hypercarbic Respiratory Failure  Severe CAP - Improving.  P:   Extubated 12/6 Duoneb q6hr Chest PT q4hr via bed  CARDIOVASCULAR A:  Septic Shock - Shock has resolved.  P:  TTE poor quality to evaluate wall motion Monitoring on telemetry Lasix 20 mg IV x 2 doses given degree of LE edema  RENAL A:   Acute Renal Failure - Resolved. Cr slightly bumped today likely due to diuresis Hypernatremia - Stable.  P:   Trending UOP with foley catheter- consider d/c'ing Trending renal function with daily BUN/Creatinine Avoiding nephrotoxic agents Encourage free water intake for Na Lasix 20 mg IV x 2 doses   GASTROINTESTINAL A:   No acute issues   P:   Heart healthy diet  HEMATOLOGIC A:   Leukocytosis - Secondary to Sepsis. Improving. Normocytic Anemia- Hgb stable in 10-11 range Thrombocytosis- reactive   P:  Trending Leukocyte count daily with CBC SCDs Heparin [place] q8hr  INFECTIOUS A:   Septic Shock - Secondary to Severe CAP. Shock has resolved.  Severe CAP - Strep Pneumo Ag positive. Treated as outpt with Levaquin.  P:    Day #10/14 of Zosyn  Plan to re-culture for any fever Leukocytosis remaining stable at 20-22  ENDOCRINE A:   Prediabetes in 2013  - BG stable on tube feedings. Hgb A1c 6.5 11/28  P:   Monitoring BG on  daily BMP  NEUROLOGIC A:   Encephalopathy - Resolved. Secondary to sedation & toxic metabolic encephalopathy.  P:   Continue to monitor    FAMILY  - Updates: Wife & daughter updated at bedside by JN on 12/6.  TODAY'S SUMMARY: 59 year old morbidly obese man admitted with severe community acquired pneumonia and acute hypoxic respiratory failure. Successfully extubated on 12/6. PT/OT to work with him today.    Rich Number, MD, MPH Internal Medicine Resident, PGY-II Pager: [phone]   Attending:  I have seen and examined the patient with nurse practitioner/resident and agree with the note above.  My edits are in BOLD above.  We formulated the plan together and I elicited the following history.    Kenneth Casey has improved significantly, still dyspneic, hypoxemic but improving  On exam: few crackles R lung, clear on left; significant edema in legs bilaterally  CAP> antibiotic resistant, now completed 10 days of IV antibiotics will  observe off of treatment; will repeat CXR given persistent leukocytosis PULM edema/anasarca> more lasix today  Transfer out of ICU, TRH service, PCCM follow as consultant  Heber [facility], MD [place] [facility] Pager: [phone] Cell: [phone] After 3pm or if no response, call [phone]

## 2015-11-16 NOTE — Progress Notes (Signed)
OT Cancellation Note  Patient Details Name: Kenneth Casey MRN: [id number] DOB: 07/05/1956   Cancelled Treatment:    Reason Eval/Treat Not Completed: Patient at procedure or test/ unavailable. Pt getting an IV. Will return later if available.   [facility]ARY  Lailana Shira, OTR/L  [phone] 11/16/2015 11/16/2015, 1:11 PM

## 2015-11-16 NOTE — Progress Notes (Signed)
eLink Physician-Brief Progress Note Patient Name: Kenneth Casey DOB: 09/21/1956 MRN: [id number]   Date of Service  11/16/2015  HPI/Events of Note  rash  eICU Interventions  Ordered benadryl.  Prn.      Intervention Category Intermediate Interventions: Other:  Shane CrutchPradeep Bronwyn Belasco 11/16/2015, 1:20 AM

## 2015-11-16 NOTE — Progress Notes (Signed)
[facility] for transfer of pt to 5W18.

## 2015-11-16 NOTE — Progress Notes (Signed)
Occupational Therapy Evaluation Patient Details Name: Kenneth Casey Holaday MRN: [id number] DOB: 10/25/1956 Today's Date: 11/16/2015    History of Present Illness 59 y/o male with a h/o COPD who presented [facility] with a 1 week history of cough, fevers, and worsening SOB found to be hypoxic to 86% on RA with an initial lactic acid of 3.6 and BNP of 703. He eventually required intubation for hypercarbic respiratory failure and was stated on Levophed for hypotension. Self extubated 11/29 and reintubated. Extubated 12/06.    Clinical Impression   PTA, pt independent with ADL and mobility and worked full time (self employed). Pt presents with significant weakness. RR increased to 39 with O2 90-93 on 4L O2. Able to take @ 8 steps to chair. Other VSS. Pt will benefit from acute OT services to facilitate safe D/C home with 24/7 S.     Follow Up Recommendations  Supervision/Assistance - 24 hour;Home health OT    Equipment Recommendations  3 in 1 bedside comode    Recommendations for Other Services       Precautions / Restrictions Precautions Precautions: Fall Precaution Comments: watch O2 Restrictions Weight Bearing Restrictions: No      Mobility Bed Mobility Overal bed mobility: +2 for physical assistance;Needs Assistance Bed Mobility: Supine to Sit     Supine to sit: Mod assist;+2 for physical assistance;HOB elevated (heavy reliance on rails)     General bed mobility comments: Assit to elevate trunk from side -lying  Transfers Overall transfer level: Needs assistance Equipment used: 2 person hand held assist Transfers: Sit to/from Stand;Stand Pivot Transfers Sit to Stand: Mod assist;+2 physical assistance - difficulty with powering up from sitting.  Stand pivot transfers: Min assist;+2 physical assistance       General transfer comment: Assist to power up. Once up, min A +2 to take several steps to chair.    Balance Overall balance assessment: Needs  assistance Sitting-balance support: Feet supported Sitting balance-Leahy Scale: Fair     Standing balance support: Bilateral upper extremity supported;During functional activity Standing balance-Leahy Scale: Poor                              ADL Overall ADL's : Needs assistance/impaired Eating/Feeding: Modified independent   Grooming: Set up   Upper Body Bathing: Minimal assitance;Sitting   Lower Body Bathing: Maximal assistance;Sit to/from stand   Upper Body Dressing : Minimal assistance   Lower Body Dressing: Maximal assistance;Sit to/from stand   Toilet Transfer: +2 for physical assistance;Moderate assistance   Toileting- Clothing Manipulation and Hygiene: Total assistance Toileting - Clothing Manipulation Details (indicate cue type and reason): foley/incontinent of BM     Functional mobility during ADLs: +2 for physical assistance;Moderate assistance General ADL Comments: Pt unable to reach feet at this time. May benefit from AE. SOB with increased RR with minimal activity.      Vision     Perception     Praxis      Pertinent Vitals/Pain Pain Assessment: No/denies pain     Hand Dominance Right   Extremity/Trunk Assessment Upper Extremity Assessment Upper Extremity Assessment: Generalized weakness   Lower Extremity Assessment Lower Extremity Assessment: Defer to PT evaluation   Cervical / Trunk Assessment Cervical / Trunk Assessment: Normal   Communication Communication Communication: No difficulties   Cognition Arousal/Alertness: Awake/alert Behavior During Therapy: WFL for tasks assessed/performed Overall Cognitive Status: Within Functional Limits for tasks assessed  General Comments   Skin red on back. Encouraged pt to sit up OOB x 1 hr    Exercises       Shoulder Instructions      Home Living Family/patient expects to be discharged to:: Private residence Living Arrangements: Spouse/significant  other Available Help at Discharge: Family;Available 24 hours/day Type of Home: House Home Access: Stairs to enter [employer] of Steps: 3 Entrance Stairs-Rails: Right Home Layout: One level     Bathroom Shower/Tub: Producer, television/film/video: Standard Bathroom Accessibility: Yes How Accessible: Accessible via walker Home Equipment: Walker - 2 wheels;Wheelchair - manual;Shower seat (has accessibility to [employer])          Prior Functioning/Environment Level of Independence: Independent        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    OT Diagnosis: Generalized weakness   OT Problem List: Decreased strength;Decreased activity tolerance;Impaired balance (sitting and/or standing);Decreased knowledge of use of DME or AE;Cardiopulmonary status limiting activity;Obesity;Increased edema   OT Treatment/Interventions: Self-care/ADL training;Therapeutic exercise;Energy conservation;DME and/or AE instruction;Therapeutic activities;Patient/family education;Balance training    OT Goals(Current goals can be found in the care plan section) Acute Rehab OT Goals Patient Stated Goal: to go home OT Goal Formulation: With patient Time For Goal Achievement: 11/30/15 Potential to Achieve Goals: Good ADL Goals Pt Will Perform Lower Body Bathing: with min guard assist;with adaptive equipment;sit to/from stand Pt Will Perform Lower Body Dressing: with min guard assist;with adaptive equipment Pt Will Transfer to Toilet: with supervision;bedside commode;ambulating Pt Will Perform Toileting - Clothing Manipulation and hygiene: with modified independence;sit to/from stand Pt/caregiver will Perform Home Exercise Program: Both right and left upper extremity;With theraband;With written HEP provided;Increased strength (level 1) Additional ADL Goal #1: Pt will verbalize 3 energy conservation techniques for ADL independently  OT Frequency: Min 2X/week   Barriers to D/C:             Co-evaluation PT/OT/SLP Co-Evaluation/Treatment: Yes Reason for Co-Treatment: Complexity of the patient's impairments (multi-system involvement);For patient/therapist safety   OT goals addressed during session: ADL's and self-care      End of Session Equipment Utilized During Treatment: Oxygen (4L) Nurse Communication: Mobility status  Activity Tolerance: Patient tolerated treatment well Patient left: in chair;with call bell/phone within reach   Time: 1430-1454 OT Time Calculation (min): 24 min Charges:  OT General Charges $OT Visit: 1 Procedure OT Evaluation $Initial OT Evaluation Tier I: 1 Procedure G-Codes:    Otillia Cordone,HILLARY 12-07-15, 3:12 PM   [facility], OTR/L  [phone] 12/07/2015

## 2015-11-16 NOTE — Evaluation (Signed)
Physical Therapy Evaluation Patient Details Name: Kenneth Casey MRN: [id number] DOB: 10/25/1956 Today's Date: 11/16/2015   History of Present Illness  59 y/o male with a h/o COPD who [facility] with a 1 week history of cough, fevers, and worsening SOB found to be hypoxic to 86% on RA with an initial lactic acid of 3.6 and BNP of 703. He eventually required intubation for hypercarbic respiratory failure and was stated on Levophed for hypotension. Self extubated 11/29 and reintubated. Extubated 12/06.   Clinical Impression  Patient presents with functional limitations due to deficits listed in PT problem list (see below). Pt with marked weakness, balance deficits and impaired endurance as exhibited by increased RR during mobility. Education re: pursed lip breathing. Sp02 ranged from 88-93% on 4L/min 02. Pt requires Min-Mod assist of 2 for bed mobility and taking a few steps to chair. Will follow acutely to maximize independence and mobility prior to return home.    Follow Up Recommendations Home health PT;Supervision/Assistance - 24 hour    Equipment Recommendations  None recommended by PT    Recommendations for Other Services OT consult     Precautions / Restrictions Precautions Precautions: Fall Precaution Comments: watch O2 Restrictions Weight Bearing Restrictions: No      Mobility  Bed Mobility Overal bed mobility: Needs Assistance Bed Mobility: Supine to Sit     Supine to sit: Mod assist;+2 for physical assistance;HOB elevated     General bed mobility comments: Assist to elevate trunk from side lying.  Transfers Overall transfer level: Needs assistance Equipment used: 2 person hand held assist Transfers: Sit to/from Stand Sit to Stand: Mod assist;+2 physical assistance Stand pivot transfers: Min assist;+2 physical assistance       General transfer comment: Assist to boost to standing position. + dizziness. Transferred to  chair.  Ambulation/Gait Ambulation/Gait assistance: Min assist;+2 physical assistance Ambulation Distance (Feet): 6 Feet Assistive device: Rolling walker (2 wheeled) Gait Pattern/deviations: Step-through pattern;Decreased stride length   Gait velocity interpretation: <1.8 ft/sec, indicative of risk for recurrent falls General Gait Details: Able to take a few steps to chair with Min A of 2 for balance.   Stairs            Wheelchair Mobility    Modified Rankin (Stroke Patients Only)       Balance Overall balance assessment: Needs assistance Sitting-balance support: Feet supported Sitting balance-Leahy Scale: Fair     Standing balance support: During functional activity;Bilateral upper extremity supported Standing balance-Leahy Scale: Poor Standing balance comment: Requires BUE support during static and dynamic standing.                             Pertinent Vitals/Pain Pain Assessment: No/denies pain            Supine BP 120/71, Sitting BP 129/62 + dizziness, Post transfer to chair BP 128/68           HR stable, RR increased 32 during activity. 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  Hand Dominance   Dominant Hand: Right    Extremity/Trunk Assessment   Upper Extremity Assessment: Defer to OT evaluation           Lower Extremity Assessment: Generalized weakness      Cervical / Trunk Assessment: Normal  Communication   Communication: No difficulties   Cognition Arousal/Alertness: Awake/alert Behavior During Therapy: WFL for tasks assessed/performed Overall Cognitive Status: Within Functional Limits for tasks assessed                      General Comments      Exercises        Assessment/Plan    PT Assessment Patient needs continued PT services  PT Diagnosis Difficulty walking;Generalized weakness   PT Problem List Decreased strength;Cardiopulmonary status limiting activity;Decreased activity tolerance;Decreased balance;Decreased mobility  PT Treatment Interventions Balance training;Gait training;Functional mobility training;Therapeutic exercise;Therapeutic activities;Patient/family education   PT Goals (Current goals can be found in the Care Plan section) Acute Rehab PT Goals Patient Stated Goal: to go home PT Goal Formulation: With patient Time For Goal Achievement: 11/30/15 Potential to Achieve Goals: Good    Frequency Min 3X/week   Barriers to discharge Inaccessible home environment 3 steps to enter home    Co-evaluation PT/OT/SLP Co-Evaluation/Treatment: Yes Reason for Co-Treatment: Complexity of the patient's impairments (multi-system involvement);For patient/therapist safety PT goals addressed during session: Mobility/safety with mobility OT goals addressed during session: ADL's and self-care       End of Session Equipment Utilized During Treatment: Oxygen Activity Tolerance: Patient limited by fatigue;Patient tolerated treatment well Patient left: in chair;with call bell/phone within reach Nurse Communication: Mobility status         Time: 1610-9604 PT Time Calculation (min) (ACUTE ONLY): 26 min   Charges:   PT Evaluation $Initial PT Evaluation Tier I: 1 Procedure     PT G Codes:        [name] A [name] 11/16/2015, 3:39 PM  Mylo Red, PT, DPT [phone]

## 2015-11-17 ENCOUNTER — Inpatient Hospital Stay (HOSPITAL_COMMUNITY): Payer: Self-pay

## 2015-11-17 DIAGNOSIS — J9601 Acute respiratory failure with hypoxia: Secondary | ICD-10-CM | POA: Insufficient documentation

## 2015-11-17 DIAGNOSIS — J449 Chronic obstructive pulmonary disease, unspecified: Secondary | ICD-10-CM

## 2015-11-17 LAB — CBC WITH DIFFERENTIAL/PLATELET
BASOS ABS: 0.1 10*3/uL (ref 0.0–0.1)
Basophils Relative: 0 %
Eosinophils Absolute: 0.5 10*3/uL (ref 0.0–0.7)
Eosinophils Relative: 2 %
HEMATOCRIT: 40 % (ref 39.0–52.0)
HEMOGLOBIN: 12.1 g/dL — AB (ref 13.0–17.0)
LYMPHS ABS: 2.4 10*3/uL (ref 0.7–4.0)
LYMPHS PCT: 11 %
MCH: 28.5 pg (ref 26.0–34.0)
MCHC: 30.3 g/dL (ref 30.0–36.0)
MCV: 94.3 fL (ref 78.0–100.0)
Monocytes Absolute: 0.5 10*3/uL (ref 0.1–1.0)
Monocytes Relative: 2 %
NEUTROS ABS: 18.4 10*3/uL — AB (ref 1.7–7.7)
Neutrophils Relative %: 85 %
Platelets: 603 10*3/uL — ABNORMAL HIGH (ref 150–400)
RBC: 4.24 MIL/uL (ref 4.22–5.81)
RDW: 16.2 % — ABNORMAL HIGH (ref 11.5–15.5)
WBC: 21.9 10*3/uL — AB (ref 4.0–10.5)

## 2015-11-17 LAB — RENAL FUNCTION PANEL
ANION GAP: 9 (ref 5–15)
Albumin: 2 g/dL — ABNORMAL LOW (ref 3.5–5.0)
BUN: 25 mg/dL — ABNORMAL HIGH (ref 6–20)
CHLORIDE: 106 mmol/L (ref 101–111)
CO2: 29 mmol/L (ref 22–32)
CREATININE: 1.13 mg/dL (ref 0.61–1.24)
Calcium: 8.6 mg/dL — ABNORMAL LOW (ref 8.9–10.3)
Glucose, Bld: 100 mg/dL — ABNORMAL HIGH (ref 65–99)
POTASSIUM: 3.6 mmol/L (ref 3.5–5.1)
Phosphorus: 4.8 mg/dL — ABNORMAL HIGH (ref 2.5–4.6)
Sodium: 144 mmol/L (ref 135–145)

## 2015-11-17 LAB — MAGNESIUM: MAGNESIUM: 2.2 mg/dL (ref 1.7–2.4)

## 2015-11-17 MED ORDER — PIPERACILLIN-TAZOBACTAM 3.375 G IVPB
3.3750 g | Freq: Three times a day (TID) | INTRAVENOUS | Status: DC
  Filled 2015-11-17 (×2): qty 50

## 2015-11-17 MED ORDER — BENZONATATE 100 MG PO CAPS
100.0000 mg | ORAL_CAPSULE | Freq: Three times a day (TID) | ORAL | Status: DC
  Administered 2015-11-17 – 2015-11-18 (×4): 100 mg via ORAL
  Filled 2015-11-17 (×4): qty 1

## 2015-11-17 MED ORDER — FUROSEMIDE 10 MG/ML IJ SOLN: 40.0000 mg | Freq: Every day | INTRAMUSCULAR | Status: DC

## 2015-11-17 MED ORDER — FUROSEMIDE 10 MG/ML IJ SOLN
40.0000 mg | Freq: Every day | INTRAMUSCULAR | Status: DC
  Administered 2015-11-17: 40 mg via INTRAVENOUS
  Filled 2015-11-17: qty 4

## 2015-11-17 MED ORDER — IPRATROPIUM-ALBUTEROL 0.5-2.5 (3) MG/3ML IN SOLN: 3.0000 mL | Freq: Four times a day (QID) | RESPIRATORY_TRACT | Status: DC | PRN

## 2015-11-17 MED ORDER — BUDESONIDE-FORMOTEROL FUMARATE 80-4.5 MCG/ACT IN AERO
2.0000 | INHALATION_SPRAY | Freq: Two times a day (BID) | RESPIRATORY_TRACT | Status: DC
  Administered 2015-11-17 – 2015-11-18 (×3): 2 via RESPIRATORY_TRACT
  Filled 2015-11-17: qty 6.9

## 2015-11-17 MED ORDER — PREDNISONE 20 MG PO TABS
40.0000 mg | ORAL_TABLET | Freq: Every day | ORAL | Status: DC
  Administered 2015-11-17 – 2015-11-18 (×2): 40 mg via ORAL
  Filled 2015-11-17 (×2): qty 2

## 2015-11-17 MED ORDER — AMOXICILLIN-POT CLAVULANATE 875-125 MG PO TABS
1.0000 | ORAL_TABLET | Freq: Two times a day (BID) | ORAL | Status: DC
  Administered 2015-11-17 – 2015-11-18 (×3): 1 via ORAL
  Filled 2015-11-17 (×3): qty 1

## 2015-11-17 NOTE — Progress Notes (Signed)
Transferred patient and all patient belongings to 5W Room 18.

## 2015-11-17 NOTE — Progress Notes (Signed)
Nutrition Follow-up  DOCUMENTATION CODES:   Morbid obesity  INTERVENTION:  Pt extubated, no longer receiving tube feed   NUTRITION DIAGNOSIS:   Inadequate oral intake related to inability to eat as evidenced by NPO status.  Is now: Inadequate oral intake related to poor appetite, taste changes, as evidenced by patient/family report  GOAL:   Provide needs based on ASPEN/SCCM guidelines  Is now: Pt will meet 90% of their needs or greater.  MONITOR:   Vent status, TF tolerance, Labs, Weight trends, Skin, I & O's  REASON FOR ASSESSMENT:   Consult Enteral/tube feeding initiation and management  ASSESSMENT:   59 y/o Male with a h/o COPD who [facility] with a 1 week history of cough, fevers, and worsening SOB found to be hypoxic to 86% on RA with an initial lactic acid of 3.6 and BNP of 703. He eventually required intubation for hypercarbic respiratory failure and was started on Levophed for hypotension.CXR on 11/28 showed almost complete opacification of the R lung with air bronchograms.  Pt was intubated, on VHP @ 70 w/ Pro Stat 30 ml TID. Extubated as of 12/6  Spoke with pt at bedside, reports poor appetite, saying he can "taste the saline" in everything. Also admits to diarrhea.  ONS would not be warranted at this point. Continue to follow for PO intake.  Diet Order:  Diet Heart Room service appropriate?: Yes; Fluid consistency:: Thin  Skin:  Wound (see comment) (Stage II to sacrum)  Last BM:  12/5  Height:   Ht Readings from Last 1 Encounters:  11/07/15 5\' 10"  (1.778 m)    Weight:   Wt Readings from Last 1 Encounters:  11/17/15 307 lb 15.7 oz (139.7 kg)    Ideal Body Weight:  75.5 kg  BMI:  Body mass index is 44.19 kg/(m^2).  Estimated Nutritional Needs:   Kcal:  1610-96041584-2010  Protein:  188 gm  Fluid:  >/= 2 L  EDUCATION NEEDS:   No education needs identified at this time  Dionne AnoWilliam M. Reilly Molchan, MS, RD LDN After  Hours/Weekend Pager [phone]

## 2015-11-17 NOTE — Progress Notes (Signed)
ANTIBIOTIC CONSULT NOTE - FOLLOW UP  Pharmacy Consult for Zosyn Indication: pneumonia  No Known Allergies  Patient Measurements: Height: 5\' 10"  (177.8 cm) Weight: (!) 307 lb 15.7 oz (139.7 kg) IBW/kg (Calculated) : 73  Vital Signs: Temp: 98 F (36.7 C) (12/08 0543) Temp Source: Oral (12/08 0543) BP: 129/67 mmHg (12/08 0543) Pulse Rate: 93 (12/08 0543) Intake/Output from previous day: 12/07 0701 - 12/08 0700 In: 665 [P.O.:480; I.V.:160; IV Piggyback:25] Out: 4577 [Urine:4575; Stool:2] Intake/Output from this shift:    Labs:  Recent Labs  11/15/15 0440 11/15/15 1729 11/16/15 0530 11/17/15 0730  WBC 20.6*  --  22.0* 21.9*  HGB 10.7*  --  11.5* 12.1*  PLT 473*  --  591* 603*  CREATININE 1.07 1.25* 1.29* 1.13   Estimated Creatinine Clearance: 101.7 mL/min (by C-G formula based on Cr of 1.13). No results for input(s): VANCOTROUGH, VANCOPEAK, VANCORANDOM, GENTTROUGH, GENTPEAK, GENTRANDOM, TOBRATROUGH, TOBRAPEAK, TOBRARND, AMIKACINPEAK, AMIKACINTROU, AMIKACIN in the last 72 hours.   Microbiology: Recent Results (from the past 720 hour(s))  MRSA PCR Screening     Status: Abnormal   Collection Time: 11/07/15  7:02 PM  Result Value Ref Range Status   MRSA by PCR POSITIVE (A) NEGATIVE Final    Comment:        The GeneXpert MRSA Assay (FDA approved for NASAL specimens only), is one component of a comprehensive MRSA colonization surveillance program. It is not intended to diagnose MRSA infection nor to guide or monitor treatment for MRSA infections. RESULT CALLED TO, READ BACK BY AND VERIFIED WITH: D HOVANDER RN 2036 11/07/15 A BROWNING   Culture, blood (routine x 2)     Status: None   Collection Time: 11/07/15  7:35 PM  Result Value Ref Range Status   Specimen Description BLOOD LEFT ANTECUBITAL  Final   Special Requests BOTTLES DRAWN AEROBIC AND ANAEROBIC 7CC  Final   Culture NO GROWTH 5 DAYS  Final   Report Status 11/12/2015 FINAL  Final  Culture, blood (routine  x 2)     Status: None   Collection Time: 11/07/15  7:49 PM  Result Value Ref Range Status   Specimen Description BLOOD LEFT ARM  Final   Special Requests IN PEDIATRIC BOTTLE 2CC  Final   Culture NO GROWTH 5 DAYS  Final   Report Status 11/12/2015 FINAL  Final  Respiratory virus panel     Status: None   Collection Time: 11/07/15 11:01 PM  Result Value Ref Range Status   Source - RVPAN NASOPHARYNGEAL  Corrected   Respiratory Syncytial Virus A Negative Negative Final   Respiratory Syncytial Virus B Negative Negative Final   Influenza A Negative Negative Final   Influenza B Negative Negative Final   Parainfluenza 1 Negative Negative Final   Parainfluenza 2 Negative Negative Final   Parainfluenza 3 Negative Negative Final   Metapneumovirus Negative Negative Final   Rhinovirus Negative Negative Final   Adenovirus Negative Negative Final    Comment: (NOTE) Performed At: [facility] [address] [place], [place] [id number] Mila HomerHancock William F MD WU:[id number]:[phone]   Gram stain     Status: None   Collection Time: 11/08/15 12:19 AM  Result Value Ref Range Status   Specimen Description TRACHEAL ASPIRATE  Final   Special Requests Normal  Final   Gram Stain   Final    ABUNDANT WBC PRESENT,BOTH PMN AND MONONUCLEAR CORRECTED ON 11/29 AT 0936: PREVIOUSLY REPORTED AS MODERATE WBC PRESENT, PREDOMINANTLY MONONUCLEAR NO ORGANISMS SEEN CORRECTED RESULTS CALLED TO: L  ROBBINS,RN AT 0940 11/08/15 BY L BENFIELD    Report Status 11/08/2015 FINAL  Final  Culture, respiratory (NON-Expectorated)     Status: None   Collection Time: 11/09/15 10:32 AM  Result Value Ref Range Status   Specimen Description TRACHEAL ASPIRATE  Final   Special Requests Normal  Final   Gram Stain   Final    ABUNDANT WBC PRESENT,BOTH PMN AND MONONUCLEAR FEW SQUAMOUS EPITHELIAL CELLS PRESENT NO ORGANISMS SEEN Performed at [facility]    Culture   Final    RARE YEAST CONSISTENT WITH CANDIDA SPECIES Performed at  [facility]    Report Status 11/12/2015 FINAL  Final  Culture, blood (routine x 2)     Status: None   Collection Time: 11/10/15 11:44 AM  Result Value Ref Range Status   Specimen Description BLOOD RIGHT HAND  Final   Special Requests BOTTLES DRAWN AEROBIC ONLY  5CC  Final   Culture NO GROWTH 5 DAYS  Final   Report Status 11/15/2015 FINAL  Final  Culture, blood (routine x 2)     Status: None   Collection Time: 11/10/15 11:50 AM  Result Value Ref Range Status   Specimen Description BLOOD LEFT HAND  Final   Special Requests BOTTLES DRAWN AEROBIC ONLY 5CC  Final   Culture NO GROWTH 5 DAYS  Final   Report Status 11/15/2015 FINAL  Final  Culture, respiratory (NON-Expectorated)     Status: None   Collection Time: 11/10/15 12:35 PM  Result Value Ref Range Status   Specimen Description TRACHEAL ASPIRATE  Final   Special Requests Normal  Final   Gram Stain   Final    ABUNDANT WBC PRESENT, PREDOMINANTLY PMN NO SQUAMOUS EPITHELIAL CELLS SEEN NO ORGANISMS SEEN Performed at [facility]    Culture   Final    FEW YEAST CONSISTENT WITH CANDIDA SPECIES Performed at [facility]    Report Status 11/13/2015 FINAL  Final  Culture, Urine     Status: None   Collection Time: 11/10/15  1:45 PM  Result Value Ref Range Status   Specimen Description URINE, CATHETERIZED  Final   Special Requests Normal  Final   Culture NO GROWTH 1 DAY  Final   Report Status 11/11/2015 FINAL  Final    Assessment: 59 y/o M presented to OSH w/ acute on chronic hypercapnia that required intubation then transferred to [facility]. Pt reported 1 week hx of cough, fever, and worsening SOB.   To continue Zosyn after stopping 12/7  Goal of Therapy:  Vancomycin trough level 15-20 mcg/ml  Plan:  Zosyn 3.375g IV q8h Monitor renal function, cultures, LOT  Thank you Okey Regal, PharmD [phone]  11/17/2015 9:12 AM

## 2015-11-17 NOTE — Progress Notes (Signed)
[facility]                                                                              Patient Demographics  Kenneth Casey, is a 59 y.o. male, DOB - 10/01/1956, GEX:[id number]  Admit date - 11/07/2015   Admitting Physician Alyson ReedyWesam G Yacoub, MD  Outpatient Primary MD for the patient is No PCP Per Patient  LOS - 10   No chief complaint on file.      Brief HPI   Patient was admitted to ICU by critical care service on 11/28, per admit note [name] is a 59 y/o male with a h/o COPD who presented [facility] with a 1 week history of cough, fevers, and worsening SOB found to be hypoxic to 86% on RA with an initial lactic acid of 3.6 and BNP of 703. He eventually required intubation for hypercarbic respiratory failure and was stated on Levophed for hypotension.  Patient transferred to hospitalist service on 12/816 to the floor.  Assessment & Plan    Principal Problem:   Acute hypoxic and hypercarbiac respiratory failure with severe CAP (community acquired pneumonia) - Patient was admitted to intensive care unit, [facility], patient had been [facility] and was started on vasopressors, [facility]. Eventually patient was extubated on 12/6 and has been doing well. - Continue Symbicort, DuoNeb's, prednisone with taper - Urine strep antigen was positive. Patient received 10/14 days off Zosyn, will add 4 more days of oral Augmentin to finish off the course - Chest x-ray 12/8 showed improvement in a radiation to the right lung with persistent volume loss - 2-D echo had shown EF of 55-60% with grade 1 diastolic dysfunction (still has significant peripheral edema), patient was placed on IV Lasix x 2 doses   Active Problems:    Septic shock (HCC) secondary to severe CAP -Currently resolved, blood cultures negative, patient had required vasopressors -  reculture If spikes any fevers or respiratory status  worsens - Leukocytosis is improving    Acute kidney injury (HCC) - Improving, creatinine 1.13    Encephalopathy acute - Resolved likely due to septic shock and acute hypercarbic respiratory failure  Generalized debility - will have PT to evaluate patient, he is not interested in skilled nursing facility  Code Status:Full CODE STATUS  Family Communication: Discussed in detail with the patient, all imaging results, lab results explained to the patient     Disposition Plan: Hopefully DC home tomorrow if improving  Time Spent in minutes   25 minutes  Procedures  Intubation  Consults   Patient was admitted by CCM  DVT Prophylaxis heparin subcutaneous  Medications  Scheduled Meds: . amoxicillin-clavulanate  1 tablet Oral Q12H  . benzonatate  100 mg Oral TID  . budesonide-formoterol  2 puff Inhalation BID  . furosemide  40 mg Intravenous Daily  . heparin subcutaneous  5,000 Units Subcutaneous 3 times per day  . predniSONE  40 mg Oral Q breakfast   Continuous Infusions:  PRN Meds:.acetaminophen (TYLENOL) oral liquid 160 mg/5 mL, antiseptic oral rinse, diphenhydrAMINE, ipratropium-albuterol   Antibiotics  Anti-infectives    Start     Dose/Rate Route Frequency Ordered Stop   11/17/15 1000  piperacillin-tazobactam (ZOSYN) IVPB 3.375 g  Status:  Discontinued     3.375 g 12.5 mL/hr over 240 Minutes Intravenous Every 8 hours 11/17/15 0911 11/17/15 0958   11/17/15 1000  amoxicillin-clavulanate (AUGMENTIN) 875-125 MG per tablet 1 tablet     1 tablet Oral Every 12 hours 11/17/15 0958     11/10/15 1200  vancomycin (VANCOCIN) IVPB 1000 mg/200 mL premix  Status:  Discontinued     1,000 mg 200 mL/hr over 60 Minutes Intravenous Every 12 hours 11/10/15 1141 11/13/15 1121   11/08/15 1130  vancomycin (VANCOCIN) IVPB 1000 mg/200 mL premix  Status:  Discontinued     1,000 mg 200 mL/hr over 60 Minutes Intravenous Every 12 hours 11/08/15 0956 11/09/15 1545   11/08/15 1000  levofloxacin  (LEVAQUIN) IVPB 750 mg  Status:  Discontinued     750 mg 100 mL/hr over 90 Minutes Intravenous Every 24 hours 11/07/15 1952 11/07/15 2157   11/08/15 0800  azithromycin (ZITHROMAX) 500 mg in dextrose 5 % 250 mL IVPB  Status:  Discontinued     500 mg 250 mL/hr over 60 Minutes Intravenous Every 24 hours 11/07/15 2157 11/10/15 1033   11/07/15 2300  oseltamivir (TAMIFLU) 6 MG/ML suspension 75 mg  Status:  Discontinued     75 mg Oral 2 times daily 11/07/15 2244 11/10/15 1033   11/07/15 2200  vancomycin (VANCOCIN) 2,000 mg in sodium chloride 0.9 % 500 mL IVPB     2,000 mg 250 mL/hr over 120 Minutes Intravenous  Once 11/07/15 2159 11/08/15 0036   11/07/15 2100  piperacillin-tazobactam (ZOSYN) IVPB 3.375 g  Status:  Discontinued     3.375 g 12.5 mL/hr over 240 Minutes Intravenous Every 8 hours 11/07/15 2048 11/16/15 1121        Subjective:   Kenneth Casey was seen and examined today.  Feels a lot better today, shortness of breath is improving, no fevers or chills. Patient denies dizziness,  abdominal pain, N/V/D/C, new weakness, numbess, tingling. No acute events overnight.    Objective:   Blood pressure 129/67, pulse 93, temperature 98 F (36.7 C), temperature source Oral, resp. rate 20, height 5\' 10"  (1.778 m), weight 139.7 kg (307 lb 15.7 oz), SpO2 97 %.  Wt Readings from Last 3 Encounters:  11/17/15 139.7 kg (307 lb 15.7 oz)     Intake/Output Summary (Last 24 hours) at 11/17/15 1318 Last data filed at 11/17/15 1151  Gross per 24 hour  Intake    580 ml  Output   2457 ml  Net  -1877 ml    Exam  General: Alert and oriented x 3, NAD  HEENT:  PERRLA, EOMI, Anicteric Sclera, mucous membranes moist.   Neck: Supple, no JVD, no masses  CVS: S1 S2 auscultated, no rubs, murmurs or gallops. Regular rate and rhythm.  Respiratory: Bilateral rhonchi   Abdomen: Soft, nontender, nondistended, + bowel sounds  Ext: no cyanosis clubbing, 1-2+ edema  Neuro: AAOx3, Cr N's II- XII.  Strength 5/5 upper and lower extremities bilaterally  Skin: No rashes  Psych: Normal affect and demeanor, alert and oriented x3    Data Review   Micro Results Recent Results (from the past 240 hour(s))  MRSA PCR Screening     Status: Abnormal   Collection Time: 11/07/15  7:02 PM  Result Value Ref Range Status   MRSA by PCR POSITIVE (A) NEGATIVE Final  Comment:        The GeneXpert MRSA Assay (FDA approved for NASAL specimens only), is one component of a comprehensive MRSA colonization surveillance program. It is not intended to diagnose MRSA infection nor to guide or monitor treatment for MRSA infections. RESULT CALLED TO, READ BACK BY AND VERIFIED WITH: D HOVANDER RN 2036 11/07/15 A BROWNING   Culture, blood (routine x 2)     Status: None   Collection Time: 11/07/15  7:35 PM  Result Value Ref Range Status   Specimen Description BLOOD LEFT ANTECUBITAL  Final   Special Requests BOTTLES DRAWN AEROBIC AND ANAEROBIC 7CC  Final   Culture NO GROWTH 5 DAYS  Final   Report Status 11/12/2015 FINAL  Final  Culture, blood (routine x 2)     Status: None   Collection Time: 11/07/15  7:49 PM  Result Value Ref Range Status   Specimen Description BLOOD LEFT ARM  Final   Special Requests IN PEDIATRIC BOTTLE 2CC  Final   Culture NO GROWTH 5 DAYS  Final   Report Status 11/12/2015 FINAL  Final  Respiratory virus panel     Status: None   Collection Time: 11/07/15 11:01 PM  Result Value Ref Range Status   Source - RVPAN NASOPHARYNGEAL  Corrected   Respiratory Syncytial Virus A Negative Negative Final   Respiratory Syncytial Virus B Negative Negative Final   Influenza A Negative Negative Final   Influenza B Negative Negative Final   Parainfluenza 1 Negative Negative Final   Parainfluenza 2 Negative Negative Final   Parainfluenza 3 Negative Negative Final   Metapneumovirus Negative Negative Final   Rhinovirus Negative Negative Final   Adenovirus Negative Negative Final    Comment:  (NOTE) Performed At: [facility] [address] [place], [place] [id number] Mila Homer MD WU:[id number]   Gram stain     Status: None   Collection Time: 11/08/15 12:19 AM  Result Value Ref Range Status   Specimen Description TRACHEAL ASPIRATE  Final   Special Requests Normal  Final   Gram Stain   Final    ABUNDANT WBC PRESENT,BOTH PMN AND MONONUCLEAR CORRECTED ON 11/29 AT 0936: PREVIOUSLY REPORTED AS MODERATE WBC PRESENT, PREDOMINANTLY MONONUCLEAR NO ORGANISMS SEEN CORRECTED RESULTS CALLED TO: L ROBBINS,RN AT 0940 11/08/15 BY L BENFIELD    Report Status 11/08/2015 FINAL  Final  Culture, respiratory (NON-Expectorated)     Status: None   Collection Time: 11/09/15 10:32 AM  Result Value Ref Range Status   Specimen Description TRACHEAL ASPIRATE  Final   Special Requests Normal  Final   Gram Stain   Final    ABUNDANT WBC PRESENT,BOTH PMN AND MONONUCLEAR FEW SQUAMOUS EPITHELIAL CELLS PRESENT NO ORGANISMS SEEN Performed at [facility]    Culture   Final    RARE YEAST CONSISTENT WITH CANDIDA SPECIES Performed at [facility]    Report Status 11/12/2015 FINAL  Final  Culture, blood (routine x 2)     Status: None   Collection Time: 11/10/15 11:44 AM  Result Value Ref Range Status   Specimen Description BLOOD RIGHT HAND  Final   Special Requests BOTTLES DRAWN AEROBIC ONLY  5CC  Final   Culture NO GROWTH 5 DAYS  Final   Report Status 11/15/2015 FINAL  Final  Culture, blood (routine x 2)     Status: None   Collection Time: 11/10/15 11:50 AM  Result Value Ref Range Status   Specimen Description BLOOD LEFT HAND  Final   Special  Requests BOTTLES DRAWN AEROBIC ONLY 5CC  Final   Culture NO GROWTH 5 DAYS  Final   Report Status 11/15/2015 FINAL  Final  Culture, respiratory (NON-Expectorated)     Status: None   Collection Time: 11/10/15 12:35 PM  Result Value Ref Range Status   Specimen Description TRACHEAL ASPIRATE  Final   Special Requests Normal  Final     Gram Stain   Final    ABUNDANT WBC PRESENT, PREDOMINANTLY PMN NO SQUAMOUS EPITHELIAL CELLS SEEN NO ORGANISMS SEEN Performed at [facility]    Culture   Final    FEW YEAST CONSISTENT WITH CANDIDA SPECIES Performed at [facility]    Report Status 11/13/2015 FINAL  Final  Culture, Urine     Status: None   Collection Time: 11/10/15  1:45 PM  Result Value Ref Range Status   Specimen Description URINE, CATHETERIZED  Final   Special Requests Normal  Final   Culture NO GROWTH 1 DAY  Final   Report Status 11/11/2015 FINAL  Final    Radiology Reports Dg Chest 2 View  11/17/2015  CLINICAL DATA:  Followup respiratory failure EXAM: CHEST  2 VIEW COMPARISON:  Radiograph 11/11/2015 FINDINGS: Stable enlarged cardiac silhouette. Post extubation. Improvement in basilar atelectasis. There is volume loss in the RIGHT hemi thorax unchanged from prior. There is improvement in the airspace disease in the RIGHT lung compared to 11/11/2015. LEFT lung is clear. IMPRESSION: 1. Improvement in aeration to the RIGHT lung with persistent volume loss. 2. Post extubation without complication. Electronically Signed   By: Genevive Bi M.D.   On: 11/17/2015 10:19   Ct Chest Wo Contrast  11/08/2015  CLINICAL DATA:  Pneumonia.  Intubated. EXAM: CT CHEST WITHOUT CONTRAST TECHNIQUE: Multidetector CT imaging of the chest was performed following the standard protocol without IV contrast. COMPARISON:  Chest radiograph from one day prior. FINDINGS: Mediastinum/Nodes: Normal heart size. No pericardial fluid/thickening. Left anterior descending, left right coronary atherosclerosis. Right internal jugular central venous catheter terminates in right brachiocephalic vein. Great vessels are normal in course and caliber. Normal visualized thyroid. Normal esophagus. No axillary adenopathy. There are several mildly to moderately enlarged right paratracheal nodes, for example a 1.6 cm upper right paratracheal node  (series 2/image 4) and a 1.3 cm lower right paratracheal node (2/19). There is mild subcarinal lymphadenopathy, for example a 1.3 cm subcarinal node (2/30). No discrete hilar adenopathy on this noncontrast study. Lungs/Pleura: Endotracheal tube tip is 4.8 cm above the carina. No pneumothorax. No pleural effusion. There is dense consolidation throughout the right lobe with scattered air bronchograms and prominent volume loss in the right lung. No discrete central airway mass is seen in the right lung. Segmental consolidation in the medial basilar left lower lobe likely represents atelectasis given the associated volume loss. No significant pulmonary nodules or lung masses in the aerated portions of the left lung. Upper abdomen: Enteric tube terminates in the proximal body of the stomach. Status post cholecystectomy. Musculoskeletal: No aggressive appearing focal osseous lesions. Minimal degenerative changes in the thoracic spine. IMPRESSION: 1. Dense consolidation with scattered air bronchograms and prominent volume loss throughout the right lung. No discrete central airway mass. These findings likely represent a combination of atelectasis and pneumonia, although an underlying lung mass cannot be excluded. Continued chest imaging follow-up is warranted. 2. Segmental atelectasis in the medial basilar left lower lobe. 3. Nonspecific mild to moderate mediastinal lymphadenopathy. 4. Three-vessel coronary atherosclerosis. 5. Well-positioned endotracheal and enteric tubes and right internal jugular  line. Electronically Signed   By: Delbert Phenix M.D.   On: 11/08/2015 07:43   Dg Chest Port 1 View  11/11/2015  CLINICAL DATA:  Respiratory failure.  Hypoxia. EXAM: PORTABLE CHEST 1 VIEW COMPARISON:  Chest x-ray dated 11/08/2015. FINDINGS: Endotracheal tube remains well positioned with tip approximately 3 cm above the level of the carina. Enteric tube passes below the diaphragm. Right-sided internal jugular central line is  stable in position with tip in the expected region of the right brachiocephalic vein. There is improved aeration within the right lung. Left lung remains relatively clear, perhaps mild edema and/ or atelectasis at the left lung base. No pneumothorax. IMPRESSION: 1. Diffuse airspace opacities throughout the right lung, but with improved aeration compared to the previous chest x-ray of 11/08/2015 suggesting clearing pneumonia and/or edema. 2.  Mild edema and/or atelectasis at the left lung base. 3.  Tubes and lines stable in position. Electronically Signed   By: Bary Richard M.D.   On: 11/11/2015 12:13   Dg Chest Port 1 View  11/08/2015  CLINICAL DATA:  Ventilator dependent respiratory failure. Re-evaluate position of the right jugular central venous catheter which may have moved during intubation and orogastric tube placement. Followup right lung atelectasis/pneumonia. EXAM: PORTABLE CHEST 1 VIEW COMPARISON:  Portable chest x-ray yesterday. CT chest earlier same date. FINDINGS: Endotracheal tube tip in satisfactory position projecting approximately 3 cm above the carina. Right jugular central venous catheter tip projects over the junction of the innominate vein and SVC, unchanged since yesterday. OG tube courses below the diaphragm. Dense airspace consolidation throughout the right lung associated with air bronchograms and mild volume loss, not significantly changed since yesterday. Left lung remains essentially clear. IMPRESSION: 1. Support apparatus satisfactory. 2. Stable dense pneumonia and mild volume loss throughout the right lung since yesterday. 3. No new abnormalities. Electronically Signed   By: Hulan Saas M.D.   On: 11/08/2015 17:01   Dg Chest Port 1 View  11/07/2015  CLINICAL DATA:  Central venous catheter placement, ETT placement EXAM: PORTABLE CHEST 1 VIEW COMPARISON:  None. FINDINGS: Endotracheal tube with the tip 5.1 cm above the carina. Right jugular central venous catheter with the  tip projecting over the subclavian-jugular confluence. Near complete opacification of the right lung with air bronchograms most concerning for severe multilobar pneumonia. Left lung is clear. Normal heart mediastinum. The osseous structures are unremarkable. IMPRESSION: 1. Endotracheal tube with the tip 5.1 cm above the carina. 2. Right jugular central venous catheter with the tip projecting over the subclavian-jugular confluence. 3. Near-complete opacification of the right lung with air bronchograms most concerning for severe multilobar pneumonia. Electronically Signed   By: Elige Ko   On: 11/07/2015 21:35   Dg Abd Portable 1v  11/08/2015  CLINICAL DATA:  Orogastric tube placement EXAM: PORTABLE ABDOMEN - 1 VIEW COMPARISON:  11/07/2015 FINDINGS: Stable endotracheal tube. Right jugular venous catheter. Tip of the NG tube is in the proximal duodenum. There is consolidation of nearly entire right lung with some aeration at the right apex. Left basilar atelectasis. Normal heart size. Lung apices not included. IMPRESSION: NG tube tip is in the proximal duodenum. Stable extensive consolidation throughout the right lung. Electronically Signed   By: Jolaine Click M.D.   On: 11/08/2015 20:46    CBC  Recent Labs Lab 11/13/15 0500 11/14/15 0545 11/15/15 0440 11/16/15 0530 11/17/15 0730  WBC 20.6* 22.5* 20.6* 22.0* 21.9*  HGB 10.8* 10.7* 10.7* 11.5* 12.1*  HCT 36.4* 35.7* 36.6* 38.1* 40.0  PLT 387 408* 473* 591* 603*  MCV 93.3 94.2 94.8 94.3 94.3  MCH 27.7 28.2 27.7 28.5 28.5  MCHC 29.7* 30.0 29.2* 30.2 30.3  RDW 15.8* 15.9* 16.2* 16.2* 16.2*  LYMPHSABS 1.7 2.1 1.8 2.2 2.4  MONOABS 0.8 0.5 0.5 0.5 0.5  EOSABS 0.3 0.5 0.6 0.5 0.5  BASOSABS 0.0 0.0 0.0 0.0 0.1    Chemistries   Recent Labs Lab 11/13/15 0645  11/14/15 0545 11/14/15 1700 11/15/15 0440 11/15/15 1729 11/16/15 0530 11/17/15 0730  NA 148*  < > 150* 150* 150* 149* 151* 144  K 5.6*  < > 3.9 4.2 4.1 3.8 3.6 3.6  CL 112*  < >  112* 115* 114* 112* 113* 106  CO2 31  < > 30 30 29 27 31 29   GLUCOSE 123*  < > 129* 121* 119* 108* 100* 100*  BUN 47*  < > 48* 47* 47* 38* 33* 25*  CREATININE 1.22  < > 1.20 1.13 1.07 1.25* 1.29* 1.13  CALCIUM 7.9*  < > 8.2* 8.2* 7.9* 8.3* 8.4* 8.6*  MG 2.7*  --  2.5*  --  2.4  --  2.5* 2.2  < > = values in this interval not displayed. ------------------------------------------------------------------------------------------------------------------ estimated creatinine clearance is 101.7 mL/min (by C-G formula based on Cr of 1.13). ------------------------------------------------------------------------------------------------------------------ No results for input(s): HGBA1C in the last 72 hours. ------------------------------------------------------------------------------------------------------------------ No results for input(s): CHOL, HDL, LDLCALC, TRIG, CHOLHDL, LDLDIRECT in the last 72 hours. ------------------------------------------------------------------------------------------------------------------ No results for input(s): TSH, T4TOTAL, T3FREE, THYROIDAB in the last 72 hours.  Invalid input(s): FREET3 ------------------------------------------------------------------------------------------------------------------ No results for input(s): VITAMINB12, FOLATE, FERRITIN, TIBC, IRON, RETICCTPCT in the last 72 hours.  Coagulation profile No results for input(s): INR, PROTIME in the last 168 hours.  No results for input(s): DDIMER in the last 72 hours.  Cardiac Enzymes No results for input(s): CKMB, TROPONINI, MYOGLOBIN in the last 168 hours.  Invalid input(s): CK ------------------------------------------------------------------------------------------------------------------ Invalid input(s): POCBNP   Recent Labs  11/15/15 0735  GLUCAP 119*     RAI,RIPUDEEP M.D. [facility] 11/17/2015, 1:18 PM  Pager: [phone] Between 7am to 7pm - call Pager -  [phone]  After 7pm go to [email or web address] - password [email or web address]  Call night coverage person covering after 7pm

## 2015-11-17 NOTE — Progress Notes (Signed)
PULMONARY / CRITICAL CARE MEDICINE   Name: Kenneth Casey MRN: [id number] DOB: 09/08/1956    ADMISSION DATE:  11/07/2015 CONSULTATION DATE:   11/07/15  REFERR[facility] MD :  [facility]  CHIEF COMPLAINT:  Hypercarbic respiratory failure  INITIAL PRESENTATION: Kenneth Casey is a 59 y/o male with a h/o COPD who presented to [facility] with a 1 week history of cough, fevers, and worsening SOB found to be hypoxic to 86% on RA with an initial lactic acid of 3.6 and BNP of 703. He eventually required intubation for hypercarbic respiratory failure and was stated on Levophed for hypotension.    STUDIES:  CXR 11/28: Almost complete opacification of the R lung with air bronchograms.  CT Chest w/o 11/29:  Personally reviewed by me. Dense right lung opacity with air bronchograms consistent with consolidation. TTE 11/29:  Extremely limited. Mild LVH with dilation. EF 55-60%. Grade 1 diastolic dysfunction. Cannot r/o wall motion abnormality. RV poorly visualized. No aortic stenosis or regurg. No mitral stenosis or regurg. No effusion. Port CXR 12/2:  Improving right lung opacity. ETT in good position.   SIGNIFICANT EVENTS: 11/28 - [facility], int[facility]ated, started on Levo 11/28 - [facility]/29 - Self extubated & reintubated by JN 12/06 - Extubated  MICROBIOLOGY  Blood Ctx 12/1:  Negative  Blood Ctx [place] 11/28:  Negative Trach Aspirate 12/1:  Oral [place] Trach Aspirate 11/30:  Oral [place] Urine Ctx 12/1:  Negative Sputum Grm Stain [place] 11/28:  Many WBC Urine Strep Ag 11/28:  Positive Urine Legionella Ag 11/28:  Negative Respiratory Viral Panel PCR 11/28:  Negative HIV 11/29:  Negative  ANTIBIOTICS Zosyn 11/28>>  Vancomycin 11/28 - 12/4 Azithromycin 11/28 - 12/1 Tamiflu 11/28 - 12/1 Levaquin 11/28 - 11/28 Ceftriaxone 11/28 - 11/28   LINES/TUBES: R IJ CVL 11/28>> Foley 11/28>> OETT 11/28 >>11/29 (self-extubation)>>11/29  (reintubation by JN) R Radial Art Line 11/28 - 12/1  SUBJECTIVE: No events overnight. Reports he is breathing comfortably on [place]. Wants to eat.   REVIEW OF SYSTEMS:  Unable to obtain due to intubation.  VITAL SIGNS: Temp:  [98 F (36.7 C)-100.4 F (38 C)] 98 F (36.7 C) (12/08 0543) Pulse Rate:  [93-101] 93 (12/08 0543) Resp:  [11-44] 20 (12/08 0543) BP: (117-166)/(61-78) 129/67 mmHg (12/08 0543) SpO2:  [89 %-97 %] 97 % (12/08 0543) Weight:  [139.7 kg (307 lb 15.7 oz)] 139.7 kg (307 lb 15.7 oz) (12/08 0500) HEMODYNAMICS:   VENTILATOR SETTINGS:   INTAKE / OUTPUT:  Intake/Output Summary (Last 24 hours) at 11/17/15 1037 Last data filed at 11/17/15 [id number]  Gross per 24 hour  Intake    600 ml  Output   3781 ml  Net  -3181 ml    PHYSICAL EXAMINATION: General: morbidly obese man sitting up in bed, NAD HEENT:  [place]/AT, EOMI, sclera anicteric, mucus membranes moist  Cardiovascular:  RRR, no m/g/r. Unable to appreciate JVD given body habitus. Pulmonary:  CTA bilaterally, breaths non-labored on 4 L oxygen via [place] Abdomen: BS+, soft, obese, non-tender  Ext: 2+ pitting edema in lower extremities bilaterally  Neurological: alert and oriented x 3. Follows commands. Moving all extremities.   LABS:  CBC  Recent Labs Lab 11/15/15 0440 11/16/15 0530 11/17/15 0730  WBC 20.6* 22.0* 21.9*  HGB 10.7* 11.5* 12.1*  HCT 36.6* 38.1* 40.0  PLT 473* 591* 603*   Coag's No results for input(s): APTT, INR in the last 168 hours. BMET  Recent Labs Lab 11/15/15 1729 11/16/15  0530 11/17/15 0730  NA 149* 151* 144  K 3.8 3.6 3.6  CL 112* 113* 106  CO2 BUN 38* 33* 25*  CREATININE 1.25* 1.29* 1.13  GLUCOSE 108* 100* 100*   Electrolytes  Recent Labs Lab 11/15/15 0440 11/15/15 1729 11/16/15 0530 11/17/15 0730  CALCIUM 7.9* 8.3* 8.4* 8.6*  MG 2.4  --  2.5* 2.2  PHOS 4.3  --  4.2 4.8*   Sepsis Markers No results for input(s): LATICACIDVEN, PROCALCITON, O2SATVEN in the last  168 hours. ABG No results for input(s): PHART, PCO2ART, PO2ART in the last 168 hours. Liver Enzymes  Recent Labs Lab 11/15/15 0440 11/16/15 0530 11/17/15 0730  ALBUMIN 1.7* 1.8* 2.0*   Cardiac Enzymes No results for input(s): TROPONINI, PROBNP in the last 168 hours. Glucose  Recent Labs Lab 11/11/15 1108 11/11/15 1527 11/11/15 1932 11/13/15 0041 11/14/15 0730 11/15/15 0735  GLUCAP 128* 132* 122* 124* 120* 119*    Imaging Dg Chest 2 View  11/17/2015  CLINICAL DATA:  Followup respiratory failure EXAM: CHEST  2 VIEW COMPARISON:  Radiograph 11/11/2015 FINDINGS: Stable enlarged cardiac silhouette. Post extubation. Improvement in basilar atelectasis. There is volume loss in the RIGHT hemi thorax unchanged from prior. There is improvement in the airspace disease in the RIGHT lung compared to 11/11/2015. LEFT lung is clear. IMPRESSION: 1. Improvement in aeration to the RIGHT lung with persistent volume loss. 2. Post extubation without complication. Electronically Signed   By: Genevive Bi M.D.   On: 11/17/2015 10:19     ASSESSMENT / PLAN:  PULMONARY A: Acute Hypoxic Respiratory Failure - Secondary to Severe CAP. Improved Acute Hypercarbic Respiratory Failure  Severe CAP - Improving clinically. CXR 12/8 with improvement as well COPD w presumed AE Suspicion for OSA based on body habitus and clinical hx.  P:   Extubated 12/6 Changed to symbicort 12/8, consider addition of Spiriva as well. Likely OK to wait for his hosp f/u visit to determine this Convert Duoneb to q6h prn Taper pred to 0 over about 10 days.  Chest PT  Likely needs an outpt PSG  Will need outpt pulm followup   INFECTIOUS A:   Septic Shock - Secondary to Severe CAP. Shock has resolved.  Severe CAP - Strep Pneumo Ag positive.  P:    Completed pip/tazo x 10 days, started on augmentin 12/8 for ? Duration. Likely have treated adequately at this point Plan to re-culture for any fever   FAMILY  -  Updates: Wife & daughter updated at bedside by JN on 12/6.  TODAY'S SUMMARY: 59 year old morbidly obese man admitted with severe community acquired pneumonia and acute hypoxic respiratory failure.  Improving. Has COPD, started on scheduled symbicort. Suspicious for OSA. Should have PSG and PFT as an outpt once this illness resolves.   Please call if we can assist further.   Levy Pupa, MD, PhD 11/17/2015, 10:46 AM [facility] [phone] or if no answer [phone]

## 2015-11-18 LAB — RENAL FUNCTION PANEL
ALBUMIN: 2.1 g/dL — AB (ref 3.5–5.0)
Anion gap: 9 (ref 5–15)
BUN: 23 mg/dL — AB (ref 6–20)
CALCIUM: 8.7 mg/dL — AB (ref 8.9–10.3)
CO2: 31 mmol/L (ref 22–32)
CREATININE: 1.1 mg/dL (ref 0.61–1.24)
Chloride: 103 mmol/L (ref 101–111)
GFR calc Af Amer: >60 mL/min (ref >60)
Glucose, Bld: 98 mg/dL (ref 65–99)
PHOSPHORUS: 5 mg/dL — AB (ref 2.5–4.6)
Potassium: 4.1 mmol/L (ref 3.5–5.1)
SODIUM: 143 mmol/L (ref 135–145)

## 2015-11-18 LAB — CBC WITH DIFFERENTIAL/PLATELET
BASOS ABS: 0 10*3/uL (ref 0.0–0.1)
BASOS PCT: 0 %
EOS PCT: 0 %
Eosinophils Absolute: 0.1 10*3/uL (ref 0.0–0.7)
HEMATOCRIT: 39.6 % (ref 39.0–52.0)
Hemoglobin: 11.8 g/dL — ABNORMAL LOW (ref 13.0–17.0)
LYMPHS ABS: 2.2 10*3/uL (ref 0.7–4.0)
LYMPHS PCT: 11 %
MCH: 28.4 pg (ref 26.0–34.0)
MCHC: 29.8 g/dL — ABNORMAL LOW (ref 30.0–36.0)
MCV: 95.2 fL (ref 78.0–100.0)
MONOS PCT: 3 %
Monocytes Absolute: 0.7 10*3/uL (ref 0.1–1.0)
Neutro Abs: 17.7 10*3/uL — ABNORMAL HIGH (ref 1.7–7.7)
Neutrophils Relative %: 86 %
PLATELETS: 654 10*3/uL — AB (ref 150–400)
RBC: 4.16 MIL/uL — ABNORMAL LOW (ref 4.22–5.81)
RDW: 15.7 % — AB (ref 11.5–15.5)
WBC: 20.6 10*3/uL — AB (ref 4.0–10.5)

## 2015-11-18 LAB — GLUCOSE, CAPILLARY: Glucose-Capillary: 97 mg/dL (ref 65–99)

## 2015-11-18 LAB — MAGNESIUM: MAGNESIUM: 2.3 mg/dL (ref 1.7–2.4)

## 2015-11-18 MED ORDER — FUROSEMIDE 40 MG PO TABS
40.0000 mg | ORAL_TABLET | Freq: Every day | ORAL | Status: DC
  Administered 2015-11-18: 40 mg via ORAL
  Filled 2015-11-18: qty 1

## 2015-11-18 MED ORDER — FUROSEMIDE 40 MG PO TABS: 40.0000 mg | ORAL_TABLET | Freq: Every day | ORAL | Status: DC

## 2015-11-18 MED ORDER — ALBUTEROL SULFATE HFA 108 (90 BASE) MCG/ACT IN AERS: 2.0000 | INHALATION_SPRAY | Freq: Four times a day (QID) | RESPIRATORY_TRACT | Status: DC | PRN

## 2015-11-18 MED ORDER — BUDESONIDE-FORMOTEROL FUMARATE 80-4.5 MCG/ACT IN AERO: 2.0000 | INHALATION_SPRAY | Freq: Two times a day (BID) | RESPIRATORY_TRACT | Status: DC

## 2015-11-18 MED ORDER — PREDNISONE 10 MG PO TABS: ORAL_TABLET | ORAL | Status: DC

## 2015-11-18 MED ORDER — AMOXICILLIN-POT CLAVULANATE 875-125 MG PO TABS: 1.0000 | ORAL_TABLET | Freq: Two times a day (BID) | ORAL | Status: DC

## 2015-11-18 MED ORDER — BENZONATATE 100 MG PO CAPS: 100.0000 mg | ORAL_CAPSULE | Freq: Three times a day (TID) | ORAL | Status: DC | PRN

## 2015-11-18 NOTE — Care Management Note (Signed)
Case Management Note  Patient Details  Name: Kenneth Casey MRN: [id number] Date of Birth: 07/09/1956  Subjective/Objective:                  Date-11-18-15 Initial Assessment Spoke with patient at the bedside along with daughter and wife  Introduced self as case Production designer, theatre/television/filmmanager and explained role in discharge planning and how to be reached.  [place] Verified patient anticipates to go home with spouse at time of discharge and will have  part-time supervision by family.  Patient has no DME. Expressed need for oxygen.  Patient confirmed  needing help with their medication.  Patient drives or is driven by wife to MD appointments.  Verified patient has no PCP. [facility] and med assitance and orange card/ medicaid applications. [facility]. CM explained to patient that they may use the on site pharmacy to fill prescriptions given to them at discharge. Patient aware that the [facility] will not fill narcotics or pain medications prior to the patient being seen by one of their physicians.  Patient aware that they must be seen as a patient prior to the pharmacy filling the prescriptions a second time.  Patient states they currently receive HH services through no one.  Patient unisured and informed that he does not have a qualifying diagnosis for [facility] and cost would be ~ 150 per visit. Patient declined HH PT.  Plan: CM will continue to follow for discharge planning and HH resources.   [name] RN BSN CM [phone]   Action/Plan:   Expected Discharge Date:                  Expected Discharge Plan:  Home/Self Care  In-House Referral:     Discharge planning Services  CM Consult, [facility]igent [facility]  Post Acute Care Choice:  Durable Medical Equipment Choice offered to:     DME Arranged:  Oxygen DME Agency:  [facility]  HH Arranged:    [facility] Agency:     Status of Service:  Completed, signed off  Medicare Important Message Given:    Date Medicare IM Given:    Medicare IM give by:    Date Additional Medicare IM Given:    Additional Medicare Important Message give by:     If discussed at Long Length of Stay Meetings, dates discussed:    Additional Comments:  Lawerance SabalDebbie Merlene Dante, RN 11/18/2015, 12:36 PM

## 2015-11-18 NOTE — Progress Notes (Signed)
SATURATION QUALIFICATIONS: (This note is used to comply with regulatory documentation for home oxygen)  Patient Saturations on Room Air at Rest = 91%  Patient Saturations on Room Air while Ambulating = 85%  Patient Saturations on 3 Liters of oxygen while Ambulating = 88%  Please briefly explain why patient needs home oxygen: Pt O2 level dropped while walking, without using oxygen. May need oxygen at home until completely well.

## 2015-11-18 NOTE — Progress Notes (Signed)
Occupational Therapy Treatment Patient Details Name: Kenneth Casey MRN: [id number] DOB: 11/05/1956 Today's Date: 11/18/2015    History of present illness 59 y/o male with a h/o COPD who [facility] with a 1 week history of cough, fevers, and worsening SOB found to be hypoxic to 86% on RA with an initial lactic acid of 3.6 and BNP of 703. He eventually required intubation for hypercarbic respiratory failure and was stated on Levophed for hypotension. Self extubated 11/29 and reintubated. Extubated 12/06.    OT comments  Pt is making great progress towards goals.  Pt progressed for +2 assist with mobility to min guard with RW.  RN requested pt ambulate to assess oxygen levels in preparation to d/c home.  Pt ambulated 40 feet with RW at min guard level with therapist managing O2 tank.  Pt O2 dropping to 89% on 4L O2 and HR elevated to 107.  Attempted ambulation with 3L O2 pt ambulating 20 feet with O2 dropping to 88%.  Required 1 min seated rest break both times to turn to 93%.  Educated on AE and DME to increase safety and participation in self-care tasks.  Provided pt with handout for energy conservation strategies.  Recommend 3 in 1 and AE, as well as home health to continue to address ADL and IADLs.  Follow Up Recommendations  Supervision/Assistance - 24 hour;Home health OT    Equipment Recommendations  3 in 1 bedside comode    Recommendations for Other Services      Precautions / Restrictions Precautions Precautions: Fall Precaution Comments: watch O2       Mobility Bed Mobility               General bed mobility comments: Pt already seated in recliner upon arrival  Transfers Overall transfer level: Needs assistance Equipment used: Rolling walker (2 wheeled) Transfers: Sit to/from [employer]Stand;Stand Pivot Transfers Sit to Stand: Min guard Stand pivot transfers: Min guard       General transfer comment: Educated on hand placement for sit > stand,  to not pull up on RW.        ADL Overall ADL's : Needs assistance/impaired             Lower Body Bathing: Maximal assistance;Sit to/from stand Lower Body Bathing Details (indicate cue type and reason): educated on use of AE     Lower Body Dressing: Maximal assistance;Sit to/from stand Lower Body Dressing Details (indicate cue type and reason): educated on reacher to assist with donning pants, shoes.  Also on modified circle sitting to be more successful with reaching feet  Toilet Transfer: Min guard;Grab bars;Comfort height toilet;RW   Toileting- Clothing Manipulation and Hygiene: Total assistance Toileting - Clothing Manipulation Details (indicate cue type and reason): educated on use of toilet aid to assist with hygiene     Functional mobility during ADLs: Min guard;Rolling walker General ADL Comments: Pt unable to reach feet at this time, educated on modified circle sit or use of step stool to reach feet also educated on use of reacher and long handled sponge with bathing and dressing.  SOB with increased RR with minimal activity     Cognition   Behavior During Therapy: WFL for tasks assessed/performed Overall Cognitive Status: Within Functional Limits for tasks assessed                                    Pertinent Vitals/  Pain       Pain Assessment: No/denies pain         Frequency Min 2X/week     Progress Toward Goals  OT Goals(current goals can now be found in the care plan section)  Progress towards OT goals: Progressing toward goals  Acute Rehab OT Goals Patient Stated Goal: to go home OT Goal Formulation: With patient  Plan Discharge plan remains appropriate       End of Session Equipment Utilized During Treatment: Oxygen (4L with one trial on 3L)   Activity Tolerance Patient tolerated treatment well   Patient Left in chair;with call bell/phone within reach;with chair alarm set   Nurse Communication Mobility status        Time:  1018-1050 OT Time Calculation (min): 32 min  Charges: OT General Charges $OT Visit: 1 Procedure OT Treatments $Self Care/Home Management : 23-37 mins  Rosalio Loud, [phone] 11/18/2015, 11:04 AM

## 2015-11-18 NOTE — Discharge Summary (Signed)
Physician Discharge Summary   Patient ID: Kenneth Casey MRN: [id number] DOB/AGE: 57/03/1956 57 y.o.  Admit date: 11/07/2015 Discharge date: 11/18/2015  Primary Care Physician:  No PCP Per Patient  Discharge Diagnoses:   Acute hypoxic and hypercarbic respiratory failure Severe community-acquired pneumonia Septic shock secondary to severe community-acquired pneumonia Acute kidney injury Acute encephalopathy Generalized debility  Consults:  Pulmonology  Recommendations for Outpatient Follow-up:  1. Home health PT, OT, home health aide, RN were arranged 2. Patient qualified for 3 L home O2 with ambulation 3. Please repeat CBC/BMET at next visit 4. Please obtain chest x-ray in 2-3 weeks to ensure complete resolution of pneumonia    TESTS THAT NEED FOLLOW-UP CBC, BMET   DIET: Heart healthy diet    Allergies:  No Known Allergies   DISCHARGE MEDICATIONS: Current Discharge Medication List    START taking these medications   Details  albuterol (PROVENTIL HFA;VENTOLIN HFA) 108 (90 BASE) MCG/ACT inhaler Inhale 2 puffs into the lungs every 6 (six) hours as needed for wheezing or shortness of breath. Qty: 1 Inhaler, Refills: 2    amoxicillin-clavulanate (AUGMENTIN) 875-125 MG tablet Take 1 tablet by mouth 2 (two) times daily. X 3 days Qty: 6 tablet, Refills: 0    benzonatate (TESSALON) 100 MG capsule Take 1 capsule (100 mg total) by mouth 3 (three) times daily as needed for cough. Qty: 45 capsule, Refills: 0    budesonide-formoterol (SYMBICORT) 80-4.5 MCG/ACT inhaler Inhale 2 puffs into the lungs 2 (two) times daily. Qty: 1 Inhaler, Refills: 12    furosemide (LASIX) 40 MG tablet Take 1 tablet (40 mg total) by mouth daily. Qty: 30 tablet, Refills: 1    predniSONE (DELTASONE) 10 MG tablet Prednisone dosing: Take  Prednisone 40mg  (4 tabs) x 2 days, then taper to 30mg  (3 tabs) x 3 days, then 20mg  (2 tabs) x 3days, then 10mg  (1 tab) x 3days, then OFF. Qty: 26 tablet, Refills:  0      CONTINUE these medications which have NOT CHANGED   Details  acetaminophen (TYLENOL) 500 MG tablet Take 1,000 mg by mouth every 6 (six) hours as needed for moderate pain.    albuterol (PROVENTIL) (2.5 MG/3ML) 0.083% nebulizer solution Take 2.5 mg by nebulization every 6 (six) hours as needed for wheezing or shortness of breath.    fluticasone (FLONASE) 50 MCG/ACT nasal spray Place 2 sprays into both nostrils daily as needed for allergies or rhinitis.      STOP taking these medications     ibuprofen (ADVIL,MOTRIN) 200 MG tablet          Brief H and P: For complete details please refer to admission H and P, but in brief Patient was admitted to ICU by critical care service on 11/28, per admit note Kenneth Casey is a 59 y/o male with a h/o COPD who presented [facility] with a 1 week history of cough, fevers, and worsening SOB found to be hypoxic to 86% on RA with an initial lactic acid of 3.6 and BNP of 703. He eventually required intubation for hypercarbic respiratory failure and was stated on Levophed for hypotension.  Patient was transferred to the floor on 12/ 7/16  Hospital Course:  Acute hypoxic and hypercarbiac respiratory failure, septic shock with severe CAP (community acquired pneumonia) - Patient was admitted to intensive care unit, was [facility] 11[facility], patient had been intub[facility] in [facility] and was started on vasopressors, [facility] on 11[facility]28. Eventually patient was extubated on  12/6 and has been doing well. - Continue Symbicort, DuoNeb's, prednisone with taper upon discharge - Urine strep antigen was positive. Patient received 10/14 days of Zosyn, Augmentin was added and continued for 3 more days to finish off the course. - Chest x-ray 12/8 showed improvement in aeration to the right lung with persistent volume loss - 2-D echo had shown EF of 55-60% with grade 1 diastolic dysfunction (still has significant peripheral edema),  patient received IV Lasix during hospitalization, placed on 40 mg of oral Lasix daily at discharge.  - Home O2 evaluation was done prior to discharge, qualifies for 3 L with ambulation.   Septic shock (HCC) secondary to severe CAP -Currently resolved, blood cultures remained negative, patient had required vasopressors. Urine strep antigen was positive - Leukocytosis is improving, currently patient is on prednisone. Please obtain CBC at the time of her follow-up appointment to re-evaluate leukocytosis.   Acute kidney injury [facility]): Patient presented with creatinine function of 2.0, BUN 45 - Improved with IV fluids and management of sepsis, creatinine 1.1 at the time of discharge.   Encephalopathy acute - Resolved likely due to septic shock and acute hypercarbic respiratory failure  Generalized debility - PTOT evaluation recommended home health physical therapy   Day of Discharge BP 122/49 mmHg  Pulse 93  Temp(Src) 98 F (36.7 C) (Oral)  Resp 18  Ht 5\' 10"  (1.778 m)  Wt 140.1 kg (308 lb 13.8 oz)  BMI 44.32 kg/m2  SpO2 99%  Physical Exam: General: Alert and awake oriented x3 not in any acute distress. HEENT: anicteric sclera, pupils reactive to light and accommodation CVS: S1-S2 clear no murmur rubs or gallops Chest: clear to auscultation bilaterally, no wheezing rales or rhonchi Abdomen: soft nontender, nondistended, normal bowel sounds Extremities: no cyanosis, clubbing, 1+ edema noted bilaterally Neuro: Cranial nerves II-XII intact, no focal neurological deficits   The results of significant diagnostics from this hospitalization (including imaging, microbiology, ancillary and laboratory) are listed below for reference.    LAB RESULTS: Basic Metabolic Panel:  Recent Labs Lab 11/17/15 0730 11/18/15 0555  NA 144 143  K 3.6 4.1  CL 106 103  CO2 29 31  GLUCOSE 100* 98  BUN 25* 23*  CREATININE 1.13 1.10  CALCIUM 8.6* 8.7*  MG 2.2 2.3  PHOS 4.8* 5.0*   Liver  Function Tests:  Recent Labs Lab 11/17/15 0730 11/18/15 0555  ALBUMIN 2.0* 2.1*   No results for input(s): LIPASE, AMYLASE in the last 168 hours. No results for input(s): AMMONIA in the last 168 hours. CBC:  Recent Labs Lab 11/17/15 0730 11/18/15 0555  WBC 21.9* 20.6*  NEUTROABS 18.4* 17.7*  HGB 12.1* 11.8*  HCT 40.0 39.6  MCV 94.3 95.2  PLT 603* 654*   Cardiac Enzymes: No results for input(s): CKTOTAL, CKMB, CKMBINDEX, TROPONINI in the last 168 hours. BNP: Invalid input(s): POCBNP CBG:  Recent Labs Lab 11/15/15 0735 11/18/15 0802  GLUCAP 119* 97    Significant Diagnostic Studies:  Ct Chest Wo Contrast  11/08/2015  CLINICAL DATA:  Pneumonia.  Intubated. EXAM: CT CHEST WITHOUT CONTRAST TECHNIQUE: Multidetector CT imaging of the chest was performed following the standard protocol without IV contrast. COMPARISON:  Chest radiograph from one day prior. FINDINGS: Mediastinum/Nodes: Normal heart size. No pericardial fluid/thickening. Left anterior descending, left right coronary atherosclerosis. Right internal jugular central venous catheter terminates in right brachiocephalic vein. Great vessels are normal in course and caliber. Normal visualized thyroid. Normal esophagus. No axillary adenopathy. There are several mildly to  moderately enlarged right paratracheal nodes, for example a 1.6 cm upper right paratracheal node (series 2/image 4) and a 1.3 cm lower right paratracheal node (2/19). There is mild subcarinal lymphadenopathy, for example a 1.3 cm subcarinal node (2/30). No discrete hilar adenopathy on this noncontrast study. Lungs/Pleura: Endotracheal tube tip is 4.8 cm above the carina. No pneumothorax. No pleural effusion. There is dense consolidation throughout the right lobe with scattered air bronchograms and prominent volume loss in the right lung. No discrete central airway mass is seen in the right lung. Segmental consolidation in the medial basilar left lower lobe likely  represents atelectasis given the associated volume loss. No significant pulmonary nodules or lung masses in the aerated portions of the left lung. Upper abdomen: Enteric tube terminates in the proximal body of the stomach. Status post cholecystectomy. Musculoskeletal: No aggressive appearing focal osseous lesions. Minimal degenerative changes in the thoracic spine. IMPRESSION: 1. Dense consolidation with scattered air bronchograms and prominent volume loss throughout the right lung. No discrete central airway mass. These findings likely represent a combination of atelectasis and pneumonia, although an underlying lung mass cannot be excluded. Continued chest imaging follow-up is warranted. 2. Segmental atelectasis in the medial basilar left lower lobe. 3. Nonspecific mild to moderate mediastinal lymphadenopathy. 4. Three-vessel coronary atherosclerosis. 5. Well-positioned endotracheal and enteric tubes and right internal jugular line. Electronically Signed   By: Delbert Phenix M.D.   On: 11/08/2015 07:43   Dg Chest Port 1 View  11/08/2015  CLINICAL DATA:  Ventilator dependent respiratory failure. Re-evaluate position of the right jugular central venous catheter which may have moved during intubation and orogastric tube placement. Followup right lung atelectasis/pneumonia. EXAM: PORTABLE CHEST 1 VIEW COMPARISON:  Portable chest x-ray yesterday. CT chest earlier same date. FINDINGS: Endotracheal tube tip in satisfactory position projecting approximately 3 cm above the carina. Right jugular central venous catheter tip projects over the junction of the innominate vein and SVC, unchanged since yesterday. OG tube courses below the diaphragm. Dense airspace consolidation throughout the right lung associated with air bronchograms and mild volume loss, not significantly changed since yesterday. Left lung remains essentially clear. IMPRESSION: 1. Support apparatus satisfactory. 2. Stable dense pneumonia and mild volume loss  throughout the right lung since yesterday. 3. No new abnormalities. Electronically Signed   By: Hulan Saas M.D.   On: 11/08/2015 17:01   Dg Chest Port 1 View  11/07/2015  CLINICAL DATA:  Central venous catheter placement, ETT placement EXAM: PORTABLE CHEST 1 VIEW COMPARISON:  None. FINDINGS: Endotracheal tube with the tip 5.1 cm above the carina. Right jugular central venous catheter with the tip projecting over the subclavian-jugular confluence. Near complete opacification of the right lung with air bronchograms most concerning for severe multilobar pneumonia. Left lung is clear. Normal heart mediastinum. The osseous structures are unremarkable. IMPRESSION: 1. Endotracheal tube with the tip 5.1 cm above the carina. 2. Right jugular central venous catheter with the tip projecting over the subclavian-jugular confluence. 3. Near-complete opacification of the right lung with air bronchograms most concerning for severe multilobar pneumonia. Electronically Signed   By: Elige Ko   On: 11/07/2015 21:35   Dg Abd Portable 1v  11/08/2015  CLINICAL DATA:  Orogastric tube placement EXAM: PORTABLE ABDOMEN - 1 VIEW COMPARISON:  11/07/2015 FINDINGS: Stable endotracheal tube. Right jugular venous catheter. Tip of the NG tube is in the proximal duodenum. There is consolidation of nearly entire right lung with some aeration at the right apex. Left basilar atelectasis. Normal heart  size. Lung apices not included. IMPRESSION: NG tube tip is in the proximal duodenum. Stable extensive consolidation throughout the right lung. Electronically Signed   By: Jolaine Click M.D.   On: 11/08/2015 20:46    2D ECHO: Study Conclusions  - Left ventricle: The cavity size was mildly dilated. Wall thickness was increased in a pattern of mild LVH. Systolic function was normal. The estimated ejection fraction was in the range of 55% to 60%. Doppler parameters are consistent with abnormal left ventricular relaxation  (grade 1 diastolic dysfunction).  Disposition and Follow-up: Discharge Instructions    Diet - low sodium heart healthy    Complete by:  As directed      Increase activity slowly    Complete by:  As directed             DISPOSITION: home with home health   DISCHARGE FOLLOW-UP Follow-up Information    Follow up with Sandrea Hughs, MD On 12/02/2015.   Specialty:  Pulmonary Disease   Why:  at 10:30AM   Contact information:   [address] [place] [address] [phone]       Follow up with [facility] On 12/07/2015.   Specialty:  Internal Medicine   Why:  9:15 for hospital follow up    Contact information:   [address] [place] [address] [phone]       Time spent on Discharge: 35 minutes  Signed:   Candi Profit M.D. [facility] 11/18/2015, 12:18 PM Pager: [phone]

## 2015-11-18 NOTE — Progress Notes (Signed)
Pt given discharge instructions, prescriptions, and care notes. Pt verbalized understanding AEB no further questions or concerns at this time. IV was discontinued, no redness, pain, or swelling noted at this time. Pt left the floor via wheelchair with staff in stable condition. 

## 2015-11-18 NOTE — Progress Notes (Signed)
PT Cancellation Note  Patient Details Name: Kenneth Casey MRN: [id number] DOB: 01/09/1956   Cancelled Treatment:    Reason Eval/Treat Not Completed: Patient declined, no reason specified;Other (comment) (receiving a bath just prior to going home).  DC today   Kenneth Casey, Kenneth Casey 11/18/2015, 2:42 PM   Kenneth Casey, PT MS Acute Rehab Dept. Number: [facility] [id number] and [facility] [id number]

## 2015-12-01 ENCOUNTER — Ambulatory Visit: Payer: Self-pay

## 2015-12-02 ENCOUNTER — Inpatient Hospital Stay: Payer: Self-pay | Admitting: Internal Medicine

## 2015-12-07 ENCOUNTER — Ambulatory Visit: Payer: Self-pay | Admitting: Family Medicine

## 2015-12-08 ENCOUNTER — Encounter: Payer: Self-pay | Admitting: Family Medicine

## 2015-12-08 ENCOUNTER — Ambulatory Visit (INDEPENDENT_AMBULATORY_CARE_PROVIDER_SITE_OTHER): Payer: Self-pay | Admitting: Family Medicine

## 2015-12-08 VITALS — BP 142/82 | HR 106 | Temp 97.7°F | Resp 18 | Ht 69.0 in | Wt 312.0 lb

## 2015-12-08 DIAGNOSIS — R030 Elevated blood-pressure reading, without diagnosis of hypertension: Secondary | ICD-10-CM

## 2015-12-08 DIAGNOSIS — D72829 Elevated white blood cell count, unspecified: Secondary | ICD-10-CM

## 2015-12-08 DIAGNOSIS — IMO0001 Reserved for inherently not codable concepts without codable children: Secondary | ICD-10-CM

## 2015-12-08 DIAGNOSIS — Z9981 Dependence on supplemental oxygen: Secondary | ICD-10-CM

## 2015-12-08 DIAGNOSIS — I5189 Other ill-defined heart diseases: Secondary | ICD-10-CM | POA: Insufficient documentation

## 2015-12-08 DIAGNOSIS — G4700 Insomnia, unspecified: Secondary | ICD-10-CM

## 2015-12-08 DIAGNOSIS — I519 Heart disease, unspecified: Secondary | ICD-10-CM

## 2015-12-08 DIAGNOSIS — R000 Tachycardia, unspecified: Secondary | ICD-10-CM

## 2015-12-08 DIAGNOSIS — J449 Chronic obstructive pulmonary disease, unspecified: Secondary | ICD-10-CM

## 2015-12-08 DIAGNOSIS — R600 Localized edema: Secondary | ICD-10-CM

## 2015-12-08 LAB — CBC WITH DIFFERENTIAL/PLATELET
BASOS ABS: 0 10*3/uL (ref 0.0–0.1)
BASOS PCT: 0 % (ref 0–1)
EOS ABS: 0.2 10*3/uL (ref 0.0–0.7)
Eosinophils Relative: 2 % (ref 0–5)
HCT: 42.7 % (ref 39.0–52.0)
Hemoglobin: 13.7 g/dL (ref 13.0–17.0)
Lymphocytes Relative: 19 % (ref 12–46)
Lymphs Abs: 2 10*3/uL (ref 0.7–4.0)
MCH: 29 pg (ref 26.0–34.0)
MCHC: 32.1 g/dL (ref 30.0–36.0)
MCV: 90.3 fL (ref 78.0–100.0)
MPV: 9.5 fL (ref 8.6–12.4)
Monocytes Absolute: 0.6 10*3/uL (ref 0.1–1.0)
Monocytes Relative: 6 % (ref 3–12)
NEUTROS PCT: 73 % (ref 43–77)
Neutro Abs: 7.7 10*3/uL (ref 1.7–7.7)
PLATELETS: 258 10*3/uL (ref 150–400)
RBC: 4.73 MIL/uL (ref 4.22–5.81)
RDW: 16 % — ABNORMAL HIGH (ref 11.5–15.5)
WBC: 10.6 10*3/uL — ABNORMAL HIGH (ref 4.0–10.5)

## 2015-12-08 LAB — POCT URINALYSIS DIP (DEVICE)
BILIRUBIN URINE: NEGATIVE
Glucose, UA: NEGATIVE mg/dL
HGB URINE DIPSTICK: NEGATIVE
Ketones, ur: NEGATIVE mg/dL
Leukocytes, UA: NEGATIVE
NITRITE: NEGATIVE
PH: 6.5 (ref 5.0–8.0)
PROTEIN: NEGATIVE mg/dL
Specific Gravity, Urine: 1.02 (ref 1.005–1.030)
UROBILINOGEN UA: 0.2 mg/dL (ref 0.0–1.0)

## 2015-12-08 LAB — COMPLETE METABOLIC PANEL WITH GFR
ALT: 22 U/L (ref 9–46)
AST: 16 U/L (ref 10–35)
Albumin: 3.7 g/dL (ref 3.6–5.1)
Alkaline Phosphatase: 65 U/L (ref 40–115)
BILIRUBIN TOTAL: 0.4 mg/dL (ref 0.2–1.2)
BUN: 15 mg/dL (ref 7–25)
CHLORIDE: 96 mmol/L — AB (ref 98–110)
CO2: 29 mmol/L (ref 20–31)
Calcium: 9.2 mg/dL (ref 8.6–10.3)
Creat: 0.89 mg/dL (ref 0.70–1.33)
GFR, Est African American: >89 mL/min (ref >=60)
GLUCOSE: 80 mg/dL (ref 65–99)
Potassium: 4.2 mmol/L (ref 3.5–5.3)
SODIUM: 138 mmol/L (ref 135–146)
TOTAL PROTEIN: 7 g/dL (ref 6.1–8.1)

## 2015-12-08 LAB — HEMOGLOBIN A1C
Hgb A1c MFr Bld: 6 % — ABNORMAL HIGH (ref <5.7)
MEAN PLASMA GLUCOSE: 126 mg/dL — AB (ref <117)

## 2015-12-08 LAB — TSH: TSH: 2.564 u[IU]/mL (ref 0.350–4.500)

## 2015-12-08 MED ORDER — FUROSEMIDE 40 MG PO TABS: 40.0000 mg | ORAL_TABLET | Freq: Every day | ORAL | Status: DC

## 2015-12-08 MED ORDER — CARVEDILOL 6.25 MG PO TABS: 6.2500 mg | ORAL_TABLET | Freq: Two times a day (BID) | ORAL | Status: DC

## 2015-12-08 MED ORDER — AMITRIPTYLINE HCL 10 MG PO TABS: 10.0000 mg | ORAL_TABLET | Freq: Every evening | ORAL | Status: DC | PRN

## 2015-12-08 NOTE — Patient Instructions (Signed)
Start Low fat, low salt diet divided over 6 small meals.   Increase daily activity level  Fat and Cholesterol Restricted Diet High levels of fat and cholesterol in your blood may lead to various health problems, such as diseases of the heart, blood vessels, gallbladder, liver, and pancreas. Fats are concentrated sources of energy that come in various forms. Certain types of fat, including saturated fat, may be harmful in excess. Cholesterol is a substance needed by your body in small amounts. Your body makes all the cholesterol it needs. Excess cholesterol comes from the food you eat. When you have high levels of cholesterol and saturated fat in your blood, health problems can develop because the excess fat and cholesterol will gather along the walls of your blood vessels, causing them to narrow. Choosing the right foods will help you control your intake of fat and cholesterol. This will help keep the levels of these substances in your blood within normal limits and reduce your risk of disease. WHAT IS MY PLAN? Your health care provider recommends that you:  Get no more than __________ % of the total calories in your daily diet from fat.  Limit your intake of saturated fat to less than ______% of your total calories each day.  Limit the amount of cholesterol in your diet to less than _________mg per day. WHAT TYPES OF FAT SHOULD I CHOOSE?  Choose healthy fats more often. Choose monounsaturated and polyunsaturated fats, such as olive and canola oil, flaxseeds, walnuts, almonds, and seeds.  Eat more omega-3 fats. Good choices include salmon, mackerel, sardines, tuna, flaxseed oil, and ground flaxseeds. Aim to eat fish at least two times a week.  Limit saturated fats. Saturated fats are primarily found in animal products, such as meats, butter, and cream. Plant sources of saturated fats include palm oil, palm kernel oil, and coconut oil.  Avoid foods with partially hydrogenated oils in them.  These contain trans fats. Examples of foods that contain trans fats are stick margarine, some tub margarines, cookies, crackers, and other baked goods. WHAT GENERAL GUIDELINES DO I NEED TO FOLLOW? These guidelines for healthy eating will help you control your intake of fat and cholesterol:  Check food labels carefully to identify foods with trans fats or high amounts of saturated fat.  Fill one half of your plate with vegetables and green salads.  Fill one fourth of your plate with whole grains. Look for the word "whole" as the first word in the ingredient list.  Fill one fourth of your plate with lean protein foods.  Limit fruit to two servings a day. Choose fruit instead of juice.  Eat more foods that contain soluble fiber. Examples of foods that contain this type of fiber are apples, broccoli, carrots, beans, peas, and barley. Aim to get 20-30 g of fiber per day.  Eat more home-cooked food and less restaurant, buffet, and fast food.  Limit or avoid alcohol.  Limit foods high in starch and sugar.  Limit fried foods.  Cook foods using methods other than frying. Baking, boiling, grilling, and broiling are all great options.  Lose weight if you are overweight. Losing just 5-10% of your initial body weight can help your overall health and prevent diseases such as diabetes and heart disease. WHAT FOODS CAN I EAT? Grains Whole grains, such as whole wheat or whole grain breads, crackers, cereals, and pasta. Unsweetened oatmeal, bulgur, barley, quinoa, or brown rice. Corn or whole wheat flour tortillas. Vegetables Fresh or frozen vegetables (raw,  steamed, roasted, or grilled). Green salads. Fruits All fresh, canned (in natural juice), or frozen fruits. Meat and Other Protein Products Ground beef (85% or leaner), grass-fed beef, or beef trimmed of fat. Skinless chicken or [place]. Ground chicken or [place]. Pork trimmed of fat. All fish and seafood. Eggs. Dried beans, peas, or lentils.  Unsalted nuts or seeds. Unsalted canned or dry beans. Dairy Low-fat dairy products, such as skim or 1% milk, 2% or reduced-fat cheeses, low-fat ricotta or cottage cheese, or plain low-fat yogurt. Fats and Oils Tub margarines without trans fats. Light or reduced-fat mayonnaise and salad dressings. Avocado. Olive, canola, sesame, or safflower oils. Natural peanut or almond butter (choose ones without added sugar and oil). The items listed above may not be a complete list of recommended foods or beverages. Contact your dietitian for more options. WHAT FOODS ARE NOT RECOMMENDED? Grains White bread. White pasta. White rice. Cornbread. Bagels, pastries, and croissants. Crackers that contain trans fat. Vegetables White potatoes. Corn. Creamed or fried vegetables. Vegetables in a cheese sauce. Fruits Dried fruits. Canned fruit in light or heavy syrup. Fruit juice. Meat and Other Protein Products Fatty cuts of meat. Ribs, chicken wings, bacon, sausage, bologna, salami, chitterlings, fatback, hot dogs, bratwurst, and packaged luncheon meats. Liver and organ meats. Dairy Whole or 2% milk, cream, half-and-half, and cream cheese. Whole milk cheeses. Whole-fat or sweetened yogurt. Full-fat cheeses. Nondairy creamers and whipped toppings. Processed cheese, cheese spreads, or cheese curds. Sweets and Desserts Corn syrup, sugars, honey, and molasses. Candy. Jam and jelly. Syrup. Sweetened cereals. Cookies, pies, cakes, donuts, muffins, and ice cream. Fats and Oils Butter, stick margarine, lard, shortening, ghee, or bacon fat. Coconut, palm kernel, or palm oils. Beverages Alcohol. Sweetened drinks (such as sodas, lemonade, and fruit drinks or punches). The items listed above may not be a complete list of foods and beverages to avoid. Contact your dietitian for more information.   This information is not intended to replace advice given to you by your health care provider. Make sure you discuss any questions  you have with your health care provider.   Document Released: 11/26/2005 Document Revised: 12/17/2014 Document Reviewed: 02/24/2014 Elsevier Interactive Patient Education [employer]

## 2015-12-08 NOTE — Progress Notes (Signed)
Preformed 6 minute walk test with patient : Results were as follows: Without oxygen O2 level started at 93% and dropped to 88% when ambulating.  With oxygen on 3 Litters patient stated at 97% and dropped to 94% when ambulating.

## 2015-12-08 NOTE — Progress Notes (Signed)
Subjective:    Patient ID: Kenneth Casey, male    DOB: 07/03/1956, 59 y.o.   MRN: [id number]  HPI  [name], a 59 year old male presents accompanied by wife to establish care. Patient sates that he has not had a primary provider in a number of years. [name] was recently hospitalized with community acquired pneumonia. [name] states that he had a cough for 1 week stay prior to presenting to the emergency department. He was found to hae worsening SOP, hypoxia and a BNP of 703. He was eventually intubated for hypercarbic respiratory failure.  [name] was discharged  On 11/18/2015.  [name] has a history of COPD.  Symptoms dyspnea after several blocks, inability to climb stairs and wheezing  worsen with exertion.  Patient uses 2-3  pillows at night. Patient has witnessed apnea and snores (per spouse). He is currently on 3 liters of oxygen at home via nasal canula. Oxygen is managed by [facility]. [name] has a follow up appointment scheduled with Dr. Sherene SiresWert, pulmonologist on 12/20/2015. Patient's symptoms are currently controlled on Symbicort twice daily. He maintains that he has not required albuterol inhaler. [name] is a former smoker with a long history of tobacco use.   Patient is also complaining of edema to lower extremities. Patient complains of edema.   The edema has been moderate.  Onset of symptoms was several weeks ago, clinical course has been stable since that time. The edema is present primarily in the evenings.  The swelling has been aggravated by dependency of involved area and increased salt intake, relieved by elevation of involved area. He continues to take Furosemide 40 mg daily with moderate relief.  Past Medical History  Diagnosis Date  . COPD (chronic obstructive pulmonary disease) (HCC)   . Pneumonia   . Obese    Social History   Social History  . Marital Status: Married    Spouse Name: N/A  . Number of Children: N/A  . Years of  Education: N/A   Occupational History  . Not on file.   Social History Main Topics  . Smoking status: Former Smoker    Quit date: 01/08/2011  . Smokeless tobacco: Never Used  . Alcohol Use: No  . Drug Use: Not on file  . Sexual Activity: Not on file   Other Topics Concern  . Not on file   Social History Narrative    There is no immunization history on file for this patient.  Review of Systems  Constitutional: Positive for fatigue. Negative for fever and chills.  Respiratory: Positive for apnea, cough and shortness of breath.   Gastrointestinal: Negative for nausea, vomiting, diarrhea, constipation and rectal pain.  Endocrine: Positive for polyuria. Negative for polydipsia and polyphagia.  Genitourinary: Negative.  Negative for difficulty urinating.       Erectile dysfunction:   Musculoskeletal: Positive for gait problem (Patient ambulates with cane for stability).  Skin: Negative.   Neurological: Positive for headaches. Negative for dizziness and light-headedness.  Hematological: Negative.   Psychiatric/Behavioral: Negative.        Objective:   Physical Exam  Constitutional: He is oriented to person, place, and time.  Morbid obesity  HENT:  Head: Normocephalic and atraumatic.  Right Ear: Hearing, tympanic membrane, external ear and ear canal normal.  Left Ear: Hearing, tympanic membrane, external ear and ear canal normal.  Mouth/Throat: Uvula is midline and oropharynx is clear and moist.  Eyes: Conjunctivae and EOM are normal.  Pupils are equal, round, and reactive to light.  Neck: Normal range of motion. Neck supple.  Cardiovascular: Regular rhythm, S1 normal and S2 normal.  Tachycardia present.   Pulmonary/Chest: Effort normal. No apnea. No respiratory distress. He has wheezes in the right lower field.  Abdominal: Soft. Bowel sounds are normal.  Increased abdominal girth (pendulous)  Musculoskeletal: Normal range of motion.  Neurological: He is alert and oriented  to person, place, and time. No cranial nerve deficit or sensory deficit.  Skin: Skin is warm and dry. Rash noted. Rash is macular (erythematious, pruritiic, primarily to abdomen).  Psychiatric: He has a normal mood and affect. His behavior is normal. Judgment and thought content normal.      BP 142/82 mmHg  Pulse 106  Temp(Src) 97.7 F (36.5 C) (Oral)  Resp 18  Ht  (1.753 m)  Wt 312 lb (141.522 kg)  BMI 46.05 kg/m2  SpO2 97% Assessment & Plan:  1. Diastolic dysfunction Reviewed previous echocardiogram, EF 55-60 %. Echo also showed dialstolic dysfunction. Patient had a BNP of 703 while hospitalized. Will check BNP. Reviewed EKG, sinus tachycardia. Will start a dose of Coreg 6.25 mg BID. Will follow up in 1 month.  - Brain natriuretic peptide - COMPLETE METABOLIC PANEL WITH GFR - EKG 16-XWRU - carvedilol (COREG) 6.25 MG tablet; Take 1 tablet (6.25 mg total) by mouth 2 (two) times daily with a meal.  Dispense: 60 tablet; Refill: 0 - furosemide (LASIX) 40 MG tablet; Take 1 tablet (40 mg total) by mouth daily.  Dispense: 30 tablet; Refill: 2  2. COPD Continue Symbicort as previously prescribed. Patient is to follow up with Dr. Sherene Sires, pulmonologist  as scheduled. Continue home oxygen at 3L .   3. Morbid obesity, unspecified obesity type (HCC) Recommend a lowfat, low carbohydrate diet divided over 5-6 small meals, increase water intake to 2-3 glasses, and increase daily activity.  - Hemoglobin A1c - TSH  4. Leukocytosis Previous WBC count was 20.6, will repeat CBC.  - CBC with Differential  6. Bilateral edema of lower extremity Elevate lower extremities to heart level while at rest. Continue Furosemide as previously prescribed.   7. Elevated blood pressure - POCT urinalysis dipstick  8. Sinus tachycardia (HCC) - carvedilol (COREG) 6.25 MG tablet; Take 1 tablet (6.25 mg total) by mouth 2 (two) times daily with a meal.  Dispense: 60 tablet; Refill: 0  9. On home oxygen  therapy Will continue on 3 liters of oxygen. 6 minute walk test w/o oxygen saturation is 88%, with oxygen 97%. Will follow up with Dr. Sherene Sires 10. Insomnia Patient reports getting 3-4 hours of uninterrupted sleep per night. Will start a trial of amitriptyline 10 mg 1 hour prior to sleeping. Will follow up in 1 month.  - amitriptyline (ELAVIL) 10 MG tablet; Take 1 tablet (10 mg total) by mouth at bedtime as needed for sleep.  Dispense: 30 tablet; Refill: 0   RTC: 1 month for insomnia and diastolic dysfunction The patient was given clear instructions to go to ER or return to medical center if symptoms do not improve, worsen or new problems develop. The patient verbalized understanding. Will notify patient with laboratory results. Massie Maroon, FNP

## 2015-12-09 LAB — BRAIN NATRIURETIC PEPTIDE: Brain Natriuretic Peptide: 10.2 pg/mL (ref 0.0–100.0)

## 2015-12-13 ENCOUNTER — Emergency Department (HOSPITAL_COMMUNITY): Payer: Self-pay

## 2015-12-13 ENCOUNTER — Encounter (HOSPITAL_COMMUNITY): Payer: Self-pay | Admitting: Emergency Medicine

## 2015-12-13 ENCOUNTER — Inpatient Hospital Stay (HOSPITAL_COMMUNITY)
Admission: EM | Admit: 2015-12-13 | Discharge: 2015-12-15 | DRG: 190 | Disposition: A | Discharge status: Home / Self Care | Payer: Self-pay | Attending: Internal Medicine | Admitting: Internal Medicine

## 2015-12-13 DIAGNOSIS — Z6841 Body Mass Index (BMI) 40.0 and over, adult: Secondary | ICD-10-CM

## 2015-12-13 DIAGNOSIS — Z833 Family history of diabetes mellitus: Secondary | ICD-10-CM

## 2015-12-13 DIAGNOSIS — J449 Chronic obstructive pulmonary disease, unspecified: Secondary | ICD-10-CM | POA: Diagnosis present

## 2015-12-13 DIAGNOSIS — Z9981 Dependence on supplemental oxygen: Secondary | ICD-10-CM

## 2015-12-13 DIAGNOSIS — J189 Pneumonia, unspecified organism: Secondary | ICD-10-CM | POA: Diagnosis present

## 2015-12-13 DIAGNOSIS — E872 Acidosis: Secondary | ICD-10-CM | POA: Diagnosis present

## 2015-12-13 DIAGNOSIS — J9621 Acute and chronic respiratory failure with hypoxia: Secondary | ICD-10-CM | POA: Diagnosis present

## 2015-12-13 DIAGNOSIS — Z801 Family history of malignant neoplasm of trachea, bronchus and lung: Secondary | ICD-10-CM

## 2015-12-13 DIAGNOSIS — Z8249 Family history of ischemic heart disease and other diseases of the circulatory system: Secondary | ICD-10-CM

## 2015-12-13 DIAGNOSIS — Z87891 Personal history of nicotine dependence: Secondary | ICD-10-CM

## 2015-12-13 DIAGNOSIS — J440 Chronic obstructive pulmonary disease with acute lower respiratory infection: Principal | ICD-10-CM | POA: Diagnosis present

## 2015-12-13 DIAGNOSIS — Z803 Family history of malignant neoplasm of breast: Secondary | ICD-10-CM

## 2015-12-13 DIAGNOSIS — Y95 Nosocomial condition: Secondary | ICD-10-CM | POA: Diagnosis present

## 2015-12-13 DIAGNOSIS — G4700 Insomnia, unspecified: Secondary | ICD-10-CM

## 2015-12-13 DIAGNOSIS — I5032 Chronic diastolic (congestive) heart failure: Secondary | ICD-10-CM | POA: Diagnosis present

## 2015-12-13 DIAGNOSIS — D649 Anemia, unspecified: Secondary | ICD-10-CM | POA: Diagnosis present

## 2015-12-13 LAB — BASIC METABOLIC PANEL
ANION GAP: 9 (ref 5–15)
BUN: 14 mg/dL (ref 6–20)
CO2: 34 mmol/L — AB (ref 22–32)
Calcium: 9.1 mg/dL (ref 8.9–10.3)
Chloride: 99 mmol/L — ABNORMAL LOW (ref 101–111)
Creatinine, Ser: 0.91 mg/dL (ref 0.61–1.24)
GLUCOSE: 120 mg/dL — AB (ref 65–99)
POTASSIUM: 4.2 mmol/L (ref 3.5–5.1)
Sodium: 142 mmol/L (ref 135–145)

## 2015-12-13 LAB — BLOOD GAS, ARTERIAL
ACID-BASE EXCESS: 6.7 mmol/L — AB (ref 0.0–2.0)
BICARBONATE: 29.9 meq/L — AB (ref 20.0–24.0)
O2 CONTENT: 2 L/min
O2 SAT: 93.6 %
pCO2 arterial: 45.9 mmHg — ABNORMAL HIGH (ref 35.0–45.0)
pH, Arterial: 7.443 (ref 7.350–7.450)
pO2, Arterial: 66.9 mmHg — ABNORMAL LOW (ref 80.0–100.0)

## 2015-12-13 LAB — CBC
HEMATOCRIT: 42.7 % (ref 39.0–52.0)
Hemoglobin: 13.1 g/dL (ref 13.0–17.0)
MCH: 28.8 pg (ref 26.0–34.0)
MCHC: 30.7 g/dL (ref 30.0–36.0)
MCV: 93.8 fL (ref 78.0–100.0)
PLATELETS: 327 10*3/uL (ref 150–400)
RBC: 4.55 MIL/uL (ref 4.22–5.81)
RDW: 15.8 % — ABNORMAL HIGH (ref 11.5–15.5)
WBC: 9.3 10*3/uL (ref 4.0–10.5)

## 2015-12-13 LAB — I-STAT CG4 LACTIC ACID, ED: LACTIC ACID, VENOUS: 2.41 mmol/L — AB (ref 0.5–2.0)

## 2015-12-13 LAB — INFLUENZA PANEL BY PCR (TYPE A & B)
H1N1FLUPCR: NOT DETECTED
Influenza A By PCR: NEGATIVE
Influenza B By PCR: NEGATIVE

## 2015-12-13 LAB — BRAIN NATRIURETIC PEPTIDE: B Natriuretic Peptide: 54 pg/mL (ref 0.0–100.0)

## 2015-12-13 LAB — TROPONIN I

## 2015-12-13 MED ORDER — FLUTICASONE PROPIONATE 50 MCG/ACT NA SUSP: 2.0000 | Freq: Every day | NASAL | Status: DC | PRN

## 2015-12-13 MED ORDER — SODIUM CHLORIDE 0.9 % IJ SOLN
3.0000 mL | Freq: Two times a day (BID) | INTRAMUSCULAR | Status: DC
  Administered 2015-12-13 – 2015-12-14 (×2): 3 mL via INTRAVENOUS

## 2015-12-13 MED ORDER — ONDANSETRON HCL 4 MG PO TABS: 4.0000 mg | ORAL_TABLET | Freq: Four times a day (QID) | ORAL | Status: DC | PRN

## 2015-12-13 MED ORDER — DIPHENHYDRAMINE-ZINC ACETATE 2-0.1 % EX CREA
TOPICAL_CREAM | Freq: Two times a day (BID) | CUTANEOUS | Status: DC | PRN
  Administered 2015-12-13: 1 via TOPICAL
  Filled 2015-12-13: qty 28

## 2015-12-13 MED ORDER — IPRATROPIUM-ALBUTEROL 0.5-2.5 (3) MG/3ML IN SOLN
3.0000 mL | Freq: Once | RESPIRATORY_TRACT | Status: AC
  Administered 2015-12-13: 3 mL via RESPIRATORY_TRACT
  Filled 2015-12-13: qty 3

## 2015-12-13 MED ORDER — SODIUM CHLORIDE 0.9 % IV BOLUS (SEPSIS)
500.0000 mL | Freq: Once | INTRAVENOUS | Status: AC
  Administered 2015-12-13: 500 mL via INTRAVENOUS

## 2015-12-13 MED ORDER — AMITRIPTYLINE HCL 10 MG PO TABS
10.0000 mg | ORAL_TABLET | Freq: Every day | ORAL | Status: DC
  Administered 2015-12-13 – 2015-12-14 (×2): 10 mg via ORAL
  Filled 2015-12-13 (×2): qty 1

## 2015-12-13 MED ORDER — ENOXAPARIN SODIUM 80 MG/0.8ML ~~LOC~~ SOLN
70.0000 mg | SUBCUTANEOUS | Status: DC
  Administered 2015-12-13 – 2015-12-14 (×2): 70 mg via SUBCUTANEOUS
  Filled 2015-12-13 (×2): qty 0.8

## 2015-12-13 MED ORDER — ONDANSETRON HCL 4 MG/2ML IJ SOLN: 4.0000 mg | Freq: Four times a day (QID) | INTRAMUSCULAR | Status: DC | PRN

## 2015-12-13 MED ORDER — ACETAMINOPHEN 325 MG PO TABS
650.0000 mg | ORAL_TABLET | Freq: Four times a day (QID) | ORAL | Status: DC | PRN
  Administered 2015-12-15: 650 mg via ORAL
  Filled 2015-12-13: qty 2

## 2015-12-13 MED ORDER — BUDESONIDE-FORMOTEROL FUMARATE 80-4.5 MCG/ACT IN AERO
2.0000 | INHALATION_SPRAY | Freq: Two times a day (BID) | RESPIRATORY_TRACT | Status: DC
  Administered 2015-12-13 – 2015-12-15 (×4): 2 via RESPIRATORY_TRACT
  Filled 2015-12-13: qty 6.9

## 2015-12-13 MED ORDER — SENNOSIDES-DOCUSATE SODIUM 8.6-50 MG PO TABS: 1.0000 | ORAL_TABLET | Freq: Every evening | ORAL | Status: DC | PRN

## 2015-12-13 MED ORDER — ACETAMINOPHEN 650 MG RE SUPP: 650.0000 mg | Freq: Four times a day (QID) | RECTAL | Status: DC | PRN

## 2015-12-13 MED ORDER — PIPERACILLIN-TAZOBACTAM 4.5 G IVPB
4.5000 g | Freq: Once | INTRAVENOUS | Status: DC
  Filled 2015-12-13: qty 100

## 2015-12-13 MED ORDER — CARVEDILOL 3.125 MG PO TABS
6.2500 mg | ORAL_TABLET | Freq: Two times a day (BID) | ORAL | Status: DC
  Administered 2015-12-14 – 2015-12-15 (×3): 6.25 mg via ORAL
  Filled 2015-12-13 (×4): qty 2

## 2015-12-13 MED ORDER — VANCOMYCIN HCL 10 G IV SOLR
1500.0000 mg | Freq: Two times a day (BID) | INTRAVENOUS | Status: DC
  Administered 2015-12-14 – 2015-12-15 (×3): 1500 mg via INTRAVENOUS
  Filled 2015-12-13 (×5): qty 1500

## 2015-12-13 MED ORDER — VANCOMYCIN HCL 10 G IV SOLR
1500.0000 mg | Freq: Once | INTRAVENOUS | Status: AC
  Administered 2015-12-13: 1500 mg via INTRAVENOUS
  Filled 2015-12-13: qty 1500

## 2015-12-13 MED ORDER — SODIUM CHLORIDE 0.9 % IJ SOLN: 3.0000 mL | INTRAMUSCULAR | Status: DC | PRN

## 2015-12-13 MED ORDER — SODIUM CHLORIDE 0.9 % IV SOLN: 250.0000 mL | INTRAVENOUS | Status: DC | PRN

## 2015-12-13 MED ORDER — FUROSEMIDE 40 MG PO TABS
40.0000 mg | ORAL_TABLET | Freq: Every day | ORAL | Status: DC
  Administered 2015-12-14 – 2015-12-15 (×2): 40 mg via ORAL
  Filled 2015-12-13 (×3): qty 1

## 2015-12-13 MED ORDER — PIPERACILLIN SOD-TAZOBACTAM SO 2.25 (2-0.25) G IV SOLR
4.5000 g | Freq: Once | INTRAVENOUS | Status: DC
  Filled 2015-12-13: qty 4.5

## 2015-12-13 MED ORDER — DEXTROSE 5 % IV SOLN
1.0000 g | Freq: Three times a day (TID) | INTRAVENOUS | Status: DC
  Administered 2015-12-13 – 2015-12-15 (×6): 1 g via INTRAVENOUS
  Filled 2015-12-13 (×10): qty 1

## 2015-12-13 MED ORDER — VANCOMYCIN HCL IN DEXTROSE 1-5 GM/200ML-% IV SOLN
1000.0000 mg | Freq: Once | INTRAVENOUS | Status: AC
  Administered 2015-12-13: 1000 mg via INTRAVENOUS
  Filled 2015-12-13: qty 200

## 2015-12-13 NOTE — Progress Notes (Signed)
Patient reports Benadryl cream to abdomen was effective, no itching at this time.  Will continue to monitor.

## 2015-12-13 NOTE — ED Provider Notes (Signed)
CSN: [id number]     Arrival date & time 12/13/15  1108 History  By signing my name below, I, Marica Otter, attest that this documentation has been prepared under the direction and in the presence of Estela Y Hernandez Acost*. Electronically Signed: Marica Otter, ED Scribe. 12/13/2015. 12:47 PM.  Chief Complaint  Patient presents with  . Shortness of Breath  . Cough   HPI PCP: No PCP Per Patient HPI Comments: Kenneth Casey is a 60 y.o. male, with PMHx noted below including COPD and home O2 use at 3L/min, who presents to the Emergency Department complaining of intermittent, worsening productive cough onset 4 days ago. Pt notes movement makes the cough worse. Associated Sx include SOB; sore throat; chest tightness and abd pain with cough. Pt denies fever, chest pain, rhinorrhea, n/v/d, appetite change. Pt denies sick contact. Pt denies Hx of stent placement, heart attack, heart failure or DM. Pt further denies tobacco use. Pt confirms getting a flu shot this season.   Past Medical History  Diagnosis Date  . COPD (chronic obstructive pulmonary disease) (HCC)   . Pneumonia   . Obese    Past Surgical History  Procedure Laterality Date  . Cholecystectomy     Family History  Problem Relation Age of Onset  . Cancer Mother     breast cancer, lung cancer  . Diabetes Father   . Cancer Maternal Aunt   . Heart disease Paternal Uncle    Social History  Substance Use Topics  . Smoking status: Former Smoker    Quit date: 01/08/2011  . Smokeless tobacco: Never Used  . Alcohol Use: No    Review of Systems  A complete 10 system review of systems was obtained and all systems are negative except as noted in the HPI and PMH.   Allergies  Review of patient's allergies indicates no known allergies.  Home Medications   Prior to Admission medications   Medication Sig Start Date End Date Taking? Authorizing Provider  acetaminophen (TYLENOL) 500 MG tablet Take 1,000 mg by mouth every 6 (six)  hours as needed for moderate pain.   Yes Historical Provider, MD  albuterol (PROVENTIL HFA;VENTOLIN HFA) 108 (90 BASE) MCG/ACT inhaler Inhale 2 puffs into the lungs every 6 (six) hours as needed for wheezing or shortness of breath. 11/18/15  Yes Ripudeep Jenna Luo, MD  benzonatate (TESSALON) 100 MG capsule Take 1 capsule (100 mg total) by mouth 3 (three) times daily as needed for cough. 11/18/15  Yes Ripudeep Jenna Luo, MD  budesonide-formoterol (SYMBICORT) 80-4.5 MCG/ACT inhaler Inhale 2 puffs into the lungs 2 (two) times daily. 11/18/15  Yes Ripudeep Jenna Luo, MD  carvedilol (COREG) 6.25 MG tablet Take 1 tablet (6.25 mg total) by mouth 2 (two) times daily with a meal. 12/08/15  Yes Massie Maroon, FNP  fluticasone (FLONASE) 50 MCG/ACT nasal spray Place 2 sprays into both nostrils daily as needed for allergies or rhinitis.   Yes Historical Provider, MD  furosemide (LASIX) 40 MG tablet Take 1 tablet (40 mg total) by mouth daily. 12/08/15  Yes Massie Maroon, FNP  amitriptyline (ELAVIL) 10 MG tablet Take 1 tablet (10 mg total) by mouth at bedtime as needed for sleep. Patient not taking: Reported on 12/13/2015 12/08/15   Massie Maroon, FNP   Triage Vitals: BP 140/71 mmHg  Pulse 95  Temp(Src) 97.6 F (36.4 C) (Oral)  Resp 18  Ht 5\' 9"  (1.753 m)  Wt 312 lb (141.522 kg)  BMI 46.05 kg/m2  SpO2 97% Physical Exam  Constitutional: He is oriented to person, place, and time. He appears well-developed and well-nourished. No distress.  Morbid obesity   HENT:  Head: Normocephalic and atraumatic.  Mouth/Throat: Oropharynx is clear and moist. No oropharyngeal exudate.  Eyes: Conjunctivae and EOM are normal. Pupils are equal, round, and reactive to light.  Neck: Normal range of motion. Neck supple.  No meningismus.  Cardiovascular: Normal rate, regular rhythm, normal heart sounds and intact distal pulses.   No murmur heard. Pulmonary/Chest: Effort normal and breath sounds normal. No respiratory distress. He has  no wheezes.  Breath sounds diminished at bases. Speaking in full sentences.  Coarse rhonchi on R. Moist cough  Abdominal: Soft. There is no tenderness. There is no rebound and no guarding.  Musculoskeletal: Normal range of motion. He exhibits edema (trace peripheral edema). He exhibits no tenderness.  Neurological: He is alert and oriented to person, place, and time. No cranial nerve deficit. He exhibits normal muscle tone. Coordination normal.  No ataxia on finger to nose bilaterally. No pronator drift. 5/5 strength throughout. CN 2-12 intact.Equal grip strength. Sensation intact.   Skin: Skin is warm.  Psychiatric: He has a normal mood and affect. His behavior is normal.  Nursing note and vitals reviewed.   ED Course  Procedures (including critical care time) DIAGNOSTIC STUDIES: Oxygen Saturation is 97% on 3L/min.     COORDINATION OF CARE: 12:10 PM: Discussed treatment plan which includes breathing Tx, labs, chest xray, EKG with pt at bedside; patient verbalizes understanding and agrees with treatment plan.  Labs Review Labs Reviewed  CBC - Abnormal; Notable for the following:    RDW 15.8 (*)    All other components within normal limits  BASIC METABOLIC PANEL - Abnormal; Notable for the following:    Chloride 99 (*)    CO2 34 (*)    Glucose, Bld 120 (*)    All other components within normal limits  BLOOD GAS, ARTERIAL - Abnormal; Notable for the following:    pCO2 arterial 45.9 (*)    pO2, Arterial 66.9 (*)    Bicarbonate 29.9 (*)    Acid-Base Excess 6.7 (*)    All other components within normal limits  I-STAT CG4 LACTIC ACID, ED - Abnormal; Notable for the following:    Lactic Acid, Venous 2.41 (*)    All other components within normal limits  CULTURE, BLOOD (ROUTINE X 2)  CULTURE, BLOOD (ROUTINE X 2)  CULTURE, EXPECTORATED SPUTUM-ASSESSMENT  GRAM STAIN  BRAIN NATRIURETIC PEPTIDE  TROPONIN I  HIV ANTIBODY (ROUTINE TESTING)  STREP PNEUMONIAE URINARY ANTIGEN  INFLUENZA  PANEL BY PCR (TYPE A & B, H1N1)  LEGIONELLA ANTIGEN, URINE  BASIC METABOLIC PANEL  CBC    Imaging Review Dg Chest 2 View  12/13/2015  CLINICAL DATA:  Productive cough for 4 days EXAM: CHEST  2 VIEW COMPARISON:  11/17/2015 FINDINGS: Low lung volumes. Upper normal heart size. Right middle lobe atelectasis versus airspace disease. No pneumothorax or pleural effusion. IMPRESSION: Right middle lobe atelectasis versus airspace disease. Followup PA and lateral chest X-ray is recommended in 3-4 weeks following trial of antibiotic therapy to ensure resolution and exclude underlying malignancy. Electronically Signed   By: Jolaine ClickArthur  Hoss M.D.   On: 12/13/2015 12:46   I have personally reviewed and evaluated these images and lab results as part of my medical decision-making.   EKG Interpretation   Date/Time:  Tuesday December 13 2015 11:24:39 EST Ventricular Rate:  96 PR Interval:  185 QRS  Duration: 83 QT Interval:  316 QTC Calculation: 399 R Axis:   82 Text Interpretation:  Sinus rhythm No significant change was found  Confirmed by Manus Gunning  MD, Paytyn Mesta [id number]) on 12/13/2015 12:02:03 PM      MDM   Final diagnoses:  HCAP (healthcare-associated pneumonia)   patient with COPD on oxygen 3 L at baseline presenting with worsening cough, congestion and shortness of breath for the past 3 days. No pain except with coughing. Hospitalization last month for hyper chronic respiratory failure from pneumonia with intubation.  X-ray shows right middle lobe airspace disease. No hypoxia on home 3 L of oxygen. EKG is unchanged. White blood cell count normal.  Lactate mildly elevated at 2.4. With attempted ambulation, patient desaturated to 90% on 3 L and became quite short of breath and dyspneic. Denies chest pain.  Given recent hospitalization with intubation, will admit for HCAP and IV antibiotics.  ABG without CO2 retention.  D/w Dr. Ardyth Harps.   I personally performed the services described in this  documentation, which was scribed in my presence. The recorded information has been reviewed and is accurate.    Glynn Octave, MD 12/13/15 [phone]

## 2015-12-13 NOTE — ED Notes (Signed)
Having cough and SOB since Sunday.  Denies any pain except with cough to abdomen.  On O2@3l  at home.

## 2015-12-13 NOTE — Progress Notes (Signed)
ANTIBIOTIC CONSULT NOTE - INITIAL  Pharmacy Consult for vancomycin Indication: pneumonia  No Known Allergies  Patient Measurements: Height: 5\' 9"  (175.3 cm) Weight: (!) 315 lb (142.883 kg) IBW/kg (Calculated) : 70.7   Vital Signs: Temp: 98.5 F (36.9 C) (01/03 1450) Temp Source: Oral (01/03 1450) BP: 125/64 mmHg (01/03 1450) Pulse Rate: 89 (01/03 1450) Intake/Output from previous day:   Intake/Output from this shift:    Labs:  Recent Labs  12/13/15 1132  WBC 9.3  HGB 13.1  PLT 327  CREATININE 0.91   Estimated Creatinine Clearance: 126.2 mL/min (by C-G formula based on Cr of 0.91). No results for input(s): VANCOTROUGH, VANCOPEAK, VANCORANDOM, GENTTROUGH, GENTPEAK, GENTRANDOM, TOBRATROUGH, TOBRAPEAK, TOBRARND, AMIKACINPEAK, AMIKACINTROU, AMIKACIN in the last 72 hours.   Microbiology: No results found for this or any previous visit (from the past 720 hour(s)).  Medical History: Past Medical History  Diagnosis Date  . COPD (chronic obstructive pulmonary disease) (HCC)   . Pneumonia   . Obese     Medications:  Prescriptions prior to admission  Medication Sig Dispense Refill Last Dose  . acetaminophen (TYLENOL) 500 MG tablet Take 1,000 mg by mouth every 6 (six) hours as needed for moderate pain.   Past Week at Unknown time  . albuterol (PROVENTIL HFA;VENTOLIN HFA) 108 (90 BASE) MCG/ACT inhaler Inhale 2 puffs into the lungs every 6 (six) hours as needed for wheezing or shortness of breath. 1 Inhaler 2 12/13/2015 at Unknown time  . benzonatate (TESSALON) 100 MG capsule Take 1 capsule (100 mg total) by mouth 3 (three) times daily as needed for cough. 45 capsule 0 12/12/2015 at Unknown time  . budesonide-formoterol (SYMBICORT) 80-4.5 MCG/ACT inhaler Inhale 2 puffs into the lungs 2 (two) times daily. 1 Inhaler 12 12/13/2015 at Unknown time  . carvedilol (COREG) 6.25 MG tablet Take 1 tablet (6.25 mg total) by mouth 2 (two) times daily with a meal. 60 tablet 0 12/13/2015 at 0900  .  fluticasone (FLONASE) 50 MCG/ACT nasal spray Place 2 sprays into both nostrils daily as needed for allergies or rhinitis.   12/12/2015 at Unknown time  . furosemide (LASIX) 40 MG tablet Take 1 tablet (40 mg total) by mouth daily. 30 tablet 2 12/13/2015 at Unknown time  . amitriptyline (ELAVIL) 10 MG tablet Take 1 tablet (10 mg total) by mouth at bedtime as needed for sleep. (Patient not taking: Reported on 12/13/2015) 30 tablet 0 Not Taking at Unknown time   Assessment: 60 yo man with recent admission for respiratory failure from pneumonia requiring intubation.  He received vanc 1 gm earlier today in the ED.  Goal of Therapy:  Vancomycin trough level 15-20 mcg/ml  Plan:  Give an additional 1500 mg vanc now for total 2500 mg load then 1500 mg IV q12 hours. F/u renal function, cultures and clinical course  Amarilis Belflower Poteet 12/13/2015,3:56 PM

## 2015-12-13 NOTE — ED Notes (Signed)
Pt tolerated ambulation with steady gait with o2 sats initially at 94% but dropping to 90% on 3L.

## 2015-12-13 NOTE — Progress Notes (Signed)
Patient request sleep aide, takes amitriptyline at home. Patient also request cream for rash on abdomen. Dr. Ardyth HarpsHernandez notified. New order got amitriptyline 10mg  qhs and benadryl cream as needed for abdomen.

## 2015-12-13 NOTE — H&P (Signed)
[facility]          History and Physical    PCP:   No PCP Per Patient   EDP: Ezequiel Essex, M.D.  Chief Complaint:  Shortness of breath, cough  HPI: Patient is a 60 year old man with history of oxygen dependent COPD who was discharged from: Hospital on December 9 after a prolonged stay with septic shock secondary to severe community-acquired pneumonia and acute hypoxemic respiratory failure requiring intubation. He had been doing fairly well at home until 3 days ago when he started coughing, runny nose, headaches and shortness of breath increased. He comes to the emergency room where a chest x-ray is found to have right middle lobe atelectasis with airspace disease, slightly elevated lactic acid at 2.41. He was hypoxic to 89-90% on his regular 3 L of oxygen decision was made to admit him.  Allergies:  No Known Allergies    Past Medical History  Diagnosis Date  . COPD (chronic obstructive pulmonary disease) ([place])   . Pneumonia   . Obese     Past Surgical History  Procedure Laterality Date  . Cholecystectomy      Prior to Admission medications   Medication Sig Start Date End Date Taking? Authorizing Ricci Dirocco  acetaminophen (TYLENOL) 500 MG tablet Take 1,000 mg by mouth every 6 (six) hours as needed for moderate pain.   Yes Historical Reeta Kuk, MD  albuterol (PROVENTIL HFA;VENTOLIN HFA) 108 (90 BASE) MCG/ACT inhaler Inhale 2 puffs into the lungs every 6 (six) hours as needed for wheezing or shortness of breath. 11/18/15  Yes Ripudeep Krystal Eaton, MD  benzonatate (TESSALON) 100 MG capsule Take 1 capsule (100 mg total) by mouth 3 (three) times daily as needed for cough. 11/18/15  Yes Ripudeep Krystal Eaton, MD  budesonide-formoterol (SYMBICORT) 80-4.5 MCG/ACT inhaler Inhale 2 puffs into the lungs 2 (two) times daily. 11/18/15  Yes Ripudeep Krystal Eaton, MD  carvedilol (COREG) 6.25 MG tablet Take 1 tablet (6.25 mg total) by mouth 2 (two) times daily with a meal. 12/08/15  Yes  [name], FNP  fluticasone (FLONASE) 50 MCG/ACT nasal spray Place 2 sprays into both nostrils daily as needed for allergies or rhinitis.   Yes Historical Annaleese Guier, MD  furosemide (LASIX) 40 MG tablet Take 1 tablet (40 mg total) by mouth daily. 12/08/15  Yes [name], FNP  amitriptyline (ELAVIL) 10 MG tablet Take 1 tablet (10 mg total) by mouth at bedtime as needed for sleep. Patient not taking: Reported on 12/13/2015 12/08/15   [name], FNP    Social History:  reports that he quit smoking about 4 years ago. He has never used smokeless tobacco. He reports that he does not drink alcohol. His drug history is not on file.  Family History  Problem Relation Age of Onset  . Cancer Mother     breast cancer, lung cancer  . Diabetes Father   . Cancer Maternal Aunt   . Heart disease Paternal Uncle     Review of Systems:  Constitutional: Denies fever, chills, diaphoresis, appetite change and fatigue.  HEENT: Denies photophobia, eye pain, redness, hearing loss, ear pain, congestion, sore throat, rhinorrhea, sneezing, mouth sores, trouble swallowing, neck pain, neck stiffness and tinnitus.   Respiratory: Denies , chest tightness,  and wheezing.   Cardiovascular: Denies chest pain, palpitations and leg swelling.  Gastrointestinal: Denies nausea, vomiting, abdominal pain, diarrhea, constipation, blood in stool and abdominal distention.  Genitourinary: Denies dysuria, urgency, frequency, hematuria, flank pain and difficulty urinating.  Endocrine: Denies: hot or cold intolerance, sweats, changes in hair or nails, polyuria, polydipsia. Musculoskeletal: Denies myalgias, back pain, joint swelling, arthralgias and gait problem.  Skin: Denies pallor, rash and wound.  Neurological: Denies dizziness, seizures, syncope, weakness, light-headedness, numbness and headaches.  Hematological: Denies adenopathy. Easy bruising, personal or family bleeding history  Psychiatric/Behavioral: Denies  suicidal ideation, mood changes, confusion, nervousness, sleep disturbance and agitation   Physical Exam: Blood pressure 125/64, pulse 89, temperature 98.5 F (36.9 C), temperature source Oral, resp. rate 18, height '5\' 9"'  (1.753 m), weight 142.883 kg (315 lb), SpO2 97 %. General: Alert, awake, oriented 3, HEENT: Normocephalic, atraumatic, pupils equal and reactive to light, extraocular movements intact Neck: Supple, no JVD, no lymphadenopathy, no bruits, no goiter. Cardiovascular: Regular Rate and Rhythm, No Murmurs, Rubs or Gallops. Lungs: Crackles to the Right Mid and Base, No Wheezes or Rhonchi. Abdomen: Obese, soft, nontender, nondistended  extremities: 1+ pitting edema bilaterally Neurologic: Grossly intact and nonfocal  Labs on Admission:  Results for orders placed or performed during the hospital encounter of 12/13/15 (from the past 48 hour(s))  CBC     Status: Abnormal   Collection Time: 12/13/15 11:32 AM  Result Value Ref Range   WBC 9.3 4.0 - 10.5 K/uL   RBC 4.55 4.22 - 5.81 MIL/uL   Hemoglobin 13.1 13.0 - 17.0 g/dL   HCT 42.7 39.0 - 52.0 %   MCV 93.8 78.0 - 100.0 fL   MCH 28.8 26.0 - 34.0 pg   MCHC 30.7 30.0 - 36.0 g/dL   RDW 15.8 (H) 11.5 - 15.5 %   Platelets 327 150 - 400 K/uL  Basic metabolic panel     Status: Abnormal   Collection Time: 12/13/15 11:32 AM  Result Value Ref Range   Sodium 142 135 - 145 mmol/L   Potassium 4.2 3.5 - 5.1 mmol/L   Chloride 99 (L) 101 - 111 mmol/L   CO2 34 (H) 22 - 32 mmol/L   Glucose, Bld 120 (H) 65 - 99 mg/dL   BUN 14 6 - 20 mg/dL   Creatinine, Ser 0.91 0.61 - 1.24 mg/dL   Calcium 9.1 8.9 - 10.3 mg/dL   GFR calc non Af Amer >60 >60 mL/min   GFR calc Af Amer >60 >60 mL/min    Comment: (NOTE) The eGFR has been calculated using the CKD EPI equation. This calculation has not been validated in all clinical situations. eGFR's persistently <60 mL/min signify possible Chronic Kidney Disease.    Anion gap 9 5 - 15  Brain natriuretic  peptide     Status: None   Collection Time: 12/13/15 11:32 AM  Result Value Ref Range   B Natriuretic Peptide 54.0 0.0 - 100.0 pg/mL  Troponin I     Status: None   Collection Time: 12/13/15 11:32 AM  Result Value Ref Range   Troponin I <0.03 <0.031 ng/mL    Comment:        NO INDICATION OF MYOCARDIAL INJURY.   Blood gas, arterial ([facility] & [facility] ONLY)     Status: Abnormal   Collection Time: 12/13/15 12:39 PM  Result Value Ref Range   O2 Content 2.0 L/min   Delivery systems NASAL CANNULA    pH, Arterial 7.443 7.350 - 7.450   pCO2 arterial 45.9 (H) 35.0 - 45.0 mmHg   pO2, Arterial 66.9 (L) 80.0 - 100.0 mmHg   Bicarbonate 29.9 (H) 20.0 - 24.0 mEq/L  Acid-Base Excess 6.7 (H) 0.0 - 2.0 mmol/L   O2 Saturation 93.6 %   Collection site RIGHT RADIAL    Drawn by COLLECTED BY RT    Sample type ARTERIAL    Allens test (pass/fail) PASS PASS  I-Stat CG4 Lactic Acid, ED     Status: Abnormal   Collection Time: 12/13/15  1:00 PM  Result Value Ref Range   Lactic Acid, Venous 2.41 (HH) 0.5 - 2.0 mmol/L   Comment NOTIFIED PHYSICIAN     Radiological Exams on Admission: Dg Chest 2 View  12/13/2015  CLINICAL DATA:  Productive cough for 4 days EXAM: CHEST  2 VIEW COMPARISON:  11/17/2015 FINDINGS: Low lung volumes. Upper normal heart size. Right middle lobe atelectasis versus airspace disease. No pneumothorax or pleural effusion. IMPRESSION: Right middle lobe atelectasis versus airspace disease. Followup PA and lateral chest X-ray is recommended in 3-4 weeks following trial of antibiotic therapy to ensure resolution and exclude underlying malignancy. Electronically Signed   By: Marybelle Killings M.D.   On: 12/13/2015 12:46    Assessment/Plan Principal Problem:   HCAP (healthcare-associated pneumonia) Active Problems:   Morbid obesity ([place])   Acute on chronic respiratory failure (HCC)   Chronic diastolic CHF (congestive heart failure) (HCC)    Acute on chronic respiratory failure -Likely secondary to  pneumonia on top of COPD. See below for details.  Hospital-acquired pneumonia -Agree with vancomycin and cefepime initiated in the emergency department given recent prolonged hospitalization. -Blood and sputum cultures requested. -Strep pneumo and legionella urine antigen ordered.  Morbid obesity -Noted  Chronic diastolic CHF -Volume status appears compensated.  DVT prophylaxis -Lovenox  CODE STATUS -Full code as discussed with patient and wife at bedside.    Time Spent on Admission: 85 minutes  HERNANDEZ ACOSTA,ESTELA [facility] Pager: [phone] 12/13/2015, 3:51 PM

## 2015-12-14 DIAGNOSIS — I5032 Chronic diastolic (congestive) heart failure: Secondary | ICD-10-CM

## 2015-12-14 DIAGNOSIS — E669 Obesity, unspecified: Secondary | ICD-10-CM

## 2015-12-14 DIAGNOSIS — J449 Chronic obstructive pulmonary disease, unspecified: Secondary | ICD-10-CM | POA: Diagnosis present

## 2015-12-14 DIAGNOSIS — J189 Pneumonia, unspecified organism: Secondary | ICD-10-CM

## 2015-12-14 DIAGNOSIS — J9621 Acute and chronic respiratory failure with hypoxia: Secondary | ICD-10-CM

## 2015-12-14 LAB — CBC
HEMATOCRIT: 38.6 % — AB (ref 39.0–52.0)
HEMOGLOBIN: 11.8 g/dL — AB (ref 13.0–17.0)
MCH: 28.6 pg (ref 26.0–34.0)
MCHC: 30.6 g/dL (ref 30.0–36.0)
MCV: 93.5 fL (ref 78.0–100.0)
Platelets: 326 10*3/uL (ref 150–400)
RBC: 4.13 MIL/uL — ABNORMAL LOW (ref 4.22–5.81)
RDW: 15.8 % — AB (ref 11.5–15.5)
WBC: 8.4 10*3/uL (ref 4.0–10.5)

## 2015-12-14 LAB — BASIC METABOLIC PANEL
ANION GAP: 7 (ref 5–15)
BUN: 11 mg/dL (ref 6–20)
CHLORIDE: 101 mmol/L (ref 101–111)
CO2: 34 mmol/L — AB (ref 22–32)
Calcium: 8.5 mg/dL — ABNORMAL LOW (ref 8.9–10.3)
Creatinine, Ser: 0.92 mg/dL (ref 0.61–1.24)
GFR calc Af Amer: >60 mL/min (ref >60)
GFR calc non Af Amer: >60 mL/min (ref >60)
GLUCOSE: 97 mg/dL (ref 65–99)
POTASSIUM: 3.9 mmol/L (ref 3.5–5.1)
Sodium: 142 mmol/L (ref 135–145)

## 2015-12-14 LAB — HIV ANTIBODY (ROUTINE TESTING W REFLEX): HIV SCREEN 4TH GENERATION: NONREACTIVE

## 2015-12-14 MED ORDER — LEVALBUTEROL HCL 0.63 MG/3ML IN NEBU
0.6300 mg | INHALATION_SOLUTION | RESPIRATORY_TRACT | Status: DC | PRN
  Administered 2015-12-14 – 2015-12-15 (×2): 0.63 mg via RESPIRATORY_TRACT
  Filled 2015-12-14 (×2): qty 3

## 2015-12-14 NOTE — Progress Notes (Signed)
[facility] PROGRESS NOTE  Dorena DewHerman Etcheverry ZOX:[id number] DOB: 07/25/1956 DOA: 12/13/2015 PCP: No PCP Per Patient    Code Status: Full code Family Communication: Discussed with wife Disposition Plan: Anticipate discharge in the next 48 hours   Consultants:  None  Procedures:  None  Antibiotics:  Vancomycin 12/13/15>>  Cefepime 12/13/15>>  HPI/Subjective: Patient says that he is breathing somewhat better. No chest pain or pleurisy.  Objective: Filed Vitals:   12/14/15 0817 12/14/15 1500  BP:  127/65  Pulse: 100 90  Temp:  98.2 F (36.8 C)  Resp: 18 22   oxygen saturation 97% on nasal cannula oxygen.  Intake/Output Summary (Last 24 hours) at 12/14/15 1703 Last data filed at 12/14/15 1426  Gross per 24 hour  Intake   1183 ml  Output   2325 ml  Net  -1142 ml   Filed Weights   12/13/15 1116 12/13/15 1457  Weight: 141.522 kg (312 lb) 142.883 kg (315 lb)    Exam:   General:  Pleasant 60 year old Caucasian man in no acute distress.  Cardiovascular: S1, S2, with a soft systolic murmur.  Respiratory: Few crackles on the right, otherwise clear with some coarse breath sounds in the upper lobes.  Abdomen: Obese, positive bowel sounds, soft, nontender, nondistended.  Musculoskeletal/extremities: No pedal edema.  Neurologic: He is alert and oriented 3. Cranial nerves II through XII are grossly intact.   Data Reviewed: Basic Metabolic Panel:  Recent Labs Lab 12/08/15 1557 12/13/15 1132 12/14/15 0552  NA 138 142 142  K 4.2 4.2 3.9  CL 96* 99* 101  CO2 29 34* 34*  GLUCOSE 80 120* 97  BUN 15 14 11   CREATININE 0.89 0.91 0.92  CALCIUM 9.2 9.1 8.5*   Liver Function Tests:  Recent Labs Lab 12/08/15 1557  AST 16  ALT 22  ALKPHOS 65  BILITOT 0.4  PROT 7.0  ALBUMIN 3.7   No results for input(s): LIPASE, AMYLASE in the last 168 hours. No results for input(s): AMMONIA in the last 168 hours. CBC:  Recent Labs Lab 12/08/15 1557 12/13/15 1132  12/14/15 0552  WBC 10.6* 9.3 8.4  NEUTROABS 7.7  --   --   HGB 13.7 13.1 11.8*  HCT 42.7 42.7 38.6*  MCV 90.3 93.8 93.5  PLT 258 327 326   Cardiac Enzymes:  Recent Labs Lab 12/13/15 1132  TROPONINI <0.03   BNP (last 3 results)  Recent Labs  12/13/15 1132  BNP 54.0    ProBNP (last 3 results) No results for input(s): PROBNP in the last 8760 hours.  CBG: No results for input(s): GLUCAP in the last 168 hours.  Recent Results (from the past 240 hour(s))  Blood culture (routine x 2)     Status: None (Preliminary result)   Collection Time: 12/13/15 12:56 PM  Result Value Ref Range Status   Specimen Description BLOOD RIGHT ANTECUBITAL  Final   Special Requests   Final    BOTTLES DRAWN AEROBIC AND ANAEROBIC AEB=12CC ANA=8CC   Culture NO GROWTH < 24 HOURS  Final   Report Status PENDING  Incomplete  Blood culture (routine x 2)     Status: None (Preliminary result)   Collection Time: 12/13/15 12:58 PM  Result Value Ref Range Status   Specimen Description BLOOD LEFT ANTECUBITAL DRAWN BY RN  Final   Special Requests   Final    BOTTLES DRAWN AEROBIC AND ANAEROBIC AEB=8CC ANA=6CC   Culture NO GROWTH < 24 HOURS  Final   Report Status PENDING  Incomplete     Studies: Dg Chest 2 View  12/13/2015  CLINICAL DATA:  Productive cough for 4 days EXAM: CHEST  2 VIEW COMPARISON:  11/17/2015 FINDINGS: Low lung volumes. Upper normal heart size. Right middle lobe atelectasis versus airspace disease. No pneumothorax or pleural effusion. IMPRESSION: Right middle lobe atelectasis versus airspace disease. Followup PA and lateral chest X-ray is recommended in 3-4 weeks following trial of antibiotic therapy to ensure resolution and exclude underlying malignancy. Electronically Signed   By: Jolaine Click M.D.   On: 12/13/2015 12:46    Scheduled Meds: . amitriptyline  10 mg Oral QHS  . budesonide-formoterol  2 puff Inhalation BID  . carvedilol  6.25 mg Oral BID WC  . ceFEPime (MAXIPIME) IV  1 g  Intravenous 3 times per day  . enoxaparin (LOVENOX) injection  70 mg Subcutaneous Q24H  . furosemide  40 mg Oral Daily  . sodium chloride  3 mL Intravenous Q12H  . vancomycin  1,500 mg Intravenous Q12H   Continuous Infusions:  Assessment and plan: Principal Problem:   HCAP (healthcare-associated pneumonia) Active Problems:   Acute on chronic respiratory failure with hypoxia (HCC)   Chronic diastolic CHF (congestive heart failure) (HCC)   COPD (chronic obstructive pulmonary disease) (H[facility])   Morbid obesity (HCC)   Obesity  1. Healthcare associated pneumonia. The patient was recently hospitalized in December after a prolonged stay with ventilator dependent respiratory failure secondary to septic shock, pneumonia, and hypoxic respiratory failure. He returned on 12/13/15 with shortness of breath and cough. Chest x-ray in the ED revealed right middle lobe atelectasis versus airspace disease. ABG on 2 L of oxygen on admission revealed a pH of 7.4, PCO2 of 46, and PO2 of 67. Cefepime and vancomycin were started. He was continued on oxygen. -Influenza was negative. HIV was nonreactive. Blood cultures are negative to date. Urine Legionella antigen and strep pneumo antigen pending.  Acute on chronic respiratory failure with hypoxia. Patient is treated with 2-3 L of nasal cannula oxygen. His oxygen saturations were lower than his normal range on admission. Oxygen will be titrated to keep his oxygen saturations 90% or greater.  Chronic oxygen dependent COPD. Patient has no audible wheezing, so we'll hold off on steroid therapy. Will continue Symbicort and when necessary Xopenex.  Chronic diastolic heart failure. This is currently compensated. He was continued on Lasix.  Time spent: 35 minutes    [facility]  [facility] Pager [phone]. If 7PM-7AM, please contact night-coverage at [email or web address], password [facility] 12/14/2015, 5:03 PM  LOS: 1 day

## 2015-12-14 NOTE — Care Management Note (Signed)
Case Management Note  Patient Details  Name: Kenneth Casey MRN: [id number] Date of Birth: 10/17/1956  Subjective/Objective:                  Pt admitted from home with pneumonia. Pt lives with his wife and will return home at discharge. Pt is independent with ADL's. Pt has a cane, walker, home O2 and neb machine from home.  Action/Plan: [facility] [place]. No CM needs noted.  Expected Discharge Date:                  Expected Discharge Plan:  Home/Self Care  In-House Referral:  Financial Counselor  Discharge planning Services  CM Consult  Post Acute Care Choice:  NA Choice offered to:  NA  DME Arranged:    DME Agency:     HH Arranged:    HH Agency:     Status of Service:  Completed, signed off  Medicare Important Message Given:    Date Medicare IM Given:    Medicare IM give by:    Date Additional Medicare IM Given:    Additional Medicare Important Message give by:     If discussed at Long Length of Stay Meetings, dates discussed:    Additional Comments:  Kenneth Casey, Kenneth Bunte Crowder, RN 12/14/2015, 11:01 AM

## 2015-12-15 ENCOUNTER — Ambulatory Visit: Payer: Self-pay

## 2015-12-15 LAB — STREP PNEUMONIAE URINARY ANTIGEN: Strep Pneumo Urinary Antigen: NEGATIVE

## 2015-12-15 LAB — MRSA PCR SCREENING: MRSA BY PCR: NEGATIVE

## 2015-12-15 MED ORDER — AMITRIPTYLINE HCL 10 MG PO TABS: 10.0000 mg | ORAL_TABLET | Freq: Every evening | ORAL | Status: DC | PRN

## 2015-12-15 MED ORDER — ALBUTEROL SULFATE (2.5 MG/3ML) 0.083% IN NEBU
INHALATION_SOLUTION | RESPIRATORY_TRACT | Status: DC
Start: 2015-12-15 — End: 2016-05-28

## 2015-12-15 MED ORDER — CEFUROXIME AXETIL 500 MG PO TABS: 500.0000 mg | ORAL_TABLET | Freq: Two times a day (BID) | ORAL | Status: DC

## 2015-12-15 MED ORDER — BENZONATATE 100 MG PO CAPS: 100.0000 mg | ORAL_CAPSULE | Freq: Three times a day (TID) | ORAL | Status: DC | PRN

## 2015-12-15 MED ORDER — PREDNISONE 20 MG PO TABS: 20.0000 mg | ORAL_TABLET | Freq: Every day | ORAL | Status: DC

## 2015-12-15 MED FILL — CEFUROXIME AXETIL 500 MG TA: 500 | 5 days supply | Qty: 10 | Fill #0

## 2015-12-15 MED FILL — predniSONE 20 MG TABS: 20 | 3 days supply | Qty: 3 | Fill #0

## 2015-12-15 MED FILL — BENZONATATE 100 MG CAPSULE: 100 | 15 days supply | Qty: 45 | Fill #0

## 2015-12-15 MED FILL — ?ALBUTEROL SUL 2.5 MG/3 MLS: (2.5 MG/3ML | 30 days supply | Qty: 90 | Fill #0

## 2015-12-15 MED FILL — AMITRIPTYLINE HCL 10 MG TAB: 10 | 30 days supply | Qty: 30 | Fill #0

## 2015-12-15 NOTE — Care Management Note (Signed)
Case Management Note  Patient Details  Name: Kenneth Casey MRN: [id number] Date of Birth: 12/16/1955  Subjective/Objective:                    Action/Plan:   Expected Discharge Date:                  Expected Discharge Plan:  Home/Self Care  In-House Referral:  Financial Counselor  Discharge planning Services  CM Consult  Post Acute Care Choice:  NA Choice offered to:  NA  DME Arranged:    DME Agency:     HH Arranged:    HH Agency:     Status of Service:  Completed, signed off  Medicare Important Message Given:    Date Medicare IM Given:    Medicare IM give by:    Date Additional Medicare IM Given:    Additional Medicare Important Message give by:     If discussed at Long Length of Stay Meetings, dates discussed:    Additional Comments: Pt discharged home today. No CM needs noted.  Arlyss QueenBlackwell, Kenneth Colvin [name], RN 12/15/2015, 1:51 PM

## 2015-12-15 NOTE — Discharge Summary (Signed)
Physician Discharge Summary  Romano Stigger XBJ:[id number] DOB: 07-18-1956 DOA: 12/13/2015  PCP: Massie Maroon, FNP  Admit date: 12/13/2015 Discharge date: 12/15/2015  Time spent: Greater than 30 minutes  Recommendations for Outpatient Follow-up:  1. Recommend follow-up compliance of chronic oxygen therapy.   Discharge Diagnoses:  1. Healthcare associated pneumonia. 2. Acute on chronic respiratory failure with hypoxia. 3. COPD without exacerbation. 4. Chronic diastolic congestive heart failure. Remain compensated. 5. Morbid obesity. 6. Mild normocytic anemia, in part, hemodilutional. 7. Lactic acidosis.  Discharge Condition: Improved.  Diet recommendation: Heart healthy.  Filed Weights   12/13/15 1116 12/13/15 1457  Weight: 141.522 kg (312 lb) 142.883 kg (315 lb)    History of present illness:  The patient is a 60 year old man with history of oxygen dependent COPD. He was recently hospitalized in December for septic shock secondary to community-acquired pneumonia which required intubation. He had been doing fairly well at home until approximately 3 days prior to his presentation to the ED again on 12/12/2014. He complained of shortness of breath, coughing, runny nose, and a headache. In the ED, his chest x-ray revealed right middle lobe atelectasis or infiltrate/airspace disease. His lactic acid was 2.41. He was slightly more hypoxic than usual, oxygenating 88-90% on his usual 3 L of oxygen. He was admitted for further evaluation and management.  Hospital Course:   1. Healthcare associated pneumonia. The patient was recently hospitalized in December after a prolonged stay with ventilator dependent respiratory failure secondary to septic shock, pneumonia, and hypoxic respiratory failure. He returned on 12/13/15 with shortness of breath and cough. Chest x-ray in the ED revealed right middle lobe atelectasis versus airspace disease. ABG on 2 L of oxygen on admission revealed a pH of 7.4,  PCO2 of 46, and PO2 of 67. Cefepime and vancomycin were started. He was continued on oxygen. -Influenza was negative. HIV was nonreactive. Blood cultures were negative to date. Urine Legionella antigen was pending at the time of discharge. Strep will antigen was negative. -Patient improved clinically and symptomatically. His oxygen saturations improved progressively. He did not have significant wheezing, however, at the time of discharge, it was felt that a few days of prednisone would be helpful. Therefore, he was discharged on 3 more days of prednisone along with 5 more days of Ceftin for ongoing treatment. He was also given a prescription for albuterol nebulizer to be used 3 times a day for 5 more days and then every 4-6 hours thereafter. He was encouraged to wear his oxygen more consistently at home.  Acute on chronic respiratory failure with hypoxia. Patient is treated with 2-3 L of nasal cannula oxygen. His oxygen saturations were lower than his normal range on admission. Oxygen was  titrated to keep his oxygen saturations 90% or greater. His wife stated that the patient did not wear his oxygen 24/7. Patient was encouraged to wear his oxygen close 24 hours a day 7 days a week as possible. He voiced understanding.  Chronic oxygen dependent COPD. He was continued on Symbicort. There was no significant wheezing on admission, but he did have occasional wheezes prior to discharge, therefore, he was discharged on a very short prednisone taper.   Chronic diastolic heart failure. His heart failure was  compensated. He was continued on Lasix.  Procedures:  None  Consultations:  None  Discharge Exam: Filed Vitals:   12/15/15 0715 12/15/15 0739  BP:    Pulse: 97 60  Temp:    Resp: 18 17   temperature 99.1. Pulse  60. Respiratory rate 17. Blood pressure 109/64. Oxygen saturation on 3 L oxygen 98%.  General: Pleasant 60 year old Caucasian man in no acute distress. Overall, he looks  better.  Cardiovascular: S1, S2, with a soft systolic murmur.  Respiratory: Breathing is nonlabored. Coarse breath sounds with occasional wheezes, but good air movement down to the bases.  Abdomen: Obese, positive bowel sounds, soft, nontender, nondistended.  Musculoskeletal/extremities: No pedal edema.  Neurologic: He is alert and oriented 3. Cranial nerves II through XII are grossly intact.  Discharge Instructions   Discharge Instructions    Diet - low sodium heart healthy    Complete by:  As directed      Increase activity slowly    Complete by:  As directed           Current Discharge Medication List    START taking these medications   Details  albuterol (PROVENTIL) (2.5 MG/3ML) 0.083% nebulizer solution Use 3 time daily for 5 days; then every 4 hours as needed thereafter. Qty: 75 mL, Refills: 12    cefUROXime (CEFTIN) 500 MG tablet Take 1 tablet (500 mg total) by mouth 2 (two) times daily with a meal. Antibiotic to be taken for 5 more days starting 12/16/2015 Qty: 10 tablet, Refills: 0    predniSONE (DELTASONE) 20 MG tablet Take 1 tablet (20 mg total) by mouth daily with supper. Take for 3 days. Qty: 3 tablet, Refills: 0      CONTINUE these medications which have CHANGED   Details  amitriptyline (ELAVIL) 10 MG tablet Take 1 tablet (10 mg total) by mouth at bedtime as needed for sleep. Qty: 30 tablet, Refills: 0   Associated Diagnoses: Insomnia    benzonatate (TESSALON) 100 MG capsule Take 1 capsule (100 mg total) by mouth 3 (three) times daily as needed for cough. Qty: 45 capsule, Refills: 0      CONTINUE these medications which have NOT CHANGED   Details  acetaminophen (TYLENOL) 500 MG tablet Take 1,000 mg by mouth every 6 (six) hours as needed for moderate pain.    albuterol (PROVENTIL HFA;VENTOLIN HFA) 108 (90 BASE) MCG/ACT inhaler Inhale 2 puffs into the lungs every 6 (six) hours as needed for wheezing or shortness of breath. Qty: 1 Inhaler, Refills: 2     budesonide-formoterol (SYMBICORT) 80-4.5 MCG/ACT inhaler Inhale 2 puffs into the lungs 2 (two) times daily. Qty: 1 Inhaler, Refills: 12    carvedilol (COREG) 6.25 MG tablet Take 1 tablet (6.25 mg total) by mouth 2 (two) times daily with a meal. Qty: 60 tablet, Refills: 0   Associated Diagnoses: Diastolic dysfunction; Sinus tachycardia (HCC)    fluticasone (FLONASE) 50 MCG/ACT nasal spray Place 2 sprays into both nostrils daily as needed for allergies or rhinitis.    furosemide (LASIX) 40 MG tablet Take 1 tablet (40 mg total) by mouth daily. Qty: 30 tablet, Refills: 2   Associated Diagnoses: Diastolic dysfunction       No Known Allergies    The results of significant diagnostics from this hospitalization (including imaging, microbiology, ancillary and laboratory) are listed below for reference.    Significant Diagnostic Studies: Dg Chest 2 View  12/13/2015  CLINICAL DATA:  Productive cough for 4 days EXAM: CHEST  2 VIEW COMPARISON:  11/17/2015 FINDINGS: Low lung volumes. Upper normal heart size. Right middle lobe atelectasis versus airspace disease. No pneumothorax or pleural effusion. IMPRESSION: Right middle lobe atelectasis versus airspace disease. Followup PA and lateral chest X-ray is recommended in 3-4 weeks following trial of  antibiotic therapy to ensure resolution and exclude underlying malignancy. Electronically Signed   By: Jolaine ClickArthur  Hoss M.D.   On: 12/13/2015 12:46   Dg Chest 2 View  11/17/2015  CLINICAL DATA:  Followup respiratory failure EXAM: CHEST  2 VIEW COMPARISON:  Radiograph 11/11/2015 FINDINGS: Stable enlarged cardiac silhouette. Post extubation. Improvement in basilar atelectasis. There is volume loss in the RIGHT hemi thorax unchanged from prior. There is improvement in the airspace disease in the RIGHT lung compared to 11/11/2015. LEFT lung is clear. IMPRESSION: 1. Improvement in aeration to the RIGHT lung with persistent volume loss. 2. Post extubation without  complication. Electronically Signed   By: Genevive BiStewart  Edmunds M.D.   On: 11/17/2015 10:19    Microbiology: Recent Results (from the past 240 hour(s))  Blood culture (routine x 2)     Status: None (Preliminary result)   Collection Time: 12/13/15 12:56 PM  Result Value Ref Range Status   Specimen Description BLOOD RIGHT ANTECUBITAL  Final   Special Requests   Final    BOTTLES DRAWN AEROBIC AND ANAEROBIC AEB=12CC ANA=8CC   Culture NO GROWTH 2 DAYS  Final   Report Status PENDING  Incomplete  Blood culture (routine x 2)     Status: None (Preliminary result)   Collection Time: 12/13/15 12:58 PM  Result Value Ref Range Status   Specimen Description BLOOD LEFT ANTECUBITAL DRAWN BY RN  Final   Special Requests   Final    BOTTLES DRAWN AEROBIC AND ANAEROBIC AEB=8CC ANA=6CC   Culture NO GROWTH 2 DAYS  Final   Report Status PENDING  Incomplete  MRSA PCR Screening     Status: None   Collection Time: 12/15/15 10:00 AM  Result Value Ref Range Status   MRSA by PCR NEGATIVE NEGATIVE Final    Comment:        The GeneXpert MRSA Assay (FDA approved for NASAL specimens only), is one component of a comprehensive MRSA colonization surveillance program. It is not intended to diagnose MRSA infection nor to guide or monitor treatment for MRSA infections.      Labs: Basic Metabolic Panel:  Recent Labs Lab 12/08/15 1557 12/13/15 1132 12/14/15 0552  NA 138 142 142  K 4.2 4.2 3.9  CL 96* 99* 101  CO2 29 34* 34*  GLUCOSE 80 120* 97  BUN 15 14 11   CREATININE 0.89 0.91 0.92  CALCIUM 9.2 9.1 8.5*   Liver Function Tests:  Recent Labs Lab 12/08/15 1557  AST 16  ALT 22  ALKPHOS 65  BILITOT 0.4  PROT 7.0  ALBUMIN 3.7   No results for input(s): LIPASE, AMYLASE in the last 168 hours. No results for input(s): AMMONIA in the last 168 hours. CBC:  Recent Labs Lab 12/08/15 1557 12/13/15 1132 12/14/15 0552  WBC 10.6* 9.3 8.4  NEUTROABS 7.7  --   --   HGB 13.7 13.1 11.8*  HCT 42.7 42.7  38.6*  MCV 90.3 93.8 93.5  PLT 258 327 326   Cardiac Enzymes:  Recent Labs Lab 12/13/15 1132  TROPONINI <0.03   BNP: BNP (last 3 results)  Recent Labs  12/13/15 1132  BNP 54.0    ProBNP (last 3 results) No results for input(s): PROBNP in the last 8760 hours.  CBG: No results for input(s): GLUCAP in the last 168 hours.     Signed:  Marisa Hufstetler MD  FACP  [facility] 12/15/2015, 1:05 PM

## 2015-12-15 NOTE — Progress Notes (Signed)
Instructions given on discharge medications,and follow up appointments,patient,and family verbalized understanding. Prescription given to patient. Accompanied by staff to an awaiting vehicle.

## 2015-12-16 LAB — LEGIONELLA ANTIGEN, URINE

## 2015-12-18 LAB — CULTURE, BLOOD (ROUTINE X 2)
CULTURE: NO GROWTH
Culture: NO GROWTH

## 2015-12-20 ENCOUNTER — Ambulatory Visit (INDEPENDENT_AMBULATORY_CARE_PROVIDER_SITE_OTHER): Payer: Self-pay | Admitting: Internal Medicine

## 2015-12-20 ENCOUNTER — Encounter: Payer: Self-pay | Admitting: Internal Medicine

## 2015-12-20 VITALS — BP 140/80 | HR 89 | Ht 69.0 in | Wt 308.0 lb

## 2015-12-20 DIAGNOSIS — J9611 Chronic respiratory failure with hypoxia: Secondary | ICD-10-CM

## 2015-12-20 DIAGNOSIS — J449 Chronic obstructive pulmonary disease, unspecified: Secondary | ICD-10-CM

## 2015-12-20 DIAGNOSIS — J9612 Chronic respiratory failure with hypercapnia: Secondary | ICD-10-CM

## 2015-12-20 DIAGNOSIS — J189 Pneumonia, unspecified organism: Secondary | ICD-10-CM

## 2015-12-20 NOTE — Patient Instructions (Signed)
For cough mucinex dm 1200 mg every 12 hours as needed   Plan A = automatic = Symbicort 160 Take 2 puffs first thing in am and then another 2 puffs about 12 hours later.   Plan B= Backup Only use your albuterol as a rescue medication to be used if you can't catch your breath by resting or doing a relaxed purse lip breathing pattern.  - The less you use it, the better it will work when you need it. - Ok to use up to 2 puffs  every 4 hours if you must but call for immediate appointment if use goes up over your usual need - Don't leave home without it !!  (think of it like the spare tire for your car)   Plan C = crisis Only use nebulizer if you try the ventolin first and it doesn't work   Try prilosec otc 20mg   Take 30-60 min before first meal of the day and Pepcid ac (famotidine) 20 mg one @  bedtime until cough is completely gone for at least a week without the need for cough suppression  GERD (REFLUX)  is an extremely common cause of respiratory symptoms just like yours , many times with no obvious heartburn at all.    It can be treated with medication, but also with lifestyle changes including elevation of the head of your bed (ideally with 6 inch  bed blocks),  Smoking cessation, avoidance of late meals, excessive alcohol, and avoid fatty foods, chocolate, peppermint, colas, red wine, and acidic juices such as orange juice.  NO MINT OR MENTHOL PRODUCTS SO NO COUGH DROPS  USE SUGARLESS CANDY INSTEAD (Jolley ranchers or Stover's or Life Savers) or even ice chips will also do - the key is to swallow to prevent all throat clearing. NO OIL BASED VITAMINS - use powdered substitutes.    Please schedule a follow up office visit in 4 weeks, sooner if needed with cxr

## 2015-12-20 NOTE — Progress Notes (Signed)
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 Recommend follow-up compliance of chronic oxygen therapy.   Discharge Diagnoses:  1. Healthcare associated pneumonia. 2. Acute on chronic respiratory failure with hypoxia. 3. COPD without exacerbation. 4. Chronic diastolic congestive heart failure. Remain compensated. 5. Morbid obesity. 6. Mild normocytic anemia, in part, hemodilutional. 7. Lactic acidosis.  Discharge Condition: Improved.  Diet recommendation: Heart healthy.  Filed Weights   12/13/15 1116 12/13/15 1457  Weight: 141.522 kg (312 lb) 142.883 kg (315 lb)    History of present illness:  The patient is a 60 year old man with history of oxygen dependent COPD. He was recently hospitalized in December for septic shock secondary to community-acquired pneumonia which required intubation. He had been doing fairly well at home until approximately 3 days prior to his presentation to the ED again on 12/12/2014. He complained of shortness of breath, coughing, runny nose, and a headache. In the ED, his chest x-ray revealed right middle lobe atelectasis or infiltrate/airspace disease. His lactic acid was 2.41. He was slightly more hypoxic than usual, oxygenating 88-90% on his usual 3 L of oxygen. He was admitted for further evaluation and management.  Hospital Course:   1. Healthcare associated pneumonia. The patient was recently hospitalized in December after a prolonged stay with ventilator dependent respiratory failure secondary to septic shock, pneumonia, and hypoxic respiratory failure. He returned on 12/13/15 with  shortness of breath and cough. Chest x-ray in the ED revealed right middle lobe atelectasis versus airspace disease. ABG on 2 L of oxygen on admission revealed a pH of 7.4, PCO2 of 46, and PO2 of 67. Cefepime and vancomycin were started. He was continued on oxygen. -Influenza was negative. HIV was nonreactive. Blood cultures were negative to date. Urine Legionella antigen was pending at the time of discharge. Strep will antigen was negative. -Patient improved clinically and symptomatically. His oxygen saturations improved progressively. He did not have significant wheezing, however, at the time of discharge, it was felt that a few days of prednisone would be helpful. Therefore, he was discharged on 3 more days of prednisone along with 5 more days of Ceftin for ongoing treatment. He was also given a prescription for albuterol nebulizer to be used 3 times a day for 5 more days and then every 4-6 hours thereafter. He was encouraged to wear his oxygen more consistently at home.  Acute on chronic respiratory failure with hypoxia. Patient is treated with 2-3 L of nasal cannula oxygen. His oxygen saturations were lower than his normal range on admission. Oxygen was titrated to keep his oxygen saturations 90% or greater. His wife stated that the patient did not wear his oxygen 24/7. Patient was encouraged to wear his oxygen close 24 hours a day 7 days a week as possible. He voiced understanding.  Chronic oxygen dependent COPD. He was continued on Symbicort. There was no significant wheezing on admission, but he did have occasional wheezes prior to discharge, therefore, he was discharged on a very short prednisone taper.   Chronic diastolic heart failure. His heart failure was compensated. He was continued  on Lasix.       12/21/2015 1st [facility] office visit/ [facility]   Chief Complaint  Patient presents with  . HFU    Pt states that his breathing has improved some, but not baseline for him. He c/o  chest congestion and has had some cough with clear sputum. He has been wheezing some. He is using albuterol inhaler 4-5 x per day and neb 2-4 x per day.   has very poor hfa technique on symbicort 160 / cough worse in am Doe = MMRC2 = can't walk a nl pace on a flat grade s sob    No obvious day to day or daytime variability or assoc excess/ purulent sputum or mucus plugs   or cp or chest tightness, subjective wheeze or overt sinus or hb symptoms. No unusual exp hx or h/o childhood pna/ asthma or knowledge of premature birth.  Sleeping ok without nocturnal  or early am exacerbation  of respiratory  c/o's or need for noct saba. Also denies any obvious fluctuation of symptoms with weather or environmental changes or other aggravating or alleviating factors except as outlined above   Current Medications, Allergies, Complete Past Medical History, Past Surgical History, Family History, and Social History were reviewed in [facility] electronic medical record.  ROS  The following are not active complaints unless bolded sore throat, dysphagia, dental problems, itching, sneezing,  nasal congestion or excess/ purulent secretions, ear ache,   fever, chills, sweats, unintended wt loss, classically pleuritic or exertional cp, hemoptysis,  orthopnea pnd or leg swelling, presyncope, palpitations, abdominal pain, anorexia, nausea, vomiting, diarrhea  or change in bowel or bladder habits, change in stools or urine, dysuria,hematuria,  rash, arthralgias, visual complaints, headache, numbness, weakness or ataxia or problems with walking or coordination,  change in mood/affect or memory.       Review of Systems     Objective:   Physical Exam    obese amb wm congested sounding cough   Wt Readings from Last 3 Encounters:  12/20/15 308 lb (139.708 kg)  12/13/15 315 lb (142.883 kg)  12/08/15 312 lb (141.522 kg)    Vital signs reviewed    HEENT: nl dentition, turbinates, and oropharynx. Nl external ear  canals without cough reflex   NECK :  without JVD/Nodes/TM/ nl carotid upstrokes bilaterally   LUNGS: no acc muscle use,  Nl contour chest / mild insp and exp rhonchi bilaterally    CV:  RRR  no s3 or murmur or increase in P2, no edema   ABD:  soft and nontender with nl inspiratory excursion in the supine position. No bruits or organomegaly, bowel sounds nl  MS:  Nl gait/ ext warm without deformities, calf tenderness, cyanosis or clubbing No obvious joint restrictions   SKIN: warm and dry without lesions    NEURO:  alert, approp, nl sensorium with  no motor deficits    I personally reviewed images and agree with radiology impression as follows:  CXR:  12/12/14  Right middle lobe atelectasis versus airspace disease. Followup PA and lateral chest X-ray is recommended in 3-4 weeks following trial of antibiotic therapy to ensure resolution and exclude underlying malignancy.      Assessment:

## 2015-12-21 DIAGNOSIS — J9611 Chronic respiratory failure with hypoxia: Secondary | ICD-10-CM | POA: Insufficient documentation

## 2015-12-21 DIAGNOSIS — J9612 Chronic respiratory failure with hypercapnia: Secondary | ICD-10-CM

## 2015-12-21 MED ORDER — BUDESONIDE-FORMOTEROL FUMARATE 160-4.5 MCG/ACT IN AERO: INHALATION_SPRAY | RESPIRATORY_TRACT | Status: DC

## 2015-12-21 NOTE — Assessment & Plan Note (Signed)
On 02 3lpm since d/c 11/18/2015 - HCO3  34  12/14/15   Adequate control on present rx, reviewed > no change in rx needed

## 2015-12-21 NOTE — Addendum Note (Signed)
Addended by: Sandrea HughsWERT, Dannell Raczkowski B on: 12/21/2015 08:32 AM   Modules accepted: Orders

## 2015-12-21 NOTE — Assessment & Plan Note (Signed)
Body mass index is 45.46   Lab Results  Component Value Date   TSH 2.564 12/08/2015     Contributing to gerd tendency/ doe/reviewed the need and the process to achieve and maintain neg calorie balance > defer f/u primary care including intermittently monitoring thyroid status

## 2015-12-21 NOTE — Assessment & Plan Note (Signed)
Explained rml changes may take a lot longer to clear radiographically > f/u with cxr in 2 wks/ no further abx

## 2015-12-21 NOTE — Assessment & Plan Note (Addendum)
DDX of  difficult airways management almost all start with A and  include Adherence, Ace Inhibitors, Acid Reflux, Active Sinus Disease, Alpha 1 Antitripsin deficiency, Anxiety masquerading as Airways dz,  ABPA,  Allergy(esp in young), Aspiration (esp in elderly), Adverse effects of meds,  Active smokers, A bunch of PE's (a small clot burden can't cause this syndrome unless there is already severe underlying pulm or vascular dz with poor reserve) plus two Bs  = Bronchiectasis and Beta blocker use..and one C= CHF   Adherence is always the initial "prime suspect" and is a multilayered concern that requires a "trust but verify" approach in every patient - starting with knowing how to use medications, especially inhalers, correctly, keeping up with refills and understanding the fundamental difference between maintenance and prns vs those medications only taken for a very short course and then stopped and not refilled.  - The proper method of use, as well as anticipated side effects, of a metered-dose inhaler are discussed and demonstrated to the patient. Improved effectiveness after extensive coaching during this visit to a level of approximately 75 % from a baseline of 25 % > rx symbicort 160 2bid  ? Acid (or non-acid) GERD > always difficult to exclude as up to 75% of pts in some series report no assoc GI/ Heartburn symptoms> rec max (24h)  acid suppression and diet restrictions/ reviewed and instructions given in writing.   ? BB effects > probably coreg ok at low doses though ideally Strongly prefer in this setting: Bystolic, the most beta -1  selective Beta blocker available in sample form, with bisoprolol the most selective generic choice  on the market.   ? CHF > note only diastolic dysfunction by Echo 11/08/15    I had an extended discussion with the patient reviewing all relevant studies completed to date and  lasting 25 minutes of a 40 minute transition of care  visit    Each maintenance  medication was reviewed in detail including most importantly the difference between maintenance and prns and under what circumstances the prns are to be triggered using an action plan format that is not reflected in the computer generated alphabetically organized AVS.    Please see instructions for details which were reviewed in writing and the patient given a copy highlighting the part that I personally wrote and discussed at today's ov.

## 2015-12-23 ENCOUNTER — Ambulatory Visit: Payer: Self-pay | Attending: Internal Medicine

## 2015-12-26 ENCOUNTER — Encounter: Payer: Self-pay | Admitting: Family Medicine

## 2015-12-26 ENCOUNTER — Ambulatory Visit (INDEPENDENT_AMBULATORY_CARE_PROVIDER_SITE_OTHER): Payer: Self-pay | Admitting: Family Medicine

## 2015-12-26 VITALS — BP 147/76 | HR 83 | Temp 98.2°F | Resp 24 | Ht 69.0 in | Wt 307.0 lb

## 2015-12-26 DIAGNOSIS — E669 Obesity, unspecified: Secondary | ICD-10-CM

## 2015-12-26 DIAGNOSIS — J189 Pneumonia, unspecified organism: Secondary | ICD-10-CM

## 2015-12-26 DIAGNOSIS — R000 Tachycardia, unspecified: Secondary | ICD-10-CM

## 2015-12-26 DIAGNOSIS — I5189 Other ill-defined heart diseases: Secondary | ICD-10-CM

## 2015-12-26 DIAGNOSIS — I519 Heart disease, unspecified: Secondary | ICD-10-CM

## 2015-12-26 DIAGNOSIS — Z9981 Dependence on supplemental oxygen: Secondary | ICD-10-CM

## 2015-12-26 NOTE — Progress Notes (Signed)
Subjective:    Patient ID: Kenneth Casey, male    DOB: 1956/03/30, 60 y.o.   MRN: [id number]  HPI  Mr. Ruven Corradi, a 60 year old male with a history of diastolic dysfunction and obesity presents accompanied by wife for a post hospital follow-up.  Mr. Lall was recently hospitalized with community acquired pneumonia. Mr. Wilmon Pali states that he had a cough for 1 week stay prior to presenting to the emergency department. He was found to have worsening SOB and hypoxia.  He had been improving at home until approximately 3 days prior to going to the emergency department on 12/13/2015. He complained of shortness of breath, coughing, runny nose, and a headache. In the ED, his chest x-ray revealed right middle lobe atelectasis or infiltrate, airspace disease. He had increasing hypoxia, oxygenating 88-90% on his usual 3 L of oxygen. He was admitted for further evaluation and management. He was discharged on a short course of steroids, which he has completed. He currently denies fever, headache, chest pains, or shortness of breath. Patient is on 3 liters of oxygen.  Oxygen is managed by [facility]. Mr. Wilmon Pali had an appointment on 12/20/2015 with Dr. Sherene Sires, pulmonology. Patient's symptoms are currently controlled on Symbicort twice daily. He maintains that he has not required albuterol inhaler. Mr. Wilmon Pali is a former smoker with a long history of tobacco use.   Patient reports that edema to lower extremities has improved since starting furosemide. The edema has been moderate.  Onset of symptoms was several weeks ago, clinical course has been stable since that time.  The swelling has been aggravated by dependency of involved area and increased salt intake, relieved by elevation of involved area. He continues to take Furosemide 40 mg daily with moderate relief.  Past Medical History  Diagnosis Date  . COPD (chronic obstructive pulmonary disease) (HCC)   . Pneumonia   . Obese    Social History   Social  History  . Marital Status: Married    Spouse Name: N/A  . Number of Children: N/A  . Years of Education: N/A   Occupational History  . Not on file.   Social History Main Topics  . Smoking status: Former Smoker -- 2.00 packs/day for 30 years    Types: Cigarettes    Quit date: 01/08/2011  . Smokeless tobacco: Never Used  . Alcohol Use: No  . Drug Use: Not on file  . Sexual Activity: Not on file   Other Topics Concern  . Not on file   Social History Narrative   Immunization History  Administered Date(s) Administered  . Influenza Split 09/10/2015    Review of Systems  Constitutional: Negative for fever, chills and fatigue.  Respiratory: Positive for cough and shortness of breath (with exertion).   Cardiovascular: Negative.  Negative for chest pain, palpitations and leg swelling.  Gastrointestinal: Negative for nausea, vomiting, diarrhea, constipation and rectal pain.  Endocrine: Negative for polydipsia, polyphagia and polyuria.  Genitourinary: Negative.  Negative for difficulty urinating.  Skin: Negative.   Neurological: Negative.  Negative for dizziness and light-headedness.  Hematological: Negative.   Psychiatric/Behavioral: Negative.        Objective:   Physical Exam  Constitutional: He is oriented to person, place, and time.  Morbid obesity  HENT:  Head: Normocephalic and atraumatic.  Right Ear: Hearing, tympanic membrane, external ear and ear canal normal.  Left Ear: Hearing, tympanic membrane, external ear and ear canal normal.  Mouth/Throat: Uvula is midline and oropharynx is clear  and moist.  Eyes: Conjunctivae and EOM are normal. Pupils are equal, round, and reactive to light.  Neck: Normal range of motion. Neck supple.  Cardiovascular: Regular rhythm, S1 normal and S2 normal.  Tachycardia present.   Pulmonary/Chest: Effort normal. No apnea. No respiratory distress. He has no wheezes. He has rhonchi in the right upper field, the right middle field, the left  middle field and the left lower field.  Abdominal: Soft. Bowel sounds are normal.  Increased abdominal girth (pendulous)  Musculoskeletal: Normal range of motion.  Neurological: He is alert and oriented to person, place, and time. No cranial nerve deficit or sensory deficit.  Skin: Skin is warm and dry.  Psychiatric: He has a normal mood and affect. His behavior is normal. Judgment and thought content normal.      BP 147/76 mmHg  Pulse 83  Temp(Src) 98.2 F (36.8 C) (Oral)  Resp 24  Ht 5\' 9"  (1.753 m)  Wt 307 lb (139.254 kg)  BMI 45.32 kg/m2  SpO2 96% Assessment & Plan:  1. HCAP (healthcare-associated pneumonia) Patient presents for a follow up of health care acquired pneumonia. Patient was discharged on a course of steroids and treated with antibiotics. Dr. Sherene SiresWert scheduled a follow-up chest xray in 2 weeks. Patient will not require further antibiotic therapy at this juncture. Also, will continue chronic oxygen therapy per Dr. Sherene SiresWert.    2. On home oxygen therapy Continue oxygen therapy at 3 L.   3. Obesity Recommend a lowfat, low carbohydrate diet divided over 5-6 small meals, increase water intake to 2-3 glasses, and increase daily activity. Patient has lost 7 pounds since previous visit.    4. Diastolic dysfunction Reviewed previous echocardiogram, EF 55-60 %. Echo also showed dialstolic dysfunction. Patient had a BNP of 703 while hospitalized.  Reviewed EKG, sinus tachycardia. Will continue Coreg 6.25 mg BID. Will follow up in 1 month - carvedilol (COREG) 6.25 MG tablet; Take 1 tablet (6.25 mg total) by mouth 2 (two) times daily with a meal.  Dispense: 60 tablet; Refill: 2 - furosemide (LASIX) 40 MG tablet; Take 1 tablet (40 mg total) by mouth daily.  Dispense: 30 tablet; Refill: 2  5. Sinus tachycardia (HCC) Refer to #4    Tymel Conely M, FNP  RTC: 1 month  The patient was given clear instructions to go to ER or return to medical center if symptoms do not improve,  worsen or new problems develop. The patient verbalized understanding..Marland Kitchen

## 2015-12-27 ENCOUNTER — Telehealth: Payer: Self-pay | Admitting: Family Medicine

## 2015-12-27 MED ORDER — FUROSEMIDE 40 MG PO TABS: 40.0000 mg | ORAL_TABLET | Freq: Every day | ORAL | Status: DC

## 2015-12-27 MED ORDER — CARVEDILOL 6.25 MG PO TABS
6.2500 mg | ORAL_TABLET | Freq: Two times a day (BID) | ORAL | Status: DC
Start: 2015-12-27 — End: 2016-01-10

## 2015-12-27 NOTE — Patient Instructions (Signed)
Continue all medications at current dosages Limit work activities to 4 hours per day.  Continue 3 liters of oxygen via nasal cannula

## 2015-12-27 NOTE — Telephone Encounter (Signed)
[place], Please see message from patient below. Do you have any alternative? Please advise. Thanks!

## 2015-12-27 NOTE — Telephone Encounter (Signed)
Patient is having difficulty with Home Health Care. Patient needs an alternative source to get oxygen as he is unable to pay for it through [facility].

## 2016-01-05 ENCOUNTER — Telehealth: Payer: Self-pay | Admitting: Family Medicine

## 2016-01-05 NOTE — Telephone Encounter (Signed)
Received request from Disability Determination Services for Medical Records. Forwarded to [employer].  

## 2016-01-09 ENCOUNTER — Ambulatory Visit: Payer: Self-pay | Admitting: Family Medicine

## 2016-01-10 ENCOUNTER — Telehealth: Payer: Self-pay

## 2016-01-10 DIAGNOSIS — I5189 Other ill-defined heart diseases: Secondary | ICD-10-CM

## 2016-01-10 MED ORDER — CARVEDILOL 6.25 MG PO TABS: 6.2500 mg | ORAL_TABLET | Freq: Two times a day (BID) | ORAL | Status: DC

## 2016-01-10 MED FILL — ?FUROSEMIDE 40 MG TABLET: 40 | 30 days supply | Qty: 30 | Fill #0 | Status: TO

## 2016-01-10 MED FILL — CARVEDILOL 6.25 MG TABLET: 6.25 | 30 days supply | Qty: 60 | Fill #0 | Status: TO

## 2016-01-10 NOTE — Telephone Encounter (Signed)
Refill for carvadiolol sent to pharmacy. Thanks!

## 2016-01-17 ENCOUNTER — Ambulatory Visit (INDEPENDENT_AMBULATORY_CARE_PROVIDER_SITE_OTHER): Payer: Self-pay | Admitting: Internal Medicine

## 2016-01-17 ENCOUNTER — Encounter: Payer: Self-pay | Admitting: Internal Medicine

## 2016-01-17 ENCOUNTER — Ambulatory Visit (INDEPENDENT_AMBULATORY_CARE_PROVIDER_SITE_OTHER)
Admission: RE | Admit: 2016-01-17 | Discharge: 2016-01-17 | Disposition: A | Discharge status: Home / Self Care | Payer: Self-pay | Source: Ambulatory Visit | Attending: Internal Medicine | Admitting: Internal Medicine

## 2016-01-17 VITALS — BP 136/82 | HR 74 | Ht 69.0 in | Wt 311.0 lb

## 2016-01-17 DIAGNOSIS — J189 Pneumonia, unspecified organism: Secondary | ICD-10-CM

## 2016-01-17 DIAGNOSIS — J449 Chronic obstructive pulmonary disease, unspecified: Secondary | ICD-10-CM

## 2016-01-17 DIAGNOSIS — J9611 Chronic respiratory failure with hypoxia: Secondary | ICD-10-CM

## 2016-01-17 DIAGNOSIS — J9612 Chronic respiratory failure with hypercapnia: Secondary | ICD-10-CM

## 2016-01-17 NOTE — Progress Notes (Signed)
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 Recommend follow-up compliance of chronic oxygen therapy.   Discharge Diagnoses:  1. Healthcare associated pneumonia. 2. Acute on chronic respiratory failure with hypoxia. 3. COPD without exacerbation. 4. Chronic diastolic congestive heart failure. Remain compensated. 5. Morbid obesity. 6. Mild normocytic anemia, in part, hemodilutional. 7. Lactic acidosis.  Discharge Condition: Improved.  Diet recommendation: Heart healthy.  Filed Weights   12/13/15 1116 12/13/15 1457  Weight: 141.522 kg (312 lb) 142.883 kg (315 lb)    History of present illness:  The patient is a 60 year old man with history of oxygen dependent COPD. He was recently hospitalized in December for septic shock secondary to community-acquired pneumonia which required intubation. He had been doing fairly well at home until approximately 3 days prior to his presentation to the ED again on 12/12/2014. He complained of shortness of breath, coughing, runny nose, and a headache. In the ED, his chest x-ray revealed right middle lobe atelectasis or infiltrate/airspace disease. His lactic acid was 2.41. He was slightly more hypoxic than usual, oxygenating 88-90% on his usual 3 L of oxygen. He was admitted for further evaluation and management.  Hospital Course:   1. Healthcare associated pneumonia. The patient was recently hospitalized in December after a prolonged stay with ventilator dependent respiratory failure secondary to septic shock, pneumonia, and hypoxic respiratory failure. He  returned on 12/13/15 with shortness of breath and cough. Chest x-ray in the ED revealed right middle lobe atelectasis versus airspace disease. ABG on 2 L of oxygen on admission revealed a pH of 7.4, PCO2 of 46, and PO2 of 67. Cefepime and vancomycin were started. He was continued on oxygen. -Influenza was negative. HIV was nonreactive. Blood cultures were negative to date. Urine Legionella antigen was pending at the time of discharge. Strep will antigen was negative. -Patient improved clinically and symptomatically. His oxygen saturations improved progressively. He did not have significant wheezing, however, at the time of discharge, it was felt that a few days of prednisone would be helpful. Therefore, he was discharged on 3 more days of prednisone along with 5 more days of Ceftin for ongoing treatment. He was also given a prescription for albuterol nebulizer to be used 3 times a day for 5 more days and then every 4-6 hours thereafter. He was encouraged to wear his oxygen more consistently at home.  Acute on chronic respiratory failure with hypoxia. Patient is treated with 2-3 L of nasal cannula oxygen. His oxygen saturations were lower than his normal range on admission. Oxygen was titrated to keep his oxygen saturations 90% or greater. His wife stated that the patient did not wear his oxygen 24/7. Patient was encouraged to wear his oxygen close 24 hours a day 7 days a week as possible. He voiced understanding.  Chronic oxygen dependent COPD. He was continued on Symbicort. There was no significant wheezing on admission, but he did have occasional wheezes prior to discharge, therefore, he was discharged on a very short prednisone taper.   Chronic diastolic heart failure. His heart failure was compensated.  He was continued on Lasix.       12/21/2015 1st  Pulmonary office visit/ [name]   Chief Complaint  Patient presents with  . HFU    Pt states that his breathing has improved some, but not  baseline for him. He c/o chest congestion and has had some cough with clear sputum. He has been wheezing some. He is using albuterol inhaler 4-5 x per day and neb 2-4 x per day.   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  Plan B= Backup Only use your albuterol as a rescue medication to  Plan C = crisis Only use nebulizer if you try the ventolin first and it doesn't work  Try prilosec otc   Take 30-60 min before first meal of the day and Pepcid ac (famotidine) 20 mg one @  bedtime until cough is completely gone for at least a week without the need for cough suppression GERD diet      01/17/2016  f/u ov/[name] re:  GOLD III copd/ maint on symbicort 160 when he can afford it  Chief Complaint  Patient presents with  . Follow-up    Breathing continues to improve. He is coughing less. Sometimes prod in the am's with clear to yellow sputum.   doe = MMRC1 = can walk nl pace, flat grade, can't hurry or go uphills or steps s sob   Rare need for saba / almost no neb use at all/ main challenge is paying for care   No obvious day to day or daytime variability  or cp or chest tightness, subjective wheeze or overt sinus or hb symptoms. No unusual exp hx or h/o childhood pna/ asthma or knowledge of premature birth.  Sleeping ok without nocturnal  or early am exacerbation  of respiratory  c/o's or need for noct saba. Also denies any obvious fluctuation of symptoms with weather or environmental changes or other aggravating or alleviating factors except as outlined above   Current Medications, Allergies, Complete Past Medical History, Past Surgical History, Family History, and Social History were reviewed in [employer] record.  ROS   The following are not active complaints unless bolded sore throat, dysphagia, dental problems, itching, sneezing,  nasal congestion or excess/ purulent secretions, ear ache,   fever, chills, sweats, unintended wt loss, classically pleuritic or exertional cp, hemoptysis,  orthopnea pnd or leg swelling, presyncope, palpitations, abdominal pain, anorexia, nausea, vomiting, diarrhea  or change in bowel or bladder habits, change in stools or urine, dysuria,hematuria,  rash, arthralgias, visual complaints, headache, numbness, weakness or ataxia or problems with walking or coordination,  change in mood/affect or memory.           Objective:   Physical Exam    obese amb wm congested sounding cough   01/17/2016           311   12/20/15 308 lb (139.708 kg)  12/13/15 315 lb (142.883 kg)  12/08/15 312 lb (141.522 kg)    Vital signs reviewed    HEENT: nl dentition, turbinates, and oropharynx. Nl external ear canals without cough reflex   NECK :  without JVD/Nodes/TM/ nl carotid upstrokes bilaterally  LUNGS: no acc muscle use,  Nl contour chest / min  insp and exp rhonchi bilaterally    CV:  RRR  no s3 or murmur or increase in P2, no edema   ABD:  soft and nontender with nl inspiratory excursion in the supine position. No bruits or organomegaly, bowel sounds nl  MS:  Nl gait/ ext warm without deformities, calf tenderness, cyanosis or clubbing No obvious joint restrictions   SKIN: warm and dry without lesions    NEURO:  alert, approp, nl sensorium with  no motor deficits    CXR PA and Lateral:   01/17/2016 :    I personally reviewed images and agree with radiology impression as follows:    The lungs remain clear but hyperaerated consistent with and element of emphysema with flattened hemidiaphragms. Mediastinal and hilar contours are unremarkable. Heart size is stable being mildly enlarged. No acute bony abnormality is seen.      Assessment:

## 2016-01-17 NOTE — Patient Instructions (Addendum)
Please remember to go to the  x-ray department downstairs for your tests - we will call you with the results when they are available.  No need for 02 at rest just use at bedtime and with activity   Please schedule a follow up visit in 3 months but call sooner if needed

## 2016-01-18 ENCOUNTER — Encounter: Payer: Self-pay | Admitting: Internal Medicine

## 2016-01-18 NOTE — Assessment & Plan Note (Signed)
Resolved on cxr 01/17/2016

## 2016-01-18 NOTE — Assessment & Plan Note (Signed)
On 02 3lpm since d/c 11/18/2015 - HCO3  34  12/14/15  - 01/17/2016 sats mid 90's RA at rest so rec 2lpm sleeping / exerting but none needed at rest

## 2016-01-18 NOTE — Assessment & Plan Note (Signed)
12/20/2015  extensive coaching HFA effectiveness =    75% > continue symbicort 160 2bid - Spirometry 01/17/2016  FEV1 1.62 (44%)  Ratio 61   Also has sign restriction from obesity/ nothing else to offer for the obst component since can't afford the meds he's already on  Will try to get him assistance from [place]  I had an extended discussion with the patient reviewing all relevant studies completed to date and  lasting 10 minutes of a 15 minute visit    Each maintenance medication was reviewed in detail including most importantly the difference between maintenance and prns and under what circumstances the prns are to be triggered using an action plan format that is not reflected in the computer generated alphabetically organized AVS.    Please see instructions for details which were reviewed in writing and the patient given a copy highlighting the part that I personally wrote and discussed at today's ov.

## 2016-01-20 ENCOUNTER — Telehealth: Payer: Self-pay | Admitting: Internal Medicine

## 2016-01-20 NOTE — Telephone Encounter (Signed)
I spoke with patient about results and he verbalized understanding and had no questions 

## 2016-01-20 NOTE — Telephone Encounter (Signed)
No results on cxr from 01/17/16. Please advise MW, pt calling for results. thanks

## 2016-01-20 NOTE — Telephone Encounter (Signed)
Sorry must have hit the wrong button It shows no acute abnormalities

## 2016-01-25 ENCOUNTER — Telehealth: Payer: Self-pay | Admitting: Family Medicine

## 2016-01-25 ENCOUNTER — Other Ambulatory Visit: Payer: Self-pay | Admitting: Family Medicine

## 2016-01-25 DIAGNOSIS — J449 Chronic obstructive pulmonary disease, unspecified: Secondary | ICD-10-CM

## 2016-01-25 DIAGNOSIS — Z9981 Dependence on supplemental oxygen: Secondary | ICD-10-CM

## 2016-01-25 NOTE — Telephone Encounter (Signed)
Patient needs RX sent to [facility] for Oxygen, attn: Marliss Czar. Patient states the prices are more cost effective than [facility].

## 2016-01-25 NOTE — Telephone Encounter (Signed)
[place], can we send in order for oxygen? Please advise. Thanks! LOV 12/26/2015.

## 2016-02-03 ENCOUNTER — Encounter: Payer: Self-pay | Admitting: Family Medicine

## 2016-02-03 ENCOUNTER — Ambulatory Visit (INDEPENDENT_AMBULATORY_CARE_PROVIDER_SITE_OTHER): Payer: Self-pay | Admitting: Family Medicine

## 2016-02-03 VITALS — BP 135/66 | HR 95 | Temp 98.1°F | Resp 18 | Ht 69.0 in | Wt 308.0 lb

## 2016-02-03 DIAGNOSIS — J189 Pneumonia, unspecified organism: Secondary | ICD-10-CM

## 2016-02-03 DIAGNOSIS — I5032 Chronic diastolic (congestive) heart failure: Secondary | ICD-10-CM

## 2016-02-03 DIAGNOSIS — J449 Chronic obstructive pulmonary disease, unspecified: Secondary | ICD-10-CM

## 2016-02-03 DIAGNOSIS — M25562 Pain in left knee: Secondary | ICD-10-CM

## 2016-02-03 DIAGNOSIS — G5792 Unspecified mononeuropathy of left lower limb: Secondary | ICD-10-CM

## 2016-02-03 DIAGNOSIS — F411 Generalized anxiety disorder: Secondary | ICD-10-CM

## 2016-02-03 DIAGNOSIS — Z9981 Dependence on supplemental oxygen: Secondary | ICD-10-CM

## 2016-02-03 LAB — POCT URINALYSIS DIP (DEVICE)
BILIRUBIN URINE: NEGATIVE
Glucose, UA: NEGATIVE mg/dL
Hgb urine dipstick: NEGATIVE
Leukocytes, UA: NEGATIVE
NITRITE: NEGATIVE
PROTEIN: NEGATIVE mg/dL
Specific Gravity, Urine: 1.02 (ref 1.005–1.030)
Urobilinogen, UA: 0.2 mg/dL (ref 0.0–1.0)
pH: 7 (ref 5.0–8.0)

## 2016-02-03 LAB — BASIC METABOLIC PANEL WITH GFR
BUN: 15 mg/dL (ref 7–25)
CALCIUM: 9.3 mg/dL (ref 8.6–10.3)
CO2: 28 mmol/L (ref 20–31)
Chloride: 100 mmol/L (ref 98–110)
Creat: 0.95 mg/dL (ref 0.70–1.33)
GFR, EST NON AFRICAN AMERICAN: 88 mL/min (ref >=60)
Glucose, Bld: 93 mg/dL (ref 65–99)
POTASSIUM: 3.9 mmol/L (ref 3.5–5.3)
Sodium: 140 mmol/L (ref 135–146)

## 2016-02-03 MED ORDER — ALPRAZOLAM 0.25 MG PO TABS: 0.2500 mg | ORAL_TABLET | Freq: Every day | ORAL | Status: DC

## 2016-02-03 MED ORDER — GABAPENTIN 100 MG PO CAPS: 100.0000 mg | ORAL_CAPSULE | Freq: Every day | ORAL | Status: DC

## 2016-02-03 NOTE — Progress Notes (Signed)
Subjective:    Patient ID: Kenneth Casey, male    DOB: 09-08-1956, 60 y.o.   MRN: [id number]  HPI  Kenneth Casey, a 60 year old male with a history of diastolic dysfunction, COPD,  and obesity presents accompanied by wife for a 1 month follow up.  Kenneth Casey states that symptoms have stabilized on current medication regimen.  He has continued to utilized home oxygen at 3 liters. He endorses dyspnea with exertion and air hunger when lying flat. He reports that he experiences anxiety primarily at night whenever he experiences shortness of breath. He reports periodic insomnia.  Kenneth Casey continues to follow with Dr. Sherene Sires, pulmonology. Patient's symptoms are currently controlled on Symbicort twice daily. He maintains that he has not required albuterol inhaler. Kenneth Casey is a former smoker with a long history of tobacco use.   Patient reports that edema to lower extremities has improved since starting furosemide. The edema has been moderate.  Onset of symptoms was several weeks ago, clinical course has been stable since that time.  The swelling has been aggravated by dependency of involved area and increased salt intake, relieved by elevation of involved area. He continues to take Furosemide 40 mg daily with moderate relief.  He is complaining of left leg pain. Pain is described as intermittent, burning, and shooting. Pain often intensifies with prolonged standing, pivoting, and walking for long distances. He has not attempted any OTC interventions to alleviate pain. He denies injury, fever, weakness, or swelling to left knee.  Past Medical History  Diagnosis Date  . COPD (chronic obstructive pulmonary disease) (HCC)   . Pneumonia   . Obese    Social History   Social History  . Marital Status: Married    Spouse Name: N/A  . Number of Children: N/A  . Years of Education: N/A   Occupational History  . Not on file.   Social History Main Topics  . Smoking status: Former Smoker -- 2.00  packs/day for 30 years    Types: Cigarettes    Quit date: 01/08/2011  . Smokeless tobacco: Never Used  . Alcohol Use: No  . Drug Use: Not on file  . Sexual Activity: Not on file   Other Topics Concern  . Not on file   Social History Narrative   Immunization History  Administered Date(s) Administered  . Influenza Split 09/10/2015    Review of Systems  Constitutional: Negative for fever, chills and fatigue.  Respiratory: Positive for cough and shortness of breath (with exertion).   Cardiovascular: Negative.  Negative for chest pain, palpitations and leg swelling.  Gastrointestinal: Negative for nausea, vomiting, diarrhea, constipation and rectal pain.  Endocrine: Negative for polydipsia, polyphagia and polyuria.  Genitourinary: Negative.  Negative for difficulty urinating.  Skin: Negative.   Neurological: Negative.  Negative for dizziness and light-headedness.  Hematological: Negative.   Psychiatric/Behavioral: Negative.        Objective:   Physical Exam  Constitutional: He is oriented to person, place, and time.  Morbid obesity  HENT:  Head: Normocephalic and atraumatic.  Right Ear: Hearing, tympanic membrane, external ear and ear canal normal.  Left Ear: Hearing, tympanic membrane, external ear and ear canal normal.  Mouth/Throat: Uvula is midline and oropharynx is clear and moist.  Eyes: Conjunctivae and EOM are normal. Pupils are equal, round, and reactive to light.  Neck: Normal range of motion. Neck supple.  Cardiovascular: Regular rhythm, S1 normal, S2 normal, normal heart sounds and intact distal pulses.  Tachycardia  present.   Pulmonary/Chest: Effort normal. No apnea. No respiratory distress. He has no wheezes. He has rhonchi in the right upper field, the right middle field, the left middle field and the left lower field.  Abdominal: Soft. Bowel sounds are normal.  Increased abdominal girth (pendulous)  Musculoskeletal:       Left knee: He exhibits decreased  range of motion and abnormal patellar mobility. He exhibits no swelling and no erythema. Tenderness found.  Neurological: He is alert and oriented to person, place, and time. No cranial nerve deficit or sensory deficit.  Skin: Skin is warm and dry.  Psychiatric: He has a normal mood and affect. His behavior is normal. Judgment and thought content normal.      BP 135/66 mmHg  Pulse 95  Temp(Src) 98.1 F (36.7 C) (Oral)  Resp 18  Ht  (1.753 m)  Wt 308 lb (139.708 kg)  BMI 45.46 kg/m2  SpO2 97% Assessment & Plan:  1. Neuropathy of left lower extremity Left knee pain is described as burning and stabbing at times. I will start a trial of gabapentin 100 mg HS. Will follow up in office in 1 month.  - gabapentin (NEURONTIN) 100 MG capsule; Take 1 capsule (100 mg total) by mouth at bedtime.  Dispense: 30 capsule; Refill: 0  2. Anxiety state Will start a trial of alprazolam 0.25 mg prn HS for feelings of anxiety and COPD exacerbation.  - ALPRAZolam (XANAX) 0.25 MG tablet; Take 1 tablet (0.25 mg total) by mouth at bedtime.  Dispense: 20 tablet; Refill: 0  3. COPD GOLD III Will continue Symbicort BID . Follow up with Dr. Sherene Sires as scheduled.   4. On home oxygen therapy Will continue home oxygen at 3 liters.   5. Chronic diastolic CHF (congestive heart failure) (HCC) Patient will continue furosemide and carvedilol as previously prescribed. Will check potassium level on today. Reviewed previous echocardiogram, EF 55-60 %. Echo also showed dialstolic dysfunction. .  - BASIC METABOLIC PANEL WITH GFR  6. Left knee pain Patient is complaining of increased pain to left knee that is worsened with weight bearing. Pain is intensified by prolonged standing, walking, and pivoting movements. Patient may warrant an MRI of left knee. He states that he would like to hold off. Recommend applying warm, moist compresses to left knee.   RTC: 1 month for a follow up of neuropathy  The patient was given  clear instructions to go to ER or return to medical center if symptoms do not improve, worsen or new problems develop. The patient verbalized understanding.Massie Maroon, FNP

## 2016-02-03 NOTE — Patient Instructions (Addendum)
Will start a trial of gabapentin 100 at night for left knee discomfort Will also start a trial of Xanax 0.25 mg for anxiety and insomnia related to dyspnea Will follow up in 1 month.  Will follow up by phone with laboratory results   Generalized Anxiety Disorder Generalized anxiety disorder (GAD) is a mental disorder. It interferes with life functions, including relationships, work, and school. GAD is different from normal anxiety, which everyone experiences at some point in their lives in response to specific life events and activities. Normal anxiety actually helps [place] prepare for and get through these life events and activities. Normal anxiety goes away after the event or activity is over.  GAD causes anxiety that is not necessarily related to specific events or activities. It also causes excess anxiety in proportion to specific events or activities. The anxiety associated with GAD is also difficult to control. GAD can vary from mild to severe. People with severe GAD can have intense waves of anxiety with physical symptoms (panic attacks).  SYMPTOMS The anxiety and worry associated with GAD are difficult to control. This anxiety and worry are related to many life events and activities and also occur more days than not for 6 months or longer. People with GAD also have three or more of the following symptoms (one or more in children):  Restlessness.   Fatigue.  Difficulty concentrating.   Irritability.  Muscle tension.  Difficulty sleeping or unsatisfying sleep. DIAGNOSIS GAD is diagnosed through an assessment by your health care provider. Your health care provider will ask you questions aboutyour mood,physical symptoms, and events in your life. Your health care provider may ask you about your medical history and use of alcohol or drugs, including prescription medicines. Your health care provider may also do a physical exam and blood tests. Certain medical conditions and the use of  certain substances can cause symptoms similar to those associated with GAD. Your health care provider may refer you to a mental health specialist for further evaluation. TREATMENT The following therapies are usually used to treat GAD:   Medication. Antidepressant medication usually is prescribed for long-term daily control. Antianxiety medicines may be added in severe cases, especially when panic attacks occur.   Talk therapy (psychotherapy). Certain types of talk therapy can be helpful in treating GAD by providing support, education, and guidance. A form of talk therapy called cognitive behavioral therapy can teach you healthy ways to think about and react to daily life events and activities.  Stress managementtechniques. These include yoga, meditation, and exercise and can be very helpful when they are practiced regularly. A mental health specialist can help determine which treatment is best for you. Some people see improvement with one therapy. However, other people require a combination of therapies.   This information is not intended to replace advice given to you by your health care provider. Make sure you discuss any questions you have with your health care provider.   Document Released: 03/23/2013 Document Revised: 12/17/2014 Document Reviewed: 03/23/2013 Elsevier Interactive Patient Education 2016 [employer] Insomnia Insomnia is a sleep disorder that makes it difficult to fall asleep or to stay asleep. Insomnia can cause tiredness (fatigue), low energy, difficulty concentrating, mood swings, and poor performance at work or school.  There are three different ways to classify insomnia:  Difficulty falling asleep.  Difficulty staying asleep.  Waking up too early in the morning. Any type of insomnia can be long-term (chronic) or short-term (acute). Both are common. Short-term insomnia usually lasts for  three months or less. Chronic insomnia occurs at least three times a week for  longer than three months. CAUSES  Insomnia may be caused by another condition, situation, or substance, such as:  Anxiety.  Certain medicines.  Gastroesophageal reflux disease (GERD) or other gastrointestinal conditions.  Asthma or other breathing conditions.  Restless legs syndrome, sleep apnea, or other sleep disorders.  Chronic pain.  Menopause. This may include hot flashes.  Stroke.  Abuse of alcohol, tobacco, or illegal drugs.  Depression.  Caffeine.   Neurological disorders, such as Alzheimer disease.  An overactive thyroid (hyperthyroidism). The cause of insomnia may not be known. RISK FACTORS Risk factors for insomnia include:  Gender. Women are more commonly affected than men.  Age. Insomnia is more common as you get older.  Stress. This may involve your professional or personal life.  Income. Insomnia is more common in people with lower income.  Lack of exercise.   Irregular work schedule or night shifts.  Traveling between different time zones. SIGNS AND SYMPTOMS If you have insomnia, trouble falling asleep or trouble staying asleep is the main symptom. This may lead to other symptoms, such as:  Feeling fatigued.  Feeling nervous about going to sleep.  Not feeling rested in the morning.  Having trouble concentrating.  Feeling irritable, anxious, or depressed. TREATMENT  Treatment for insomnia depends on the cause. If your insomnia is caused by an underlying condition, treatment will focus on addressing the condition. Treatment may also include:   Medicines to help you sleep.  Counseling or therapy.  Lifestyle adjustments. HOME CARE INSTRUCTIONS   Take medicines only as directed by your health care provider.  Keep regular sleeping and waking hours. Avoid naps.  Keep a sleep diary to help you and your health care provider figure out what could be causing your insomnia. Include:   When you sleep.  When you wake up during the  night.  How well you sleep.   How rested you feel the next day.  Any side effects of medicines you are taking.  What you eat and drink.   Make your bedroom a comfortable place where it is easy to fall asleep:  Put up shades or special blackout curtains to block light from outside.  Use a white noise machine to block noise.  Keep the temperature cool.   Exercise regularly as directed by your health care provider. Avoid exercising right before bedtime.  Use relaxation techniques to manage stress. Ask your health care provider to suggest some techniques that may work well for you. These may include:  Breathing exercises.  Routines to release muscle tension.  Visualizing peaceful scenes.  Cut back on alcohol, caffeinated beverages, and cigarettes, especially close to bedtime. These can disrupt your sleep.  Do not overeat or eat spicy foods right before bedtime. This can lead to digestive discomfort that can make it hard for you to sleep.  Limit screen use before bedtime. This includes:  Watching TV.  Using your smartphone, tablet, and computer.  Stick to a routine. This can help you fall asleep faster. Try to do a quiet activity, brush your teeth, and go to bed at the same time each night.  Get out of bed if you are still awake after 15 minutes of trying to sleep. Keep the lights down, but try reading or doing a quiet activity. When you feel sleepy, go back to bed.  Make sure that you drive carefully. Avoid driving if you feel very sleepy.  Keep  all follow-up appointments as directed by your health care provider. This is important. SEEK MEDICAL CARE IF:   You are tired throughout the day or have trouble in your daily routine due to sleepiness.  You continue to have sleep problems or your sleep problems get worse. SEEK IMMEDIATE MEDICAL CARE IF:   You have serious thoughts about hurting yourself or someone else.   This information is not intended to replace advice  given to you by your health care provider. Make sure you discuss any questions you have with your health care provider.   Document Released: 11/23/2000 Document Revised: 08/17/2015 Document Reviewed: 08/27/2014 Elsevier Interactive Patient Education [employer]

## 2016-02-06 ENCOUNTER — Telehealth: Payer: Self-pay

## 2016-02-06 NOTE — Telephone Encounter (Signed)
Called and spoke with patient, advised of normal labs and to continue to take medications as prescribed. Thanks!

## 2016-02-06 NOTE — Telephone Encounter (Signed)
-----   Message from Massie Maroon, [place] sent at 02/04/2016 10:20 AM EST ----- Regarding: lab results Please inform [name] that potassium level is within a normal range. Please inform patient that he can continue medication regimen at current dosage.  Thanks ----- Message -----    From: [facility]    Sent: 02/03/2016   4:20 PM      To: Massie Maroon, FNP

## 2016-02-16 ENCOUNTER — Telehealth: Payer: Self-pay | Admitting: *Deleted

## 2016-02-16 NOTE — Telephone Encounter (Signed)
Patient called saying he is only taking 1/2 tablet and it is not helping. He states he takes it and goes to bed at 10pm and is up by 12am every night. He is asking if he can take a whole tablet? Please advise. Thanks!

## 2016-02-16 NOTE — Telephone Encounter (Signed)
Pt called and stated that Xanax is not working for him. He is requesting call back from nurse

## 2016-03-02 ENCOUNTER — Ambulatory Visit: Payer: Self-pay | Admitting: Family Medicine

## 2016-03-06 ENCOUNTER — Encounter: Payer: Self-pay | Admitting: Family Medicine

## 2016-03-06 ENCOUNTER — Ambulatory Visit (INDEPENDENT_AMBULATORY_CARE_PROVIDER_SITE_OTHER): Payer: Self-pay | Admitting: Family Medicine

## 2016-03-06 VITALS — BP 127/62 | HR 83 | Temp 98.2°F | Resp 18 | Ht 69.0 in | Wt 311.0 lb

## 2016-03-06 DIAGNOSIS — Z9981 Dependence on supplemental oxygen: Secondary | ICD-10-CM

## 2016-03-06 DIAGNOSIS — F411 Generalized anxiety disorder: Secondary | ICD-10-CM

## 2016-03-06 DIAGNOSIS — J449 Chronic obstructive pulmonary disease, unspecified: Secondary | ICD-10-CM

## 2016-03-06 DIAGNOSIS — R062 Wheezing: Secondary | ICD-10-CM

## 2016-03-06 DIAGNOSIS — G5792 Unspecified mononeuropathy of left lower limb: Secondary | ICD-10-CM

## 2016-03-06 MED ORDER — IPRATROPIUM-ALBUTEROL 0.5-2.5 (3) MG/3ML IN SOLN
3.0000 mL | Freq: Once | RESPIRATORY_TRACT | Status: AC
  Administered 2016-03-06: 3 mL via RESPIRATORY_TRACT

## 2016-03-06 MED ORDER — GABAPENTIN 300 MG PO CAPS: 300.0000 mg | ORAL_CAPSULE | Freq: Every day | ORAL | Status: DC

## 2016-03-06 MED ORDER — ALPRAZOLAM 0.25 MG PO TABS: 0.2500 mg | ORAL_TABLET | Freq: Every day | ORAL | Status: DC

## 2016-03-06 MED FILL — GABAPENTIN 300 MG CAPSULE: 300 | 30 days supply | Qty: 30 | Fill #0

## 2016-03-06 NOTE — Patient Instructions (Signed)
Neuropathic Pain Neuropathic pain is pain caused by damage to the nerves that are responsible for certain sensations in your body (sensory nerves). The pain can be caused by damage to:   The sensory nerves that send signals to your spinal cord and brain (peripheral nervous system).  The sensory nerves in your brain or spinal cord (central nervous system). Neuropathic pain can make you more sensitive to pain. What would be a minor sensation for most people may feel very painful if you have neuropathic pain. This is usually a long-term condition that can be difficult to treat. The type of pain can differ from person to person. It may start suddenly (acute), or it may develop slowly and last for a long time (chronic). Neuropathic pain may come and go as damaged nerves heal or may stay at the same level for years. It often causes emotional distress, loss of sleep, and a lower quality of life. CAUSES  The most common cause of damage to a sensory nerve is diabetes. Many other diseases and conditions can also cause neuropathic pain. Causes of neuropathic pain can be classified as:  Toxic. Many drugs and chemicals can cause toxic damage. The most common cause of toxic neuropathic pain is damage from drug treatment for cancer (chemotherapy).  Metabolic. This type of pain can happen when a disease causes imbalances that damage nerves. Diabetes is the most common of these diseases. Vitamin B deficiency caused by long-term alcohol abuse is another common cause.  Traumatic. Any injury that cuts, crushes, or stretches a nerve can cause damage and pain. A common example is feeling pain after losing an arm or leg (phantom limb pain).  Compression-related. If a sensory nerve gets trapped or compressed for a long period of time, the blood supply to the nerve can be cut off.  Vascular. Many blood vessel diseases can cause neuropathic pain by decreasing blood supply and oxygen to nerves.  Autoimmune. This type of  pain results from diseases in which the body's defense system mistakenly attacks sensory nerves. Examples of autoimmune diseases that can cause neuropathic pain include lupus and multiple sclerosis.  Infectious. Many types of viral infections can damage sensory nerves and cause pain. Shingles infection is a common cause of this type of pain.  Inherited. Neuropathic pain can be a symptom of many diseases that are passed down through families (genetic). SIGNS AND SYMPTOMS  The main symptom is pain. Neuropathic pain is often described as:  Burning.  Shock-like.  Stinging.  Hot or cold.  Itching. DIAGNOSIS  No single test can diagnose neuropathic pain. Your health care provider will do a physical exam and ask you about your pain. You may use a pain scale to describe how bad your pain is. You may also have tests to see if you have a high sensitivity to pain and to help find the cause and location of any sensory nerve damage. These tests may include:  Imaging studies, such as:  X-rays.  CT scan.  MRI.  Nerve conduction studies to test how well nerve signals travel through your sensory nerves (electrodiagnostic testing).  Stimulating your sensory nerves through electrodes on your skin and measuring the response in your spinal cord and brain (somatosensory evoked potentials). TREATMENT  Treatment for neuropathic pain may change over time. You may need to try different treatment options or a combination of treatments. Some options include:  Over-the-counter pain relievers.  Prescription medicines. Some medicines used to treat other conditions may also help neuropathic pain. These   include medicines to:  Control seizures (anticonvulsants).  Relieve depression (antidepressants).  Prescription-strength pain relievers (narcotics). These are usually used when other pain relievers do not help.  Transcutaneous nerve stimulation (TENS). This uses electrical currents to block painful nerve  signals. The treatment is painless.  Topical and local anesthetics. These are medicines that numb the nerves. They can be injected as a nerve block or applied to the skin.  Alternative treatments, such as:  Acupuncture.  Meditation.  Massage.  Physical therapy.  Pain management programs.  Counseling. HOME CARE INSTRUCTIONS  Learn as much as you can about your condition.  Take medicines only as directed by your health care provider.  Work closely with all your health care providers to find what works best for you.  Have a good support system at home.  Consider joining a chronic pain support group. SEEK MEDICAL CARE IF:  Your pain treatments are not helping.  You are having side effects from your medicines.  You are struggling with fatigue, mood changes, depression, or anxiety.   This information is not intended to replace advice given to you by your health care provider. Make sure you discuss any questions you have with your health care provider.   Document Released: 08/23/2004 Document Revised: 12/17/2014 Document Reviewed: 05/06/2014 Elsevier Interactive Patient Education 2016 [employer] Inc[employer] Oxygen Use at Home Oxygen can be prescribed for home use. The prescription will show the flow rate. This is how much oxygen is to be used per minute. This will be listed in liters per minute (LPM or L/M). A liter is a metric measurement of volume. You will use oxygen therapy as directed. It can be used while exercising, sleeping, or at rest. You may need oxygen continuously. Your health care provider may order a blood oxygen test (arterial blood gas or pulse oximetry test) that will show what your oxygen level is. Your health care provider will use these measurements to learn about your needs and follow your progress. Home oxygen therapy is commonly used on patients with various lung (pulmonary) related conditions. Some of these conditions include:  Asthma.  Lung  cancer.  Pneumonia.  Emphysema.  Chronic bronchitis.  Cystic fibrosis.  Other lung diseases.  Pulmonary fibrosis.  Occupational lung disease.  Heart failure.  Chronic obstructive pulmonary disease (COPD). 3 COMMON WAYS OF PROVIDING OXYGEN THERAPY  Gas: The gas form of oxygen is put into variously sized cylinders or tanks. The cylinders or oxygen tanks contain compressed oxygen. The cylinder is equipped with a regulator that controls the flow rate. Because the flow of oxygen out of the cylinder is constant, an oxygen conserving device may be attached to the system to avoid waste. This device releases the gas only when you inhale and cuts it off when you exhale. Oxygen can be provided in a small cylinder that can be carried with you. Large tanks are heavy and are only for stationary use. After use, empty tanks must be exchanged for full tanks.  Liquid: The liquid form of oxygen is put into a container similar to a thermos. When released, the liquid converts to a gas and you breathe it in just like the compressed gas. This storage method takes up less space than the compressed gas cylinder, and you can transfer the liquid to a small, portable vessel at home. Liquid oxygen is more expensive than the compressed gas, and the vessel vents when not in use. An oxygen conserving device may be built into the vessel to conserve the oxygen.  Liquid oxygen is very cold, around 297 below zero.  Oxygen concentrator: This medical device filters oxygen from room air and gives almost 100% oxygen to the patient. Oxygen concentrators are powered by electricity. Benefits of this system are:  It does not need to be resupplied.  It is not as costly as liquid oxygen.  Extra tubing permits the user to move around easier. There are several types of small, portable oxygen systems available which can help you remain active and mobile. You must have a cylinder of oxygen as a backup in the event of a power failure.  Advise your electric power company that you are on oxygen therapy in order to get priority service when there is a power failure. OXYGEN DELIVERY DEVICES There are 3 common ways to deliver oxygen to your body.  Nasal cannula. This is a 2-pronged device inserted in the nostrils that is connected to tubing carrying the oxygen. The tubing can rest on the ears or be attached to the frame of eyeglasses.  Mask. People who need a high flow of oxygen generally use a mask.  Transtracheal catheter. Transtracheal oxygen therapy requires the insertion of a small, flexible tube (catheter) in the windpipe (trachea). This catheter is held in place by a necklace. Since transtracheal oxygen bypasses the mouth, nose, and throat, a humidifier is absolutely required at flow rates of 1 LPM or greater. OXYGEN USE SAFETY TIPS  Never smoke while using oxygen. Oxygen does not burn or explode, but flammable materials will burn faster in the presence of oxygen.  Keep a Government social research officer close by. Let your fire department know that you have oxygen in your home.  Warn visitors not to smoke near you when you are using oxygen. Put up "no smoking" signs in your home where you most often use the oxygen.  When you go to a restaurant with your portable oxygen source, ask to be seated in the nonsmoking section.  Stay at least 5 feet away from gas stoves, candles, lighted fireplaces, or other heat sources.  Do not use materials that burn easily (flammable) while using your oxygen.  If you use an oxygen cylinder, make sure it is secured to some fixed object or in a stand. If you use liquid oxygen, make sure the vessel is kept upright to keep the oxygen from pouring out. Liquid oxygen is so cold it can hurt your skin.  If you use an oxygen concentrator, call your electric company so you will be given priority service if your power goes out. Avoid using extension cords, if possible.  Regularly test your smoke detectors at home  to make sure they work. If you receive care in your home from a nurse or other health care provider, he or she may also check to make sure your smoke detectors work. GUIDELINES FOR CLEANING YOUR EQUIPMENT  Wash the nasal prongs with a liquid soap. Thoroughly rinse them once or twice a week.  Replace the prongs every 2 to 4 weeks. If you have an infection (cold, pneumonia) change them when you are well.  Your health care provider will give you instructions on how to clean your transtracheal catheter.  The humidifier bottle should be washed with soap and warm water and rinsed thoroughly between each refill. Air-dry the bottle before filling it with sterile or distilled water. The bottle and its top should be disinfected after they are cleaned.  If you use an oxygen concentrator, unplug the unit. Then wipe down the cabinet with a  damp cloth and dry it daily. The air filter should be cleaned at least twice a week.  Follow your home medical equipment and service company's directions for cleaning the compressor filter. HOME CARE INSTRUCTIONS   Do not change the flow of oxygen unless directed by your health care provider.  Do not use alcohol or other sedating drugs unless instructed. They slow your breathing rate.  Do not use materials that burn easily (flammable) while using your oxygen.  Always keep a spare tank of oxygen. Plan ahead for holidays when you may not be able to get a prescription filled.  Use water-based lubricants on your lips or nostrils. Do not use an oil-based product like petroleum jelly.  To prevent your cheeks or the skin behind your ears from becoming irritated, tuck some gauze under the tubing.  If you have persistent redness under your nose, call your health care provider.  When you no longer need oxygen, your doctor will have the oxygen discontinued. Oxygen is not addicting or habit forming.  Use the oxygen as instructed. Too much oxygen can be harmful and too  little will not give you the benefit you need.  Shortness of breath is not always from a lack of oxygen. If your oxygen level is not the cause of your shortness of breath, taking oxygen will not help. SEEK MEDICAL CARE IF:   You have frequent headaches.  You have shortness of breath or a lasting cough.  You have anxiety.  You are confused.  You are drowsy or sleepy all the time.  You develop an illness which aggravates your breathing.  You cannot exercise.  You are restless.  You have blue lips or fingernails.  You have difficult or irregular breathing and it is getting worse.  You have a fever.   This information is not intended to replace advice given to you by your health care provider. Make sure you discuss any questions you have with your health care provider.   Document Released: 02/16/2004 Document Revised: 12/17/2014 Document Reviewed: 07/08/2013 Elsevier Interactive Patient Education [employer]

## 2016-03-06 NOTE — Progress Notes (Signed)
Subjective:    Patient ID: Kenneth Casey, male    DOB: 08-28-1956, 60 y.o.   MRN: [id number]  HPI  Mr. Junior Huezo, a 60 year old male with a history of diastolic dysfunction, COPD,  and obesity presents accompanied by wife for a follow up of neuropathy and insomnia.  Mr. Worley states that symptoms have stabilized on current medication regimen.  He has continued to utilized home oxygen at 2 liters. He endorses dyspnea with exertion and air hunger when lying flat. He reports that he experiences anxiety primarily at night whenever he experiences shortness of breath. He reports periodic insomnia.  Mr. Wilmon Pali continues to follow with Dr. Sherene Sires, pulmonology. Patient's symptoms are currently controlled on Symbicort twice daily. He maintains that he has not required albuterol inhaler. Mr. Wilmon Pali is a former smoker with a long history of tobacco use.    He is also following up for neuropathy. Pain is described as intermittent, burning, and shooting. Pain often intensifies with prolonged standing, pivoting, and walking for long distances.  He denies injury, fever, weakness, or swelling to left knee.  Past Medical History  Diagnosis Date  . COPD (chronic obstructive pulmonary disease) (HCC)   . Pneumonia   . Obese    Social History   Social History  . Marital Status: Married    Spouse Name: N/A  . Number of Children: N/A  . Years of Education: N/A   Occupational History  . Not on file.   Social History Main Topics  . Smoking status: Former Smoker -- 2.00 packs/day for 30 years    Types: Cigarettes    Quit date: 01/08/2011  . Smokeless tobacco: Never Used  . Alcohol Use: No  . Drug Use: Not on file  . Sexual Activity: Not on file   Other Topics Concern  . Not on file   Social History Narrative   Immunization History  Administered Date(s) Administered  . Influenza Split 09/10/2015    Review of Systems  Constitutional: Negative for fever, chills and fatigue.  HENT: Negative.    Respiratory: Positive for cough, shortness of breath (with exertion) and wheezing.   Cardiovascular: Negative.  Negative for chest pain, palpitations and leg swelling.  Gastrointestinal: Negative for nausea, vomiting, diarrhea, constipation and rectal pain.  Endocrine: Negative for polydipsia, polyphagia and polyuria.  Genitourinary: Negative.  Negative for difficulty urinating.  Skin: Negative.   Neurological: Negative.  Negative for dizziness and light-headedness.  Hematological: Negative.   Psychiatric/Behavioral: Negative.  Negative for agitation. The patient is not nervous/anxious.        Objective:   Physical Exam  Constitutional: He is oriented to person, place, and time.  Morbid obesity  HENT:  Head: Normocephalic and atraumatic.  Right Ear: Hearing, tympanic membrane, external ear and ear canal normal.  Left Ear: Hearing, tympanic membrane, external ear and ear canal normal.  Mouth/Throat: Uvula is midline and oropharynx is clear and moist.  Eyes: Conjunctivae and EOM are normal. Pupils are equal, round, and reactive to light.  Neck: Normal range of motion. Neck supple.  Cardiovascular: Regular rhythm, S1 normal, S2 normal, normal heart sounds and intact distal pulses.   Pulmonary/Chest: Effort normal. No apnea. No respiratory distress. He has wheezes in the right upper field. He has rhonchi in the right upper field, the right middle field, the left middle field and the left lower field.  Abdominal: Soft. Bowel sounds are normal.  Increased abdominal girth (pendulous)  Musculoskeletal:       Left  knee: He exhibits decreased range of motion. He exhibits no swelling and no erythema.  Neurological: He is alert and oriented to person, place, and time. No cranial nerve deficit or sensory deficit.  Skin: Skin is warm and dry.  Psychiatric: He has a normal mood and affect. His behavior is normal. Judgment and thought content normal.      BP 127/62 mmHg  Pulse 83  Temp(Src)  98.2 F (36.8 C) (Oral)  Resp 18  Ht 5\' 9"  (1.753 m)  Wt 311 lb (141.069 kg)  BMI 45.91 kg/m2  SpO2 94% Assessment & Plan:  1. Neuropathy of left lower extremity Patient states that neuropathy was minimally improved on Gabapentin. Will increase gabapentin to 300 mg HS.  - gabapentin (NEURONTIN) 300 MG capsule; Take 1 capsule (300 mg total) by mouth at bedtime.  Dispense: 30 capsule; Refill: 2  2. Anxiety state Will continue Xanax 0.25 at bedtime.  - ALPRAZolam (XANAX) 0.25 MG tablet; Take 1 tablet (0.25 mg total) by mouth at bedtime.  Dispense: 30 tablet; Refill: 0  3. Wheezing Lung sounds improved after duoneb. Continue Spiriva BID as previously prescribed.  - ipratropium-albuterol (DUONEB) 0.5-2.5 (3) MG/3ML nebulizer solution 3 mL; Take 3 mLs by nebulization once.  4. On home oxygen therapy Will continue on 2 liters of oxygen.  5. COPD GOLD III Will continue Simbicort BID. Follow up with Dr. Sherene SiresWert as scheduled.    RTC: Follow up in 3 months for chronic conditions   Orlan Aversa M, FNP   The patient was given clear instructions to go to ER or return to medical center if symptoms do not improve, worsen or new problems develop. The patient verbalized understanding.

## 2016-03-09 MED FILL — ALBUTEROL 0.083% INHAL SOLN: (2.5 MG/3ML | 30 days supply | Qty: 90 | Fill #1

## 2016-03-12 MED FILL — !VENTOLIN HFA INHALER: 108 (90 BAS | 30 days supply | Qty: 18 | Fill #0

## 2016-04-10 ENCOUNTER — Other Ambulatory Visit: Payer: Self-pay | Admitting: Family Medicine

## 2016-04-12 ENCOUNTER — Telehealth: Payer: Self-pay

## 2016-04-12 DIAGNOSIS — F411 Generalized anxiety disorder: Secondary | ICD-10-CM

## 2016-04-12 MED FILL — GABAPENTIN 300 MG CAPSULE: 300 | 30 days supply | Qty: 30 | Fill #1

## 2016-04-12 MED FILL — ALBUTEROL 0.083% INHAL SOLN: (2.5 MG/3ML | 30 days supply | Qty: 90 | Fill #2

## 2016-04-12 MED FILL — !VENTOLIN HFA INHALER: 108 (90 BAS | 30 days supply | Qty: 18 | Fill #1

## 2016-04-12 NOTE — Telephone Encounter (Signed)
Pt would like to pick up a written refill prescription for Zanax on Monday morning. Thanks!

## 2016-04-12 NOTE — Telephone Encounter (Signed)
Refill request for xanax. LOV 03/06/2016. Please advise. Thanks!

## 2016-04-16 ENCOUNTER — Ambulatory Visit (INDEPENDENT_AMBULATORY_CARE_PROVIDER_SITE_OTHER): Payer: Self-pay | Admitting: Internal Medicine

## 2016-04-16 ENCOUNTER — Encounter: Payer: Self-pay | Admitting: Internal Medicine

## 2016-04-16 VITALS — BP 132/78 | HR 76 | Ht 69.0 in | Wt 309.0 lb

## 2016-04-16 DIAGNOSIS — J9612 Chronic respiratory failure with hypercapnia: Secondary | ICD-10-CM

## 2016-04-16 DIAGNOSIS — J449 Chronic obstructive pulmonary disease, unspecified: Secondary | ICD-10-CM

## 2016-04-16 DIAGNOSIS — J9611 Chronic respiratory failure with hypoxia: Secondary | ICD-10-CM

## 2016-04-16 MED ORDER — ALPRAZOLAM 0.25 MG PO TABS: 0.2500 mg | ORAL_TABLET | Freq: Every day | ORAL | Status: DC

## 2016-04-16 NOTE — Progress Notes (Signed)
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 Recommend follow-up compliance of chronic oxygen therapy.   Discharge Diagnoses:  1. Healthcare associated pneumonia. 2. Acute on chronic respiratory failure with hypoxia. 3. COPD without exacerbation. 4. Chronic diastolic congestive heart failure. Remain compensated. 5. Morbid obesity. 6. Mild normocytic anemia, in part, hemodilutional. 7. Lactic acidosis.  Discharge Condition: Improved.  Diet recommendation: Heart healthy.  Filed Weights   12/13/15 1116 12/13/15 1457  Weight: 141.522 kg (312 lb) 142.883 kg (315 lb)    History of present illness:  The patient is a 60 year old man with history of oxygen dependent COPD. He was recently hospitalized in December for septic shock secondary to community-acquired pneumonia which required intubation. He had been doing fairly well at home until approximately 3 days prior to his presentation to the ED again on 12/12/2014. He complained of shortness of breath, coughing, runny nose, and a headache. In the ED, his chest x-ray revealed right middle lobe atelectasis or infiltrate/airspace disease. His lactic acid was 2.41. He was slightly more hypoxic than usual, oxygenating 88-90% on his usual 3 L of oxygen. He was admitted for further evaluation and management.  Hospital Course:   1. Healthcare associated pneumonia. The patient was recently hospitalized in December after a prolonged stay with ventilator dependent respiratory failure secondary to septic shock, pneumonia, and hypoxic respiratory failure. He  returned on 12/13/15 with shortness of breath and cough. Chest x-ray in the ED revealed right middle lobe atelectasis versus airspace disease. ABG on 2 L of oxygen on admission revealed a pH of 7.4, PCO2 of 46, and PO2 of 67. Cefepime and vancomycin were started. He was continued on oxygen. -Influenza was negative. HIV was nonreactive. Blood cultures were negative to date. Urine Legionella antigen was pending at the time of discharge. Strep will antigen was negative. -Patient improved clinically and symptomatically. His oxygen saturations improved progressively. He did not have significant wheezing, however, at the time of discharge, it was felt that a few days of prednisone would be helpful. Therefore, he was discharged on 3 more days of prednisone along with 5 more days of Ceftin for ongoing treatment. He was also given a prescription for albuterol nebulizer to be used 3 times a day for 5 more days and then every 4-6 hours thereafter. He was encouraged to wear his oxygen more consistently at home.  Acute on chronic respiratory failure with hypoxia. Patient is treated with 2-3 L of nasal cannula oxygen. His oxygen saturations were lower than his normal range on admission. Oxygen was titrated to keep his oxygen saturations 90% or greater. His wife stated that the patient did not wear his oxygen 24/7. Patient was encouraged to wear his oxygen close 24 hours a day 7 days a week as possible. He voiced understanding.  Chronic oxygen dependent COPD. He was continued on Symbicort. There was no significant wheezing on admission, but he did have occasional wheezes prior to discharge, therefore, he was discharged on a very short prednisone taper.   Chronic diastolic heart failure. His heart failure was compensated.  He was continued on Lasix.       12/21/2015 1st [facility] office visit/ Avonne Berkery   Chief Complaint  Patient presents with  . HFU    Pt states that his breathing has improved some, but not  baseline for him. He c/o chest congestion and has had some cough with clear sputum. He has been wheezing some. He is using albuterol inhaler 4-5 x per day and neb 2-4 x per day.   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  Plan B= Backup Only use your albuterol as a rescue medication to  Plan C = crisis Only use nebulizer if you try the ventolin first and it doesn't work  Try prilosec otc 20mg   Take 30-60 min before first meal of the day and Pepcid ac (famotidine) 20 mg one @  bedtime until cough is completely gone for at least a week without the need for cough suppression GERD diet      01/17/2016  f/u ov/Miquela Costabile re:  GOLD III copd/ maint on symbicort 160 when he can afford it  Chief Complaint  Patient presents with  . Follow-up    Breathing continues to improve. He is coughing less. Sometimes prod in the am's with clear to yellow sputum.   doe = MMRC1 = can walk nl pace, flat grade, can't hurry or go uphills or steps s sob   Rare need for saba / almost no neb use at all/ main challenge is paying for care  rec  No need for 02 at rest just use at bedtime and with activity    04/16/2016  f/u ov/Gene Colee re: GOLD III copd with restrictive change from obesity / symb 160 2bid but hfa poor  Chief Complaint  Patient presents with  . Follow-up    Pt c/o cough with clear-yellow mucus, wheeze, SOB and some chest tightness. Pt did [place] yesterday, did not fo under water but received some scrapes and bruises but otherwise is ok.   4lpm  pulsed  walking /2.5 sleeping and prn at rest  Doe x MMRC2 = can't walk a nl pace on a flat grade s sob but does fine slow and flat eg grocery shopping  avg saba once a day hfa and neb same / better when has  access to symbicort    No obvious day to day or daytime variability  or cp  Or   overt sinus or hb symptoms. No unusual exp hx or h/o childhood pna/ asthma or knowledge of premature birth.  Sleeping ok without nocturnal  or early am exacerbation  of respiratory  c/o's or need for noct saba. Also denies any obvious fluctuation of symptoms with weather or environmental changes or other aggravating or alleviating factors except as outlined above   Current Medications, Allergies, Complete Past Medical History, Past Surgical History, Family History, and Social History were reviewed in [facility] electronic medical record.  ROS  The following are not active complaints unless bolded sore throat, dysphagia, dental problems, itching, sneezing,  nasal congestion or excess/ purulent secretions, ear ache,   fever, chills, sweats, unintended wt loss, classically pleuritic or exertional cp, hemoptysis,  orthopnea pnd or leg swelling, presyncope, palpitations, abdominal pain, anorexia, nausea, vomiting, diarrhea  or change in bowel or bladder habits, change in stools or urine, dysuria,hematuria,  rash, arthralgias, visual complaints, headache, numbness, weakness or ataxia or problems with walking or coordination,  change in mood/affect or memory.           Objective:   Physical Exam    obese amb wm    04/16/2016          309  01/17/2016          311   12/20/15 308 lb (139.708 kg)  12/13/15 315 lb (142.883 kg)  12/08/15 312 lb (141.522 kg)    Vital signs reviewed    HEENT: nl dentition, turbinates, and oropharynx. Nl external ear canals without cough reflex   NECK :  without JVD/Nodes/TM/ nl carotid upstrokes bilaterally   LUNGS: no acc muscle use,  Nl contour chest / very min  insp and exp rhonchi bilaterally    CV:  RRR  no s3 or murmur or increase in P2, no edema   ABD:  soft and nontender with nl inspiratory excursion in the supine position. No bruits or organomegaly, bowel sounds  nl  MS:  Nl gait/ ext warm without deformities, calf tenderness, cyanosis or clubbing No obvious joint restrictions   SKIN: warm and dry without lesions    NEURO:  alert, approp, nl sensorium with  no motor deficits    CXR PA and Lateral:   01/17/2016 :    I personally reviewed images and agree with radiology impression as follows:   The lungs remain clear but hyperaerated consistent with and element of emphysema with flattened hemidiaphragms. Mediastinal and hilar contours are unremarkable. Heart size is stable being mildly enlarged. No acute bony abnormality is seen.      Assessment:

## 2016-04-16 NOTE — Telephone Encounter (Signed)
Reviewed [place] Substance Reporting system prior to prescribing controlled medications, no inconsistencies noted.   Meds ordered this encounter  Medications  . ALPRAZolam (XANAX) 0.25 MG tablet    Sig: Take 1 tablet (0.25 mg total) by mouth at bedtime.    Dispense:  30 tablet    Refill:  0    Order Specific Question:  Supervising Provider    Answer:  [name], [name] [[id number]    Massie MaroonHollis,Harveer Sadler M, FNP

## 2016-04-16 NOTE — Patient Instructions (Addendum)
Work on inhaler technique:  relax and gently blow all the way out then take a nice smooth deep breath back in, triggering the inhaler at same time you start breathing in.  Hold for up to 5 seconds if you can. Blow out thru nose. Rinse and gargle with water when done     Plan A = Automatic = Symbicort 160 Take 2 puffs first thing in am and then another 2 puffs about 12 hours later.   Plan B = Backup Only use your albuterol as a rescue medication to be used if you can't catch your breath by resting or doing a relaxed purse lip breathing pattern.  - The less you use it, the better it will work when you need it. - Ok to use the inhaler up to 2 puffs  every 4 hours if you must but call for appointment if use goes up over your usual need - Don't leave home without it !!  (think of it like the spare tire for your car)   Plan C = Crisis - only use your albuterol nebulizer if you first try Plan B and it fails to help > ok to use the nebulizer up to every 4 hours but if start needing it regularly call for immediate appointment  02 2.5 lpm at bedtime, pulsed at 4 lpm with activity/ no 02 at rest  Please schedule a follow up office visit in 6 weeks, call sooner if needed

## 2016-04-17 NOTE — Assessment & Plan Note (Signed)
12/20/2015  extensive coaching HFA effectiveness =    75% > continue symbicort 160 2bid - Spirometry 01/17/2016  FEV1 1.62 (44%)  Ratio 61   Main challenge here is keeping up with meds/ refills/ costs a major concern. Suggested he check with [facility] for free symbicort in meantime given 3 samples = 6 week supply

## 2016-04-17 NOTE — Assessment & Plan Note (Signed)
On 02 3lpm since d/c 11/18/2015 - HCO3  34  12/14/15  - 01/17/2016 sats mid 90's RA at rest so rec 2lpm sleeping / exerting but none needed at rest  - 04/16/2016  Walked 4lpm pulsed x 3 laps @ 185 ft each stopped due to end of study, mild sob, no desat   rec as of 04/16/2016 = 2lpm sleeping/ 4 pulsed with ex

## 2016-05-14 ENCOUNTER — Other Ambulatory Visit: Payer: Self-pay | Admitting: Family Medicine

## 2016-05-25 ENCOUNTER — Other Ambulatory Visit: Payer: Self-pay

## 2016-05-25 MED ORDER — ALBUTEROL SULFATE HFA 108 (90 BASE) MCG/ACT IN AERS: 2.0000 | INHALATION_SPRAY | Freq: Four times a day (QID) | RESPIRATORY_TRACT | Status: DC | PRN

## 2016-05-25 MED FILL — GABAPENTIN 300 MG CAPSULE: 300 | 30 days supply | Qty: 30 | Fill #2

## 2016-05-25 MED FILL — ALBUTEROL 0.083% INHAL SOLN: (2.5 MG/3ML | 30 days supply | Qty: 90 | Fill #3

## 2016-05-25 NOTE — Telephone Encounter (Signed)
Refill for ventolin has been sent into pharmacy. Thanks!

## 2016-05-28 ENCOUNTER — Ambulatory Visit (INDEPENDENT_AMBULATORY_CARE_PROVIDER_SITE_OTHER): Payer: Self-pay | Admitting: Internal Medicine

## 2016-05-28 ENCOUNTER — Encounter: Payer: Self-pay | Admitting: Internal Medicine

## 2016-05-28 VITALS — BP 142/86 | HR 91 | Ht 68.0 in | Wt 304.0 lb

## 2016-05-28 DIAGNOSIS — J9612 Chronic respiratory failure with hypercapnia: Secondary | ICD-10-CM

## 2016-05-28 DIAGNOSIS — J9611 Chronic respiratory failure with hypoxia: Secondary | ICD-10-CM

## 2016-05-28 DIAGNOSIS — J449 Chronic obstructive pulmonary disease, unspecified: Secondary | ICD-10-CM

## 2016-05-28 MED ORDER — GLYCOPYRROLATE-FORMOTEROL 9-4.8 MCG/ACT IN AERO: 2.0000 | INHALATION_SPRAY | Freq: Two times a day (BID) | RESPIRATORY_TRACT | Status: DC

## 2016-05-28 NOTE — Assessment & Plan Note (Signed)
12/20/2015  extensive coaching HFA effectiveness =    75% > continue symbicort 160 2bid - Spirometry 01/17/2016  FEV1 1.62 (44%)  Ratio 61 - 05/28/2016  After extensive coaching HFA effectiveness =    90% try bevespi 2bid  Using too much saba ? Really needed > try bevespi 2 bid x one month and continue if helps and can get [facility], o/w resume symbicort

## 2016-05-28 NOTE — Assessment & Plan Note (Signed)
On 02 3lpm since d/c 11/18/2015 - HCO3  34  12/14/15  - 01/17/2016 sats mid 90's RA at rest so rec 2lpm sleeping / exerting but none needed at rest  - 04/16/2016  Walked 4lpm pulsed x 3 laps @ 185 ft each stopped due to end of study, mild sob, no desat   rec as of 05/28/2016 = 2lpm sleeping/ 4 pulsed with ex

## 2016-05-28 NOTE — Patient Instructions (Addendum)
Plan A = Automatic =  Try Bevespi Take 2 puffs first thing in am and then another 2 puffs about 12 hours later.  Plan B = Backup Only use your albuterol as a rescue medication to be used if you can't catch your breath by resting or doing a relaxed purse lip breathing pattern.  - The less you use it, the better it will work when you need it. - Ok to use the inhaler up to 2 puffs  every 4 hours if you must but call for appointment if use goes up over your usual need - Don't leave home without it !!  (think of it like the spare tire for your car)   Plan C = Crisis - only use your albuterol nebulizer if you first try Plan B and it fails to help > ok to use the nebulizer up to every 4 hours but if start needing it regularly call for immediate appointment  Please schedule a follow up visit in 3 months but call sooner if needed

## 2016-05-28 NOTE — Assessment & Plan Note (Signed)
Body mass index is 46.23 still trending up   Lab Results  Component Value Date   TSH 2.564 12/08/2015    Complicated by prob L FEM cut nerve syndrome Contributing to gerd tendency/ doe/reviewed the need and the process to achieve and maintain neg calorie balance > defer f/u primary care including intermittently monitoring thyroid status

## 2016-05-28 NOTE — Progress Notes (Signed)
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 Healthcare associated pneumonia. 2. Acute on chronic respiratory failure with hypoxia. 3. COPD without exacerbation. 4. Chronic diastolic congestive heart failure. Remain compensated. 5. Morbid obesity. 6. Mild normocytic anemia, in part, hemodilutional. 7. Lactic acidosis.  Discharge Condition: Improved.  Diet recommendation: Heart healthy.  Filed Weights   12/13/15 1116 12/13/15 1457  Weight: 141.522 kg (312 lb) 142.883 kg (315 lb)    History of present illness:  The patient is a 60 year old man with history of oxygen dependent COPD. He was recently hospitalized in December for septic shock secondary to community-acquired pneumonia which required intubation. He had been doing fairly well at home until approximately 3 days prior to his presentation to the ED again on 12/12/2014. He complained of shortness of breath, coughing, runny nose, and a headache. In the ED, his chest x-ray revealed right middle lobe atelectasis or infiltrate/airspace disease. His lactic acid was 2.41. He was slightly more hypoxic than usual, oxygenating 88-90% on his usual 3 L of oxygen. He was admitted for further evaluation and management.  Hospital Course:   1. Healthcare associated pneumonia. The patient was recently hospitalized in December after a prolonged stay with ventilator dependent respiratory failure secondary to septic shock, pneumonia, and hypoxic respiratory failure. He returned on 12/13/15 with shortness of breath and cough. Chest x-ray in the ED revealed right middle lobe atelectasis  versus airspace disease. ABG on 2 L of oxygen on admission revealed a pH of 7.4, PCO2 of 46, and PO2 of 67. Cefepime and vancomycin were started. He was continued on oxygen. -Influenza was negative. HIV was nonreactive. Blood cultures were negative to date. Urine Legionella antigen was pending at the time of discharge. Strep will antigen was negative. -Patient improved clinically and symptomatically. His oxygen saturations improved progressively. He did not have significant wheezing, however, at the time of discharge, it was felt that a few days of prednisone would be helpful. Therefore, he was discharged on 3 more days of prednisone along with 5 more days of Ceftin for ongoing treatment. He was also given a prescription for albuterol nebulizer to be used 3 times a day for 5 more days and then every 4-6 hours thereafter. He was encouraged to wear his oxygen more consistently at home.  Acute on chronic respiratory failure with hypoxia. Patient is treated with 2-3 L of nasal cannula oxygen. His oxygen saturations were lower than his normal range on admission. Oxygen was titrated to keep his oxygen saturations 90% or greater. His wife stated that the patient did not wear his oxygen 24/7. Patient was encouraged to wear his oxygen close 24 hours a day 7 days a week as possible. He voiced understanding.  Chronic oxygen dependent COPD. He was continued on Symbicort. There was no significant wheezing on admission, but he did have occasional wheezes prior to discharge, therefore, he was discharged on a very short prednisone taper.   Chronic diastolic heart failure. His heart failure was compensated. He was continued on Lasix.       12/21/2015 1st [facility] office visit/  Annaleise Burger   Chief Complaint  Patient presents with  . HFU    Pt states that his breathing has improved some, but not baseline for him. He c/o chest congestion and has had some cough with clear sputum. He has been wheezing some. He is  using albuterol inhaler 4-5 x per day and neb 2-4 x per day.   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  Plan B= Backup Only use your albuterol as a rescue medication to  Plan C = crisis Only use nebulizer if you try the ventolin first and it doesn't work  Try prilosec otc   Take 30-60 min before first meal of the day and Pepcid ac (famotidine) 20 mg one @  bedtime until cough is completely gone for at least a week without the need for cough suppression GERD diet      01/17/2016  f/u ov/Lonya Johannesen re:  GOLD III copd/ maint on symbicort 160 when he can afford it  Chief Complaint  Patient presents with  . Follow-up    Breathing continues to improve. He is coughing less. Sometimes prod in the am's with clear to yellow sputum.   doe = MMRC1 = can walk nl pace, flat grade, can't hurry or go uphills or steps s sob   Rare need for saba / almost no neb use at all/ main challenge is paying for care  rec  No need for 02 at rest just use at bedtime and with activity    04/16/2016  f/u ov/Chevy Virgo re: GOLD III copd with restrictive change from obesity / symb 160 2bid but hfa poor  Chief Complaint  Patient presents with  . Follow-up    Pt c/o cough with clear-yellow mucus, wheeze, SOB and some chest tightness. Pt did fall [place] lake yesterday, did not fo under water but received some scrapes and bruises but otherwise is ok.   4lpm  pulsed  walking /2.5 sleeping and prn at rest  Doe x MMRC2 = can't walk a nl pace on a flat grade s sob but does fine slow and flat eg grocery shopping  avg saba once a day hfa and neb same / better when has access to symbicort  rec Work on inhaler technique:   Plan A = Automatic = Symbicort 160 Take 2 puffs first thing in am  and then another 2 puffs about 12 hours later.  Plan B = Backup Only use your albuterol as a rescue medication Plan C = Crisis - only use your albuterol nebulizer if you first try Plan B and it fails to help > ok to use the nebulizer up to every 4 hours but if start needing it regularly call for immediate appointment 02 2.5 lpm at bedtime, pulsed at 4 lpm with activity/ no 02 at rest    05/28/2016  f/u ov/Winston Sobczyk re: GOLD III copd  symbicort 160 2bid/ 02 with ex and hs  Chief Complaint  Patient presents with  . Follow-up    Breathing worse here recently due to increased humidity. Cough is unchanged. He is using rescue inhaler at least 3 x per day and neb 2 x every night.  slowed down by LFem nerve burning/ heat, but does fine on 02 with indoor walking   No obvious day to day or daytime variability  or cp  Or   overt sinus or hb symptoms. No unusual exp hx or h/o childhood pna/ asthma or knowledge of premature birth.  Sleeping ok without nocturnal  or early am exacerbation  of respiratory  c/o's or need for noct saba. Also denies any obvious fluctuation of symptoms with weather or environmental changes or other aggravating or alleviating factors except as outlined above   Current Medications, Allergies, Complete Past Medical History, Past Surgical History, Family History, and Social History were reviewed in [facility] electronic medical record.  ROS  The following are not active complaints unless bolded sore throat, dysphagia, dental problems, itching, sneezing,  nasal congestion or excess/ purulent secretions, ear ache,   fever, chills, sweats, unintended wt loss, classically pleuritic or exertional cp, hemoptysis,  orthopnea pnd or leg swelling, presyncope, palpitations, abdominal pain, anorexia, nausea, vomiting, diarrhea  or change in bowel or bladder habits, change in stools or urine, dysuria,hematuria,  rash, arthralgias, visual complaints, headache, numbness, weakness or ataxia or  problems with walking or coordination,  change in mood/affect or memory.           Objective:   Physical Exam    obese amb wm    04/16/2016          309 >  05/28/2016  305  01/17/2016          311   12/20/15 308 lb (139.708 kg)  12/13/15 315 lb (142.883 kg)  12/08/15 312 lb (141.522 kg)    Vital signs reviewed    HEENT: nl dentition, turbinates, and oropharynx. Nl external ear canals without cough reflex   NECK :  without JVD/Nodes/TM/ nl carotid upstrokes bilaterally   LUNGS: no acc muscle use,  Nl contour chest / very min  insp and exp rhonchi bilaterally    CV:  RRR  no s3 or murmur or increase in P2, no edema   ABD:  soft and nontender with nl inspiratory excursion in the supine position. No bruits or organomegaly, bowel sounds nl  MS:  Nl gait/ ext warm without deformities, calf tenderness, cyanosis or clubbing No obvious joint restrictions   SKIN: warm and dry without lesions    NEURO:  alert, approp, nl sensorium with  no motor deficits    CXR PA and Lateral:   01/17/2016 :    I personally reviewed images and agree with radiology impression as follows:   The lungs remain clear but hyperaerated consistent with and element of emphysema with flattened hemidiaphragms. Mediastinal and hilar contours are unremarkable. Heart size is stable being mildly enlarged. No acute bony abnormality is seen.      Assessment:

## 2016-05-31 MED FILL — SYMBICORT 80-4.5 MCG INH: 80-4.5 | 30 days supply | Qty: 10 | Fill #0

## 2016-06-04 ENCOUNTER — Ambulatory Visit (INDEPENDENT_AMBULATORY_CARE_PROVIDER_SITE_OTHER): Payer: Self-pay | Admitting: Family Medicine

## 2016-06-04 ENCOUNTER — Encounter: Payer: Self-pay | Admitting: Family Medicine

## 2016-06-04 VITALS — BP 122/73 | HR 86 | Temp 97.9°F | Resp 18 | Ht 69.0 in | Wt 311.0 lb

## 2016-06-04 DIAGNOSIS — I519 Heart disease, unspecified: Secondary | ICD-10-CM

## 2016-06-04 DIAGNOSIS — R600 Localized edema: Secondary | ICD-10-CM

## 2016-06-04 DIAGNOSIS — Z9981 Dependence on supplemental oxygen: Secondary | ICD-10-CM

## 2016-06-04 DIAGNOSIS — J449 Chronic obstructive pulmonary disease, unspecified: Secondary | ICD-10-CM

## 2016-06-04 DIAGNOSIS — F411 Generalized anxiety disorder: Secondary | ICD-10-CM

## 2016-06-04 DIAGNOSIS — I5189 Other ill-defined heart diseases: Secondary | ICD-10-CM

## 2016-06-04 LAB — BASIC METABOLIC PANEL WITH GFR
BUN: 17 mg/dL (ref 7–25)
CHLORIDE: 101 mmol/L (ref 98–110)
CO2: 29 mmol/L (ref 20–31)
Calcium: 9 mg/dL (ref 8.6–10.3)
Creat: 0.81 mg/dL (ref 0.70–1.33)
GFR, Est African American: >89 mL/min (ref >=60)
Glucose, Bld: 97 mg/dL (ref 65–99)
Potassium: 4.2 mmol/L (ref 3.5–5.3)
SODIUM: 139 mmol/L (ref 135–146)

## 2016-06-04 MED ORDER — ALPRAZOLAM 0.25 MG PO TABS: 0.2500 mg | ORAL_TABLET | Freq: Every day | ORAL | Status: DC

## 2016-06-04 MED ORDER — BUSPIRONE HCL 5 MG PO TABS: 5.0000 mg | ORAL_TABLET | Freq: Two times a day (BID) | ORAL | Status: DC

## 2016-06-04 NOTE — Progress Notes (Signed)
Subjective:    Patient ID: Kenneth Casey, male    DOB: 12/11/1955, 60 y.o.   MRN: [id number]  HPI  [name], a 60 year old male with a history of diastolic dysfunction, COPD,  and obesity presents accompanied by wife for a follow up of neuropathy and insomnia.  [name] states that symptoms have stabilized on current medication regimen.  He has continued to utilized home oxygen at 2 liters. He endorses dyspnea with exertion and air hunger when lying flat. He reports that he experiences anxiety primarily at night whenever he experiences shortness of breath. He reports periodic insomnia.  [name] continues to follow with Dr. Sherene SiresWert, pulmonology. Patient's symptoms are currently controlled on Symbicort twice daily. He maintains that he has not required albuterol inhaler. [name] is a former smoker with a long history of tobacco use. Patient complains of increasing anxiety.   He has the following symptoms: feelings of losing control, insomnia, irritable, racing thoughts, shortness of breath. Onset of symptoms was several months ago. He was started on Xanax 0.25 mg HS one month ago. He says that sleep has improved moderately, however; he is continuously irritable during the day. He denies current suicidal and homicidal ideation. F  Past Medical History  Diagnosis Date  . COPD (chronic obstructive pulmonary disease) (HCC)   . Pneumonia   . Obese    Social History   Social History  . Marital Status: Married    Spouse Name: N/A  . Number of Children: N/A  . Years of Education: N/A   Occupational History  . Not on file.   Social History Main Topics  . Smoking status: Former Smoker -- 2.00 packs/day for 30 years    Types: Cigarettes    Quit date: 01/08/2011  . Smokeless tobacco: Never Used  . Alcohol Use: No  . Drug Use: Not on file  . Sexual Activity: Not on file   Other Topics Concern  . Not on file   Social History Narrative   Immunization History  Administered  Date(s) Administered  . Influenza Split 09/10/2015    Review of Systems  Constitutional: Negative for fever, chills and fatigue.  HENT: Negative.   Respiratory: Positive for cough, shortness of breath (with exertion) and wheezing.   Cardiovascular: Negative.  Negative for chest pain, palpitations and leg swelling.  Gastrointestinal: Negative for nausea, vomiting, diarrhea, constipation and rectal pain.  Endocrine: Negative for polydipsia, polyphagia and polyuria.  Genitourinary: Negative.  Negative for difficulty urinating.  Musculoskeletal: Negative.   Skin: Negative.   Neurological: Negative.  Negative for dizziness and light-headedness.  Hematological: Negative.   Psychiatric/Behavioral: Negative.  Negative for agitation. The patient is not nervous/anxious.        Objective:   Physical Exam  Constitutional: He is oriented to person, place, and time.  Morbid obesity  HENT:  Head: Normocephalic and atraumatic.  Right Ear: Hearing, tympanic membrane, external ear and ear canal normal.  Left Ear: Hearing, tympanic membrane, external ear and ear canal normal.  Mouth/Throat: Uvula is midline and oropharynx is clear and moist.  Eyes: Conjunctivae and EOM are normal. Pupils are equal, round, and reactive to light.  Neck: Normal range of motion. Neck supple.  Cardiovascular: Regular rhythm, S1 normal, S2 normal, normal heart sounds and intact distal pulses.   Pulmonary/Chest: Effort normal. No apnea. No respiratory distress. He has wheezes in the right upper field. He has rhonchi in the right upper field, the right middle field, the left middle  field and the left lower field.  Abdominal: Soft. Bowel sounds are normal.  Increased abdominal girth (pendulous)  Musculoskeletal:       Left knee: He exhibits decreased range of motion. He exhibits no swelling and no erythema.  Neurological: He is alert and oriented to person, place, and time. No cranial nerve deficit or sensory deficit.   Skin: Skin is warm and dry.  Psychiatric: He has a normal mood and affect. His behavior is normal. Judgment and thought content normal.      BP 122/73 mmHg  Pulse 86  Temp(Src) 97.9 F (36.6 C) (Oral)  Resp 18  Ht 5\' 9"  (1.753 m)  Wt 311 lb (141.069 kg)  BMI 45.91 kg/m2  SpO2 97% Assessment & Plan:   1. Generalized anxiety disorder GAD 7 : Generalized Anxiety Score 06/04/2016  Nervous, Anxious, on Edge 1  Control/stop worrying 0  Worry too much - different things 1  Trouble relaxing 1  Restless 0  Easily annoyed or irritable 2  Afraid - awful might happen 0  Total GAD 7 Score 5  Anxiety Difficulty Somewhat difficult     - busPIRone (BUSPAR) 5 MG tablet; Take 1 tablet (5 mg total) by mouth 2 (two) times daily.  Dispense: 60 tablet; Refill: 3 - ALPRAZolam (XANAX) 0.25 MG tablet; Take 1 tablet (0.25 mg total) by mouth at bedtime.  Dispense: 30 tablet; Refill: 0  2. Diastolic dysfunction Will continue medications at current dosages.  - BASIC METABOLIC PANEL WITH GFR  3. Bilateral edema of lower extremity Will continue Furosemide 40 mg daily as previously prescribed. Will also check potassium level on today.   4. On home oxygen therapy Continue O2 at 2 liters as ordered by pulmonology  5. COPD GOLD III  Will continue Simbicort BID. Follow up with Dr. Sherene SiresWert as scheduled.    RTC: Follow up in 3 months for chronic conditions   Hollis,Lachina M, FNP   The patient was given clear instructions to go to ER or return to medical center if symptoms do not improve, worsen or new problems develop. The patient verbalized understanding.

## 2016-06-04 NOTE — Patient Instructions (Signed)
Generalized Anxiety Disorder Generalized anxiety disorder (GAD) is a mental disorder. It interferes with life functions, including relationships, work, and school. GAD is different from normal anxiety, which everyone experiences at some point in their lives in response to specific life events and activities. Normal anxiety actually helps us prepare for and get through these life events and activities. Normal anxiety goes away after the event or activity is over.  GAD causes anxiety that is not necessarily related to specific events or activities. It also causes excess anxiety in proportion to specific events or activities. The anxiety associated with GAD is also difficult to control. GAD can vary from mild to severe. People with severe GAD can have intense waves of anxiety with physical symptoms (panic attacks).  SYMPTOMS The anxiety and worry associated with GAD are difficult to control. This anxiety and worry are related to many life events and activities and also occur more days than not for 6 months or longer. People with GAD also have three or more of the following symptoms (one or more in children):  Restlessness.   Fatigue.  Difficulty concentrating.   Irritability.  Muscle tension.  Difficulty sleeping or unsatisfying sleep. DIAGNOSIS GAD is diagnosed through an assessment by your health care provider. Your health care provider will ask you questions aboutyour mood,physical symptoms, and events in your life. Your health care provider may ask you about your medical history and use of alcohol or drugs, including prescription medicines. Your health care provider may also do a physical exam and blood tests. Certain medical conditions and the use of certain substances can cause symptoms similar to those associated with GAD. Your health care provider may refer you to a mental health specialist for further evaluation. TREATMENT The following therapies are usually used to treat GAD:    Medication. Antidepressant medication usually is prescribed for long-term daily control. Antianxiety medicines may be added in severe cases, especially when panic attacks occur.   Talk therapy (psychotherapy). Certain types of talk therapy can be helpful in treating GAD by providing support, education, and guidance. A form of talk therapy called cognitive behavioral therapy can teach you healthy ways to think about and react to daily life events and activities.  Stress managementtechniques. These include yoga, meditation, and exercise and can be very helpful when they are practiced regularly. A mental health specialist can help determine which treatment is best for you. Some people see improvement with one therapy. However, other people require a combination of therapies.   This information is not intended to replace advice given to you by your health care provider. Make sure you discuss any questions you have with your health care provider.   Document Released: 03/23/2013 Document Revised: 12/17/2014 Document Reviewed: 03/23/2013 Elsevier Interactive Patient Education 2016 [employer] Inc.  

## 2016-06-05 ENCOUNTER — Other Ambulatory Visit: Payer: Self-pay

## 2016-06-05 MED ORDER — ALBUTEROL SULFATE HFA 108 (90 BASE) MCG/ACT IN AERS: 2.0000 | INHALATION_SPRAY | Freq: Four times a day (QID) | RESPIRATORY_TRACT | Status: DC | PRN

## 2016-06-05 NOTE — Telephone Encounter (Signed)
Refill for ventolin inhaler sent into pharmacy. Thanks!

## 2016-06-13 ENCOUNTER — Other Ambulatory Visit: Payer: Self-pay | Admitting: Family Medicine

## 2016-07-04 ENCOUNTER — Other Ambulatory Visit: Payer: Self-pay | Admitting: Family Medicine

## 2016-07-04 DIAGNOSIS — F411 Generalized anxiety disorder: Secondary | ICD-10-CM

## 2016-07-04 DIAGNOSIS — G5792 Unspecified mononeuropathy of left lower limb: Secondary | ICD-10-CM

## 2016-07-04 MED FILL — VENTOLIN HFA 90 MCG INHALER: 108 (90 BAS | 25 days supply | Qty: 18 | Fill #0

## 2016-07-04 MED FILL — SYMBICORT 80-4.5 MCG INH: 80-4.5 | 30 days supply | Qty: 10 | Fill #1

## 2016-07-04 MED FILL — GABAPENTIN 300 MG CAPSULE: 300 | 30 days supply | Qty: 30 | Fill #0

## 2016-07-04 MED FILL — ALBUTEROL SUL 2.5 MG/3 ML S: (2.5 MG/3ML | 30 days supply | Qty: 90 | Fill #4

## 2016-07-19 ENCOUNTER — Other Ambulatory Visit: Payer: Self-pay | Admitting: Family Medicine

## 2016-08-07 ENCOUNTER — Other Ambulatory Visit: Payer: Self-pay | Admitting: *Deleted

## 2016-08-07 DIAGNOSIS — F411 Generalized anxiety disorder: Secondary | ICD-10-CM

## 2016-08-07 MED ORDER — ALPRAZOLAM 0.25 MG PO TABS: 0.2500 mg | ORAL_TABLET | Freq: Every day | ORAL | 0 refills | Status: DC

## 2016-08-07 NOTE — Telephone Encounter (Signed)
Patient last OV was 06/04/16. Patient last fill on Xanax was 07/05/16. Patients Xanax was refilled for 30 days with no additional refills per Covering PCP. No further questions at this time.

## 2016-08-09 ENCOUNTER — Telehealth: Payer: Self-pay

## 2016-08-14 MED FILL — ALBUTEROL SUL 2.5 MG/3 ML S: (2.5 MG/3ML | 30 days supply | Qty: 90 | Fill #5

## 2016-08-14 MED FILL — VENTOLIN HFA 90 MCG INHALER: 108 (90 BAS | 25 days supply | Qty: 18 | Fill #1

## 2016-08-14 MED FILL — SYMBICORT 80-4.5 MCG INH: 80-4.5 | 30 days supply | Qty: 10 | Fill #2

## 2016-08-14 MED FILL — GABAPENTIN 300 MG CAPSULE: 300 | 30 days supply | Qty: 30 | Fill #1

## 2016-08-20 NOTE — Telephone Encounter (Signed)
Xanax was refilled 08/07/16.

## 2016-08-23 ENCOUNTER — Other Ambulatory Visit: Payer: Self-pay | Admitting: Family Medicine

## 2016-08-24 ENCOUNTER — Other Ambulatory Visit: Payer: Self-pay | Admitting: Family Medicine

## 2016-09-03 ENCOUNTER — Ambulatory Visit (INDEPENDENT_AMBULATORY_CARE_PROVIDER_SITE_OTHER): Payer: Self-pay | Admitting: Internal Medicine

## 2016-09-03 ENCOUNTER — Encounter: Payer: Self-pay | Admitting: Internal Medicine

## 2016-09-03 ENCOUNTER — Ambulatory Visit (INDEPENDENT_AMBULATORY_CARE_PROVIDER_SITE_OTHER): Payer: Self-pay | Admitting: Family Medicine

## 2016-09-03 VITALS — BP 162/82 | HR 90 | Temp 97.9°F | Ht 68.0 in | Wt 316.0 lb

## 2016-09-03 VITALS — BP 136/82 | HR 89 | Ht 68.0 in | Wt 319.0 lb

## 2016-09-03 DIAGNOSIS — J9611 Chronic respiratory failure with hypoxia: Secondary | ICD-10-CM

## 2016-09-03 DIAGNOSIS — Z131 Encounter for screening for diabetes mellitus: Secondary | ICD-10-CM

## 2016-09-03 DIAGNOSIS — F411 Generalized anxiety disorder: Secondary | ICD-10-CM

## 2016-09-03 DIAGNOSIS — J449 Chronic obstructive pulmonary disease, unspecified: Secondary | ICD-10-CM

## 2016-09-03 DIAGNOSIS — Z23 Encounter for immunization: Secondary | ICD-10-CM

## 2016-09-03 DIAGNOSIS — J9612 Chronic respiratory failure with hypercapnia: Secondary | ICD-10-CM

## 2016-09-03 DIAGNOSIS — Z0000 Encounter for general adult medical examination without abnormal findings: Secondary | ICD-10-CM

## 2016-09-03 DIAGNOSIS — Z1211 Encounter for screening for malignant neoplasm of colon: Secondary | ICD-10-CM

## 2016-09-03 LAB — CBC WITH DIFFERENTIAL/PLATELET
BASOS PCT: 0 %
Basophils Absolute: 0 cells/uL (ref 0–200)
Eosinophils Absolute: 345 cells/uL (ref 15–500)
Eosinophils Relative: 3 %
HCT: 45.5 % (ref 38.5–50.0)
Hemoglobin: 14.4 g/dL (ref 13.2–17.1)
LYMPHS PCT: 20 %
Lymphs Abs: 2300 cells/uL (ref 850–3900)
MCH: 27.2 pg (ref 27.0–33.0)
MCHC: 31.6 g/dL — ABNORMAL LOW (ref 32.0–36.0)
MCV: 85.8 fL (ref 80.0–100.0)
MONO ABS: 690 {cells}/uL (ref 200–950)
MONOS PCT: 6 %
MPV: 10.5 fL (ref 7.5–12.5)
Neutro Abs: 8165 cells/uL — ABNORMAL HIGH (ref 1500–7800)
Neutrophils Relative %: 71 %
PLATELETS: 319 10*3/uL (ref 140–400)
RBC: 5.3 MIL/uL (ref 4.20–5.80)
RDW: 14.8 % (ref 11.0–15.0)
WBC: 11.5 10*3/uL — AB (ref 3.8–10.8)

## 2016-09-03 LAB — COMPLETE METABOLIC PANEL WITH GFR
ALT: 12 U/L (ref 9–46)
AST: 11 U/L (ref 10–35)
Albumin: 3.9 g/dL (ref 3.6–5.1)
Alkaline Phosphatase: 65 U/L (ref 40–115)
BUN: 17 mg/dL (ref 7–25)
CHLORIDE: 100 mmol/L (ref 98–110)
CO2: 25 mmol/L (ref 20–31)
CREATININE: 0.98 mg/dL (ref 0.70–1.33)
Calcium: 9.2 mg/dL (ref 8.6–10.3)
GFR, Est African American: >89 mL/min (ref >=60)
GFR, Est Non African American: 85 mL/min (ref >=60)
Glucose, Bld: 83 mg/dL (ref 65–99)
Potassium: 4.6 mmol/L (ref 3.5–5.3)
Sodium: 138 mmol/L (ref 135–146)
Total Bilirubin: 0.4 mg/dL (ref 0.2–1.2)
Total Protein: 6.8 g/dL (ref 6.1–8.1)

## 2016-09-03 LAB — LIPID PANEL
Cholesterol: 168 mg/dL (ref 125–200)
HDL: 24 mg/dL — ABNORMAL LOW (ref >=40)
LDL CALC: 115 mg/dL (ref <130)
TRIGLYCERIDES: 143 mg/dL (ref <150)
Total CHOL/HDL Ratio: 7 Ratio — ABNORMAL HIGH (ref <=5.0)
VLDL: 29 mg/dL (ref <30)

## 2016-09-03 MED ORDER — ALPRAZOLAM 0.25 MG PO TABS: 0.2500 mg | ORAL_TABLET | Freq: Every day | ORAL | 0 refills | Status: AC

## 2016-09-03 MED ORDER — GABAPENTIN 300 MG PO CAPS: 300.0000 mg | ORAL_CAPSULE | Freq: Three times a day (TID) | ORAL | 1 refills | Status: DC

## 2016-09-03 MED ORDER — CARVEDILOL 6.25 MG PO TABS: 6.2500 mg | ORAL_TABLET | Freq: Two times a day (BID) | ORAL | 1 refills | Status: DC

## 2016-09-03 MED ORDER — BUSPIRONE HCL 5 MG PO TABS: 5.0000 mg | ORAL_TABLET | Freq: Two times a day (BID) | ORAL | 3 refills | Status: DC

## 2016-09-03 MED ORDER — FUROSEMIDE 40 MG PO TABS: 40.0000 mg | ORAL_TABLET | Freq: Every day | ORAL | 1 refills | Status: DC

## 2016-09-03 NOTE — Progress Notes (Signed)
Kenneth Casey, is a 60 y.o. male  UJW:[id number]:[id number]  FAO:[id number]  DOB - 07/11/1956  CC:  Chief Complaint  Patient presents with  . COPD  . Anxiety       HPI: Kenneth Casey is a 60 y.o. male here for follow-up chronic conditions. He has a history of CFH with diastolic dysfunction, COPD Obesity, neuropathy, anxiety.His COPD is managed by pulmonology. He is needing his Xanax, Buspar, Carvedilor, Lasix and gabapentin. He reports the gabapentin is not helping sufficiently for his nerve type leg pain and ask about increasing the dosage. He reports feeling pretty well. He is on home 02.His bp is 162/82 today but he forgot to take BP medication this morning.  Health Maintenance: He accepts a flu shot today. He needs stool cards for colon cancer screening and PSA for prostate cancer screening. He needs Tdap and pneumonia but does not want today. He reports a regular diet and no exercise.  Allergies  Allergen Reactions  . Adhesive [Tape] Rash   Past Medical History:  Diagnosis Date  . COPD (chronic obstructive pulmonary disease) (HCC)   . Obese   . Pneumonia    Current Outpatient Prescriptions on File Prior to Visit  Medication Sig Dispense Refill  . acetaminophen (TYLENOL) 500 MG tablet Take 1,000 mg by mouth every 6 (six) hours as needed for moderate pain.    Marland Kitchen. albuterol (PROVENTIL HFA;VENTOLIN HFA) 108 (90 Base) MCG/ACT inhaler Inhale 2 puffs into the lungs every 6 (six) hours as needed for wheezing or shortness of breath. 1 Inhaler 2  . albuterol (PROVENTIL) (2.5 MG/3ML) 0.083% nebulizer solution Take 2.5 mg by nebulization every 4 (four) hours as needed for wheezing or shortness of breath.    . fluticasone (FLONASE) 50 MCG/ACT nasal spray Place 2 sprays into both nostrils daily as needed for allergies or rhinitis.    . OXYGEN 2.5 with sleep and exertion    . Glycopyrrolate-Formoterol (BEVESPI AEROSPHERE) 9-4.8 MCG/ACT AERO Inhale 2 puffs into the lungs 2 (two) times daily. (Patient  not taking: Reported on 09/03/2016) 1 Inhaler 11   No current facility-administered medications on file prior to visit.    Family History  Problem Relation Age of Onset  . Cancer Mother     breast cancer, lung cancer  . Diabetes Father   . Cancer Maternal Aunt   . Heart disease Paternal Uncle    Social History   Social History  . Marital status: Married    Spouse name: N/A  . Number of children: N/A  . Years of education: N/A   Occupational History  . Not on file.   Social History Main Topics  . Smoking status: Former Smoker    Packs/day: 2.00    Years: 30.00    Types: Cigarettes    Quit date: 01/08/2011  . Smokeless tobacco: Never Used  . Alcohol use No  . Drug use: Unknown  . Sexual activity: Not on file   Other Topics Concern  . Not on file   Social History Narrative  . No narrative on file    Review of Systems: Constitutional: Positive for fatigue. Skin: Negative HENT: Negative  Eyes: Negative  Neck: Negative Respiratory: positive for shortness of breath, coughing wheezing intermittently (has appt with pulmonology this afternoon) Cardiovascular: Negative Gastrointestinal: Negative Genitourinary: Negative  Musculoskeletal: Positive for knee pain on occ.    Neurological: Negative  Hematological: Negative  Psychiatric/Behavioral: Negative    Objective:   Vitals:   09/03/16 1317  BP: (!) 162/82  Pulse: 90  Temp: 97.9 F (36.6 C)    Physical Exam: Constitutional: Patient appears well-developed and well-nourished. No distress.Using o2 HENT: Normocephalic, atraumatic, External right and left ear normal. Oropharynx is clear and moist.  Eyes: Conjunctivae and EOM are normal. PERRLA, no scleral icterus. Neck: Normal ROM. Neck supple. No lymphadenopathy, No thyromegaly. CVS: RRR, S1/S2 +, no murmurs, no gallops, no rubs Pulmonary: Effort and breath sounds normal, no stridor, rhonchi, wheezes, rales.  Abdominal: Soft. Normoactive BS,, no distension,  tenderness, rebound or guarding.  Musculoskeletal: Normal range of motion. No edema and no tenderness.  Neuro: Alert.Normal muscle tone coordination. Non-focal Skin: Skin is warm and dry. No rash noted. Not diaphoretic. No erythema. No pallor. Psychiatric: Normal mood and affect. Behavior, judgment, thought content normal.  Lab Results  Component Value Date   WBC 8.4 12/14/2015   HGB 11.8 (L) 12/14/2015   HCT 38.6 (L) 12/14/2015   MCV 93.5 12/14/2015   PLT 326 12/14/2015   Lab Results  Component Value Date   CREATININE 0.81 06/04/2016   BUN 17 06/04/2016   NA 139 06/04/2016   K 4.2 06/04/2016   CL 101 06/04/2016   CO2 29 06/04/2016    Lab Results  Component Value Date   HGBA1C 6.0 (H) 12/08/2015   Lipid Panel  No results found for: CHOL, TRIG, HDL, CHOLHDL, VLDL, LDLCALC     Assessment and plan:   1. Need for prophylactic vaccination and inoculation against influenza  - Flu Vaccine QUAD 36+ mos PF IM (Fluarix & Fluzone Quad PF)  2. Healthcare maintenance  - CBC with Differential - COMPLETE METABOLIC PANEL WITH GFR - Lipid panel - Hepatitis C Antibody - PSA  3. Screening for diabetes mellitus  - Hemoglobin A1c  4. Special screening for malignant neoplasms, colon  - POC Hemoccult Bld/Stl (3-Cd Home Screen); Future  5. Generalized anxiety disorder  - busPIRone (BUSPAR) 5 MG tablet; Take 1 tablet (5 mg total) by mouth 2 (two) times daily.  Dispense: 180 tablet; Refill: 3 - ALPRAZolam (XANAX) 0.25 MG tablet; Take 1 tablet (0.25 mg total) by mouth at bedtime.  Dispense: 30 tablet; Refill: 0   No Follow-up on file.  The patient was given clear instructions to go to ER or return to medical center if symptoms don't improve, worsen or new problems develop. The patient verbalized understanding.    Henrietta Hoover FNP  09/03/2016, 3:24 PM

## 2016-09-03 NOTE — Assessment & Plan Note (Addendum)
On 02 3lpm since d/c 11/18/2015 - HCO3  34  12/14/15  - 01/17/2016 sats mid 90's RA at rest so rec 2lpm sleeping / exerting but none needed at rest  - 04/16/2016  Walked 4lpm pulsed x 3 laps @ 185 ft each stopped due to end of study, mild sob, no desat   rec as of 09/03/2016 = 2.5 lpm sleeping/  4  pulsed with ex  - no need at rest

## 2016-09-03 NOTE — Progress Notes (Signed)
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 Healthcare associated pneumonia. 2. Acute on chronic respiratory failure with hypoxia. 3. COPD without exacerbation. 4. Chronic diastolic congestive heart failure. Remain compensated. 5. Morbid obesity. 6. Mild normocytic anemia, in part, hemodilutional. 7. Lactic acidosis.  Discharge Condition: Improved.  Diet recommendation: Heart healthy.  Filed Weights   12/13/15 1116 12/13/15 1457  Weight: 141.522 kg (312 lb) 142.883 kg (315 lb)    History of present illness:  The patient is a 60 year old man with history of oxygen dependent COPD. He was recently hospitalized in December for septic shock secondary to community-acquired pneumonia which required intubation. He had been doing fairly well at home until approximately 3 days prior to his presentation to the ED again on 12/12/2014. He complained of shortness of breath, coughing, runny nose, and a headache. In the ED, his chest x-ray revealed right middle lobe atelectasis or infiltrate/airspace disease. His lactic acid was 2.41. He was slightly more hypoxic than usual, oxygenating 88-90% on his usual 3 L of oxygen. He was admitted for further evaluation and management.  Hospital Course:   1. Healthcare associated pneumonia. The patient was recently hospitalized in December after a prolonged stay with ventilator dependent respiratory failure secondary to septic shock, pneumonia, and hypoxic respiratory failure. He returned on 12/13/15 with shortness of breath and cough. Chest x-ray in the ED revealed right middle lobe atelectasis  versus airspace disease. ABG on 2 L of oxygen on admission revealed a pH of 7.4, PCO2 of 46, and PO2 of 67. Cefepime and vancomycin were started. He was continued on oxygen. -Influenza was negative. HIV was nonreactive. Blood cultures were negative to date. Urine Legionella antigen was pending at the time of discharge. Strep will antigen was negative. -Patient improved clinically and symptomatically. His oxygen saturations improved progressively. He did not have significant wheezing, however, at the time of discharge, it was felt that a few days of prednisone would be helpful. Therefore, he was discharged on 3 more days of prednisone along with 5 more days of Ceftin for ongoing treatment. He was also given a prescription for albuterol nebulizer to be used 3 times a day for 5 more days and then every 4-6 hours thereafter. He was encouraged to wear his oxygen more consistently at home.  Acute on chronic respiratory failure with hypoxia. Patient is treated with 2-3 L of nasal cannula oxygen. His oxygen saturations were lower than his normal range on admission. Oxygen was titrated to keep his oxygen saturations 90% or greater. His wife stated that the patient did not wear his oxygen 24/7. Patient was encouraged to wear his oxygen close 24 hours a day 7 days a week as possible. He voiced understanding.  Chronic oxygen dependent COPD. He was continued on Symbicort. There was no significant wheezing on admission, but he did have occasional wheezes prior to discharge, therefore, he was discharged on a very short prednisone taper.   Chronic diastolic heart failure. His heart failure was compensated. He was continued on Lasix.       12/21/2015 1st [facility] office visit/  Courtnie Brenes   Chief Complaint  Patient presents with  . HFU    Pt states that his breathing has improved some, but not baseline for him. He c/o chest congestion and has had some cough with clear sputum. He has been wheezing some. He is  using albuterol inhaler 4-5 x per day and neb 2-4 x per day.   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  Plan B= Backup Only use your albuterol as a rescue medication to  Plan C = crisis Only use nebulizer if you try the ventolin first and it doesn't work  Try prilosec otc   Take 30-60 min before first meal of the day and Pepcid ac (famotidine) 20 mg one @  bedtime until cough is completely gone for at least a week without the need for cough suppression GERD diet      01/17/2016  f/u ov/[facility] Lad re:  GOLD III copd/ maint on symbicort 160 when he can afford it  Chief Complaint  Patient presents with  . Follow-up    Breathing continues to improve. He is coughing less. Sometimes prod in the am's with clear to yellow sputum.   doe = MMRC1 = can walk nl pace, flat grade, can't hurry or go uphills or steps s sob   Rare need for saba / almost no neb use at all/ main challenge is paying for care  rec  No need for 02 at rest just use at bedtime and with activity    04/16/2016  f/u ov/[place] [facility] re: GOLD III copd with restrictive change from obesity / symb 160 2bid but hfa poor  Chief Complaint  Patient presents with  . Follow-up    Pt c/o cough with clear-yellow mucus, wheeze, SOB and some chest tightness. Pt did [place] yesterday, did not fo under water but received some scrapes and bruises but otherwise is ok.   4lpm  pulsed  walking /2.5 sleeping and prn at rest  Doe x MMRC2 = can't walk a nl pace on a flat grade s sob but does fine slow and flat eg grocery shopping  avg saba once a day hfa and neb same / better when has access to symbicort  rec Work on inhaler technique:   Plan A = Automatic = Symbicort 160 Take 2 puffs first thing in am  and then another 2 puffs about 12 hours later.  Plan B = Backup Only use your albuterol as a rescue medication Plan C = Crisis - only use your albuterol nebulizer if you first try Plan B and it fails to help > ok to use the nebulizer up to every 4 hours but if start needing it regularly call for immediate appointment 02 2.5 lpm at bedtime, pulsed at 4 lpm with activity/ no 02 at rest    05/28/2016  f/u ov/Hollyann Pablo re: GOLD III copd  symbicort 160 2bid/ 02 with ex and hs  Chief Complaint  Patient presents with  . Follow-up    Breathing worse here recently due to increased humidity. Cough is unchanged. He is using rescue inhaler at least 3 x per day and neb 2 x every night.  slowed down by L Fem nerve burning/ heat, but does fine on 02 with indoor walking  rec Plan A = Automatic =  Try Bevespi Take 2 puffs first thing in am and then another 2 puffs about 12 hours later. Plan B = Backup Only use your albuterol as a rescue medication  Plan C = Crisis - only use your albuterol nebulizer if you first try Plan B and it fails to help > ok to use the nebulizer up to every 4 hours but if start needing it regularly call for immediate appointment Please schedule a follow up visit in 3 months but call sooner if needed     09/03/2016  f/u ov/Emalene Welte re:  symbicort 160 2bid/ 02 2.5 lpm hs/ ex on 2lpm Chief Complaint  Patient presents with  . Follow-up    Pt. states he likes being on the Bevespi still using the sample that he was given, Pt. states this week he doing a little better, coughing in the am, some wheezing  needing albuterol 1-2 x daily which is less than previous experience   No obvious day to day or daytime variability or assoc excess/ purulent sputum or mucus plugs or hemoptysis or cp or chest tightness, subjective wheeze or overt sinus or hb symptoms. No unusual exp hx or h/o childhood pna/ asthma or knowledge of premature birth.  Sleeping ok without nocturnal  or early am exacerbation  of  respiratory  c/o's or need for noct saba. Also denies any obvious fluctuation of symptoms with weather or environmental changes or other aggravating or alleviating factors except as outlined above   Current Medications, Allergies, Complete Past Medical History, Past Surgical History, Family History, and Social History were reviewed in [facility] electronic medical record.  ROS  The following are not active complaints unless bolded sore throat, dysphagia, dental problems, itching, sneezing,  nasal congestion or excess/ purulent secretions, ear ache,   fever, chills, sweats, unintended wt loss, classically pleuritic or exertional cp,  orthopnea pnd or leg swelling, presyncope, palpitations, abdominal pain, anorexia, nausea, vomiting, diarrhea  or change in bowel or bladder habits, change in stools or urine, dysuria,hematuria,  rash, arthralgias, visual complaints, headache, numbness, weakness or ataxia or problems with walking or coordination,  change in mood/affect or memory.                Objective:   Physical Exam    obese amb wm    04/16/2016          309 >  05/28/2016  305 > 09/03/2016   318  01/17/2016          311   12/20/15 308 lb (139.708 kg)  12/13/15 315 lb (142.883 kg)  12/08/15 312 lb (141.522 kg)    Vital signs reviewed  - sats 99% RA on arrival    HEENT: nl dentition, turbinates, and oropharynx. Nl external ear canals without cough reflex   NECK :  without JVD/Nodes/TM/ nl carotid upstrokes bilaterally   LUNGS: no acc muscle use,  Nl contour chest / distant BS bilaterally s wheeze    CV:  RRR  no s3 or murmur or increase in P2, no edema   ABD:  soft and nontender with nl inspiratory excursion in the supine position. No bruits or organomegaly, bowel sounds nl  MS:  Nl gait/ ext warm without deformities, calf tenderness, cyanosis or clubbing No obvious joint restrictions   SKIN: warm and dry without lesions    NEURO:  alert,  approp, nl sensorium with  no motor  deficits    CXR PA and Lateral:   01/17/2016 :    I personally reviewed images and agree with radiology impression as follows:   The lungs remain clear but hyperaerated consistent with and element of emphysema with flattened hemidiaphragms. Mediastinal and hilar contours are unremarkable. Heart size is stable being mildly enlarged. No acute bony abnormality is seen.      Assessment:

## 2016-09-03 NOTE — Patient Instructions (Addendum)
Plan A = Automatic = Bevespi(or symbicort)  Take 2 puffs first thing in am and then another 2 puffs about 12 hours later.    Plan B = Backup Only use your albuterol as a rescue medication to be used if you can't catch your breath by resting or doing a relaxed purse lip breathing pattern.  - The less you use it, the better it will work when you need it. - Ok to use the inhaler up to 2 puffs  every 4 hours if you must but call for appointment if use goes up over your usual need - Don't leave home without it !!  (think of it like the spare tire for your car)   Plan C = Crisis - only use your albuterol nebulizer if you first try Plan B and it fails to help > ok to use the nebulizer up to every 4 hours but if start needing it regularly call for immediate appointment  02 2.5 lpm at bedtime, pulsed at 4 lpm with activity/ no 02 at rest  Please schedule a follow up office visit on Dec 11 2016

## 2016-09-04 LAB — HEMOGLOBIN A1C
HEMOGLOBIN A1C: 6 % — AB (ref <5.7)
Mean Plasma Glucose: 126 mg/dL

## 2016-09-04 LAB — PSA: PSA: 0.4 ng/mL (ref <=4.0)

## 2016-09-04 LAB — HEPATITIS C ANTIBODY: HCV Ab: NEGATIVE

## 2016-09-05 NOTE — Assessment & Plan Note (Signed)
12/20/2015  extensive coaching HFA effectiveness =    75% > continue symbicort 160 2bid - Spirometry 01/17/2016  FEV1 1.62 (44%)  Ratio 61 - 05/28/2016  After extensive coaching HFA effectiveness =    90% try bevespi 2bid > improved 09/03/2016   Improved but facing issues of cost/ access to meds  I had an extended discussion with the patient reviewing all relevant studies completed to date and  lasting 15 to 20 minutes of a 25 minute visit    Formulary restrictions will be an ongoing challenge for the forseable future and I would be happy to pick an alternative if the pt will first  provide me a list of them but pt  will need to return here for training for any new device that is required eg dpi vs hfa vs respimat.    In meantime we can always provide samples so the patient never runs out of any needed respiratory medications.   Each maintenance medication was reviewed in detail including most importantly the difference between maintenance and prns and under what circumstances the prns are to be triggered using an action plan format that is not reflected in the computer generated alphabetically organized AVS.    Please see instructions for details which were reviewed in writing and the patient given a copy highlighting the part that I personally wrote and discussed at today's ov.

## 2016-09-10 IMAGING — DX DG CHEST 2V
2 series · 2 of 2 positions shown · non-contrast
Comparison: Radiograph 11/11/2015

CLINICAL DATA: Followup respiratory failure

EXAM:
CHEST  2 VIEW

[chest lat]
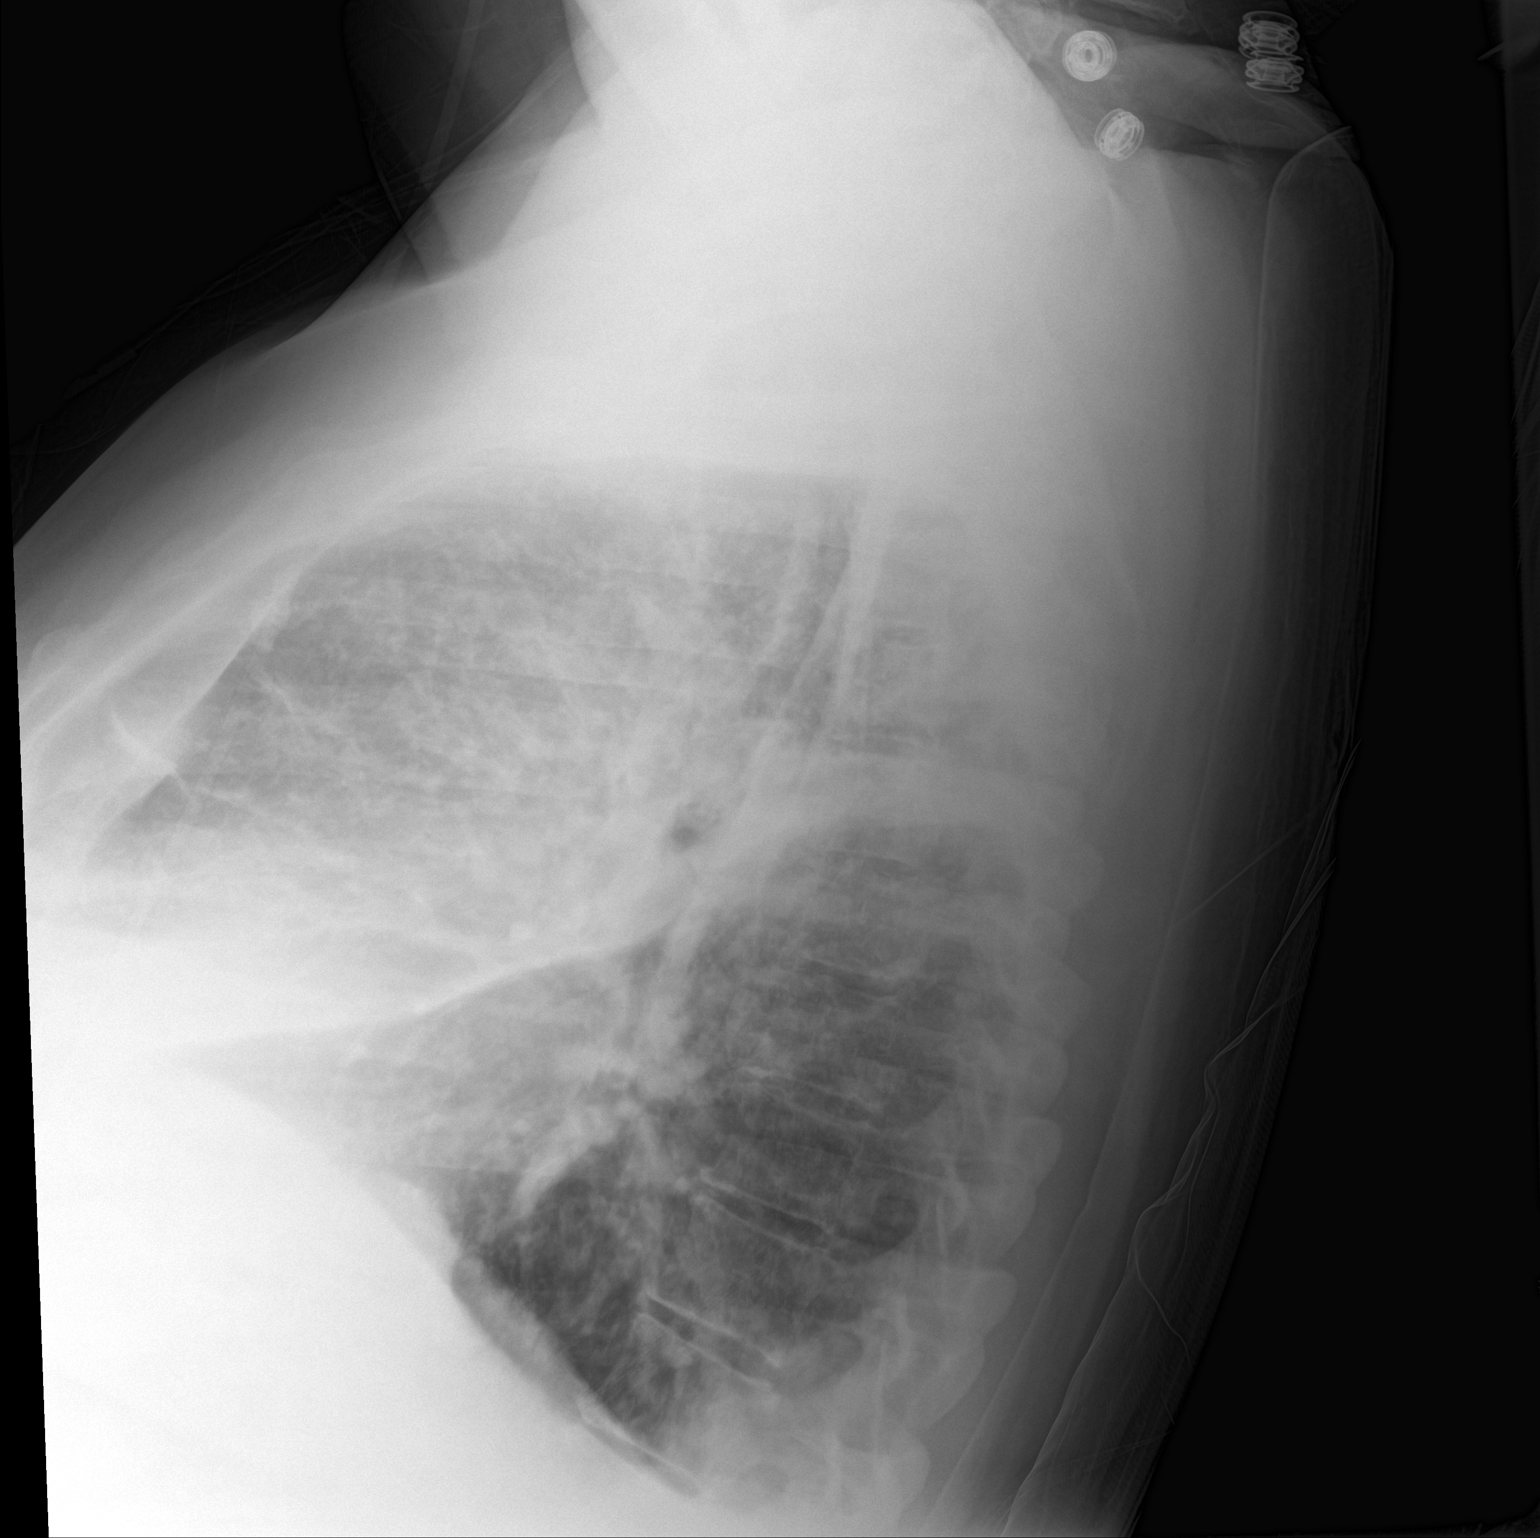

[chest ap]
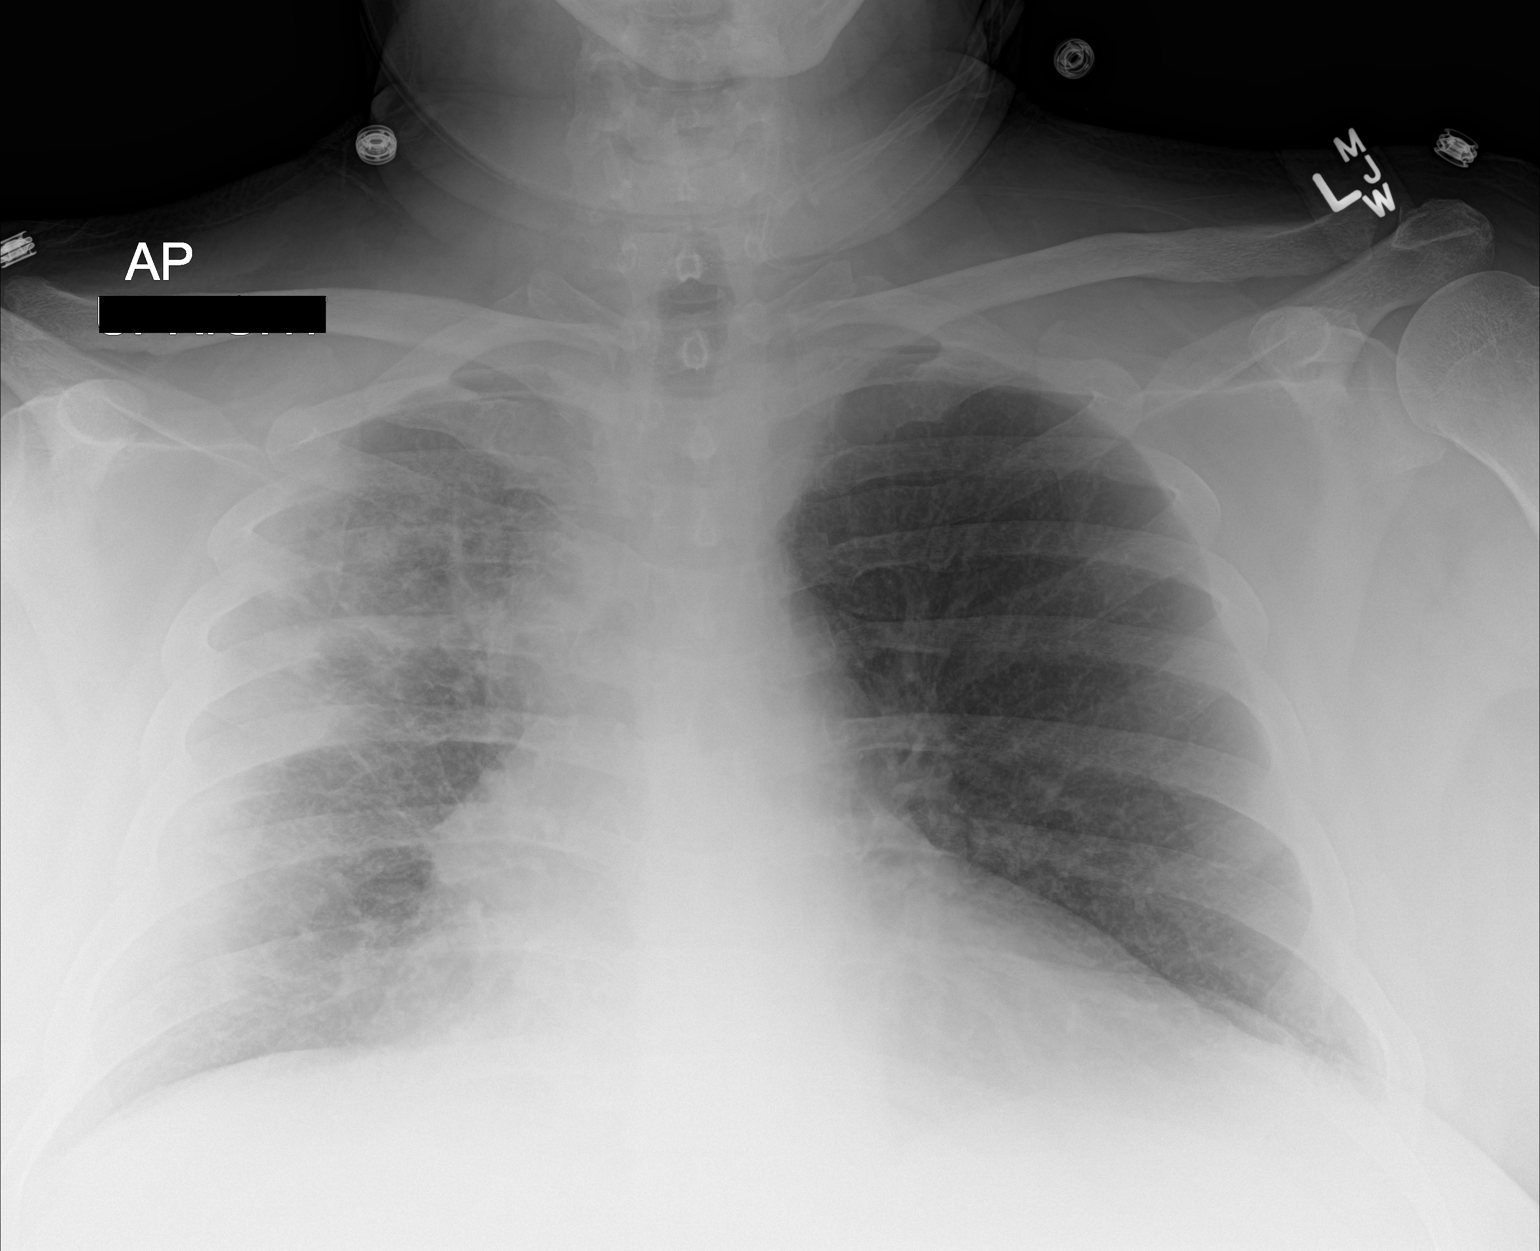

[2 of 2 positions shown; findings below may reference images not displayed]

FINDINGS: Stable enlarged cardiac silhouette. Post extubation. Improvement in
basilar atelectasis. There is volume loss in the RIGHT hemi thorax
unchanged from prior. There is improvement in the airspace disease
in the RIGHT lung compared to 11/11/2015. LEFT lung is clear.
IMPRESSION: 1. Improvement in aeration to the RIGHT lung with persistent volume
loss.
2. Post extubation without complication.

## 2016-10-23 MED FILL — SYMBICORT 80-4.5 MCG INH: 80-4.5 | 30 days supply | Qty: 10 | Fill #3

## 2016-10-23 MED FILL — VENTOLIN HFA 90 MCG INHALER: 108 (90 BAS | 25 days supply | Qty: 18 | Fill #2

## 2016-10-23 MED FILL — ALBUTEROL SUL 2.5 MG/3 ML S: (2.5 MG/3ML | 30 days supply | Qty: 90 | Fill #6

## 2016-10-26 ENCOUNTER — Ambulatory Visit: Payer: Self-pay | Attending: Internal Medicine

## 2016-10-26 ENCOUNTER — Other Ambulatory Visit: Payer: Self-pay | Admitting: Family Medicine

## 2016-10-26 MED ORDER — ALPRAZOLAM 0.25 MG PO TABS: 0.2500 mg | ORAL_TABLET | Freq: Two times a day (BID) | ORAL | 0 refills | Status: DC | PRN

## 2016-11-15 ENCOUNTER — Telehealth: Payer: Self-pay

## 2016-11-15 ENCOUNTER — Ambulatory Visit (HOSPITAL_COMMUNITY)
Admission: RE | Admit: 2016-11-15 | Discharge: 2016-11-15 | Disposition: A | Discharge status: Home / Self Care | Payer: Self-pay | Source: Ambulatory Visit | Attending: Family Medicine | Admitting: Family Medicine

## 2016-11-15 ENCOUNTER — Ambulatory Visit (INDEPENDENT_AMBULATORY_CARE_PROVIDER_SITE_OTHER): Payer: Self-pay | Admitting: Family Medicine

## 2016-11-15 ENCOUNTER — Encounter: Payer: Self-pay | Admitting: Family Medicine

## 2016-11-15 VITALS — BP 149/78 | Temp 98.2°F | Resp 20 | Ht 68.0 in | Wt 322.0 lb

## 2016-11-15 DIAGNOSIS — J9611 Chronic respiratory failure with hypoxia: Secondary | ICD-10-CM

## 2016-11-15 DIAGNOSIS — R062 Wheezing: Secondary | ICD-10-CM

## 2016-11-15 DIAGNOSIS — J9612 Chronic respiratory failure with hypercapnia: Secondary | ICD-10-CM

## 2016-11-15 DIAGNOSIS — R05 Cough: Secondary | ICD-10-CM

## 2016-11-15 DIAGNOSIS — Z9981 Dependence on supplemental oxygen: Secondary | ICD-10-CM

## 2016-11-15 DIAGNOSIS — IMO0001 Reserved for inherently not codable concepts without codable children: Secondary | ICD-10-CM

## 2016-11-15 DIAGNOSIS — E6609 Other obesity due to excess calories: Secondary | ICD-10-CM

## 2016-11-15 DIAGNOSIS — J069 Acute upper respiratory infection, unspecified: Secondary | ICD-10-CM

## 2016-11-15 DIAGNOSIS — R059 Cough, unspecified: Secondary | ICD-10-CM

## 2016-11-15 LAB — CBC WITH DIFFERENTIAL/PLATELET
BASOS ABS: 0 {cells}/uL (ref 0–200)
Basophils Relative: 0 %
EOS PCT: 3 %
Eosinophils Absolute: 342 cells/uL (ref 15–500)
HEMATOCRIT: 45.3 % (ref 38.5–50.0)
HEMOGLOBIN: 14.4 g/dL (ref 13.2–17.1)
LYMPHS ABS: 1824 {cells}/uL (ref 850–3900)
Lymphocytes Relative: 16 %
MCH: 27.5 pg (ref 27.0–33.0)
MCHC: 31.8 g/dL — AB (ref 32.0–36.0)
MCV: 86.6 fL (ref 80.0–100.0)
MONO ABS: 684 {cells}/uL (ref 200–950)
MPV: 10 fL (ref 7.5–12.5)
Monocytes Relative: 6 %
NEUTROS PCT: 75 %
Neutro Abs: 8550 cells/uL — ABNORMAL HIGH (ref 1500–7800)
Platelets: 339 10*3/uL (ref 140–400)
RBC: 5.23 MIL/uL (ref 4.20–5.80)
RDW: 15.2 % — ABNORMAL HIGH (ref 11.0–15.0)
WBC: 11.4 10*3/uL — ABNORMAL HIGH (ref 3.8–10.8)

## 2016-11-15 LAB — COMPLETE METABOLIC PANEL WITH GFR
ALBUMIN: 4 g/dL (ref 3.6–5.1)
ALK PHOS: 73 U/L (ref 40–115)
ALT: 12 U/L (ref 9–46)
AST: 16 U/L (ref 10–35)
BUN: 18 mg/dL (ref 7–25)
CALCIUM: 9.2 mg/dL (ref 8.6–10.3)
CO2: 29 mmol/L (ref 20–31)
Chloride: 100 mmol/L (ref 98–110)
Creat: 0.91 mg/dL (ref 0.70–1.33)
Glucose, Bld: 100 mg/dL — ABNORMAL HIGH (ref 65–99)
POTASSIUM: 4.6 mmol/L (ref 3.5–5.3)
Sodium: 138 mmol/L (ref 135–146)
Total Bilirubin: 0.3 mg/dL (ref 0.2–1.2)
Total Protein: 7 g/dL (ref 6.1–8.1)

## 2016-11-15 MED ORDER — DM-GUAIFENESIN ER 30-600 MG PO TB12: 1.0000 | ORAL_TABLET | Freq: Two times a day (BID) | ORAL | 0 refills | Status: DC

## 2016-11-15 MED ORDER — AMOXICILLIN-POT CLAVULANATE 875-125 MG PO TABS: 1.0000 | ORAL_TABLET | Freq: Two times a day (BID) | ORAL | 0 refills | Status: DC

## 2016-11-15 NOTE — Patient Instructions (Addendum)
[facility] Will call with xray results Will call prescription in to [facility] Increase water intake to 6-8 glasses Use inhalers as prescribed by Dr. Sherene SiresWert, pulmonologist Shortness of Breath Shortness of breath means you have trouble breathing. Shortness of breath needs medical care right away. HOME CARE   Do not smoke.  Avoid being around chemicals or things (paint fumes, dust) that may bother your breathing.  Rest as needed. Slowly begin your normal activities.  Only take medicines as told by your doctor.  Keep all doctor visits as told. GET HELP RIGHT AWAY IF:   Your shortness of breath gets worse.  You feel lightheaded, pass out (faint), or have a cough that is not helped by medicine.  You cough up blood.  You have pain with breathing.  You have pain in your chest, arms, shoulders, or belly (abdomen).  You have a fever.  You cannot walk up stairs or exercise the way you normally do.  You do not get better in the time expected.  You have a hard time doing normal activities even with rest.  You have problems with your medicines.  You have any new symptoms. MAKE SURE YOU:  Understand these instructions.  Will watch your condition.  Will get help right away if you are not doing well or get worse. This information is not intended to replace advice given to you by your health care provider. Make sure you discuss any questions you have with your health care provider. Document Released: 05/14/2008 Document Revised: 12/01/2013 Document Reviewed: 02/11/2012 Elsevier Interactive Patient Education  2017 [employer]vier Inc. Chronic Obstructive Pulmonary Disease Chronic obstructive pulmonary disease (COPD) is a common lung condition in which airflow from the lungs is limited. COPD is a general term that can be used to describe many different lung problems that limit airflow, including both chronic bronchitis and emphysema. If you have COPD, your lung  function will probably never return to normal, but there are measures you can take to improve lung function and make yourself feel better. What are the causes?  Smoking (common).  Exposure to secondhand smoke.  Genetic problems.  Chronic inflammatory lung diseases or recurrent infections. What are the signs or symptoms?  Shortness of breath, especially with physical activity.  Deep, persistent (chronic) cough with a large amount of thick mucus.  Wheezing.  Rapid breaths (tachypnea).  Gray or bluish discoloration (cyanosis) of the skin, especially in your fingers, toes, or lips.  Fatigue.  Weight loss.  Frequent infections or episodes when breathing symptoms become much worse (exacerbations).  Chest tightness. How is this diagnosed? Your health care provider will take a medical history and perform a physical examination to diagnose COPD. Additional tests for COPD may include:  Lung (pulmonary) function tests.  Chest X-ray.  CT scan.  Blood tests. How is this treated? Treatment for COPD may include:  Inhaler and nebulizer medicines. These help manage the symptoms of COPD and make your breathing more comfortable.  Supplemental oxygen. Supplemental oxygen is only helpful if you have a low oxygen level in your blood.  Exercise and physical activity. These are beneficial for nearly all people with COPD.  Lung surgery or transplant.  Nutrition therapy to gain weight, if you are underweight.  Pulmonary rehabilitation. This may involve working with a team of health care providers and specialists, such as respiratory, occupational, and physical therapists. Follow these instructions at home:  Take all medicines (inhaled or pills) as directed by your health care provider.  Avoid over-the-counter medicines or cough syrups that dry up your airway (such as antihistamines) and slow down the elimination of secretions unless instructed otherwise by your health care  provider.  If you are a smoker, the most important thing that you can do is stop smoking. Continuing to smoke will cause further lung damage and breathing trouble. Ask your health care provider for help with quitting smoking. He or she can direct you to community resources or hospitals that provide support.  Avoid exposure to irritants such as smoke, chemicals, and fumes that aggravate your breathing.  Use oxygen therapy and pulmonary rehabilitation if directed by your health care provider. If you require home oxygen therapy, ask your health care provider whether you should purchase a pulse oximeter to measure your oxygen level at home.  Avoid contact with individuals who have a contagious illness.  Avoid extreme temperature and humidity changes.  Eat healthy foods. Eating smaller, more frequent meals and resting before meals may help you maintain your strength.  Stay active, but balance activity with periods of rest. Exercise and physical activity will help you maintain your ability to do things you want to do.  Preventing infection and hospitalization is very important when you have COPD. Make sure to receive all the vaccines your health care provider recommends, especially the pneumococcal and influenza vaccines. Ask your health care provider whether you need a pneumonia vaccine.  Learn and use relaxation techniques to manage stress.  Learn and use controlled breathing techniques as directed by your health care provider. Controlled breathing techniques include: 1. Pursed lip breathing. Start by breathing in (inhaling) through your nose for 1 second. Then, purse your lips as if you were going to whistle and breathe out (exhale) through the pursed lips for 2 seconds. 2. Diaphragmatic breathing. Start by putting one hand on your abdomen just above your waist. Inhale slowly through your nose. The hand on your abdomen should move out. Then purse your lips and exhale slowly. You should be able to  feel the hand on your abdomen moving in as you exhale.  Learn and use controlled coughing to clear mucus from your lungs. Controlled coughing is a series of short, progressive coughs. The steps of controlled coughing are: 1. Lean your head slightly forward. 2. Breathe in deeply using diaphragmatic breathing. 3. Try to hold your breath for 3 seconds. 4. Keep your mouth slightly open while coughing twice. 5. Spit any mucus out into a tissue. 6. Rest and repeat the steps once or twice as needed. Contact a health care provider if:  You are coughing up more mucus than usual.  There is a change in the color or thickness of your mucus.  Your breathing is more labored than usual.  Your breathing is faster than usual. Get help right away if:  You have shortness of breath while you are resting.  You have shortness of breath that prevents you from:  Being able to talk.  Performing your usual physical activities.  You have chest pain lasting longer than 5 minutes.  Your skin color is more cyanotic than usual.  You measure low oxygen saturations for longer than 5 minutes with a pulse oximeter. This information is not intended to replace advice given to you by your health care provider. Make sure you discuss any questions you have with your health care provider. Document Released: 09/05/2005 Document Revised: 05/03/2016 Document Reviewed: 07/23/2013 Elsevier Interactive Patient Education  2017 [employer]

## 2016-11-15 NOTE — Telephone Encounter (Signed)
-----   Message from Massie MaroonLachina M Hollis, [place] sent at 11/15/2016 10:22 AM EST ----- Regarding: xray results Please inform [name] that his chest xray was unremarkable, no pneumonia or bronchitis. Patient has upper respiratory infection. Will start Augmentin (antibiotic) twice daily for 10 days. Also, sent Mucinex DM to pharmacy. Increase water intake, rest, handwashing, and vitamin C intake. Call office if symptoms worsen.   Thanks ----- Message ----- From: Interface, Rad Results In Sent: 11/15/2016   9:50 AM To: Massie MaroonLachina M Hollis, FNP

## 2016-11-15 NOTE — Telephone Encounter (Signed)
Called and spoke with patient. Advised of xray results and that he has upper respiratory infection and needs to take Augmentin  Twice daily as directed until finished. Advised patient to increase water and vitamin C intake and to rest and practice good handwashing. Patient verbalized understanding. Thanks!

## 2016-11-15 NOTE — Progress Notes (Signed)
Subjective:    Patient ID: Kenneth Casey, male    DOB: 10/30/1956, 60 y.o.   MRN: [id number]  Cough  This is a recurrent problem. The current episode started 1 to 4 weeks ago. The problem has been gradually improving. The problem occurs constantly. The cough is productive of blood-tinged sputum. Associated symptoms include shortness of breath (with exertion) and wheezing. Pertinent negatives include no chest pain, chills or fever. The symptoms are aggravated by cold air and exercise. He has tried nothing for the symptoms. The treatment provided no relief. His past medical history is significant for COPD and environmental allergies.  Wheezing   This is a recurrent problem. The current episode started 1 to 4 weeks ago. The problem occurs constantly. The problem has been gradually worsening. Associated symptoms include coughing and shortness of breath (with exertion). Pertinent negatives include no chest pain, chills, diarrhea, fever or vomiting. Nothing aggravates the symptoms. He has tried nothing for the symptoms. His past medical history is significant for COPD.   Past Medical History:  Diagnosis Date  . COPD (chronic obstructive pulmonary disease) (HCC)   . Obese   . Pneumonia    Social History   Social History  . Marital status: Married    Spouse name: N/A  . Number of children: N/A  . Years of education: N/A   Occupational History  . Not on file.   Social History Main Topics  . Smoking status: Former Smoker    Packs/day: 2.00    Years: 30.00    Types: Cigarettes    Quit date: 01/08/2011  . Smokeless tobacco: Never Used  . Alcohol use No  . Drug use: Unknown  . Sexual activity: Not on file   Other Topics Concern  . Not on file   Social History Narrative  . No narrative on file   Immunization History  Administered Date(s) Administered  . Influenza Split 09/10/2015  . Influenza,inj,Quad PF,36+ Mos 09/03/2016    Review of Systems  Constitutional: Negative for chills,  fatigue and fever.  HENT: Positive for congestion.   Respiratory: Positive for cough, shortness of breath (with exertion) and wheezing. Negative for choking.   Cardiovascular: Negative.  Negative for chest pain, palpitations and leg swelling.  Gastrointestinal: Negative for constipation, diarrhea, nausea, rectal pain and vomiting.  Endocrine: Negative for polydipsia, polyphagia and polyuria.  Genitourinary: Negative.  Negative for difficulty urinating.  Skin: Negative.   Allergic/Immunologic: Positive for environmental allergies. Negative for immunocompromised state.  Neurological: Negative.  Negative for dizziness and light-headedness.  Hematological: Negative.   Psychiatric/Behavioral: Negative.        Objective:   Physical Exam  Constitutional: He is oriented to person, place, and time.  Morbid obesity  HENT:  Head: Normocephalic and atraumatic.  Right Ear: Hearing, tympanic membrane, external ear and ear canal normal.  Left Ear: Hearing, tympanic membrane, external ear and ear canal normal.  Mouth/Throat: Uvula is midline and oropharynx is clear and moist.  Eyes: Conjunctivae and EOM are normal. Pupils are equal, round, and reactive to light.  Neck: Normal range of motion. Neck supple.  Cardiovascular: Regular rhythm, S1 normal and S2 normal.   Pulmonary/Chest: Effort normal. No apnea. No respiratory distress. He has wheezes. He has rhonchi in the right upper field, the right middle field, the left middle field and the left lower field. He exhibits no tenderness.  Abdominal: Soft. Bowel sounds are normal.  Increased abdominal girth (pendulous)  Musculoskeletal: Normal range of motion.  Neurological: He is  alert and oriented to person, place, and time. No cranial nerve deficit or sensory deficit.  Skin: Skin is warm and dry.  Psychiatric: He has a normal mood and affect. His behavior is normal. Judgment and thought content normal.      BP (!) 149/78 (BP Location: Right Arm,  Patient Position: Sitting, Cuff Size: Large)   Temp 98.2 F (36.8 C) (Oral)   Resp 20   Ht 5\' 8"  (1.727 m)   Wt (!) 322 lb (146.1 kg)   SpO2 96%   BMI 48.96 kg/m  Assessment & Plan:  1. Wheezing  - DG Chest 2 View; Future  2. On home oxygen therapy Pulse oximetry on room air is 96%. Patient continues on 2 liters of oxygen at home per Dr Sherene SiresWert,  Pulmonologist.   3. Chronic respiratory failure with hypoxia and hypercapnia (HCC) Will notify patient with chest xray results. Continue Symbicort daily and albuterol rescue inhaler as needed.  - CBC with Differential - COMPLETE METABOLIC PANEL WITH GFR  4. Class 3 obesity due to excess calories with serious comorbidity in adult, unspecified BMI The patient is asked to make an attempt to improve diet and exercise patterns to aid in medical management of this problem.  5. Acute upper respiratory infection - amoxicillin-clavulanate (AUGMENTIN) 875-125 MG tablet; Take 1 tablet by mouth 2 (two) times daily.  Dispense: 20 tablet; Refill: 0 - dextromethorphan-guaiFENesin (MUCINEX DM) 30-600 MG 12hr tablet; Take 1 tablet by mouth 2 (two) times daily.  Dispense: 30 tablet; Refill: 0   Kenneth Casey M, FNP  RTC: 1 month  The patient was given clear instructions to go to ER or return to medical center if symptoms do not improve, worsen or new problems develop. The patient verbalized understanding..Marland Kitchen

## 2016-11-22 ENCOUNTER — Telehealth: Payer: Self-pay

## 2016-11-22 DIAGNOSIS — F411 Generalized anxiety disorder: Secondary | ICD-10-CM

## 2016-11-22 MED ORDER — ALPRAZOLAM 0.25 MG PO TABS: 0.2500 mg | ORAL_TABLET | Freq: Three times a day (TID) | ORAL | 0 refills | Status: DC | PRN

## 2016-11-22 NOTE — Telephone Encounter (Signed)
Refill request for alprazolam. Please advise. Thanks!

## 2016-11-22 NOTE — Telephone Encounter (Signed)
Reviewed [place] Substance Reporting system prior to prescribing controlled substances, no inconsistencies noted.  Meds ordered this encounter  Medications  . ALPRAZolam (XANAX) 0.25 MG tablet    Sig: Take 1 tablet (0.25 mg total) by mouth 3 (three) times daily as needed for anxiety.    Dispense:  30 tablet    Refill:  0    Order Specific Question:   Supervising Provider    Answer:   [name], [name] [[id number]   Massie MaroonHollis,Aidenjames Heckmann M, FNP

## 2016-11-30 ENCOUNTER — Other Ambulatory Visit: Payer: Self-pay | Admitting: *Deleted

## 2016-11-30 MED ORDER — BUDESONIDE-FORMOTEROL FUMARATE 80-4.5 MCG/ACT IN AERO: 2.0000 | INHALATION_SPRAY | Freq: Two times a day (BID) | RESPIRATORY_TRACT | 3 refills | Status: DC

## 2016-11-30 MED ORDER — ALBUTEROL SULFATE HFA 108 (90 BASE) MCG/ACT IN AERS: 2.0000 | INHALATION_SPRAY | Freq: Four times a day (QID) | RESPIRATORY_TRACT | 3 refills | Status: DC | PRN

## 2016-11-30 NOTE — Telephone Encounter (Signed)
PRINTED FOR PASS PROGRAM 

## 2016-12-11 ENCOUNTER — Ambulatory Visit: Payer: Self-pay | Admitting: Internal Medicine

## 2016-12-11 ENCOUNTER — Ambulatory Visit: Payer: Self-pay | Admitting: Family Medicine

## 2016-12-12 ENCOUNTER — Ambulatory Visit: Payer: Self-pay | Admitting: Internal Medicine

## 2016-12-13 ENCOUNTER — Ambulatory Visit: Payer: Self-pay | Admitting: Family Medicine

## 2016-12-13 ENCOUNTER — Ambulatory Visit: Payer: Self-pay | Admitting: Internal Medicine

## 2017-01-01 ENCOUNTER — Ambulatory Visit (INDEPENDENT_AMBULATORY_CARE_PROVIDER_SITE_OTHER): Payer: Self-pay | Admitting: Family Medicine

## 2017-01-01 ENCOUNTER — Encounter: Payer: Self-pay | Admitting: Family Medicine

## 2017-01-01 VITALS — BP 136/78 | HR 95 | Temp 98.0°F | Wt 322.2 lb

## 2017-01-01 DIAGNOSIS — N3001 Acute cystitis with hematuria: Secondary | ICD-10-CM

## 2017-01-01 DIAGNOSIS — R339 Retention of urine, unspecified: Secondary | ICD-10-CM

## 2017-01-01 DIAGNOSIS — R350 Frequency of micturition: Secondary | ICD-10-CM

## 2017-01-01 DIAGNOSIS — R310 Gross hematuria: Secondary | ICD-10-CM

## 2017-01-01 LAB — CBC WITH DIFFERENTIAL/PLATELET
BASOS PCT: 0 %
Basophils Absolute: 0 cells/uL (ref 0–200)
EOS PCT: 4 %
Eosinophils Absolute: 460 cells/uL (ref 15–500)
HCT: 44.7 % (ref 38.5–50.0)
Hemoglobin: 14.4 g/dL (ref 13.2–17.1)
Lymphocytes Relative: 16 %
Lymphs Abs: 1840 cells/uL (ref 850–3900)
MCH: 28 pg (ref 27.0–33.0)
MCHC: 32.2 g/dL (ref 32.0–36.0)
MCV: 86.8 fL (ref 80.0–100.0)
MONO ABS: 690 {cells}/uL (ref 200–950)
MONOS PCT: 6 %
MPV: 10.2 fL (ref 7.5–12.5)
Neutro Abs: 8510 cells/uL — ABNORMAL HIGH (ref 1500–7800)
Neutrophils Relative %: 74 %
PLATELETS: 309 10*3/uL (ref 140–400)
RBC: 5.15 MIL/uL (ref 4.20–5.80)
RDW: 15 % (ref 11.0–15.0)
WBC: 11.5 10*3/uL — AB (ref 3.8–10.8)

## 2017-01-01 LAB — POCT GLYCOSYLATED HEMOGLOBIN (HGB A1C): HEMOGLOBIN A1C: 6

## 2017-01-01 LAB — BASIC METABOLIC PANEL
BUN: 25 mg/dL (ref 7–25)
CALCIUM: 9 mg/dL (ref 8.6–10.3)
CO2: 27 mmol/L (ref 20–31)
CREATININE: 0.88 mg/dL (ref 0.70–1.33)
Chloride: 101 mmol/L (ref 98–110)
GLUCOSE: 96 mg/dL (ref 65–99)
POTASSIUM: 4.4 mmol/L (ref 3.5–5.3)
Sodium: 140 mmol/L (ref 135–146)

## 2017-01-01 MED ORDER — SULFAMETHOXAZOLE-TRIMETHOPRIM 800-160 MG PO TABS: 1.0000 | ORAL_TABLET | Freq: Two times a day (BID) | ORAL | 0 refills | Status: DC

## 2017-01-01 NOTE — Patient Instructions (Addendum)
Acute Urinary Retention, Male  Acute urinary retention is the temporary inability to urinate.  This is a common problem in older men. As men age their prostates become larger and block the flow of urine from the bladder. This is usually a problem that has come on gradually.  Follow these instructions at home:  If you are sent home with a Foley catheter and a drainage system, you will need to discuss the best course of action with your health care provider. While the catheter is in, maintain a good intake of fluids. Keep the drainage bag emptied and lower than your catheter. This is so that contaminated urine will not flow back into your bladder, which could lead to a urinary tract infection.  There are two main types of drainage bags. One is a large bag that usually is used at night. It has a good capacity that will allow you to sleep through the night without having to empty it. The second type is called a leg bag. It has a smaller capacity, so it needs to be emptied more frequently. However, the main advantage is that it can be attached by a leg strap and can go underneath your clothing, allowing you the freedom to move about or leave your home.  Only take over-the-counter or prescription medicines for pain, discomfort, or fever as directed by your health care provider.  Contact a health care provider if:   You develop a low-grade fever.   You experience spasms or leakage of urine with the spasms.  Get help right away if:   You develop chills or fever.   Your catheter stops draining urine.   Your catheter falls out.   You start to develop increased bleeding that does not respond to rest and increased fluid intake.  This information is not intended to replace advice given to you by your health care provider. Make sure you discuss any questions you have with your health care provider.  Document Released: 03/04/2001 Document Revised: 05/09/2016 Document Reviewed: 05/07/2013  Elsevier Interactive Patient  Education  2017 [employer]

## 2017-01-01 NOTE — Progress Notes (Signed)
Subjective:    Patient ID: Kenneth Casey, male    DOB: 23-Sep-1956, 61 y.o.   MRN: [id number]  Urinary Tract Infection   This is a new problem. The current episode started in the past 7 days. The problem occurs every urination. The problem has been rapidly worsening. The quality of the pain is described as burning. The pain is at a severity of 5/10. The pain is mild. The maximum temperature recorded prior to his arrival was 101 - 101.9 F. The fever has been present for less than 1 day. He is not sexually active. There is no history of pyelonephritis. Associated symptoms include frequency and hematuria. Pertinent negatives include no chills, discharge, flank pain, possible pregnancy, sweats, urgency or vomiting. He has tried increased fluids for the symptoms. The treatment provided no relief. There is no history of catheterization, kidney stones, recurrent UTIs, a single kidney, urinary stasis or a urological procedure.   Past Medical History:  Diagnosis Date  . COPD (chronic obstructive pulmonary disease) (HCC)   . Obese   . Pneumonia    Social History   Social History  . Marital status: Married    Spouse name: N/A  . Number of children: N/A  . Years of education: N/A   Occupational History  . Not on file.   Social History Main Topics  . Smoking status: Former Smoker    Packs/day: 2.00    Years: 30.00    Types: Cigarettes    Quit date: 01/08/2011  . Smokeless tobacco: Never Used  . Alcohol use No  . Drug use: No  . Sexual activity: Not on file   Other Topics Concern  . Not on file   Social History Narrative  . No narrative on file   Review of Systems  Constitutional: Positive for fever. Negative for chills.  HENT: Negative.   Eyes: Negative.   Respiratory: Negative.   Cardiovascular: Negative.   Gastrointestinal: Negative.  Negative for vomiting.  Endocrine: Negative.   Genitourinary: Positive for decreased urine volume, difficulty urinating, dysuria, frequency and  hematuria. Negative for flank pain, scrotal swelling and urgency.  Musculoskeletal: Negative.   Skin: Negative.   Allergic/Immunologic: Negative.   Neurological: Negative.   Hematological: Negative.   Psychiatric/Behavioral: Negative.        Objective:   Physical Exam  Constitutional: He is oriented to person, place, and time. He appears well-developed and well-nourished.  HENT:  Head: Normocephalic and atraumatic.  Right Ear: External ear normal.  Left Ear: External ear normal.  Nose: Nose normal.  Mouth/Throat: Oropharynx is clear and moist.  Eyes: Conjunctivae and EOM are normal. Pupils are equal, round, and reactive to light.  Neck: Normal range of motion. Neck supple.  Cardiovascular: Normal rate, normal heart sounds and intact distal pulses.   Pulmonary/Chest: Effort normal and breath sounds normal.  Abdominal: Soft. Bowel sounds are normal.  Musculoskeletal: Normal range of motion.  Neurological: He is alert and oriented to person, place, and time.  Skin: Skin is warm and dry.   BP 136/78 (BP Location: Left Arm, Patient Position: Sitting, Cuff Size: Large)   Pulse 95   Temp 98 F (36.7 C) (Oral)   Wt (!) 322 lb 3.2 oz (146.1 kg)   SpO2 97% Comment: Without 02  BMI 48.99 kg/m     Assessment & Plan:  1. Acute cystitis with hematuria - sulfamethoxazole-trimethoprim (BACTRIM DS,SEPTRA DS) 800-160 MG tablet; Take 1 tablet by mouth 2 (two) times daily.  Dispense: 20 tablet; Refill:  0 - Urine culture  2. Gross hematuria - CBC with Differential - Urine culture  3. Urinary retention - Basic Metabolic Panel - Urine culture  4. Urinary frequency - Basic Metabolic Panel - HgB A1c  RTC: F/u in office after completing antibiotic   Massie MaroonHollis,Asmar Brozek M, FNP

## 2017-01-02 LAB — POCT URINALYSIS DIP (DEVICE)
Bilirubin Urine: NEGATIVE
Glucose, UA: NEGATIVE mg/dL
Ketones, ur: NEGATIVE mg/dL
Nitrite: NEGATIVE
PH: 8.5 — AB (ref 5.0–8.0)
PROTEIN: NEGATIVE mg/dL
Specific Gravity, Urine: 1.015 (ref 1.005–1.030)
Urobilinogen, UA: 0.2 mg/dL (ref 0.0–1.0)

## 2017-01-04 ENCOUNTER — Ambulatory Visit: Payer: Self-pay | Admitting: Family Medicine

## 2017-01-04 LAB — URINE CULTURE

## 2017-01-08 ENCOUNTER — Ambulatory Visit: Payer: Self-pay | Admitting: Family Medicine

## 2017-01-17 ENCOUNTER — Other Ambulatory Visit: Payer: Self-pay | Admitting: Family Medicine

## 2017-01-17 MED FILL — VENTOLIN HFA 90 MCG INHALER: 108 (90 BAS | 25 days supply | Qty: 18 | Fill #0

## 2017-01-21 ENCOUNTER — Telehealth: Payer: Self-pay

## 2017-01-21 DIAGNOSIS — F411 Generalized anxiety disorder: Secondary | ICD-10-CM

## 2017-01-21 MED ORDER — ALPRAZOLAM 0.25 MG PO TABS: 0.2500 mg | ORAL_TABLET | Freq: Three times a day (TID) | ORAL | 0 refills | Status: DC | PRN

## 2017-01-21 NOTE — Telephone Encounter (Signed)
Meds ordered this encounter  Medications  . ALPRAZolam (XANAX) 0.25 MG tablet    Sig: Take 1 tablet (0.25 mg total) by mouth 3 (three) times daily as needed for anxiety.    Dispense:  30 tablet    Refill:  0    Order Specific Question:   Supervising Provider    Answer:   [name], [name] [id number]

## 2017-01-29 ENCOUNTER — Ambulatory Visit: Payer: Self-pay | Attending: Internal Medicine

## 2017-01-29 ENCOUNTER — Other Ambulatory Visit: Payer: Self-pay

## 2017-01-29 MED ORDER — BUDESONIDE-FORMOTEROL FUMARATE 80-4.5 MCG/ACT IN AERO: 2.0000 | INHALATION_SPRAY | Freq: Two times a day (BID) | RESPIRATORY_TRACT | 3 refills | Status: DC

## 2017-01-29 MED ORDER — ALBUTEROL SULFATE HFA 108 (90 BASE) MCG/ACT IN AERS: 2.0000 | INHALATION_SPRAY | Freq: Four times a day (QID) | RESPIRATORY_TRACT | 3 refills | Status: DC | PRN

## 2017-02-13 ENCOUNTER — Encounter: Payer: Self-pay | Admitting: Family Medicine

## 2017-02-13 ENCOUNTER — Encounter: Payer: Self-pay | Admitting: Internal Medicine

## 2017-02-13 ENCOUNTER — Ambulatory Visit (INDEPENDENT_AMBULATORY_CARE_PROVIDER_SITE_OTHER): Payer: Self-pay | Admitting: Family Medicine

## 2017-02-13 ENCOUNTER — Ambulatory Visit (INDEPENDENT_AMBULATORY_CARE_PROVIDER_SITE_OTHER): Payer: Self-pay | Admitting: Internal Medicine

## 2017-02-13 VITALS — BP 126/84 | HR 94 | Ht 68.0 in | Wt 323.0 lb

## 2017-02-13 DIAGNOSIS — I5189 Other ill-defined heart diseases: Secondary | ICD-10-CM

## 2017-02-13 DIAGNOSIS — R071 Chest pain on breathing: Secondary | ICD-10-CM

## 2017-02-13 DIAGNOSIS — I519 Heart disease, unspecified: Secondary | ICD-10-CM

## 2017-02-13 DIAGNOSIS — J9611 Chronic respiratory failure with hypoxia: Secondary | ICD-10-CM

## 2017-02-13 DIAGNOSIS — J449 Chronic obstructive pulmonary disease, unspecified: Secondary | ICD-10-CM

## 2017-02-13 DIAGNOSIS — F411 Generalized anxiety disorder: Secondary | ICD-10-CM

## 2017-02-13 DIAGNOSIS — J9612 Chronic respiratory failure with hypercapnia: Secondary | ICD-10-CM

## 2017-02-13 DIAGNOSIS — Z9981 Dependence on supplemental oxygen: Secondary | ICD-10-CM

## 2017-02-13 DIAGNOSIS — R7303 Prediabetes: Secondary | ICD-10-CM

## 2017-02-13 LAB — HEMOGLOBIN A1C
HEMOGLOBIN A1C: 5.8 % — AB (ref <5.7)
MEAN PLASMA GLUCOSE: 120 mg/dL

## 2017-02-13 LAB — COMPLETE METABOLIC PANEL WITH GFR
ALBUMIN: 4 g/dL (ref 3.6–5.1)
ALT: 13 U/L (ref 9–46)
AST: 14 U/L (ref 10–35)
Alkaline Phosphatase: 73 U/L (ref 40–115)
BUN: 23 mg/dL (ref 7–25)
CHLORIDE: 100 mmol/L (ref 98–110)
CO2: 28 mmol/L (ref 20–31)
Calcium: 9.3 mg/dL (ref 8.6–10.3)
Creat: 0.88 mg/dL (ref 0.70–1.33)
GFR, Est African American: >89 mL/min (ref >=60)
GFR, Est Non African American: >89 mL/min (ref >=60)
GLUCOSE: 100 mg/dL — AB (ref 65–99)
POTASSIUM: 4.7 mmol/L (ref 3.5–5.3)
SODIUM: 138 mmol/L (ref 135–146)
Total Bilirubin: 0.4 mg/dL (ref 0.2–1.2)
Total Protein: 7.3 g/dL (ref 6.1–8.1)

## 2017-02-13 MED ORDER — BUSPIRONE HCL 5 MG PO TABS: 5.0000 mg | ORAL_TABLET | Freq: Two times a day (BID) | ORAL | 3 refills | Status: DC

## 2017-02-13 MED ORDER — ALPRAZOLAM 0.25 MG PO TABS: 0.2500 mg | ORAL_TABLET | Freq: Three times a day (TID) | ORAL | 0 refills | Status: DC | PRN

## 2017-02-13 MED ORDER — BUDESONIDE-FORMOTEROL FUMARATE 80-4.5 MCG/ACT IN AERO: 2.0000 | INHALATION_SPRAY | Freq: Two times a day (BID) | RESPIRATORY_TRACT | 1 refills | Status: DC

## 2017-02-13 MED FILL — !SYMBICORT 80-4.5 MCG INH: 80-4.5 | 25 days supply | Qty: 1 | Fill #0

## 2017-02-13 MED FILL — $VENTOLIN HFA 18G INHALER: 108 (90 BAS | 30 days supply | Qty: 18 | Fill #0

## 2017-02-13 NOTE — Patient Instructions (Addendum)
No change in medications or 0xygen regimen   Weight control is simply a matter of calorie balance which needs to be tilted in your favor by eating less and exercising more.  To get the most out of exercise, you need to be continuously aware that you are short of breath, but never out of breath, for 30 minutes daily. As you improve, it will actually be easier for you to do the same amount of exercise  in  30 minutes so always push to the level where you are short of breath.  If this does not result in gradual weight reduction then I strongly recommend you see a nutritionist with a food diary x 2 weeks so that we can work out a negative calorie balance which is universally effective in steady weight loss programs.  Think of your calorie balance like you do your bank account where in this case you want the balance to go down so you must take in less calories than you burn up.  It's just that simple:  Hard to do, but easy to understand.  Good luck!    Please schedule a follow up visit in 6 months but call sooner if needed

## 2017-02-13 NOTE — Progress Notes (Signed)
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 Healthcare associated pneumonia. 2. Acute on chronic respiratory failure with hypoxia. 3. COPD without exacerbation. 4. Chronic diastolic congestive heart failure. Remain compensated. 5. Morbid obesity. 6. Mild normocytic anemia, in part, hemodilutional. 7. Lactic acidosis.  Discharge Condition: Improved.  Diet recommendation: Heart healthy.  Filed Weights   12/13/15 1116 12/13/15 1457  Weight: 141.522 kg (312 lb) 142.883 kg (315 lb)    History of present illness:  The patient is a 61 year old man with history of oxygen dependent COPD. He was recently hospitalized in December for septic shock secondary to community-acquired pneumonia which required intubation. He had been doing fairly well at home until approximately 3 days prior to his presentation to the ED again on 12/12/2014. He complained of shortness of breath, coughing, runny nose, and a headache. In the ED, his chest x-ray revealed right middle lobe atelectasis or infiltrate/airspace disease. His lactic acid was 2.41. He was slightly more hypoxic than usual, oxygenating 88-90% on his usual 3 L of oxygen. He was admitted for further evaluation and management.  Hospital Course:   1. Healthcare associated pneumonia. The patient was recently hospitalized in December after a prolonged stay with ventilator dependent respiratory failure secondary to septic shock, pneumonia, and hypoxic respiratory failure. He returned on 12/13/15 with shortness of breath and cough. Chest x-ray in the ED revealed right middle lobe atelectasis  versus airspace disease. ABG on 2 L of oxygen on admission revealed a pH of 7.4, PCO2 of 46, and PO2 of 67. Cefepime and vancomycin were started. He was continued on oxygen. -Influenza was negative. HIV was nonreactive. Blood cultures were negative to date. Urine Legionella antigen was pending at the time of discharge. Strep will antigen was negative. -Patient improved clinically and symptomatically. His oxygen saturations improved progressively. He did not have significant wheezing, however, at the time of discharge, it was felt that a few days of prednisone would be helpful. Therefore, he was discharged on 3 more days of prednisone along with 5 more days of Ceftin for ongoing treatment. He was also given a prescription for albuterol nebulizer to be used 3 times a day for 5 more days and then every 4-6 hours thereafter. He was encouraged to wear his oxygen more consistently at home.  Acute on chronic respiratory failure with hypoxia. Patient is treated with 2-3 L of nasal cannula oxygen. His oxygen saturations were lower than his normal range on admission. Oxygen was titrated to keep his oxygen saturations 90% or greater. His wife stated that the patient did not wear his oxygen 24/7. Patient was encouraged to wear his oxygen close 24 hours a day 7 days a week as possible. He voiced understanding.  Chronic oxygen dependent COPD. He was continued on Symbicort. There was no significant wheezing on admission, but he did have occasional wheezes prior to discharge, therefore, he was discharged on a very short prednisone taper.   Chronic diastolic heart failure. His heart failure was compensated. He was continued on Lasix.       12/21/2015 1st [facility] office visit/  Beyla Loney   Chief Complaint  Patient presents with  . HFU    Pt states that his breathing has improved some, but not baseline for him. He c/o chest congestion and has had some cough with clear sputum. He has been wheezing some. He is  using albuterol inhaler 4-5 x per day and neb 2-4 x per day.   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  Plan B= Backup Only use your albuterol as a rescue medication to  Plan C = crisis Only use nebulizer if you try the ventolin first and it doesn't work  Try prilosec otc   Take 30-60 min before first meal of the day and Pepcid ac (famotidine) 20 mg one @  bedtime until cough is completely gone for at least a week without the need for cough suppression GERD diet      01/17/2016  f/u ov/Dyan Labarbera re:  GOLD III copd/ maint on symbicort 160 when he can afford it  Chief Complaint  Patient presents with  . Follow-up    Breathing continues to improve. He is coughing less. Sometimes prod in the am's with clear to yellow sputum.   doe = MMRC1 = can walk nl pace, flat grade, can't hurry or go uphills or steps s sob   Rare need for saba / almost no neb use at all/ main challenge is paying for care  rec  No need for 02 at rest just use at bedtime and with activity    04/16/2016  f/u ov/Mystery Schrupp re: GOLD III copd with restrictive change from obesity / symb 160 2bid but hfa poor  Chief Complaint  Patient presents with  . Follow-up    Pt c/o cough with clear-yellow mucus, wheeze, SOB and some chest tightness. Pt did [place] yesterday, did not fo under water but received some scrapes and bruises but otherwise is ok.   4lpm  pulsed  walking /2.5 sleeping and prn at rest  Doe x MMRC2 = can't walk a nl pace on a flat grade s sob but does fine slow and flat eg grocery shopping  avg saba once a day hfa and neb same / better when has access to symbicort  rec Work on inhaler technique:   Plan A = Automatic = Symbicort 160 Take 2 puffs first thing in am  and then another 2 puffs about 12 hours later.  Plan B = Backup Only use your albuterol as a rescue medication Plan C = Crisis - only use your albuterol nebulizer if you first try Plan B and it fails to help > ok to use the nebulizer up to every 4 hours but if start needing it regularly call for immediate appointment 02 2.5 lpm at bedtime, pulsed at 4 lpm with activity/ no 02 at rest    05/28/2016  f/u ov/[name] re: GOLD III copd  symbicort 160 2bid/ 02 with ex and hs  Chief Complaint  Patient presents with  . Follow-up    Breathing worse here recently due to increased humidity. Cough is unchanged. He is using rescue inhaler at least 3 x per day and neb 2 x every night.  slowed down by L Fem nerve burning/ heat, but does fine on 02 with indoor walking  rec Plan A = Automatic =  Try Bevespi Take 2 puffs first thing in am and then another 2 puffs about 12 hours later. Plan B = Backup Only use your albuterol as a rescue medication  Plan C = Crisis - only use your albuterol nebulizer if you first try Plan B and it fails to help > ok to use the nebulizer up to every 4 hours but if start needing it regularly call for immediate appointment Please schedule a follow up visit in 3 months but call sooner if needed     09/03/2016  f/u ov/Kollin Udell re:  symbicort 160 2bid/ 02 2.5 lpm hs/ ex on 2lpm Chief Complaint  Patient presents with  . Follow-up    Pt. states he likes being on the Bevespi still using the sample that he was given, Pt. states this week he doing a little better, coughing in the am, some wheezing  needing albuterol 1-2 x daily which is less than previous experience rec Plan A = Automatic = Bevespi(or symbicort)  Take 2 puffs first thing in am and then another 2 puffs about 12 hours later.  Plan B = Backup Only use your albuterol  Plan C = Crisis - only use your albuterol nebulizer if you first try Plan B and it fails to help > ok to use the nebulizer up to every 4 hours but if start  needing it regularly call for immediate appointment 02 2.5 lpm at bedtime, pulsed at 4 lpm with activity/ no 02 at rest     02/13/2017  f/u ov/Tiye Huwe re:  GOLD III / 02 hs and with activity / preferred symbicort over bevespi  Chief Complaint  Patient presents with  . Follow-up    COPD follow up. Denies any increase in SOB or chest pain. Breathing has been ok.   sleeps 45 degree recliner/ no excess daytime sleepiness Shops x 20 min then has to sit down / leg pain  and sob both  limiting  Maybe saba once a day   No obvious day to day or daytime variability or assoc excess/ purulent sputum or mucus plugs or hemoptysis or cp or chest tightness, subjective wheeze or overt sinus or hb symptoms. No unusual exp hx or h/o childhood pna/ asthma or knowledge of premature birth.  Sleeping ok without nocturnal  or early am exacerbation  of respiratory  c/o's or need for noct saba. Also denies any obvious fluctuation of symptoms with weather or environmental changes or other aggravating or alleviating factors except as outlined above   Current Medications, Allergies, Complete Past Medical History, Past Surgical History, Family History, and Social History were reviewed in [employer] record.  ROS  The following are not active complaints unless bolded sore throat, dysphagia, dental problems, itching, sneezing,  nasal congestion or excess/ purulent secretions, ear ache,   fever, chills, sweats, unintended wt loss, classically pleuritic or exertional cp,  orthopnea pnd or leg swelling, presyncope, palpitations, abdominal pain, anorexia, nausea, vomiting, diarrhea  or change in bowel or bladder habits, change in stools or urine, dysuria,hematuria,  rash, arthralgias, visual complaints, headache, numbness, weakness or ataxia or problems with walking or coordination,  change in mood/affect or memory.               Objective:   Physical Exam    obese amb wm    04/16/2016  309 >   05/28/2016  305 > 09/03/2016   318 >  02/13/2017  323  01/17/2016          311   12/20/15 308 lb (139.708 kg)  12/13/15 315 lb (142.883 kg)  12/08/15 312 lb (141.522 kg)    Vital signs reviewed  - sats 94% RA on arrival    HEENT: nl dentition, turbinates, and oropharynx. Nl external ear canals without cough reflex   NECK :  without JVD/Nodes/TM/ nl carotid upstrokes bilaterally   LUNGS: no acc muscle use,  Nl contour chest / distant BS bilaterally s wheeze    CV:  RRR  no s3 or murmur or increase in P2, no edema   ABD:  soft and nontender with nl inspiratory excursion in the supine position. No bruits or organomegaly, bowel sounds nl  MS:  Nl gait/ ext warm without deformities, calf tenderness, cyanosis or clubbing No obvious joint restrictions   SKIN: warm and dry without lesions    NEURO:  alert, approp, nl sensorium with  no motor deficits    CXR PA and Lateral:   01/17/2016 :    I personally reviewed images and agree with radiology impression as follows:   The lungs remain clear but hyperaerated consistent with and element of emphysema with flattened hemidiaphragms. Mediastinal and hilar contours are unremarkable. Heart size is stable being mildly enlarged. No acute bony abnormality is seen.      Assessment:

## 2017-02-13 NOTE — Progress Notes (Signed)
Subjective:    Patient ID: Kenneth Casey, male    DOB: 04-08-1956, 61 y.o.   MRN: [id number]  HPI  Mr. Susano Cleckler, a 61 year old male with a history of diastolic dysfunction, COPD,  and obesity presents accompanied by wife for a follow up of diastolic dysfunction, hypertension, prediabetes, and anxiety.   Mr. Schrieber states that symptoms have stabilized on current medication regimen.  He has continued to utilized home oxygen at 2 liters. He endorses dyspnea with exertion. He says that he has been having "sharp pain" to right chest at rest over the past several days. He denies palpitations. Aggravating factors are coughing, deep inspiration and walking.  Alleviating factors are: rest. Mr. Cocuzza continues to follow with Dr. Sherene Sires, pulmonology. Patient's symptoms are currently controlled on Symbicort twice daily. He maintains that he has not required albuterol inhaler. Mr. Wilmon Pali is a former smoker with a long history of tobacco use.  Patient's cardiac risk factors are obesity (BMI >= 30 kg/m2) and sedentary lifestyle.   He reports that he experiences anxiety primarily at night a He reports periodic insomnia.  Patient complains of increasing anxiety.   He has the following symptoms: feelings of losing control, insomnia, irritable, racing thoughts, shortness of breath. Onset of symptoms was several months ago. He was started on Xanax 0.25 mg HS one month ago. He says that sleep has improved moderately, however; he is continuously irritable during the day. He denies current suicidal and homicidal ideation. F  Past Medical History:  Diagnosis Date  . COPD (chronic obstructive pulmonary disease) (HCC)   . Obese   . Pneumonia    Social History   Social History  . Marital status: Married    Spouse name: N/A  . Number of children: N/A  . Years of education: N/A   Occupational History  . Not on file.   Social History Main Topics  . Smoking status: Former Smoker    Packs/day: 2.00    Years:  30.00    Types: Cigarettes    Quit date: 01/08/2011  . Smokeless tobacco: Never Used  . Alcohol use No  . Drug use: No  . Sexual activity: Not on file   Other Topics Concern  . Not on file   Social History Narrative  . No narrative on file   Immunization History  Administered Date(s) Administered  . Influenza Split 09/10/2015  . Influenza,inj,Quad PF,36+ Mos 09/03/2016    Review of Systems  Constitutional: Negative for chills, fatigue and fever.  HENT: Negative.   Respiratory: Positive for cough, shortness of breath (with exertion) and wheezing.   Cardiovascular: Negative for palpitations and leg swelling.  Gastrointestinal: Positive for abdominal pain. Negative for constipation, diarrhea, nausea, rectal pain and vomiting.  Endocrine: Negative for polydipsia, polyphagia and polyuria.  Genitourinary: Negative.  Negative for difficulty urinating.  Musculoskeletal: Negative.   Skin: Negative.   Neurological: Negative.  Negative for dizziness and light-headedness.  Hematological: Negative.   Psychiatric/Behavioral: Negative.  Negative for agitation. The patient is not nervous/anxious.        Objective:   Physical Exam  Constitutional: He is oriented to person, place, and time.  Morbid obesity  HENT:  Head: Normocephalic and atraumatic.  Right Ear: Hearing, tympanic membrane, external ear and ear canal normal.  Left Ear: Hearing, tympanic membrane, external ear and ear canal normal.  Mouth/Throat: Uvula is midline and oropharynx is clear and moist.  Eyes: Conjunctivae and EOM are normal. Pupils are equal, round, and reactive  to light.  Neck: Normal range of motion. Neck supple.  Cardiovascular: Regular rhythm, S1 normal, S2 normal, normal heart sounds and intact distal pulses.   Pulmonary/Chest: Effort normal. No apnea. No respiratory distress. He has wheezes in the right upper field. He has rhonchi in the right upper field, the right middle field, the left middle field and  the left lower field.  Abdominal: Soft. Bowel sounds are normal.  Increased abdominal girth (pendulous)  Musculoskeletal:       Left knee: He exhibits decreased range of motion. He exhibits no swelling and no erythema.  Neurological: He is alert and oriented to person, place, and time. No cranial nerve deficit or sensory deficit.  Skin: Skin is warm and dry.  Psychiatric: He has a normal mood and affect. His behavior is normal. Judgment and thought content normal.      BP 132/80 (BP Location: Right Arm, Patient Position: Sitting, Cuff Size: Large)   Pulse 84   Temp 98.2 F (36.8 C) (Oral)   Resp 18   Ht 5\' 8"  (1.727 m)   Wt (!) 325 lb (147.4 kg)   SpO2 94%   BMI 49.42 kg/m  Assessment & Plan:  1. Chest pain on breathing [name] has an appointment scheduled at [facility] at 2 pm. He has had periodic chest pains described as sharp at times. He also describes a "catch" with deep breathing.  I will defer to pulmonology for further workup and evaluation.  - DG Chest 2 View; Future  2. Severe obesity (BMI >= 40) (HCC)  - Lipid panel; Future - Hemoglobin A1c  3. On home oxygen therapy Continue O2 at 2 liters as ordered by pulmonology    4. Diastolic dysfunction Will check BNP, patient may warrant a referral to cardiology.  - COMPLETE METABOLIC PANEL WITH GFR - Brain natriuretic peptide  5. Prediabetes - COMPLETE METABOLIC PANEL WITH GFR  6. Generalized anxiety disorder - ALPRAZolam (XANAX) 0.25 MG tablet; Take 1 tablet (0.25 mg total) by mouth 3 (three) times daily as needed for anxiety.  Dispense: 30 tablet; Refill: 0 - busPIRone (BUSPAR) 5 MG tablet; Take 1 tablet (5 mg total) by mouth 2 (two) times daily.  Dispense: 60 tablet; Refill: 3  GAD 7 : Generalized Anxiety Score 02/13/2017 06/04/2016  Nervous, Anxious, on Edge 1 1  Control/stop worrying 1 0  Worry too much - different things 2 1  Trouble relaxing 2 1  Restless 1 0  Easily annoyed or irritable 1 2   Afraid - awful might happen 1 0  Total GAD 7 Score 9 5  Anxiety Difficulty Somewhat difficult Somewhat difficult   RTC: Follow up in 3 months for chronic conditions   Lynsee Wands M, FNP   The patient was given clear instructions to go to ER or return to medical center if symptoms do not improve, worsen or new problems develop. The patient verbalized understanding.

## 2017-02-14 LAB — BRAIN NATRIURETIC PEPTIDE: Brain Natriuretic Peptide: 10.8 pg/mL (ref <100)

## 2017-02-14 NOTE — Assessment & Plan Note (Addendum)
12/20/2015  extensive coaching HFA effectiveness =    75% > continue symbicort 160 2bid - Spirometry 01/17/2016  FEV1 1.62 (44%)  Ratio 61 - 05/28/2016   try bevespi 2bid > improved 09/03/2016 but decided liked symbicort better   Adequate control on present rx, reviewed in detail with pt > no change in rx needed    Each maintenance medication was reviewed in detail including most importantly the difference between maintenance and as needed and under what circumstances the prns are to be used.  Please see AVS for specific  Instructions which are unique to this visit and I personally typed out  which were reviewed in detail in writing with the patient and a copy provided.

## 2017-02-14 NOTE — Assessment & Plan Note (Signed)
On 02 3lpm since d/c 11/18/2015 - HCO3  34  12/14/15  - 01/17/2016 sats mid 90's RA at rest so rec 2lpm sleeping / exerting but none needed at rest  - 04/16/2016  Walked 4lpm pulsed x 3 laps @ 185 ft each stopped due to end of study, mild sob, no desat   rec as of 02/13/2017 = 2.5 lpm sleeping/  4  pulsed with ex

## 2017-02-14 NOTE — Assessment & Plan Note (Signed)
Body mass index is 49.11 kg/m.  trending  Up still  Lab Results  Component Value Date   TSH 2.564 12/08/2015     Contributing to gerd risk/ doe/reviewed the need and the process to achieve and maintain neg calorie balance > defer f/u primary care including intermittently monitoring thyroid status

## 2017-02-15 ENCOUNTER — Other Ambulatory Visit: Payer: Self-pay | Admitting: Family Medicine

## 2017-02-15 DIAGNOSIS — F411 Generalized anxiety disorder: Secondary | ICD-10-CM

## 2017-02-15 MED ORDER — BUSPIRONE HCL 10 MG PO TABS: 10.0000 mg | ORAL_TABLET | Freq: Two times a day (BID) | ORAL | 2 refills | Status: DC

## 2017-03-12 ENCOUNTER — Telehealth: Payer: Self-pay

## 2017-03-12 NOTE — Telephone Encounter (Signed)
Called Mobile and Home numbers. No answer and voicemail had not been set up. I was unable to leave a message. Will try later. Thanks!

## 2017-03-15 ENCOUNTER — Telehealth: Payer: Self-pay

## 2017-03-15 NOTE — Telephone Encounter (Signed)
Spoke with patient. His blood pressure is running higher than usual at 150/95. He denies shortness of breath/chest pain/headache. He states he is taking his medication consistently and has had no other changes. I advised him to monitor blood pressure and if he starts having any sob/ chest pain/ headache or blurred vision to report to the ER asap. We have scheduled a follow up for high blood pressure for 03/19/2017. Thanks!

## 2017-03-19 ENCOUNTER — Other Ambulatory Visit: Payer: Self-pay | Admitting: Family Medicine

## 2017-03-19 ENCOUNTER — Encounter: Payer: Self-pay | Admitting: Family Medicine

## 2017-03-19 ENCOUNTER — Ambulatory Visit (INDEPENDENT_AMBULATORY_CARE_PROVIDER_SITE_OTHER): Payer: Self-pay | Admitting: Family Medicine

## 2017-03-19 VITALS — BP 139/82 | HR 81 | Temp 97.7°F | Resp 16 | Ht 68.0 in | Wt 328.0 lb

## 2017-03-19 DIAGNOSIS — R058 Other specified cough: Secondary | ICD-10-CM

## 2017-03-19 DIAGNOSIS — J3089 Other allergic rhinitis: Secondary | ICD-10-CM

## 2017-03-19 DIAGNOSIS — Z9981 Dependence on supplemental oxygen: Secondary | ICD-10-CM

## 2017-03-19 DIAGNOSIS — F411 Generalized anxiety disorder: Secondary | ICD-10-CM

## 2017-03-19 DIAGNOSIS — J449 Chronic obstructive pulmonary disease, unspecified: Secondary | ICD-10-CM

## 2017-03-19 DIAGNOSIS — R05 Cough: Secondary | ICD-10-CM

## 2017-03-19 MED ORDER — PROMETHAZINE-DM 6.25-15 MG/5ML PO SYRP: 2.5000 mL | ORAL_SOLUTION | Freq: Four times a day (QID) | ORAL | 0 refills | Status: AC | PRN

## 2017-03-19 MED ORDER — PROMETHAZINE-DM 6.25-15 MG/5ML PO SYRP: 2.5000 mL | ORAL_SOLUTION | Freq: Four times a day (QID) | ORAL | 0 refills | Status: DC | PRN

## 2017-03-19 MED ORDER — ALPRAZOLAM 0.25 MG PO TABS: 0.2500 mg | ORAL_TABLET | Freq: Three times a day (TID) | ORAL | 0 refills | Status: DC | PRN

## 2017-03-19 NOTE — Patient Instructions (Addendum)
Allergic rhinitis and allergic cough:  Promethazine-DM every 6 hours as needed for allergic rhinitis    Allergies, Adult An allergy is when your body's defense system (immune system) overreacts to an otherwise harmless substance (allergen) that you breathe in or eat or something that touches your skin. When you come into contact with something that you are allergic to, your immune system produces certain proteins (antibodies). These proteins cause cells to release chemicals (histamines) that trigger the symptoms of an allergic reaction. Allergies often affect the nasal passages (allergic rhinitis), eyes (allergic conjunctivitis), skin (atopic dermatitis), and stomach. Allergies can be mild or severe. Allergies cannot spread from person to person (are not contagious). They can develop at any age and may be outgrown. What are the causes? Allergies can be caused by any substance that your immune system mistakenly targets as harmful. These may include:  Outdoor allergens, such as pollen, grass, weeds, car exhaust, and mold spores.  Indoor allergens, such as dust, smoke, mold, and pet dander.  Foods, especially peanuts, milk, eggs, fish, shellfish, soy, nuts, and wheat.  Medicines, such as penicillin.  Skin irritants, such as detergents, chemicals, and latex.  Perfume.  Insect bites or stings. What increases the risk? You may be at greater risk of allergies if other people in your family have allergies. What are the signs or symptoms? Symptoms depend on what type of allergy you have. They may include:  Runny, stuffy nose.  Sneezing.  Itchy mouth, ears, or throat.  Postnasal drip.  Sore throat.  Itchy, red, watery, or puffy eyes.  Skin rash or hives.  Stomach pain.  Vomiting.  Diarrhea.  Bloating.  Wheezing or coughing. People with a severe allergy to food, medicine, or an insect bite may have a life-threatening allergic reaction (anaphylaxis). Symptoms of anaphylaxis  include:  Hives.  Itching.  Flushed face.  Swollen lips, tongue, or mouth.  Tight or swollen throat.  Chest pain or tightness in the chest.  Trouble breathing or shortness of breath.  Rapid heartbeat.  Dizziness or fainting.  Vomiting.  Diarrhea.  Pain in the abdomen. How is this diagnosed? This condition is diagnosed based on:  Your symptoms.  Your family and medical history.  A physical exam. You may need to see a health care provider who specializes in treating allergies (allergist). You may also have tests, including:  Skin tests to see which allergens are causing your symptoms, such as:  Skin prick test. In this test, your skin is pricked with a tiny needle and exposed to small amounts of possible allergens to see if your skin reacts.  Intradermal skin test. In this test, a small amount of allergen is injected under your skin to see if your skin reacts.  Patch test. In this test, a small amount of allergen is placed on your skin and then your skin is covered with a bandage. Your health care provider will check your skin after a couple of days to see if a rash has developed.  Blood tests.  Challenges tests. In this test, you inhale a small amount of allergen by mouth to see if you have an allergic reaction. You may also be asked to:  Keep a food diary. A food diary is a record of all the foods and drinks you have in a day and any symptoms you experience.  Practice an elimination diet. An elimination diet involves eliminating specific foods from your diet and then adding them back in one by one to find out if  a certain food causes an allergic reaction. How is this treated? Treatment for allergies depends on your symptoms. Treatment may include:  Cold compresses to soothe itching and swelling.  Eye drops.  Nasal sprays.  Using a saline spray or container (neti pot) to flush out the nose (nasal irrigation). These methods can help clear away mucus and keep  the nasal passages moist.  Using a humidifier.  Oral antihistamines or other medicines to block allergic reaction and inflammation.  Skin creams to treat rashes or itching.  Diet changes to eliminate food allergy triggers.  Repeated exposure to tiny amounts of allergens to build up a tolerance and prevent future allergic reactions (immunotherapy). These include:  Allergy shots.  Oral treatment. This involves taking small doses of an allergen under the tongue (sublingual immunotherapy).  Emergency epinephrine injection (auto-injector) in case of an allergic emergency. This is a self-injectable, pre-measured medicine that must be given within the first few minutes of a serious allergic reaction. Follow these instructions at home:  Avoid known allergens whenever possible.  If you suffer from airborne allergens, wash out your nose daily. You can do this with a saline spray or a neti pot to flush out your nose (nasal irrigation).  Take over-the-counter and prescription medicines only as told by your health care provider.  Keep all follow-up visits as told by your health care provider. This is important.  If you are at risk of a severe allergic reaction (anaphylaxis), keep your auto-injector with you at all times.  If you have ever had anaphylaxis, wear a medical alert bracelet or necklace that states you have a severe allergy. Contact a health care provider if:  Your symptoms do not improve with treatment. Get help right away if:  You have symptoms of anaphylaxis, such as:  Swollen mouth, tongue, or throat.  Pain or tightness in your chest.  Trouble breathing or shortness of breath.  Dizziness or fainting.  Severe abdominal pain, vomiting, or diarrhea. This information is not intended to replace advice given to you by your health care provider. Make sure you discuss any questions you have with your health care provider. Document Released: 02/19/2003 Document Revised:  07/26/2016 Document Reviewed: 06/13/2016 Elsevier Interactive Patient Education  2017 [employer]  Allergies, Adult An allergy is when your body's defense system (immune system) overreacts to an otherwise harmless substance (allergen) that you breathe in or eat or something that touches your skin. When you come into contact with something that you are allergic to, your immune system produces certain proteins (antibodies). These proteins cause cells to release chemicals (histamines) that trigger the symptoms of an allergic reaction. Allergies often affect the nasal passages (allergic rhinitis), eyes (allergic conjunctivitis), skin (atopic dermatitis), and stomach. Allergies can be mild or severe. Allergies cannot spread from person to person (are not contagious). They can develop at any age and may be outgrown. What are the causes? Allergies can be caused by any substance that your immune system mistakenly targets as harmful. These may include:  Outdoor allergens, such as pollen, grass, weeds, car exhaust, and mold spores.  Indoor allergens, such as dust, smoke, mold, and pet dander.  Foods, especially peanuts, milk, eggs, fish, shellfish, soy, nuts, and wheat.  Medicines, such as penicillin.  Skin irritants, such as detergents, chemicals, and latex.  Perfume.  Insect bites or stings. What increases the risk? You may be at greater risk of allergies if other people in your family have allergies. What are the signs or symptoms? Symptoms  depend on what type of allergy you have. They may include:  Runny, stuffy nose.  Sneezing.  Itchy mouth, ears, or throat.  Postnasal drip.  Sore throat.  Itchy, red, watery, or puffy eyes.  Skin rash or hives.  Stomach pain.  Vomiting.  Diarrhea.  Bloating.  Wheezing or coughing. People with a severe allergy to food, medicine, or an insect bite may have a life-threatening allergic reaction (anaphylaxis). Symptoms of anaphylaxis  include:  Hives.  Itching.  Flushed face.  Swollen lips, tongue, or mouth.  Tight or swollen throat.  Chest pain or tightness in the chest.  Trouble breathing or shortness of breath.  Rapid heartbeat.  Dizziness or fainting.  Vomiting.  Diarrhea.  Pain in the abdomen. How is this diagnosed? This condition is diagnosed based on:  Your symptoms.  Your family and medical history.  A physical exam. You may need to see a health care provider who specializes in treating allergies (allergist). You may also have tests, including:  Skin tests to see which allergens are causing your symptoms, such as:  Skin prick test. In this test, your skin is pricked with a tiny needle and exposed to small amounts of possible allergens to see if your skin reacts.  Intradermal skin test. In this test, a small amount of allergen is injected under your skin to see if your skin reacts.  Patch test. In this test, a small amount of allergen is placed on your skin and then your skin is covered with a bandage. Your health care provider will check your skin after a couple of days to see if a rash has developed.  Blood tests.  Challenges tests. In this test, you inhale a small amount of allergen by mouth to see if you have an allergic reaction. You may also be asked to:  Keep a food diary. A food diary is a record of all the foods and drinks you have in a day and any symptoms you experience.  Practice an elimination diet. An elimination diet involves eliminating specific foods from your diet and then adding them back in one by one to find out if a certain food causes an allergic reaction. How is this treated? Treatment for allergies depends on your symptoms. Treatment may include:  Cold compresses to soothe itching and swelling.  Eye drops.  Nasal sprays.  Using a saline spray or container (neti pot) to flush out the nose (nasal irrigation). These methods can help clear away mucus and keep  the nasal passages moist.  Using a humidifier.  Oral antihistamines or other medicines to block allergic reaction and inflammation.  Skin creams to treat rashes or itching.  Diet changes to eliminate food allergy triggers.  Repeated exposure to tiny amounts of allergens to build up a tolerance and prevent future allergic reactions (immunotherapy). These include:  Allergy shots.  Oral treatment. This involves taking small doses of an allergen under the tongue (sublingual immunotherapy).  Emergency epinephrine injection (auto-injector) in case of an allergic emergency. This is a self-injectable, pre-measured medicine that must be given within the first few minutes of a serious allergic reaction. Follow these instructions at home:  Avoid known allergens whenever possible.  If you suffer from airborne allergens, wash out your nose daily. You can do this with a saline spray or a neti pot to flush out your nose (nasal irrigation).  Take over-the-counter and prescription medicines only as told by your health care provider.  Keep all follow-up visits as told by  your health care provider. This is important.  If you are at risk of a severe allergic reaction (anaphylaxis), keep your auto-injector with you at all times.  If you have ever had anaphylaxis, wear a medical alert bracelet or necklace that states you have a severe allergy. Contact a health care provider if:  Your symptoms do not improve with treatment. Get help right away if:  You have symptoms of anaphylaxis, such as:  Swollen mouth, tongue, or throat.  Pain or tightness in your chest.  Trouble breathing or shortness of breath.  Dizziness or fainting.  Severe abdominal pain, vomiting, or diarrhea. This information is not intended to replace advice given to you by your health care provider. Make sure you discuss any questions you have with your health care provider. Document Released: 02/19/2003 Document Revised:  07/26/2016 Document Reviewed: 06/13/2016 Elsevier Interactive Patient Education  2017 [employer].

## 2017-03-19 NOTE — Progress Notes (Signed)
Subjective:    Patient ID: Kenneth Casey, male    DOB: 1956/03/29, 61 y.o.   MRN: [id number] [name], a 61 year old male with a history of CHF, COPD, and obesity presents complaining of environmental allergies and an allergic cough over the past week. [name] says that he had been spending times outdoors mowing grass and other activities and allergy symptoms increased. His symptoms include itching, watery eyes, itchy ears, and itching throat. He also endorses post nasal drip. He has not taking any OTC medications to alleviate symptoms.  Cough  This is a new problem. The current episode started in the past 7 days. The problem occurs constantly. The cough is non-productive. Associated symptoms include nasal congestion, postnasal drip, rhinorrhea and shortness of breath. Pertinent negatives include no chills. The symptoms are aggravated by cold air and pollens. He has tried steroid inhaler and a beta-agonist inhaler for the symptoms. The treatment provided mild relief. His past medical history is significant for COPD, environmental allergies and pneumonia. There is no history of asthma, bronchiectasis, bronchitis or emphysema.    Past Medical History:  Diagnosis Date  . COPD (chronic obstructive pulmonary disease) (HCC)   . Obese   . Pneumonia    Social History   Social History  . Marital status: Married    Spouse name: N/A  . Number of children: N/A  . Years of education: N/A   Occupational History  . Not on file.   Social History Main Topics  . Smoking status: Former Smoker    Packs/day: 2.00    Years: 30.00    Types: Cigarettes    Quit date: 01/08/2011  . Smokeless tobacco: Never Used  . Alcohol use No  . Drug use: No  . Sexual activity: Not on file   Other Topics Concern  . Not on file   Social History Narrative  . No narrative on file   Immunization History  Administered Date(s) Administered  . Influenza Split 09/10/2015  . Influenza,inj,Quad PF,36+ Mos  09/03/2016   Review of Systems  Constitutional: Negative.  Negative for chills.  HENT: Positive for postnasal drip and rhinorrhea.   Respiratory: Positive for apnea, cough and shortness of breath.   Cardiovascular: Negative.   Gastrointestinal: Negative.   Endocrine: Negative.   Genitourinary: Negative.   Musculoskeletal: Negative.   Allergic/Immunologic: Positive for environmental allergies. Negative for immunocompromised state.  Neurological: Negative.   Hematological: Negative.   Psychiatric/Behavioral: Negative.        Objective:   Physical Exam  Constitutional: He is oriented to person, place, and time. He appears well-developed and well-nourished.  HENT:  Head: Normocephalic and atraumatic.  Right Ear: External ear normal.  Left Ear: External ear normal.  Nose: Nose normal.  Mouth/Throat: Oropharynx is clear and moist.  Eyes: Conjunctivae are normal. Pupils are equal, round, and reactive to light.  Neck: Normal range of motion. Neck supple.  Cardiovascular: Normal rate, regular rhythm, normal heart sounds and intact distal pulses.   Pulmonary/Chest: Apnea noted. No tachypnea. No respiratory distress. He has no decreased breath sounds. He has rales.  Abdominal: Soft. Bowel sounds are normal.  Neurological: He is alert and oriented to person, place, and time. He has normal reflexes.  Skin: Skin is warm and dry.  Psychiatric: His mood appears anxious.      BP 139/82 (BP Location: Right Arm, Patient Position: Sitting, Cuff Size: Large)   Pulse 81   Temp 97.7 F (36.5 C) (Oral)   Resp  16   Ht  (1.727 m)   Wt (!) 328 lb (148.8 kg)   SpO2 94% Comment: on 2 L o2  BMI 49.87 kg/m  Assessment & Plan:  1. Seasonal allergic rhinitis due to other allergic trigger, unspecified chronicity - promethazine-dextromethorphan (PROMETHAZINE-DM) 6.25-15 MG/5ML syrup; Take 2.5 mLs by mouth 4 (four) times daily as needed for cough.  Dispense: 118 mL; Refill: 0  2. Allergic  cough Discussed discontinuing use after 5 days, if symptoms persist, notify me.  - promethazine-dextromethorphan (PROMETHAZINE-DM) 6.25-15 MG/5ML syrup; Take 2.5 mLs by mouth 4 (four) times daily as needed for cough.  Dispense: 118 mL; Refill: 0  3. Generalized anxiety disorder Will continue Alprazolam. He has periodic shortness of breath and experiences increased anxiety.  - ALPRAZolam (XANAX) 0.25 MG tablet; Take 1 tablet (0.25 mg total) by mouth 3 (three) times daily as needed for anxiety.  Dispense: 30 tablet; Refill: 0  4. On home oxygen therapy Continue 2 liters of oxygen as prescribed by pulmonologist.   5. COPD GOLD III Continue daily inhalers and oxygen therapy. Follow up with pulmonologist as scheduled.    RTC: As previously scheduled    Nolon Nations  MSN, FNP-C [facility] [address] [place], [place] [address] [phone]   The patient was given clear instructions to go to ER or return to medical center if symptoms do not improve, worsen or new problems develop. The patient verbalized understanding.

## 2017-04-17 ENCOUNTER — Telehealth: Payer: Self-pay | Admitting: Internal Medicine

## 2017-04-17 NOTE — Telephone Encounter (Signed)
ATC, unable to leave voicemail, mail box not set up Will call back 04/18/17

## 2017-04-18 NOTE — Telephone Encounter (Signed)
ATC pt, no voicemail set up.  Will call back

## 2017-04-19 NOTE — Telephone Encounter (Signed)
ATC, unable to leave a VM due to VM not being set up.

## 2017-04-22 NOTE — Telephone Encounter (Signed)
Spoke with pt, who is requesting an Rx to be sent to [facility] for 2L O2 with exertion and qhs. It does not appear that MW originally prescribed pt's oxygen. Pt is aware that an apt is needed to evaluate pt before an Rx can be sent. I have offered pt an apt for 04/25/17, pt refused and states he will call back to schedule an apt.  Will route to MW has an Financial plannerYI.

## 2017-04-23 ENCOUNTER — Other Ambulatory Visit: Payer: Self-pay

## 2017-04-23 MED ORDER — CARVEDILOL 6.25 MG PO TABS: 6.2500 mg | ORAL_TABLET | Freq: Two times a day (BID) | ORAL | 1 refills | Status: DC

## 2017-04-23 NOTE — Telephone Encounter (Signed)
This has been sent into [facility]. Thanks!

## 2017-04-24 MED FILL — $VENTOLIN HFA 18G INHALER: 108 (90 BAS | 30 days supply | Qty: 18 | Fill #1

## 2017-04-24 MED FILL — $Symbicort 80-4.5mcg inhale: 80-4.5 | 30 days supply | Qty: 1 | Fill #0

## 2017-04-29 ENCOUNTER — Telehealth: Payer: Self-pay

## 2017-04-29 ENCOUNTER — Other Ambulatory Visit: Payer: Self-pay | Admitting: Family Medicine

## 2017-04-29 DIAGNOSIS — F411 Generalized anxiety disorder: Secondary | ICD-10-CM

## 2017-04-29 MED ORDER — ALPRAZOLAM 0.25 MG PO TABS: 0.2500 mg | ORAL_TABLET | Freq: Three times a day (TID) | ORAL | 0 refills | Status: DC | PRN

## 2017-04-29 NOTE — Progress Notes (Signed)
Reviewed [place] Substance Reporting system prior to prescribing controlled medications. No inconsistencies noted.    Meds ordered this encounter  Medications  . ALPRAZolam (XANAX) 0.25 MG tablet    Sig: Take 1 tablet (0.25 mg total) by mouth 3 (three) times daily as needed for anxiety.    Dispense:  30 tablet    Refill:  0    Order Specific Question:   Supervising Provider    Answer:   [name], [name] [[id number]]    Nolon NationsLaChina Moore Briaunna Grindstaff  MSN, FNP-C [facility] [address]  [place], [place] [id number] [phone]

## 2017-05-14 ENCOUNTER — Ambulatory Visit (INDEPENDENT_AMBULATORY_CARE_PROVIDER_SITE_OTHER): Payer: Medicaid Other | Admitting: Family Medicine

## 2017-05-14 ENCOUNTER — Encounter: Payer: Self-pay | Admitting: Family Medicine

## 2017-05-14 VITALS — BP 130/71 | HR 82 | Temp 98.2°F | Resp 18 | Ht 68.0 in | Wt 330.0 lb

## 2017-05-14 DIAGNOSIS — J449 Chronic obstructive pulmonary disease, unspecified: Secondary | ICD-10-CM | POA: Diagnosis not present

## 2017-05-14 DIAGNOSIS — R600 Localized edema: Secondary | ICD-10-CM | POA: Diagnosis not present

## 2017-05-14 DIAGNOSIS — G629 Polyneuropathy, unspecified: Secondary | ICD-10-CM

## 2017-05-14 DIAGNOSIS — I519 Heart disease, unspecified: Secondary | ICD-10-CM | POA: Diagnosis not present

## 2017-05-14 DIAGNOSIS — R7303 Prediabetes: Secondary | ICD-10-CM

## 2017-05-14 DIAGNOSIS — Z9981 Dependence on supplemental oxygen: Secondary | ICD-10-CM

## 2017-05-14 DIAGNOSIS — I5189 Other ill-defined heart diseases: Secondary | ICD-10-CM

## 2017-05-14 DIAGNOSIS — F411 Generalized anxiety disorder: Secondary | ICD-10-CM

## 2017-05-14 DIAGNOSIS — Z9109 Other allergy status, other than to drugs and biological substances: Secondary | ICD-10-CM | POA: Diagnosis not present

## 2017-05-14 LAB — POCT GLYCOSYLATED HEMOGLOBIN (HGB A1C): Hemoglobin A1C: 6

## 2017-05-14 MED ORDER — GABAPENTIN 400 MG PO CAPS: ORAL_CAPSULE | ORAL | 2 refills | Status: DC

## 2017-05-14 MED ORDER — CETIRIZINE HCL 10 MG PO TABS
10.0000 mg | ORAL_TABLET | Freq: Every day | ORAL | 11 refills | Status: DC
Start: 2017-05-14 — End: 2017-05-14

## 2017-05-14 MED ORDER — CETIRIZINE HCL 10 MG PO TABS: 10.0000 mg | ORAL_TABLET | Freq: Every day | ORAL | 11 refills | Status: DC

## 2017-05-14 NOTE — Patient Instructions (Addendum)
Neuropathy: Will increase Gabapentin to 400 mg twice daily   Environmental allergies:  Will start Zyrtec 10 mg daily     Allergies, Adult An allergy is when your body's defense system (immune system) overreacts to an otherwise harmless substance (allergen) that you breathe in or eat or something that touches your skin. When you come into contact with something that you are allergic to, your immune system produces certain proteins (antibodies). These proteins cause cells to release chemicals (histamines) that trigger the symptoms of an allergic reaction. Allergies often affect the nasal passages (allergic rhinitis), eyes (allergic conjunctivitis), skin (atopic dermatitis), and stomach. Allergies can be mild or severe. Allergies cannot spread from person to person (are not contagious). They can develop at any age and may be outgrown. What increases the risk? You may be at greater risk of allergies if other people in your family have allergies. What are the signs or symptoms? Symptoms depend on what type of allergy you have. They may include:  Runny, stuffy nose.  Sneezing.  Itchy mouth, ears, or throat.  Postnasal drip.  Sore throat.  Itchy, red, watery, or puffy eyes.  Skin rash or hives.  Stomach pain.  Vomiting.  Diarrhea.  Bloating.  Wheezing or coughing.  People with a severe allergy to food, medicine, or an insect bite may have a life-threatening allergic reaction (anaphylaxis). Symptoms of anaphylaxis include:  Hives.  Itching.  Flushed face.  Swollen lips, tongue, or mouth.  Tight or swollen throat.  Chest pain or tightness in the chest.  Trouble breathing or shortness of breath.  Rapid heartbeat.  Dizziness or fainting.  Vomiting.  Diarrhea.  Pain in the abdomen.  How is this diagnosed? This condition is diagnosed based on:  Your symptoms.  Your family and medical history.  A physical exam.  You may need to see a health care provider  who specializes in treating allergies (allergist). You may also have tests, including:  Skin tests to see which allergens are causing your symptoms, such as: ? Skin prick test. In this test, your skin is pricked with a tiny needle and exposed to small amounts of possible allergens to see if your skin reacts. ? Intradermal skin test. In this test, a small amount of allergen is injected under your skin to see if your skin reacts. ? Patch test. In this test, a small amount of allergen is placed on your skin and then your skin is covered with a bandage. Your health care provider will check your skin after a couple of days to see if a rash has developed.  Blood tests.  Challenges tests. In this test, you inhale a small amount of allergen by mouth to see if you have an allergic reaction.  You may also be asked to:  Keep a food diary. A food diary is a record of all the foods and drinks you have in a day and any symptoms you experience.  Practice an elimination diet. An elimination diet involves eliminating specific foods from your diet and then adding them back in one by one to find out if a certain food causes an allergic reaction.  How is this treated? Treatment for allergies depends on your symptoms. Treatment may include:  Cold compresses to soothe itching and swelling.  Eye drops.  Nasal sprays.  Using a saline spray or container (neti pot) to flush out the nose (nasal irrigation). These methods can help clear away mucus and keep the nasal passages moist.  Using a humidifier.  Oral antihistamines or other medicines to block allergic reaction and inflammation.  Skin creams to treat rashes or itching.  Diet changes to eliminate food allergy triggers.  Repeated exposure to tiny amounts of allergens to build up a tolerance and prevent future allergic reactions (immunotherapy). These include: ? Allergy shots. ? Oral treatment. This involves taking small doses of an allergen under the  tongue (sublingual immunotherapy).  Emergency epinephrine injection (auto-injector) in case of an allergic emergency. This is a self-injectable, pre-measured medicine that must be given within the first few minutes of a serious allergic reaction.  Follow these instructions at home:  Avoid known allergens whenever possible.  If you suffer from airborne allergens, wash out your nose daily. You can do this with a saline spray or a neti pot to flush out your nose (nasal irrigation).  Take over-the-counter and prescription medicines only as told by your health care provider.  Keep all follow-up visits as told by your health care provider. This is important.  If you are at risk of a severe allergic reaction (anaphylaxis), keep your auto-injector with you at all times.  If you have ever had anaphylaxis, wear a medical alert bracelet or necklace that states you have a severe allergy. Contact a health care provider if:  Your symptoms do not improve with treatment. Get help right away if:  You have symptoms of anaphylaxis, such as: ? Swollen mouth, tongue, or throat. ? Pain or tightness in your chest. ? Trouble breathing or shortness of breath. ? Dizziness or fainting. ? Severe abdominal pain, vomiting, or diarrhea. This information is not intended to replace advice given to you by your health care provider. Make sure you discuss any questions you have with your health care provider. Document Released: 02/19/2003 Document Revised: 07/26/2016 Document Reviewed: 06/13/2016 Elsevier Interactive Patient Education  2018 [employer]  Peripheral Neuropathy Peripheral neuropathy is a type of nerve damage. It affects nerves that carry signals between the spinal cord and other parts of the body. These are called peripheral nerves. With peripheral neuropathy, one nerve or a group of nerves may be damaged. What are the causes? Many things can damage peripheral nerves. For some people with peripheral  neuropathy, the cause is unknown. Some causes include:  Diabetes. This is the most common cause of peripheral neuropathy.  Injury to a nerve.  Pressure or stress on a nerve that lasts a long time.  Too little vitamin B. Alcoholism can lead to this.  Infections.  Autoimmune diseases, such as multiple sclerosis and systemic lupus erythematosus.  Inherited nerve diseases.  Some medicines, such as cancer drugs.  Toxic substances, such as lead and mercury.  Too little blood flowing to the legs.  Kidney disease.  Thyroid disease.  What are the signs or symptoms? Different people have different symptoms. The symptoms you have will depend on which of your nerves is damaged. Common symptoms include:  Loss of feeling (numbness) in the feet and hands.  Tingling in the feet and hands.  Pain that burns.  Very sensitive skin.  Weakness.  Not being able to move a part of the body (paralysis).  Muscle twitching.  Clumsiness or poor coordination.  Loss of balance.  Not being able to control your bladder.  Feeling dizzy.  Sexual problems.  How is this diagnosed? Peripheral neuropathy is a symptom, not a disease. Finding the cause of peripheral neuropathy can be hard. To figure that out, your health care provider will take a medical history and do a  physical exam. A neurological exam will also be done. This involves checking things affected by your brain, spinal cord, and nerves (nervous system). For example, your health care provider will check your reflexes, how you move, and what you can feel. Other types of tests may also be ordered, such as:  Blood tests.  A test of the fluid in your spinal cord.  Imaging tests, such as CT scans or an MRI.  Electromyography (EMG). This test checks the nerves that control muscles.  Nerve conduction velocity tests. These tests check how fast messages pass through your nerves.  Nerve biopsy. A small piece of nerve is removed. It is  then checked under a microscope.  How is this treated?  Medicine is often used to treat peripheral neuropathy. Medicines may include: ? Pain-relieving medicines. Prescription or over-the-counter medicine may be suggested. ? Antiseizure medicine. This may be used for pain. ? Antidepressants. These also may help ease pain from neuropathy. ? Lidocaine. This is a numbing medicine. You might wear a patch or be given a shot. ? Mexiletine. This medicine is typically used to help control irregular heart rhythms.  Surgery. Surgery may be needed to relieve pressure on a nerve or to destroy a nerve that is causing pain.  Physical therapy to help movement.  Assistive devices to help movement. Follow these instructions at home:  Only take over-the-counter or prescription medicines as directed by your health care provider. Follow the instructions carefully for any given medicines. Do not take any other medicines without first getting approval from your health care provider.  If you have diabetes, work closely with your health care provider to keep your blood sugar under control.  If you have numbness in your feet: ? Check every day for signs of injury or infection. Watch for redness, warmth, and swelling. ? Wear padded socks and comfortable shoes. These help protect your feet.  Do not do things that put pressure on your damaged nerve.  Do not smoke. Smoking keeps blood from getting to damaged nerves.  Avoid or limit alcohol. Too much alcohol can cause a lack of B vitamins. These vitamins are needed for healthy nerves.  Develop a good support system. Coping with peripheral neuropathy can be stressful. Talk to a mental health specialist or join a support group if you are struggling.  Follow up with your health care provider as directed. Contact a health care provider if:  You have new signs or symptoms of peripheral neuropathy.  You are struggling emotionally from dealing with peripheral  neuropathy.  You have a fever. Get help right away if:  You have an injury or infection that is not healing.  You feel very dizzy or begin vomiting.  You have chest pain.  You have trouble breathing. This information is not intended to replace advice given to you by your health care provider. Make sure you discuss any questions you have with your health care provider. Document Released: 11/16/2002 Document Revised: 05/03/2016 Document Reviewed: 08/03/2013 Elsevier Interactive Patient Education  2017 [employer].

## 2017-05-14 NOTE — Progress Notes (Signed)
Subjective:    Patient ID: Dorena DewHerman Cairns, male    DOB: 11/03/1956, 61 y.o.   MRN: [id number]  Mr. Dorena DewHerman Mcelhaney, a 61 year old male with a history of diastolic dysfunction, COPD,  and obesity presents accompanied by wife for a follow up of diastolic dysfunction, hypertension, prediabetes, and anxiety.   [name] states that symptoms have stabilized on current medication regimen.  He has continued to utilized home oxygen at 2 liters. He endorses dyspnea with exertion. He says that he has been having "sharp pain" to right chest at rest over the past several days. He denies palpitations. Aggravating factors are coughing, deep inspiration and walking.  Alleviating factors are: rest. [name] continues to follow with Dr. Sherene SiresWert, pulmonology. Patient's symptoms are currently controlled on Symbicort twice daily. He maintains that he has not required albuterol inhaler. [name] is a former smoker with a long history of tobacco use. Patient's cardiac risk factors are obesity (BMI >= 30 kg/m2) and sedentary lifestyle.   [name] says that he had been spending times outdoors mowing grass and other activities and allergy symptoms increased. His symptoms include itching, watery eyes, itchy ears, and itching throat. He also endorses post nasal drip. He has not taken any OTC medications to alleviate symptoms.  He reports that he experiences anxiety primarily at night with periodic insomnia. He has not been able to have a sleep study due to financial  constraints.  [name] says that anxiety increases with shortness of breath. He has the following symptoms: feelings of losing control, insomnia, irritable, racing thoughts, shortness of breath. Onset of symptoms was several months ago. Symptoms of anxiety are relieved by Diazepam 0.25 mg.  HPI  Past Medical History:  Diagnosis Date  . COPD (chronic obstructive pulmonary disease) (HCC)   . Obese   . Pneumonia    Social History   Social History  . Marital  status: Married    Spouse name: N/A  . Number of children: N/A  . Years of education: N/A   Occupational History  . Not on file.   Social History Main Topics  . Smoking status: Former Smoker    Packs/day: 2.00    Years: 30.00    Types: Cigarettes    Quit date: 01/08/2011  . Smokeless tobacco: Never Used  . Alcohol use No  . Drug use: No  . Sexual activity: Not on file   Other Topics Concern  . Not on file   Social History Narrative  . No narrative on file   Immunization History  Administered Date(s) Administered  . Influenza Split 09/10/2015  . Influenza,inj,Quad PF,36+ Mos 09/03/2016   Review of Systems  Constitutional: Negative.  Negative for chills.  HENT: Positive for postnasal drip and rhinorrhea.   Respiratory: Positive for apnea and cough.   Cardiovascular: Negative.   Gastrointestinal: Negative.   Endocrine: Negative.   Genitourinary: Negative.   Musculoskeletal: Negative.   Allergic/Immunologic: Positive for environmental allergies. Negative for immunocompromised state.  Neurological: Negative.   Hematological: Negative.   Psychiatric/Behavioral: Negative.        Objective:   Physical Exam  Constitutional: He is oriented to person, place, and time. He appears well-developed and well-nourished.  HENT:  Head: Normocephalic and atraumatic.  Right Ear: External ear normal.  Left Ear: External ear normal.  Nose: Nose normal.  Mouth/Throat: Oropharynx is clear and moist.  Eyes: Conjunctivae are normal. Pupils are equal, round, and reactive to light.  Neck: Normal range of motion.  Neck supple.  Cardiovascular: Normal rate, regular rhythm, normal heart sounds and intact distal pulses.   Pulmonary/Chest: Apnea noted. No tachypnea. No respiratory distress. He has no decreased breath sounds. He has rales.  Abdominal: Soft. Bowel sounds are normal.  Neurological: He is alert and oriented to person, place, and time. He has normal reflexes.  Skin: Skin is warm  and dry.  Psychiatric: His mood appears anxious.      BP 130/71 (BP Location: Left Arm, Patient Position: Sitting, Cuff Size: Large)   Pulse 82   Temp 98.2 F (36.8 C) (Oral)   Resp 18   Ht 5\' 8"  (1.727 m)   Wt (!) 330 lb (149.7 kg)   SpO2 92% Comment: on room air  BMI 50.18 kg/m  Assessment & Plan:  1. COPD GOLD III Continue to follow up with Dr. Sherene Sires as scheduled.  Continue home oxygen at 2 L.  Conserve energy, refrain from outdoor activities during extremely hot weather.   2. Generalized anxiety disorder Will continue Xanax 0.25 mg HS as needed.  Will continue Buspar 10 mg BID  3. Prediabetes Recommend a carbohydrate modified diet. Discussed diet at length with Mr and Mrs. Hegstrom. He typically eats very large, high fat meals. Discussed the importance of small, frequent meals in improving metabolism.  - HgB A1c  4. Environmental allergies  - cetirizine (ZYRTEC) 10 MG tablet; Take 1 tablet (10 mg total) by mouth daily.  Dispense: 30 tablet; Refill: 11  5. Neuropathy - gabapentin (NEURONTIN) 400 MG capsule; 400 mg twice daily  Dispense: 60 capsule; Refill: 2  6. Diastolic dysfunction - BASIC METABOLIC PANEL WITH GFR  7. Bilateral edema of lower extremity - BASIC METABOLIC PANEL WITH GFR  8. On home oxygen therapy Continue oxygen at 2L   RTC: 3 months for chronic conditions   Nolon Nations  MSN, FNP-C [facility] Patient [facility] [address] [place], [place] [address] [phone]

## 2017-05-15 LAB — BASIC METABOLIC PANEL WITH GFR
BUN: 19 mg/dL (ref 7–25)
CHLORIDE: 99 mmol/L (ref 98–110)
CO2: 31 mmol/L (ref 20–31)
Calcium: 9.1 mg/dL (ref 8.6–10.3)
Creat: 0.96 mg/dL (ref 0.70–1.33)
GFR, EST NON AFRICAN AMERICAN: 87 mL/min (ref >=60)
GFR, Est African American: >89 mL/min (ref >=60)
Glucose, Bld: 93 mg/dL (ref 65–99)
Potassium: 4.5 mmol/L (ref 3.5–5.3)
SODIUM: 142 mmol/L (ref 135–146)

## 2017-06-03 ENCOUNTER — Other Ambulatory Visit: Payer: Self-pay | Admitting: Family Medicine

## 2017-06-03 ENCOUNTER — Telehealth: Payer: Self-pay

## 2017-06-03 ENCOUNTER — Other Ambulatory Visit: Payer: Self-pay

## 2017-06-03 DIAGNOSIS — L719 Rosacea, unspecified: Secondary | ICD-10-CM

## 2017-06-03 DIAGNOSIS — F411 Generalized anxiety disorder: Secondary | ICD-10-CM

## 2017-06-03 MED ORDER — FUROSEMIDE 40 MG PO TABS: 40.0000 mg | ORAL_TABLET | Freq: Every day | ORAL | 1 refills | Status: DC

## 2017-06-03 MED ORDER — ALPRAZOLAM 0.25 MG PO TABS: 0.2500 mg | ORAL_TABLET | Freq: Three times a day (TID) | ORAL | 0 refills | Status: DC | PRN

## 2017-06-03 NOTE — Telephone Encounter (Signed)
Refill for lasix sent into pharmacy. Thanks!  

## 2017-06-03 NOTE — Progress Notes (Signed)
Reviewed [place] Substance Reporting system prior to prescribing controlled medications. No inconsistencies noted.    Meds ordered this encounter  Medications  . ALPRAZolam (XANAX) 0.25 MG tablet    Sig: Take 1 tablet (0.25 mg total) by mouth 3 (three) times daily as needed for anxiety.    Dispense:  30 tablet    Refill:  0    Order Specific Question:   Supervising Provider    Answer:   [name], [name] [[id number]]     Nolon NationsLaChina Moore Medard Decuir  MSN, FNP-C [facility] [address]  , [place] [address] [phone]

## 2017-07-03 ENCOUNTER — Other Ambulatory Visit: Payer: Self-pay | Admitting: Family Medicine

## 2017-07-03 DIAGNOSIS — F411 Generalized anxiety disorder: Secondary | ICD-10-CM

## 2017-07-08 ENCOUNTER — Telehealth: Payer: Self-pay

## 2017-07-08 ENCOUNTER — Other Ambulatory Visit: Payer: Self-pay | Admitting: Family Medicine

## 2017-07-08 DIAGNOSIS — F411 Generalized anxiety disorder: Secondary | ICD-10-CM

## 2017-07-08 MED ORDER — ALPRAZOLAM 0.25 MG PO TABS: 0.2500 mg | ORAL_TABLET | Freq: Three times a day (TID) | ORAL | 0 refills | Status: DC | PRN

## 2017-07-08 NOTE — Progress Notes (Signed)
Reviewed [place] Substance Reporting system prior to prescribing opiate medications. No inconsistencies noted.   Meds ordered this encounter  Medications  . ALPRAZolam (XANAX) 0.25 MG tablet    Sig: Take 1 tablet (0.25 mg total) by mouth 3 (three) times daily as needed for anxiety.    Dispense:  30 tablet    Refill:  0    Order Specific Question:   Supervising Provider    Answer:   [name], [name] [[id number]][[id number]]    Nolon NationsLaChina Moore Hollis  MSN, FNP-C [facility] [address]  [place], [place] [address] [phone]

## 2017-07-22 ENCOUNTER — Telehealth: Payer: Self-pay | Admitting: Internal Medicine

## 2017-07-22 MED ORDER — ALBUTEROL SULFATE (2.5 MG/3ML) 0.083% IN NEBU: 2.5000 mg | INHALATION_SOLUTION | RESPIRATORY_TRACT | 11 refills | Status: DC | PRN

## 2017-07-22 NOTE — Telephone Encounter (Signed)
Pt requesting refill on albuterol neb solution.  This has been sent to preferred pharmacy.  Nothing further needed.  

## 2017-08-13 ENCOUNTER — Ambulatory Visit (INDEPENDENT_AMBULATORY_CARE_PROVIDER_SITE_OTHER): Payer: Medicaid Other | Admitting: Internal Medicine

## 2017-08-13 ENCOUNTER — Encounter: Payer: Self-pay | Admitting: Family Medicine

## 2017-08-13 ENCOUNTER — Other Ambulatory Visit: Payer: Self-pay | Admitting: Internal Medicine

## 2017-08-13 ENCOUNTER — Ambulatory Visit (INDEPENDENT_AMBULATORY_CARE_PROVIDER_SITE_OTHER): Payer: Medicaid Other | Admitting: Family Medicine

## 2017-08-13 ENCOUNTER — Other Ambulatory Visit: Payer: Self-pay

## 2017-08-13 ENCOUNTER — Encounter: Payer: Self-pay | Admitting: Internal Medicine

## 2017-08-13 VITALS — BP 150/79 | HR 80 | Temp 98.2°F | Resp 14 | Ht 68.0 in | Wt 327.0 lb

## 2017-08-13 VITALS — BP 114/78 | HR 80 | Ht 68.0 in | Wt 326.0 lb

## 2017-08-13 DIAGNOSIS — R7309 Other abnormal glucose: Secondary | ICD-10-CM

## 2017-08-13 DIAGNOSIS — J9611 Chronic respiratory failure with hypoxia: Secondary | ICD-10-CM

## 2017-08-13 DIAGNOSIS — J449 Chronic obstructive pulmonary disease, unspecified: Secondary | ICD-10-CM

## 2017-08-13 DIAGNOSIS — L309 Dermatitis, unspecified: Secondary | ICD-10-CM | POA: Diagnosis not present

## 2017-08-13 DIAGNOSIS — J9612 Chronic respiratory failure with hypercapnia: Secondary | ICD-10-CM

## 2017-08-13 DIAGNOSIS — F411 Generalized anxiety disorder: Secondary | ICD-10-CM | POA: Diagnosis not present

## 2017-08-13 DIAGNOSIS — Z23 Encounter for immunization: Secondary | ICD-10-CM

## 2017-08-13 DIAGNOSIS — I5032 Chronic diastolic (congestive) heart failure: Secondary | ICD-10-CM | POA: Diagnosis not present

## 2017-08-13 DIAGNOSIS — G629 Polyneuropathy, unspecified: Secondary | ICD-10-CM

## 2017-08-13 DIAGNOSIS — Z9981 Dependence on supplemental oxygen: Secondary | ICD-10-CM

## 2017-08-13 MED ORDER — BUDESONIDE-FORMOTEROL FUMARATE 160-4.5 MCG/ACT IN AERO: INHALATION_SPRAY | RESPIRATORY_TRACT | 11 refills | Status: DC

## 2017-08-13 MED ORDER — BLOOD GLUCOSE MONITOR KIT: PACK | 0 refills | Status: DC

## 2017-08-13 MED ORDER — CARVEDILOL 6.25 MG PO TABS: 6.2500 mg | ORAL_TABLET | Freq: Two times a day (BID) | ORAL | 1 refills | Status: DC

## 2017-08-13 MED ORDER — BUDESONIDE-FORMOTEROL FUMARATE 160-4.5 MCG/ACT IN AERO: 2.0000 | INHALATION_SPRAY | Freq: Two times a day (BID) | RESPIRATORY_TRACT | 0 refills | Status: DC

## 2017-08-13 MED ORDER — ALPRAZOLAM 0.25 MG PO TABS: 0.2500 mg | ORAL_TABLET | Freq: Three times a day (TID) | ORAL | 0 refills | Status: DC | PRN

## 2017-08-13 MED ORDER — GABAPENTIN 400 MG PO CAPS: ORAL_CAPSULE | ORAL | 2 refills | Status: DC

## 2017-08-13 MED ORDER — FUROSEMIDE 40 MG PO TABS: 40.0000 mg | ORAL_TABLET | Freq: Every day | ORAL | 1 refills | Status: DC

## 2017-08-13 MED ORDER — TRIAMCINOLONE ACETONIDE 0.5 % EX OINT: 1.0000 "application " | TOPICAL_OINTMENT | Freq: Two times a day (BID) | CUTANEOUS | 0 refills | Status: DC

## 2017-08-13 MED ORDER — ALBUTEROL SULFATE HFA 108 (90 BASE) MCG/ACT IN AERS: 2.0000 | INHALATION_SPRAY | Freq: Four times a day (QID) | RESPIRATORY_TRACT | 2 refills | Status: DC | PRN

## 2017-08-13 NOTE — Progress Notes (Signed)
Subjective:     Patient ID: Kenneth Casey, male   DOB: October 28, 1956    MRN: [id number]    Brief patient profile:  67 yowm furniture salesman Quit smoking around 2012 at wt around 210 and did fine until winter of 2016 cough/sob self rx with albuterol helped some hfa and neb then added BREO admit with CAP/ HCAP> 02 dep post d/c   Admit date: 12/13/2015 Discharge date: 12/15/2015   Discharge Diagnoses:  1. Healthcare associated pneumonia. 2. Acute on chronic respiratory failure with hypoxia. 3. COPD without exacerbation. 4. Chronic diastolic congestive heart failure. Remain compensated. 5. Morbid obesity. 6. Mild normocytic anemia, in part, hemodilutional. 7. Lactic acidosis.  Discharge Condition: Improved.  Diet recommendation: Heart healthy.  Filed Weights   12/13/15 1116 12/13/15 1457  Weight: 141.522 kg (312 lb) 142.883 kg (315 lb)    History of present illness:  The patient is a 61 year old man with history of oxygen dependent COPD. He was recently hospitalized in December for septic shock secondary to community-acquired pneumonia which required intubation. He had been doing fairly well at home until approximately 3 days prior to his presentation to the ED again on 12/12/2014. He complained of shortness of breath, coughing, runny nose, and a headache. In the ED, his chest x-ray revealed right middle lobe atelectasis or infiltrate/airspace disease. His lactic acid was 2.41. He was slightly more hypoxic than usual, oxygenating 88-90% on his usual 3 L of oxygen. He was admitted for further evaluation and management.  Hospital Course:   1. Healthcare associated pneumonia. The patient was recently hospitalized in December after a prolonged stay with ventilator dependent respiratory failure secondary to septic shock, pneumonia, and hypoxic respiratory failure. He returned on 12/13/15 with shortness of breath and cough. Chest x-ray in the ED revealed right middle lobe atelectasis  versus airspace disease. ABG on 2 L of oxygen on admission revealed a pH of 7.4, PCO2 of 46, and PO2 of 67. Cefepime and vancomycin were started. He was continued on oxygen. -Influenza was negative. HIV was nonreactive. Blood cultures were negative to date. Urine Legionella antigen was pending at the time of discharge. Strep will antigen was negative. -Patient improved clinically and symptomatically. His oxygen saturations improved progressively. He did not have significant wheezing, however, at the time of discharge, it was felt that a few days of prednisone would be helpful. Therefore, he was discharged on 3 more days of prednisone along with 5 more days of Ceftin for ongoing treatment. He was also given a prescription for albuterol nebulizer to be used 3 times a day for 5 more days and then every 4-6 hours thereafter. He was encouraged to wear his oxygen more consistently at home.  Acute on chronic respiratory failure with hypoxia. Patient is treated with 2-3 L of nasal cannula oxygen. His oxygen saturations were lower than his normal range on admission. Oxygen was titrated to keep his oxygen saturations 90% or greater. His wife stated that the patient did not wear his oxygen 24/7. Patient was encouraged to wear his oxygen close 24 hours a day 7 days a week as possible. He voiced understanding.  Chronic oxygen dependent COPD. He was continued on Symbicort. There was no significant wheezing on admission, but he did have occasional wheezes prior to discharge, therefore, he was discharged on a very short prednisone taper.   Chronic diastolic heart failure. His heart failure was compensated. He was continued on Lasix.       12/21/2015 [facility] office visit/  [name]   Chief Complaint  Patient presents with  . HFU    Pt states that his breathing has improved some, but not baseline for him. He c/o chest congestion and has had some cough with clear sputum. He has been wheezing some. He is  using albuterol inhaler 4-5 x per day and neb 2-4 x per day.   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  Plan B= Backup Only use your albuterol as a rescue medication to  Plan C = crisis Only use nebulizer if you try the ventolin first and it doesn't work  Try prilosec otc   Take 30-60 min before first meal of the day and Pepcid ac (famotidine) 20 mg one @  bedtime until cough is completely gone for at least a week without the need for cough suppression GERD diet      01/17/2016  f/u ov/[name] re:  GOLD III copd/ maint on symbicort 160 when he can afford it  Chief Complaint  Patient presents with  . Follow-up    Breathing continues to improve. He is coughing less. Sometimes prod in the am's with clear to yellow sputum.   doe = MMRC1 = can walk nl pace, flat grade, can't hurry or go uphills or steps s sob   Rare need for saba / almost no neb use at all/ main challenge is paying for care  rec  No need for 02 at rest just use at bedtime and with activity    04/16/2016  f/u ov/[name] re: GOLD III copd with restrictive change from obesity / symb 160 2bid but hfa poor  Chief Complaint  Patient presents with  . Follow-up    Pt c/o cough with clear-yellow mucus, wheeze, SOB and some chest tightness. Pt did [place] yesterday, did not fo under water but received some scrapes and bruises but otherwise is ok.   4lpm  pulsed  walking /2.5 sleeping and prn at rest  Doe x MMRC2 = can't walk a nl pace on a flat grade s sob but does fine slow and flat eg grocery shopping  avg saba once a day hfa and neb same / better when has access to symbicort  rec Work on inhaler technique:   Plan A = Automatic = Symbicort 160 Take 2 puffs first thing in am  and then another 2 puffs about 12 hours later.  Plan B = Backup Only use your albuterol as a rescue medication Plan C = Crisis - only use your albuterol nebulizer if you first try Plan B and it fails to help > ok to use the nebulizer up to every 4 hours but if start needing it regularly call for immediate appointment 02 2.5 lpm at bedtime, pulsed at 4 lpm with activity/ no 02 at rest    05/28/2016  f/u ov/Aaryav Hopfensperger re: GOLD III copd  symbicort 160 2bid/ 02 with ex and hs  Chief Complaint  Patient presents with  . Follow-up    Breathing worse here recently due to increased humidity. Cough is unchanged. He is using rescue inhaler at least 3 x per day and neb 2 x every night.  slowed down by L Fem nerve burning/ heat, but does fine on 02 with indoor walking  rec Plan A = Automatic =  Try Bevespi Take 2 puffs first thing in am and then another 2 puffs about 12 hours later. Plan B = Backup Only use your albuterol as a rescue medication  Plan C = Crisis - only use your albuterol nebulizer if you first try Plan B and it fails to help > ok to use the nebulizer up to every 4 hours but if start needing it regularly call for immediate appointment Please schedule a follow up visit in 3 months but call sooner if needed     09/03/2016  f/u ov/Mong Neal re:  symbicort 160 2bid/ 02 2.5 lpm hs/ ex on 2lpm Chief Complaint  Patient presents with  . Follow-up    Pt. states he likes being on the Bevespi still using the sample that he was given, Pt. states this week he doing a little better, coughing in the am, some wheezing  needing albuterol 1-2 x daily which is less than previous experience rec Plan A = Automatic = Bevespi(or symbicort)  Take 2 puffs first thing in am and then another 2 puffs about 12 hours later.  Plan B = Backup Only use your albuterol  Plan C = Crisis - only use your albuterol nebulizer if you first try Plan B and it fails to help > ok to use the nebulizer up to every 4 hours but if start  needing it regularly call for immediate appointment 02 2.5 lpm at bedtime, pulsed at 4 lpm with activity/ no 02 at rest     02/13/2017  f/u ov/Jawanna Dykman re:  GOLD III / 02 hs and with activity / preferred symbicort over bevespi  Chief Complaint  Patient presents with  . Follow-up    COPD follow up. Denies any increase in SOB or chest pain. Breathing has been ok.   sleeps 45 degree recliner/ no excess daytime sleepiness Shops x 20 min then has to sit down / leg pain  and sob both  limiting  Maybe saba once a day  rec No change in medications or 0xygen regimen  Weight control is simply a matter of calorie balance    08/13/2017  f/u ov/Avonte Sensabaugh re: GOLD III / 2lpm hs / with activity / symbicort 80 2bid with poor hfa  Chief Complaint  Patient presents with  . Follow-up    He states noticing some congestion in his chest over the past 2 days. He is not coughing much. He did produce some yellow sputum this am in the shower.   limited by burning pain knee to hip L side only / stays numb all the time  mb and back to house is steep and 50 ft and into house usually s stopping and s 02  saba twice daily if overdoes it   No obvious day to day or daytime variability or assoc   mucus plugs or hemoptysis or cp or chest tightness, subjective wheeze or overt sinus or hb symptoms. No unusual exp hx or h/o childhood pna/ asthma or knowledge of premature birth.  Sleeping ok without nocturnal  or early am exacerbation  of respiratory  c/o's or need for noct saba. Also denies any obvious fluctuation of symptoms with weather or environmental changes or other aggravating or alleviating factors except as outlined above   Current Medications, Allergies, Complete Past Medical History, Past Surgical History, Family History, and Social History were reviewed in  [facility] electronic medical record.  ROS  The following are not active complaints unless bolded sore throat, dysphagia, dental problems, itching, sneezing,   nasal congestion or excess/ purulent secretions, ear ache,   fever, chills, sweats, unintended wt loss, classically pleuritic or exertional cp,  orthopnea pnd or leg swelling, presyncope, palpitations, abdominal pain, anorexia, nausea, vomiting, diarrhea  or change in bowel or bladder habits, change in stools or urine, dysuria,hematuria,  rash, arthralgias, visual complaints, headache, numbness, weakness or ataxia or problems with walking or coordination,  change in mood/affect or memory.           Objective:   Physical Exam    obese amb wm    04/16/2016          309 >  05/28/2016  305 > 09/03/2016   318 >  02/13/2017  323 >  08/13/2017  326  01/17/2016          311   12/20/15 308 lb (139.708 kg)  12/13/15 315 lb (142.883 kg)  12/08/15 312 lb (141.522 kg)    Vital signs reviewed  - sats 90% RA on arrival    HEENT: nl dentition, turbinates, and oropharynx. Nl external ear canals without cough reflex   NECK :  without JVD/Nodes/TM/ nl carotid upstrokes bilaterally   LUNGS: no acc muscle use,  Nl contour chest / distant BS bilaterally c min late exp wheezes     CV:  RRR  no s3 or murmur or increase in P2, no edema   ABD:  Quite obese soft and nontender with nl inspiratory excursion in the supine position. No bruits or organomegaly, bowel sounds nl  MS:  Nl gait/ ext warm without deformities, calf tenderness, cyanosis or clubbing No obvious joint restrictions   SKIN: warm and dry without lesions    NEURO:  alert, approp, nl sensorium with  no motor deficits    CXR PA and Lateral:   01/17/2016 :    I personally reviewed images and agree with radiology impression as follows:   The lungs remain clear but hyperaerated consistent with and element of emphysema with flattened hemidiaphragms. Mediastinal and hilar contours are unremarkable. Heart size is stable being mildly enlarged. No acute bony abnormality is seen.      Assessment:

## 2017-08-13 NOTE — Telephone Encounter (Signed)
Refill for carvedilol, lasix, gabapentin to pharmacy. Thanks!

## 2017-08-13 NOTE — Assessment & Plan Note (Signed)
Body mass index is 49.57 kg/m.  -  trending up Lab Results  Component Value Date   TSH 2.564 12/08/2015     Contributing to gerd risk/ doe/reviewed the need and the process to achieve and maintain neg calorie balance > defer f/u primary care including intermittently monitoring thyroid status

## 2017-08-13 NOTE — Assessment & Plan Note (Signed)
On 02 3lpm since d/c 11/18/2015 - HCO3  34  12/14/15  - 01/17/2016 sats mid 90's RA at rest so rec 2lpm sleeping / exerting but none needed at rest  - 04/16/2016  Walked 4lpm pulsed x 3 laps @ 185 ft each stopped due to end of study, mild sob, no desat  - 08/13/2017  Walked RA x 3 laps @ 185 ft each stopped due to  End of study, nl to mod fast pace, no desat , min sob   - ONO RA 08/13/2017 >>>   rec as of 08/13/2017 = 2  lpm sleeping/  4  pulsed with ex only (not needed with nl ambulation)

## 2017-08-13 NOTE — Assessment & Plan Note (Signed)
12/20/2015  extensive coaching HFA effectiveness =    75% > continue symbicort 160 2bid - Spirometry 01/17/2016  FEV1 1.62 (44%)  Ratio 61 - 05/28/2016   try bevespi 2bid > improved 09/03/2016 but decided liked symbicort better = 80 bid  - 08/13/2017  After extensive coaching HFA effectiveness =  75% (short Ti) > try symb 160  He is having a mild flare on symb 80 so rec better hfa and change to symb 160 2bid   I had an extended discussion with the patient reviewing all relevant studies completed to date and  lasting 15 to 20 minutes of a 25 minute visit    Each maintenance medication was reviewed in detail including most importantly the difference between maintenance and prns and under what circumstances the prns are to be triggered using an action plan format that is not reflected in the computer generated alphabetically organized AVS.    Please see AVS for specific instructions unique to this visit that I personally wrote and verbalized to the the pt in detail and then reviewed with pt  by my nurse highlighting any  changes in therapy recommended at today's visit to their plan of care.

## 2017-08-13 NOTE — Progress Notes (Signed)
Patient ID: Kenneth Casey, male    DOB: 05-04-1956, 61 y.o.   MRN: [id number]  PCP: Kenneth Dew, FNP  Chief Complaint  Patient presents with  . Follow-up    3 month    Subjective:  HPI Kenneth Casey is a 61 y.o. male presents for a 3 month follow-up pf chronic condition and medication refill for Xanax. Kenneth Casey suffers from oxygen dependency secondary to CHF, COPD, obesity and chronic hypoxia. He complains of skin irritation behind his bilateral ears from chronic wearing of nasal cannula. Reports associated skin flaking and irritation along the hair line as well.  He has attempted relief of hairline irritation with non-flaking shampoo without relief of symptoms. COPD symptoms managed with avoidance of heat, albuterol nebulizer treatments, andSymbicort. Baseline oxygen saturation rate 86 on RA and with 2 liters of oxygen 92-93%.  He is followed by Dr. Melvyn Novas at [facility] pulmonology. Kenneth Casey also suffers from CHF. Reports adherence to cardiac medications. He has not had any recent cardiology follow-up although denies symptoms of lower extremity swelling, unintentional weight gain, or wet hacking cough. Last echocardiogram 2016, EF-55%-60% ., which showed grade 1 diastolic failure. Kenneth Casey doesn't have a history of diabetes although he has had prior elevated glucose readings. Last A1C  6.0 3 months prior which reflected prediabetes. He reports no associated polyuria, polydipsia, or polyphagia. Current Body mass index is 49.72 kg/m.  Due to complex chronic disease processes he remains very sedentary and is unable to participate on physical exercise.  Social History   Social History  . Marital status: Married    Spouse name: N/A  . Number of children: N/A  . Years of education: N/A   Occupational History  . Not on file.   Social History Main Topics  . Smoking status: Former Smoker    Packs/day: 2.00    Years: 30.00    Types: Cigarettes    Quit date: 01/08/2011  . Smokeless tobacco: Never  Used  . Alcohol use No  . Drug use: No  . Sexual activity: Not on file   Other Topics Concern  . Not on file   Social History Narrative  . No narrative on file    Family History  Problem Relation Age of Onset  . Cancer Mother        breast cancer, lung cancer  . Diabetes Father   . Cancer Maternal Aunt   . Heart disease Paternal Uncle    Review of Systems See HPI Patient Active Problem List   Diagnosis Date Noted  . Chronic respiratory failure with hypoxia and hypercapnia ([place]) 12/21/2015  . Obesity 12/14/2015  . COPD GOLD III 12/14/2015  . Chronic diastolic CHF (congestive heart failure) ([place]) 12/13/2015  . Diastolic dysfunction 76/16/0737  . Severe obesity (BMI >= 40) ([place]) 12/08/2015  . Leukocytosis 12/08/2015  . On home oxygen therapy 12/08/2015  . Bilateral edema of lower extremity 12/08/2015  . Sinus tachycardia 12/08/2015  . Insomnia 12/08/2015    Allergies  Allergen Reactions  . Adhesive [Tape] Rash    Prior to Admission medications   Medication Sig Start Date End Date Taking? Authorizing Provider  acetaminophen (TYLENOL) 500 MG tablet Take 1,000 mg by mouth every 6 (six) hours as needed for moderate pain.   Yes [provider]  albuterol (PROVENTIL HFA;VENTOLIN HFA) 108 (90 Base) MCG/ACT inhaler Inhale 2 puffs into the lungs every 6 (six) hours as needed for wheezing or shortness of breath. 01/29/17  Yes Kenneth Dew, FNP  albuterol (  PROVENTIL) (2.5 MG/3ML) 0.083% nebulizer solution Take 3 mLs (2.5 mg total) by nebulization every 4 (four) hours as needed for wheezing or shortness of breath. DX: J44.9 07/22/17  Yes Tanda Rockers, MD  ALPRAZolam Duanne Moron) 0.25 MG tablet Take 1 tablet (0.25 mg total) by mouth 3 (three) times daily as needed for anxiety. 07/08/17  Yes Kenneth Dew, FNP  budesonide-formoterol (SYMBICORT) 80-4.5 MCG/ACT inhaler Inhale 2 puffs into the lungs 2 (two) times daily. 02/13/17  Yes Tanda Rockers, MD  busPIRone (BUSPAR) 10  MG tablet TAKE 1 TABLET BY MOUTH TWICE DAILY 07/04/17  Yes Kenneth Dew, FNP  carvedilol (COREG) 6.25 MG tablet Take 1 tablet (6.25 mg total) by mouth 2 (two) times daily with a meal. 04/23/17  Yes Kenneth Dew, FNP  fluticasone (FLONASE) 50 MCG/ACT nasal spray Place 2 sprays into both nostrils daily as needed for allergies or rhinitis.   Yes [provider]  furosemide (LASIX) 40 MG tablet Take 1 tablet (40 mg total) by mouth daily. 06/03/17  Yes Kenneth Dew, FNP  gabapentin (NEURONTIN) 400 MG capsule 400 mg twice daily 05/14/17  Yes Kenneth Dew, FNP  OXYGEN 2.5 with sleep and exertion   Yes [provider]  cetirizine (ZYRTEC) 10 MG tablet Take 1 tablet (10 mg total) by mouth daily. Patient not taking: Reported on 08/13/2017 05/14/17   Kenneth Dew, FNP    Past Medical, Surgical Family and Social History reviewed and updated.    Objective:   Today's Vitals   08/13/17 1117  BP: (!) 150/79  Pulse: 80  Resp: 14  Temp: 98.2 F (36.8 C)  TempSrc: Oral  SpO2: 95%  Weight: (!) 327 lb (148.3 kg)  Height: '5\' 8"'  (1.727 m)    Wt Readings from Last 3 Encounters:  08/13/17 (!) 327 lb (148.3 kg)  05/14/17 (!) 330 lb (149.7 kg)  03/19/17 (!) 328 lb (148.8 kg)   Physical Exam  Constitutional: He is oriented to person, place, and time. He appears well-developed and well-nourished.  HENT:  Head: Normocephalic and atraumatic.  Eyes: Pupils are equal, round, and reactive to light. Conjunctivae are normal.  Cardiovascular: Normal rate, regular rhythm, normal heart sounds and intact distal pulses.   Pulmonary/Chest: Effort normal and breath sounds normal.  Neurological: He is alert and oriented to person, place, and time.  Psychiatric: He has a normal mood and affect. His behavior is normal. Judgment and thought content normal.   Assessment & Plan:  1. Stage 3 severe COPD by GOLD classification ([place]), stable-Continue current regimen and keep pulmonology  follow-up.  2. Generalized anxiety disorder, secondary to chronic hypoxia  3. Elevated glucose, repeating A1C today, previously prediabetic range  4. Chronic diastolic heart failure ([place]), stable 5. Need for influenza vaccination - Flu Vaccine QUAD 36+ mos IM 6. Dermatitis, secondary to irritation from oxygen tubing. Treat with application steriodal ointment twice daily.  Orders Placed This Encounter  Procedures  . Flu Vaccine QUAD 36+ mos IM  . Brain natriuretic peptide  . Basic metabolic panel  . Hemoglobin A1c   Meds ordered this encounter  Medications  . DISCONTD: blood glucose meter kit and supplies KIT    Sig: Dispense based on patient and insurance preference. Use up to four times daily as directed. (FOR ICD-10 E.11).    Dispense:  1 each    Refill:  0    Order Specific Question:   Supervising Provider    Answer:   Angelica Chessman  E [[id number]]    Order Specific Question:   Number of strips    Answer:   150    Order Specific Question:   Number of lancets    Answer:   150  . ALPRAZolam (XANAX) 0.25 MG tablet    Sig: Take 1 tablet (0.25 mg total) by mouth 3 (three) times daily as needed for anxiety.    Dispense:  30 tablet    Refill:  0    Order Specific Question:   Supervising Provider    Answer:   Tresa Garter [id number]  . triamcinolone ointment (KENALOG) 0.5 %    Sig: Apply 1 application topically 2 (two) times daily.    Dispense:  30 g    Refill:  0    Order Specific Question:   Supervising Provider    Answer:   Tresa Garter [[id number]]    RTC: 3 months for routine follow-up of chronic disease management    Carroll Sage. Kenton Kingfisher, MSN, FNP-C [facility] [place]  [address] Barbara Cower [place], [place] [address] [phone]

## 2017-08-13 NOTE — Patient Instructions (Signed)
Purchase any tea green shampoo to reducing flanking. Apply triamcinolone cream to the scalp regions that are irritated twice daily.  Flaking and itching is likely related to dermatitis of the scalp.  If leg swelling worsens or weight increases greater than 2.5 lbs in 24 hours and or 5 lb within 1 week, return for follow-up immediately.    Seborrheic Dermatitis, Adult Seborrheic dermatitis is a skin disease that causes red, scaly patches. It usually occurs on the scalp, and it is often called dandruff. The patches may appear on other parts of the body. Skin patches tend to appear where there are many oil glands in the skin. Areas of the body that are commonly affected include:  Scalp.  Skin folds of the body.  Ears.  Eyebrows.  Neck.  Face.  Armpits.  The bearded area of men's faces.  The condition may come and go for no known reason, and it is often long-lasting (chronic). What are the causes? The cause of this condition is not known. What increases the risk? This condition is more likely to develop in people who:  Have certain conditions, such as: ? HIV (human immunodeficiency virus). ? AIDS (acquired immunodeficiency syndrome). ? Parkinson disease. ? Mood disorders, such as depression.  Are 11-22 years old.  What are the signs or symptoms? Symptoms of this condition include:  Thick scales on the scalp.  Redness on the face or in the armpits.  Skin that is flaky. The flakes may be white or yellow.  Skin that seems oily or dry but is not helped with moisturizers.  Itching or burning in the affected areas.  How is this diagnosed? This condition is diagnosed with a medical history and physical exam. A sample of your skin may be tested (skin biopsy). You may need to see a skin specialist (dermatologist). How is this treated? There is no cure for this condition, but treatment can help to manage the symptoms. You may get treatment to remove scales, lower the risk  of skin infection, and reduce swelling or itching. Treatment may include:  Creams that reduce swelling and irritation (steroids).  Creams that reduce skin yeast.  Medicated shampoo, soaps, moisturizing creams, or ointments.  Medicated moisturizing creams or ointments.  Follow these instructions at home:  Apply over-the-counter and prescription medicines only as told by your health care provider.  Use any medicated shampoo, soaps, skin creams, or ointments only as told by your health care provider.  Keep all follow-up visits as told by your health care provider. This is important. Contact a health care provider if:  Your symptoms do not improve with treatment.  Your symptoms get worse.  You have new symptoms. This information is not intended to replace advice given to you by your health care provider. Make sure you discuss any questions you have with your health care provider. Document Released: 11/26/2005 Document Revised: 06/15/2016 Document Reviewed: 03/15/2016 Elsevier Interactive Patient Education  2018 [employer]  Heart Failure Heart failure is a condition in which the heart has trouble pumping blood because it has become weak or stiff. This means that the heart does not pump blood efficiently for the body to work well. For some people with heart failure, fluid may back up into the lungs and there may be swelling (edema) in the lower legs. Heart failure is usually a long-term (chronic) condition. It is important for you to take good care of yourself and follow the treatment plan from your health care provider. What are the causes?  This condition is caused by some health problems, including:  High blood pressure (hypertension). Hypertension causes the heart muscle to work harder than normal. High blood pressure eventually causes the heart to become stiff and weak.  Coronary artery disease (CAD). CAD is the buildup of cholesterol and fat (plaques) in the arteries of the  heart.  Heart attack (myocardial infarction). Injured tissue, which is caused by the heart attack, does not contract as well and the heart's ability to pump blood is weakened.  Abnormal heart valves. When the heart valves do not open and close properly, the heart muscle must pump harder to keep the blood flowing.  Heart muscle disease (cardiomyopathy or myocarditis). Heart muscle disease is damage to the heart muscle from a variety of causes, such as drug or alcohol abuse, infections, or unknown causes. These can increase the risk of heart failure.  Lung disease. When the lungs do not work properly, the heart must work harder.  What increases the risk? Risk of heart failure increases as a person ages. This condition is also more likely to develop in people who:  Are overweight.  Are male.  Smoke or chew tobacco.  Abuse alcohol or illegal drugs.  Have taken medicines that can damage the heart, such as chemotherapy drugs.  Have diabetes. ? High blood sugar (glucose) is associated with high fat (lipid) levels in the blood. ? Diabetes can also damage tiny blood vessels that carry nutrients to the heart muscle.  Have abnormal heart rhythms.  Have thyroid problems.  Have low blood counts (anemia).  What are the signs or symptoms? Symptoms of this condition include:  Shortness of breath with activity, such as when climbing stairs.  Persistent cough.  Swelling of the feet, ankles, legs, or abdomen.  Unexplained weight gain.  Difficulty breathing when lying flat (orthopnea).  Waking from sleep because of the need to sit up and get more air.  Rapid heartbeat.  Fatigue and loss of energy.  Feeling light-headed, dizzy, or close to fainting.  Loss of appetite.  Nausea.  Increased urination during the night (nocturia).  Confusion.  How is this diagnosed? This condition is diagnosed based on:  Medical history, symptoms, and a physical exam.  Diagnostic tests, which  may include: ? Echocardiogram. ? Electrocardiogram (ECG). ? Chest X-ray. ? Blood tests. ? Exercise stress test. ? Radionuclide scans. ? Cardiac catheterization and angiogram.  How is this treated? Treatment for this condition is aimed at managing the symptoms of heart failure. Medicines, behavioral changes, or other treatments may be necessary to treat heart failure. Medicines These may include:  Angiotensin-converting enzyme (ACE) inhibitors. This type of medicine blocks the effects of a blood protein called angiotensin-converting enzyme. ACE inhibitors relax (dilate) the blood vessels and help to lower blood pressure.  Angiotensin receptor blockers (ARBs). This type of medicine blocks the actions of a blood protein called angiotensin. ARBs dilate the blood vessels and help to lower blood pressure.  Water pills (diuretics). Diuretics cause the kidneys to remove salt and water from the blood. The extra fluid is removed through urination, leaving a lower volume of blood that the heart has to pump.  Beta blockers. These improve heart muscle strength and they prevent the heart from beating too quickly.  Digoxin. This increases the force of the heartbeat.  Healthy behavior changes These may include:  Reaching and maintaining a healthy weight.  Stopping smoking or chewing tobacco.  Eating heart-healthy foods.  Limiting or avoiding alcohol.  Stopping use of street  drugs (illegal drugs).  Physical activity.  Other treatments These may include:  Surgery to open blocked coronary arteries or repair damaged heart valves.  Placement of a biventricular pacemaker to improve heart muscle function (cardiac resynchronization therapy). This device paces both the right ventricle and left ventricle.  Placement of a device to treat serious abnormal heart rhythms (implantable cardioverter defibrillator, or ICD).  Placement of a device to improve the pumping ability of the heart (left  ventricular assist device, or LVAD).  Heart transplant. This can cure heart failure, and it is considered for certain patients who do not improve with other therapies.  Follow these instructions at home: Medicines  Take over-the-counter and prescription medicines only as told by your health care provider. Medicines are important in reducing the workload of your heart, slowing the progression of heart failure, and improving your symptoms. ? Do not stop taking your medicine unless your health care provider told you to do that. ? Do not skip any dose of medicine. ? Refill your prescriptions before you run out of medicine. You need your medicines every day. Eating and drinking   Eat heart-healthy foods. Talk with a dietitian to make an eating plan that is right for you. ? Choose foods that contain no trans fat and are low in saturated fat and cholesterol. Healthy choices include fresh or frozen fruits and vegetables, fish, lean meats, legumes, fat-free or low-fat dairy products, and whole-grain or high-fiber foods. ? Limit salt (sodium) if directed by your health care provider. Sodium restriction may reduce symptoms of heart failure. Ask a dietitian to recommend heart-healthy seasonings. ? Use healthy cooking methods instead of frying. Healthy methods include roasting, grilling, broiling, baking, poaching, steaming, and stir-frying.  Limit your fluid intake if directed by your health care provider. Fluid restriction may reduce symptoms of heart failure. Lifestyle  Stop smoking or using chewing tobacco. Nicotine and tobacco can damage your heart and your blood vessels. Do not use nicotine gum or patches before talking to your health care provider.  Limit alcohol intake to no more than 1 drink per day for non-pregnant women and 2 drinks per day for men. One drink equals 12 oz of beer, 5 oz of wine, or 1 oz of hard liquor. ? Drinking more than that is harmful to your heart. Tell your health care  provider if you drink alcohol several times a week. ? Talk with your health care provider about whether any level of alcohol use is safe for you. ? If your heart has already been damaged by alcohol or you have severe heart failure, drinking alcohol should be stopped completely.  Stop use of illegal drugs.  Lose weight if directed by your health care provider. Weight loss may reduce symptoms of heart failure.  Do moderate physical activity if directed by your health care provider. People who are elderly and people with severe heart failure should consult with a health care provider for physical activity recommendations. Monitor important information  Weigh yourself every day. Keeping track of your weight daily helps you to notice excess fluid sooner. ? Weigh yourself every morning after you urinate and before you eat breakfast. ? Wear the same amount of clothing each time you weigh yourself. ? Record your daily weight. Provide your health care provider with your weight record.  Monitor and record your blood pressure as told by your health care provider.  Check your pulse as told by your health care provider. Dealing with extreme temperatures  If the weather  is extremely hot: ? Avoid vigorous physical activity. ? Use air conditioning or fans or seek a cooler location. ? Avoid caffeine and alcohol. ? Wear loose-fitting, lightweight, and light-colored clothing.  If the weather is extremely cold: ? Avoid vigorous physical activity. ? Layer your clothes. ? Wear mittens or gloves, a hat, and a scarf when you go outside. ? Avoid alcohol. General instructions  Manage other health conditions such as hypertension, diabetes, thyroid disease, or abnormal heart rhythms as told by your health care provider.  Learn to manage stress. If you need help to do this, ask your health care provider.  Plan rest periods when fatigued.  Get ongoing education and support as needed.  Participate in or  seek rehabilitation as needed to maintain or improve independence and quality of life.  Stay up to date with immunizations. Keeping current on pneumococcal and influenza immunizations is especially important to prevent respiratory infections.  Keep all follow-up visits as told by your health care provider. This is important. Contact a health care provider if:  You have a rapid weight gain.  You have increasing shortness of breath that is unusual for you.  You are unable to participate in your usual physical activities.  You tire easily.  You cough more than normal, especially with physical activity.  You have any swelling or more swelling in areas such as your hands, feet, ankles, or abdomen.  You are unable to sleep because it is hard to breathe.  You feel like your heart is beating quickly (palpitations).  You become dizzy or light-headed when you stand up. Get help right away if:  You have difficulty breathing.  You notice or your family notices a change in your awareness, such as having trouble staying awake or having difficulty with concentration.  You have pain or discomfort in your chest.  You have an episode of fainting (syncope). This information is not intended to replace advice given to you by your health care provider. Make sure you discuss any questions you have with your health care provider. Document Released: 11/26/2005 Document Revised: 07/31/2016 Document Reviewed: 06/20/2016 Elsevier Interactive Patient Education  2017 [employer].

## 2017-08-13 NOTE — Patient Instructions (Signed)
Work on inhaler technique:  relax and gently blow all the way out then take a nice smooth deep breath back in, triggering the inhaler at same time you start breathing in.  Hold for up to 5 seconds if you can. Blow out thru nose. Rinse and gargle with water when done  Change symbicort to 160 Take 2 puffs first thing in am and then another 2 puffs about 12 hours later.        To get the most out of exercise, you need to be continuously aware that you are short of breath, but never out of breath, for 30 minutes daily. As you improve, it will actually be easier for you to do the same amount of exercise  in  30 minutes so always push to the level where you are short of breath.   Let me know if you decide you want to try pulmonary rehab  Please see patient coordinator before you leave today  to schedule overnight 02 saturations on room air

## 2017-08-14 ENCOUNTER — Other Ambulatory Visit: Payer: Self-pay | Admitting: Internal Medicine

## 2017-08-14 ENCOUNTER — Ambulatory Visit: Payer: Medicaid Other | Admitting: Family Medicine

## 2017-08-14 LAB — BASIC METABOLIC PANEL
BUN: 17 mg/dL (ref 7–25)
CHLORIDE: 99 mmol/L (ref 98–110)
CO2: 29 mmol/L (ref 20–32)
Calcium: 9.1 mg/dL (ref 8.6–10.3)
Creat: 0.89 mg/dL (ref 0.70–1.33)
GLUCOSE: 100 mg/dL — AB (ref 65–99)
POTASSIUM: 4.4 mmol/L (ref 3.5–5.3)
SODIUM: 141 mmol/L (ref 135–146)

## 2017-08-14 LAB — HEMOGLOBIN A1C
HEMOGLOBIN A1C: 5.9 % — AB (ref <5.7)
Mean Plasma Glucose: 123 mg/dL

## 2017-08-14 LAB — BRAIN NATRIURETIC PEPTIDE: Brain Natriuretic Peptide: 9.9 pg/mL (ref <100)

## 2017-08-14 NOTE — Telephone Encounter (Signed)
Called [employer] for the PA for the symbicort 160.  This has been approved x 365 days.    Phone approval code is : [id number]. Pharmacy is aware.

## 2017-09-09 ENCOUNTER — Telehealth: Payer: Self-pay

## 2017-09-09 ENCOUNTER — Other Ambulatory Visit: Payer: Self-pay | Admitting: Family Medicine

## 2017-09-09 DIAGNOSIS — F411 Generalized anxiety disorder: Secondary | ICD-10-CM

## 2017-09-09 MED ORDER — ALPRAZOLAM 0.25 MG PO TABS: 0.2500 mg | ORAL_TABLET | Freq: Three times a day (TID) | ORAL | 0 refills | Status: DC | PRN

## 2017-09-09 NOTE — Progress Notes (Signed)
Reviewed [facility] Substance Reporting system prior to prescribing opiate medications. No inconsistencies noted.    Meds ordered this encounter  Medications  . ALPRAZolam (XANAX) 0.25 MG tablet    Sig: Take 1 tablet (0.25 mg total) by mouth 3 (three) times daily as needed for anxiety.    Dispense:  30 tablet    Refill:  0    Order Specific Question:   Supervising Provider    Answer:   Quentin Angst [[id number]]    Nolon Nations  MSN, FNP-C Patient Care [facility] [address] [place], [place] [address] [phone]

## 2017-10-13 ENCOUNTER — Other Ambulatory Visit: Payer: Self-pay | Admitting: Family Medicine

## 2017-10-13 DIAGNOSIS — F411 Generalized anxiety disorder: Secondary | ICD-10-CM

## 2017-10-29 ENCOUNTER — Ambulatory Visit (INDEPENDENT_AMBULATORY_CARE_PROVIDER_SITE_OTHER): Payer: Medicaid Other | Admitting: Family Medicine

## 2017-10-29 ENCOUNTER — Ambulatory Visit (HOSPITAL_COMMUNITY)
Admission: RE | Admit: 2017-10-29 | Discharge: 2017-10-29 | Disposition: A | Discharge status: Home / Self Care | Payer: Medicaid Other | Source: Ambulatory Visit | Attending: Family Medicine | Admitting: Family Medicine

## 2017-10-29 ENCOUNTER — Encounter: Payer: Self-pay | Admitting: Family Medicine

## 2017-10-29 VITALS — BP 127/72 | HR 77 | Temp 98.1°F | Resp 18 | Ht 68.0 in | Wt 325.0 lb

## 2017-10-29 DIAGNOSIS — R0602 Shortness of breath: Secondary | ICD-10-CM | POA: Diagnosis not present

## 2017-10-29 DIAGNOSIS — J449 Chronic obstructive pulmonary disease, unspecified: Secondary | ICD-10-CM

## 2017-10-29 DIAGNOSIS — J069 Acute upper respiratory infection, unspecified: Secondary | ICD-10-CM | POA: Diagnosis not present

## 2017-10-29 DIAGNOSIS — J302 Other seasonal allergic rhinitis: Secondary | ICD-10-CM | POA: Diagnosis not present

## 2017-10-29 DIAGNOSIS — F411 Generalized anxiety disorder: Secondary | ICD-10-CM

## 2017-10-29 LAB — BASIC METABOLIC PANEL WITH GFR
BUN: 23 mg/dL (ref 7–25)
CO2: 32 mmol/L (ref 20–32)
CREATININE: 1.08 mg/dL (ref 0.70–1.33)
Calcium: 9.1 mg/dL (ref 8.6–10.3)
Chloride: 100 mmol/L (ref 98–110)
GFR, Est African American: 87 mL/min/{1.73_m2} (ref >=60)
GFR, Est Non African American: 75 mL/min/{1.73_m2} (ref >=60)
GLUCOSE: 99 mg/dL (ref 65–99)
POTASSIUM: 4.2 mmol/L (ref 3.5–5.3)
SODIUM: 139 mmol/L (ref 135–146)

## 2017-10-29 LAB — CBC WITH DIFFERENTIAL/PLATELET
Basophils Absolute: 74 cells/uL (ref 0–200)
Basophils Relative: 0.6 %
EOS ABS: 496 {cells}/uL (ref 15–500)
Eosinophils Relative: 4 %
HCT: 45.1 % (ref 38.5–50.0)
Hemoglobin: 14.7 g/dL (ref 13.2–17.1)
Lymphs Abs: 2046 cells/uL (ref 850–3900)
MCH: 27.4 pg (ref 27.0–33.0)
MCHC: 32.6 g/dL (ref 32.0–36.0)
MCV: 84 fL (ref 80.0–100.0)
MONOS PCT: 6 %
MPV: 11.2 fL (ref 7.5–12.5)
NEUTROS PCT: 72.9 %
Neutro Abs: 9040 cells/uL — ABNORMAL HIGH (ref 1500–7800)
PLATELETS: 339 10*3/uL (ref 140–400)
RBC: 5.37 10*6/uL (ref 4.20–5.80)
RDW: 13.8 % (ref 11.0–15.0)
TOTAL LYMPHOCYTE: 16.5 %
WBC: 12.4 10*3/uL — AB (ref 3.8–10.8)
WBCMIX: 744 {cells}/uL (ref 200–950)

## 2017-10-29 MED ORDER — IPRATROPIUM BROMIDE 0.02 % IN SOLN
0.5000 mg | Freq: Once | RESPIRATORY_TRACT | Status: AC
  Administered 2017-10-29: 0.5 mg via RESPIRATORY_TRACT

## 2017-10-29 MED ORDER — ALBUTEROL SULFATE (2.5 MG/3ML) 0.083% IN NEBU
2.5000 mg | INHALATION_SOLUTION | Freq: Once | RESPIRATORY_TRACT | Status: AC
  Administered 2017-10-29: 2.5 mg via RESPIRATORY_TRACT

## 2017-10-29 MED ORDER — FLUTICASONE PROPIONATE 50 MCG/ACT NA SUSP: 2.0000 | Freq: Every day | NASAL | 5 refills | Status: DC | PRN

## 2017-10-29 MED ORDER — DM-GUAIFENESIN ER 30-600 MG PO TB12: 1.0000 | ORAL_TABLET | Freq: Two times a day (BID) | ORAL | 1 refills | Status: DC

## 2017-10-29 MED ORDER — AZITHROMYCIN 250 MG PO TABS: ORAL_TABLET | ORAL | 0 refills | Status: DC

## 2017-10-29 MED ORDER — ALPRAZOLAM 0.25 MG PO TABS: 0.2500 mg | ORAL_TABLET | Freq: Three times a day (TID) | ORAL | 0 refills | Status: DC | PRN

## 2017-10-29 NOTE — Patient Instructions (Signed)
We will follow-up by phone after reviewing chest x-ray.   Chronic Obstructive Pulmonary Disease Chronic obstructive pulmonary disease (COPD) is a long-term (chronic) lung problem. When you have COPD, it is hard for air to get in and out of your lungs. The way your lungs work will never return to normal. Usually the condition gets worse over time. There are things you can do to keep yourself as healthy as possible. Your doctor may treat your condition with:  Medicines.  Quitting smoking, if you smoke.  Rehabilitation. This may involve a team of specialists.  Oxygen.  Exercise and changes to your diet.  Lung surgery.  Comfort measures (palliative care).  Follow these instructions at home: Medicines  Take over-the-counter and prescription medicines only as told by your doctor.  Talk to your doctor before taking any cough or allergy medicines. You may need to avoid medicines that cause your lungs to be dry. Lifestyle  If you smoke, stop. Smoking makes the problem worse. If you need help quitting, ask your doctor.  Avoid being around things that make your breathing worse. This may include smoke, chemicals, and fumes.  Stay active, but remember to also rest.  Learn and use tips on how to relax.  Make sure you get enough sleep. Most adults need at least 7 hours a night.  Eat healthy foods. Eat smaller meals more often. Rest before meals. Controlled breathing  Learn and use tips on how to control your breathing as told by your doctor. Try: ? Breathing in (inhaling) through your nose for 1 second. Then, pucker your lips and breath out (exhale) through your lips for 2 seconds. ? Putting one hand on your belly (abdomen). Breathe in slowly through your nose for 1 second. Your hand on your belly should move out. Pucker your lips and breathe out slowly through your lips. Your hand on your belly should move in as you breathe out. Controlled coughing  Learn and use controlled coughing  to clear mucus from your lungs. The steps are: 1. Lean your head a little forward. 2. Breathe in deeply. 3. Try to hold your breath for 3 seconds. 4. Keep your mouth slightly open while coughing 2 times. 5. Spit any mucus out into a tissue. 6. Rest and do the steps again 1 or 2 times as needed. General instructions  Make sure you get all the shots (vaccines) that your doctor recommends. Ask your doctor about a flu shot and a pneumonia shot.  Use oxygen therapy and therapy to help improve your lungs (pulmonary rehabilitation) if told by your doctor. If you need home oxygen therapy, ask your doctor if you should buy a tool to measure your oxygen level (oximeter).  Make a COPD action plan with your doctor. This helps you know what to do if you feel worse than usual.  Manage any other conditions you have as told by your doctor.  Avoid going outside when it is very hot, cold, or humid.  Avoid people who have a sickness you can catch (contagious).  Keep all follow-up visits as told by your doctor. This is important. Contact a doctor if:  You cough up more mucus than usual.  There is a change in the color or thickness of the mucus.  It is harder to breathe than usual.  Your breathing is faster than usual.  You have trouble sleeping.  You need to use your medicines more often than usual.  You have trouble doing your normal activities such as getting  dressed or walking around the house. Get help right away if:  You have shortness of breath while resting.  You have shortness of breath that stops you from: ? Being able to talk. ? Doing normal activities.  Your chest hurts for longer than 5 minutes.  Your skin color is more blue than usual.  Your pulse oximeter shows that you have low oxygen for longer than 5 minutes.  You have a fever.  You feel too tired to breathe normally. Summary  Chronic obstructive pulmonary disease (COPD) is a long-term lung problem.  The way  your lungs work will never return to normal. Usually the condition gets worse over time. There are things you can do to keep yourself as healthy as possible.  Take over-the-counter and prescription medicines only as told by your doctor.  If you smoke, stop. Smoking makes the problem worse. This information is not intended to replace advice given to you by your health care provider. Make sure you discuss any questions you have with your health care provider. Document Released: 05/14/2008 Document Revised: 05/03/2016 Document Reviewed: 07/23/2013 Elsevier Interactive Patient Education  2017 [employer]

## 2017-10-29 NOTE — Progress Notes (Signed)
Subjective:    Kenneth Casey is a 61 y.o. male with known COPD, CHF, and morbid obesity who presents complaining of increased shortness of breath, productive cough, chest congestion and fatigue.  Patient states his symptoms have been present for greater than a week.  He reports sick contacts his grandchildren have been sick with upper respiratory infections.  Patient states that he has been using albuterol nebulizer 3-4 times per day.  He denies fever, chills, sore throat, or runny nose.  Patient states that he has been sleeping in a recliner over the past week due to worsening shortness of breath.  Patient is currently on oxygen at 2 L.  Past Medical History:  Diagnosis Date  . COPD (chronic obstructive pulmonary disease) (HCC)   . Obese   . Pneumonia    Social History   Socioeconomic History  . Marital status: Married    Spouse name: Not on file  . Number of children: Not on file  . Years of education: Not on file  . Highest education level: Not on file  Social Needs  . Financial resource strain: Not on file  . Food insecurity - worry: Not on file  . Food insecurity - inability: Not on file  . Transportation needs - medical: Not on file  . Transportation needs - non-medical: Not on file  Occupational History  . Not on file  Tobacco Use  . Smoking status: Former Smoker    Packs/day: 2.00    Years: 30.00    Pack years: 60.00    Types: Cigarettes    Last attempt to quit: 01/08/2011    Years since quitting: 6.8  . Smokeless tobacco: Never Used  Substance and Sexual Activity  . Alcohol use: No    Alcohol/week: 0.0 oz  . Drug use: No  . Sexual activity: Not on file  Other Topics Concern  . Not on file  Social History Narrative  . Not on file   Immunization History  Administered Date(s) Administered  . Influenza Split 09/10/2015  . Influenza,inj,Quad PF,6+ Mos 09/03/2016, 08/13/2017   Review of Systems  Constitutional: Positive for malaise/fatigue. Negative for chills and  fever.  Eyes: Negative.   Respiratory: Positive for cough, sputum production, shortness of breath and wheezing.   Gastrointestinal: Negative.   Genitourinary: Negative.   Musculoskeletal: Negative.   Neurological: Negative for weakness.  Endo/Heme/Allergies: Negative.   Psychiatric/Behavioral: Negative.          Objective:  BP 127/72 (BP Location: Right Arm, Patient Position: Sitting, Cuff Size: Large)   Pulse 77   Temp 98.1 F (36.7 C) (Oral)   Resp 18   Ht 5\' 8"  (1.727 m)   Wt (!) 325 lb (147.4 kg)   SpO2 96%   BMI 49.42 kg/m   General Appearance:    Alert, cooperative, moderate distress, appears stated age  Head:    Normocephalic, without obvious abnormality, atraumatic  Eyes:    PERRL, conjunctiva/corneas clear, EOM's intact, fundi    benign, both eyes       Ears:    Normal TM's and external ear canals, both ears  Nose:   Nares normal, septum midline, mucosa normal, no drainage   or sinus tenderness  Throat:   Lips, mucosa, and tongue normal; teeth and gums normal  Neck:   Supple, symmetrical, trachea midline, no adenopathy;       thyroid:  No enlargement/tenderness/nodules; no carotid   bruit or JVD  Back:     Symmetric, no curvature, ROM  normal, no CVA tenderness  Lungs:     Wheezing all lung fields and rhonchi. Congestive cough. Respirations labor  Chest wall:    Mild tenderness to chest wall or deformity  Heart:    Regular rate and rhythm, S1 and S2 normal, no murmur, rub   or gallop  Abdomen:     Soft, non-tender, bowel sounds active all four quadrants,    no masses, no organomegaly  Extremities:   Extremities normal, atraumatic, no cyanosis or edema  Pulses:   2+ and symmetric all extremities  Skin:   Skin color, texture, turgor normal, no rashes or lesions  Lymph nodes:   Cervical, supraclavicular, and axillary nodes normal  Neurologic:   CNII-XII intact. Normal strength, sensation and reflexes      throughout   Assessment:  COPD  Upper respiratory  infection    Plan:  1. COPD GOLD III - DG Chest 2 View; Future - CBC with Differential - albuterol (PROVENTIL) (2.5 MG/3ML) 0.083% nebulizer solution 2.5 mg - ipratropium (ATROVENT) nebulizer solution 0.5 mg  2. Shortness of breath - DG Chest 2 View; Future - BASIC METABOLIC PANEL WITH GFR - albuterol (PROVENTIL) (2.5 MG/3ML) 0.083% nebulizer solution 2.5 mg - ipratropium (ATROVENT) nebulizer solution 0.5 mg  3. Generalized anxiety disorder - ALPRAZolam (XANAX) 0.25 MG tablet; Take 1 tablet (0.25 mg total) by mouth 3 (three) times daily as needed for anxiety.  Dispense: 30 tablet; Refill: 0 - albuterol (PROVENTIL) (2.5 MG/3ML) 0.083% nebulizer solution 2.5 mg - ipratropium (ATROVENT) nebulizer solution 0.5 mg  4. Seasonal allergic rhinitis, unspecified trigger - fluticasone (FLONASE) 50 MCG/ACT nasal spray; Place 2 sprays into both nostrils daily as needed for allergies or rhinitis.  Dispense: 16 g; Refill: 5 5. Acute upper respiratory infection - DG Chest 2 View; Future - azithromycin (ZITHROMAX) 250 MG tablet; Take 500 mg on day 1, take 250 mg on days 2-5.  Dispense: 6 tablet; Refill: 0 - dextromethorphan-guaiFENesin (MUCINEX DM) 30-600 MG 12hr tablet; Take 1 tablet by mouth 2 (two) times daily.  Dispense: 30 tablet; Refill: 1   RTC: 3 months for chronic conditions   Nolon NationsLaChina Moore Affan Callow  MSN, FNP-C Patient [facility] [address]  [place], [place] [id number] [phone]

## 2017-11-07 ENCOUNTER — Other Ambulatory Visit: Payer: Self-pay | Admitting: Family Medicine

## 2017-11-11 ENCOUNTER — Ambulatory Visit: Payer: Medicaid Other | Admitting: Internal Medicine

## 2017-11-11 ENCOUNTER — Ambulatory Visit: Payer: Medicaid Other | Admitting: Family Medicine

## 2017-11-25 ENCOUNTER — Other Ambulatory Visit: Payer: Self-pay | Admitting: Family Medicine

## 2017-11-25 ENCOUNTER — Telehealth: Payer: Self-pay | Admitting: Family Medicine

## 2017-11-25 DIAGNOSIS — G629 Polyneuropathy, unspecified: Secondary | ICD-10-CM

## 2017-11-25 DIAGNOSIS — F411 Generalized anxiety disorder: Secondary | ICD-10-CM

## 2017-11-25 MED ORDER — ALPRAZOLAM 0.25 MG PO TABS: 0.2500 mg | ORAL_TABLET | Freq: Three times a day (TID) | ORAL | 0 refills | Status: DC | PRN

## 2017-11-25 NOTE — Progress Notes (Signed)
Reviewed [place] Substance Reporting system prior to prescribing opiate medications. No inconsistencies noted.   Meds ordered this encounter  Medications  . ALPRAZolam (XANAX) 0.25 MG tablet    Sig: Take 1 tablet (0.25 mg total) by mouth 3 (three) times daily as needed for anxiety.    Dispense:  30 tablet    Refill:  0    Order Specific Question:   Supervising Provider    Answer:   [name], [name] [[id number]]    Kenneth NationsLachina Moore Cassie Henkels  MSN, FNP-C Patient Care [facility] [address]  [place], [place] [address] [phone]

## 2017-11-25 NOTE — Telephone Encounter (Signed)
Patient needs a refill on his Xanax. Stated that it would be ok to give rx to spouse when she comes for appt today. Please advise.

## 2017-11-25 NOTE — Telephone Encounter (Signed)
This has been completed.  Thanks. 

## 2017-12-23 ENCOUNTER — Other Ambulatory Visit: Payer: Self-pay | Admitting: Internal Medicine

## 2018-01-13 ENCOUNTER — Ambulatory Visit: Payer: Medicaid Other | Admitting: Internal Medicine

## 2018-01-13 ENCOUNTER — Encounter: Payer: Self-pay | Admitting: Family Medicine

## 2018-01-13 ENCOUNTER — Ambulatory Visit: Payer: Medicaid Other | Admitting: Family Medicine

## 2018-01-13 ENCOUNTER — Encounter: Payer: Self-pay | Admitting: Internal Medicine

## 2018-01-13 VITALS — BP 132/74 | HR 78 | Ht 69.0 in | Wt 328.0 lb

## 2018-01-13 VITALS — BP 135/77 | HR 77 | Temp 98.5°F | Resp 16 | Ht 69.0 in | Wt 329.0 lb

## 2018-01-13 DIAGNOSIS — J9611 Chronic respiratory failure with hypoxia: Secondary | ICD-10-CM | POA: Diagnosis not present

## 2018-01-13 DIAGNOSIS — I10 Essential (primary) hypertension: Secondary | ICD-10-CM

## 2018-01-13 DIAGNOSIS — J449 Chronic obstructive pulmonary disease, unspecified: Secondary | ICD-10-CM | POA: Diagnosis not present

## 2018-01-13 DIAGNOSIS — I519 Heart disease, unspecified: Secondary | ICD-10-CM | POA: Diagnosis not present

## 2018-01-13 DIAGNOSIS — I5189 Other ill-defined heart diseases: Secondary | ICD-10-CM

## 2018-01-13 DIAGNOSIS — F411 Generalized anxiety disorder: Secondary | ICD-10-CM

## 2018-01-13 DIAGNOSIS — J9612 Chronic respiratory failure with hypercapnia: Secondary | ICD-10-CM | POA: Diagnosis not present

## 2018-01-13 DIAGNOSIS — Z9981 Dependence on supplemental oxygen: Secondary | ICD-10-CM

## 2018-01-13 MED ORDER — CARVEDILOL 6.25 MG PO TABS: 6.2500 mg | ORAL_TABLET | Freq: Two times a day (BID) | ORAL | 1 refills | Status: DC

## 2018-01-13 MED ORDER — BUSPIRONE HCL 10 MG PO TABS: 10.0000 mg | ORAL_TABLET | Freq: Two times a day (BID) | ORAL | 5 refills | Status: DC

## 2018-01-13 MED ORDER — ALPRAZOLAM 0.25 MG PO TABS: 0.2500 mg | ORAL_TABLET | Freq: Three times a day (TID) | ORAL | 0 refills | Status: DC | PRN

## 2018-01-13 NOTE — Progress Notes (Addendum)
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 Healthcare associated pneumonia. 2. Acute on chronic respiratory failure with hypoxia. 3. COPD without exacerbation. 4. Chronic diastolic congestive heart failure. Remain compensated. 5. Morbid obesity. 6. Mild normocytic anemia, in part, hemodilutional. 7. Lactic acidosis.  Discharge Condition: Improved.  Diet recommendation: Heart healthy.  Filed Weights   12/13/15 1116 12/13/15 1457  Weight: 141.522 kg (312 lb) 142.883 kg (315 lb)    History of present illness:  The patient is a 62 year old man with history of oxygen dependent COPD. He was recently hospitalized in December for septic shock secondary to community-acquired pneumonia which required intubation. He had been doing fairly well at home until approximately 3 days prior to his presentation to the ED again on 12/12/2014. He complained of shortness of breath, coughing, runny nose, and a headache. In the ED, his chest x-ray revealed right middle lobe atelectasis or infiltrate/airspace disease. His lactic acid was 2.41. He was slightly more hypoxic than usual, oxygenating 88-90% on his usual 3 L of oxygen. He was admitted for further evaluation and management.  Hospital Course:   1. Healthcare associated pneumonia. The patient was recently hospitalized in December after a prolonged stay with ventilator dependent respiratory failure secondary to septic shock, pneumonia, and hypoxic respiratory failure. He returned on 12/13/15 with shortness of breath and cough. Chest x-ray in the ED revealed right middle lobe atelectasis  versus airspace disease. ABG on 2 L of oxygen on admission revealed a pH of 7.4, PCO2 of 46, and PO2 of 67. Cefepime and vancomycin were started. He was continued on oxygen. -Influenza was negative. HIV was nonreactive. Blood cultures were negative to date. Urine Legionella antigen was pending at the time of discharge. Strep will antigen was negative. -Patient improved clinically and symptomatically. His oxygen saturations improved progressively. He did not have significant wheezing, however, at the time of discharge, it was felt that a few days of prednisone would be helpful. Therefore, he was discharged on 3 more days of prednisone along with 5 more days of Ceftin for ongoing treatment. He was also given a prescription for albuterol nebulizer to be used 3 times a day for 5 more days and then every 4-6 hours thereafter. He was encouraged to wear his oxygen more consistently at home.  Acute on chronic respiratory failure with hypoxia. Patient is treated with 2-3 L of nasal cannula oxygen. His oxygen saturations were lower than his normal range on admission. Oxygen was titrated to keep his oxygen saturations 90% or greater. His wife stated that the patient did not wear his oxygen 24/7. Patient was encouraged to wear his oxygen close 24 hours a day 7 days a week as possible. He voiced understanding.  Chronic oxygen dependent COPD. He was continued on Symbicort. There was no significant wheezing on admission, but he did have occasional wheezes prior to discharge, therefore, he was discharged on a very short prednisone taper.   Chronic diastolic heart failure. His heart failure was compensated. He was continued on Lasix.       12/21/2015 1st [facility] office visit/  W[facility]   Chief Complaint  Patient presents with  . HFU    Pt states that his breathing has improved some, but not baseline for him. He c/o chest congestion and has had some cough with clear sputum. He has been wheezing some. He is  using albuterol inhaler 4-5 x per day and neb 2-4 x per day.   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  Plan B= Backup Only use your albuterol as a rescue medication to  Plan C = crisis Only use nebulizer if you try the ventolin first and it doesn't work  Try prilosec otc 20mg   Take 30-60 min before first meal of the day and Pepcid ac (famotidine) 20 mg one @  bedtime until cough is completely gone for at least a week without the need for cough suppression GERD diet        08/13/2017  f/u ov/Wert re: GOLD III / 2lpm hs / with activity / symbicort 80 2bid with poor hfa  Chief Complaint  Patient presents with  . Follow-up    He states noticing some congestion in his chest over the past 2 days. He is not coughing much. He did produce some yellow sputum this am in the shower.   limited by burning pain knee to hip L side only / stays numb all the time  mb and back to house is steep and 50 ft and into house usually s stopping and s 02  saba twice daily if overdoes it  rec Work on inhaler technique:  Change symbicort to 160 Take 2 puffs first thing in am and then another 2 puffs about 12 hours later.  To get the most out of exercise, you need to be continuously aware that you are short of breath, but never out of breath, for 30 minutes daily. Please see patient coordinator before you leave today  to schedule overnight 02 saturations on room air > never done     01/13/2018  f/u ov/Wert re:  Copd III/ 02 2lpm and prn daytime though no desats documented  Chief Complaint  Patient presents with  . Follow-up    Breathing is overall doing well. He uses his albuterol inhaler 1 x daily on average and rarely uses neb.    Dyspnea:  MMRC3 = can't walk  100 yards even at a slow pace at a flat grade s stopping due to sob   Cough: no problem Sleep: on 2lpm most night / 45 degrees x 2 years   Only using saba if over does it  No regular flat walking/ also limited by pinched nerve L lateral thigh  to knee    No obvious day to day or daytime variability or assoc excess/ purulent sputum or mucus plugs or hemoptysis or cp or chest tightness, subjective wheeze or overt sinus or hb symptoms. No unusual exposure hx or h/o childhood pna/ asthma or knowledge of premature birth.  Sleeping ok flat without nocturnal  or early am exacerbation  of respiratory  c/o's or need for noct saba. Also denies any obvious fluctuation of symptoms with weather or environmental changes or other aggravating or alleviating factors except as  outlined above   Current Allergies, Complete Past Medical History, Past Surgical History, Family History, and Social History were reviewed in [facility] electronic [facility] record.  ROS  The following are not active complaints unless bolded Hoarseness, sore throat, dysphagia, dental problems, itching, sneezing,  nasal congestion or discharge of excess mucus or purulent secretions, ear ache,   fever, chills, sweats, unintended wt loss or wt gain, classically pleuritic or exertional cp,  orthopnea pnd or leg swelling, presyncope, palpitations, abdominal pain, anorexia, nausea, vomiting, diarrhea  or change in bowel habits or change in bladder habits, change in stools or change in urine, dysuria, hematuria,  rash, arthralgias, visual complaints, headache, numbness, weakness or ataxia or problems with walking or coordination,  change in mood/affect or memory.        Current Meds  Medication Sig  . acetaminophen (TYLENOL) 500 MG tablet Take 1,000 mg by mouth every 6 (six) hours as needed for moderate pain.  Marland Kitchen. albuterol (PROVENTIL HFA;VENTOLIN HFA) 108 (90 Base) MCG/ACT inhaler Inhale 2 puffs into the lungs every 6 (six) hours as needed for  wheezing or shortness of breath.  Marland Kitchen. albuterol (PROVENTIL) (2.5 MG/3ML) 0.083% nebulizer solution Take 3 mLs (2.5 mg total) by nebulization every 4 (four) hours as needed for wheezing or shortness of breath. DX: J44.9  . budesonide-formoterol (SYMBICORT) 160-4.5 MCG/ACT inhaler INHALE 2 PUFFS BY MOUTH FIRST THING IN THE MORNING, AND THEN ANOTHER 2 PUFFS ABOUT 12 HOURS LATER  . dextromethorphan-guaiFENesin (MUCINEX DM) 30-600 MG 12hr tablet Take 1 tablet by mouth 2 (two) times daily.  . fluticasone (FLONASE) 50 MCG/ACT nasal spray Place 2 sprays into both nostrils daily as needed for allergies or rhinitis.  . furosemide (LASIX) 40 MG tablet Take 1 tablet (40 mg total) by mouth daily. (Patient taking differently: Take 20 mg by mouth daily. )  . gabapentin (NEURONTIN) 400 MG capsule TAKE 1 CAPSULE BY MOUTH TWICE DAILY  . OXYGEN 2 with sleep and exertion  . triamcinolone ointment (KENALOG) 0.5 % APPLY  OINTMENT TOPICALLY TWICE DAILY  . [DISCONTINUED] ALPRAZolam (XANAX) 0.25 MG tablet Take 1 tablet (0.25 mg total) by mouth 3 (three) times daily as needed for anxiety.  . [DISCONTINUED] busPIRone (BUSPAR) 10 MG tablet TAKE 1 TABLET BY MOUTH TWICE DAILY  . [DISCONTINUED] carvedilol (COREG) 6.25 MG tablet Take 1 tablet (6.25 mg total) by mouth 2 (two) times daily with a meal.                   Objective:   Physical Exam   obese wm nad   04/16/2016          309 >  05/28/2016  305 > 09/03/2016   318 >  02/13/2017  323 >  08/13/2017  326 > 01/13/2018  328  01/17/2016          311   12/20/15 308 lb (139.708 kg)  12/13/15 315 lb (142.883 kg)  12/08/15 312 lb (141.522 kg)    Vital signs reviewed - Note on arrival 02 sats  100% on RA          HEENT: nl dentition, turbinates bilaterally, and oropharynx. Nl external ear canals without cough reflex   NECK :  without JVD/Nodes/TM/ nl carotid upstrokes bilaterally   LUNGS: no acc muscle use, distant bs bilaterally, no wheeze    CV:  RRR  no s3 or murmur  or increase in P2, and no edema   ABD:  tensely obese with inspiratory excursion in the  supine position. No bruits or organomegaly appreciated, bowel sounds nl  MS:  Nl gait/ ext warm without deformities, calf tenderness, cyanosis or clubbing No obvious joint restrictions   SKIN: warm and dry without lesions    NEURO:  alert, approp, nl sensorium with  no motor or cerebellar deficits apparent.         Chemistry      Component Value Date/Time   NA 142 01/13/2018 1509   K 4.5 01/13/2018 1509   CL 98 01/13/2018 1509   CO2 30 (H) 01/13/2018 1509   BUN 18 01/13/2018 1509   CREATININE 0.92 01/13/2018 1509   CREATININE 1.08 10/29/2017 1537      Component Value Date/Time   CALCIUM 9.2 01/13/2018 1509                             Assessment:

## 2018-01-13 NOTE — Progress Notes (Signed)
Subjective:    Patient ID: Kenneth Casey, male    DOB: 05-Apr-1956, 62 y.o.   MRN: [id number]  [name], a 62 year old male with a history of diastolic dysfunction, COPD,  and obesity presents accompanied by wife for chronic conditions.   [name] states that symptoms have stabilized on current medication regimen.  He has continued to use home oxygen at 2 liters. He endorses periodic dyspnea with exertion.  [name] continues to follow with Dr. Melvyn Novas, pulmonology. Patient's symptoms are currently controlled on Symbicort twice daily. He maintains that he has not required albuterol inhaler. [name] is a former smoker with a long history of tobacco use. Patient's cardiac risk factors are obesity (BMI >= 30 kg/m2) and sedentary lifestyle.   He reports that he experiences anxiety primarily at night with periodic insomnia.  [name] says that anxiety increases with shortness of breath. He has the following symptoms: feelings of losing control, insomnia, irritable, racing thoughts, shortness of breath. Onset of symptoms was several months ago. Symptoms of anxiety are relieved by Diazepam 0.25 mg.   [name] has a history of morbid obesity. Body mass index is 48.58 kg/m. Patient mostly has a high fat, high cholesterol diet divided over 2-3 large meals. He does not exercise routinely due to history of COPD and chronic oxygen use.    Past Medical History:  Diagnosis Date  . COPD (chronic obstructive pulmonary disease) ([place])   . Obese   . Pneumonia    Social History   Socioeconomic History  . Marital status: Married    Spouse name: Not on file  . Number of children: Not on file  . Years of education: Not on file  . Highest education level: Not on file  Social Needs  . Financial resource strain: Not on file  . Food insecurity - worry: Not on file  . Food insecurity - inability: Not on file  . Transportation needs - medical: Not on file  . Transportation needs - non-medical:  Not on file  Occupational History  . Not on file  Tobacco Use  . Smoking status: Former Smoker    Packs/day: 2.00    Years: 30.00    Pack years: 60.00    Types: Cigarettes    Last attempt to quit: 01/08/2011    Years since quitting: 7.0  . Smokeless tobacco: Never Used  Substance and Sexual Activity  . Alcohol use: No    Alcohol/week: 0.0 oz  . Drug use: No  . Sexual activity: Not on file  Other Topics Concern  . Not on file  Social History Narrative  . Not on file   Immunization History  Administered Date(s) Administered  . Influenza Split 09/10/2015  . Influenza,inj,Quad PF,6+ Mos 09/03/2016, 08/13/2017   Review of Systems  Constitutional: Negative.  Negative for chills.  Respiratory: Positive for apnea and cough.   Cardiovascular: Negative.   Gastrointestinal: Negative.   Endocrine: Negative.   Genitourinary: Negative.   Musculoskeletal: Negative.   Allergic/Immunologic: Positive for environmental allergies. Negative for immunocompromised state.  Neurological: Negative.   Hematological: Negative.   Psychiatric/Behavioral: Negative.        Objective:   Physical Exam  Constitutional: He is oriented to person, place, and time. He appears well-developed and well-nourished.  HENT:  Head: Normocephalic and atraumatic.  Right Ear: External ear normal.  Left Ear: External ear normal.  Nose: Nose normal.  Mouth/Throat: Oropharynx is clear and moist.  Eyes: Conjunctivae are normal. Pupils  are equal, round, and reactive to light.  Neck: Normal range of motion. Neck supple.  Cardiovascular: Normal rate, regular rhythm, normal heart sounds and intact distal pulses.  Pulmonary/Chest: Apnea noted. No tachypnea. No respiratory distress. He has no decreased breath sounds.  Abdominal: Soft. Bowel sounds are normal.  Neurological: He is alert and oriented to person, place, and time. He has normal reflexes.  Skin: Skin is warm and dry.  Psychiatric: His mood appears anxious.       BP 135/77 (BP Location: Left Arm, Patient Position: Sitting, Cuff Size: Large)   Pulse 77   Temp 98.5 F (36.9 C) (Oral)   Resp 16   Ht '5\' 9"'  (1.753 m)   Wt (!) 329 lb (149.2 kg)   SpO2 93%   BMI 48.58 kg/m  Assessment & Plan:    1. Essential hypertension Blood pressure is at goal on current medication regimen, no changes warranted on today.  Will review renal functioning as results become available.  Continue medication, monitor blood pressure at home. Continue DASH diet. Reminder to go to the ER if any CP, SOB, nausea, dizziness, severe HA, changes vision/speech, left arm numbness and tingling and jaw pain.    - Ambulatory referral to Ophthalmology - Basic Metabolic Panel - carvedilol (COREG) 6.25 MG tablet; Take 1 tablet (6.25 mg total) by mouth 2 (two) times daily with a meal.  Dispense: 180 tablet; Refill: 1  2. On home oxygen therapy Continue 2 liters of oxygen per pulmonologist.   3. Severe obesity (BMI >= 40) (HCC) Discussed diet at length. Patient typically does not watch portions and meals mostly consist of starches and meat. Recommended nutritionist, patient says that he has met with a nutritionist before and he knows what to eat, he just has to make an effort. I also recommend that he increase daily activity.   4. Generalized anxiety disorder GAD 7 : Generalized Anxiety Score 01/13/2018 02/13/2017 06/04/2016  Nervous, Anxious, on Edge '1 1 1  ' Control/stop worrying 0 1 0  Worry too much - different things 0 2 1  Trouble relaxing '1 2 1  ' Restless 0 1 0  Easily annoyed or irritable 0 1 2  Afraid - awful might happen 0 1 0  Total GAD 7 Score '2 9 5  ' Anxiety Difficulty Not difficult at all Somewhat difficult Somewhat difficult    - busPIRone (BUSPAR) 10 MG tablet; Take 1 tablet (10 mg total) by mouth 2 (two) times daily.  Dispense: 60 tablet; Refill: 5 - ALPRAZolam (XANAX) 0.25 MG tablet; Take 1 tablet (0.25 mg total) by mouth 3 (three) times daily as needed for  anxiety.  Dispense: 60 tablet; Refill: 0  5. Diastolic dysfunction Continue Lasix 20 mg and Coreg 6.25 mg BID  6. COPD GOLD III Continue to follow up with Dr. Melvyn Novas as scheduled.  Continue home oxygen at 2 L.  Conserve energy, refrain from outdoor activities during extremely hot weather.    RTC: 6 months for chronic conditions    The patient was given clear instructions to go to ER or return to medical center if symptoms do not improve, worsen or new problems develop. The patient verbalized understanding.    Kenneth Pounds  MSN, FNP-C Patient [facility] [address] [place], [place] [address] [phone]

## 2018-01-13 NOTE — Patient Instructions (Addendum)
Please see patient coordinator before you leave today  to schedule Overnight oximetry on RA   No change in medications     To get the most out of exercise, you need to be continuously aware that you are short of breath, but never out of breath, for 30 minutes daily. As you improve, it will actually be easier for you to do the same amount of exercise  in  30 minutes so always push to the level where you are short of breath.       Please schedule a follow up visit in 3 months but call sooner if needed   late add: refer to rehab

## 2018-01-14 ENCOUNTER — Telehealth: Payer: Self-pay

## 2018-01-14 ENCOUNTER — Telehealth: Payer: Self-pay | Admitting: *Deleted

## 2018-01-14 ENCOUNTER — Encounter: Payer: Self-pay | Admitting: Internal Medicine

## 2018-01-14 LAB — BASIC METABOLIC PANEL
BUN / CREAT RATIO: 20 (ref 9–20)
BUN: 18 mg/dL (ref 6–24)
CO2: 30 mmol/L — AB (ref 20–29)
CREATININE: 0.92 mg/dL (ref 0.76–1.27)
Calcium: 9.2 mg/dL (ref 8.7–10.2)
Chloride: 98 mmol/L (ref 96–106)
GFR calc Af Amer: 105 mL/min/{1.73_m2} (ref >59)
GFR calc non Af Amer: 91 mL/min/{1.73_m2} (ref >59)
GLUCOSE: 98 mg/dL (ref 65–99)
Potassium: 4.5 mmol/L (ref 3.5–5.2)
Sodium: 142 mmol/L (ref 134–144)

## 2018-01-14 NOTE — Telephone Encounter (Signed)
-----   Message from Massie MaroonLachina M Hollis, [place]NP sent at 01/14/2018  6:16 AM EST ----- Regarding: lab results Please inform Mr. Kenneth Buffyrater that all labs are within a normal range. No medication changes are warranted at this time. Please follow up in office as scheduled.  Thanks

## 2018-01-14 NOTE — Assessment & Plan Note (Addendum)
On 02 3lpm since d/c 11/18/2015 - HCO3  34  12/14/15  - 01/17/2016 sats mid 90's RA at rest so rec 2lpm sleeping / exerting but none needed at rest  - 04/16/2016  Walked 4lpm pulsed x 3 laps @ 185 ft each stopped due to end of study, mild sob, no desat  - 08/13/2017  Walked RA x 3 laps @ 185 ft each stopped due to  End of study, nl to mod fast pace, no desat , min sob    - 01/13/2018  Walked RA x 3 laps @ 185 ft each stopped due to  End of study, nl pace, no sob or desat   - HCO3  01/13/2018  =  30 c/w only mild hypercarbia   - ONO RA 01/13/2018 requested again  Clearly he does not meet criteria for amb 02 and would do better if he learned to pace himself / excellent candidate for pulmonary rehab > referred   I had an extended discussion with the patient and wife  reviewing all relevant studies completed to date and  lasting 15 to 20 minutes of a 25 minute visit    Each maintenance medication was reviewed in detail including most importantly the difference between maintenance and prns and under what circumstances the prns are to be triggered using an action plan format that is not reflected in the computer generated alphabetically organized AVS.    Please see AVS for specific instructions unique to this visit that I personally wrote and verbalized to the the pt in detail and then reviewed with pt  by my nurse highlighting any  changes in therapy recommended at today's visit to their plan of care.

## 2018-01-14 NOTE — Assessment & Plan Note (Addendum)
12/20/2015  extensive coaching HFA effectiveness =    75% > continue symbicort 160 2bid - Spirometry 01/17/2016  FEV1 1.62 (44%)  Ratio 61 - 05/28/2016   try bevespi 2bid > improved 09/03/2016 but decided liked symbicort better = 80 bid - 08/13/2017  After extensive coaching HFA effectiveness =  75% (short Ti) > try symb 160 2bid   01/13/2018  After extensive coaching inhaler device  effectiveness =    75% (still short Ti)  Despite suboptimal hfa >>> Relatively well compensated on present rx/ rare need for saba and only when "over does it" so needs to learn to pace himself better -  see avs for instructions unique to this ov

## 2018-01-14 NOTE — Telephone Encounter (Signed)
Called, no answer. Left a message to call back. Thanks!  

## 2018-01-14 NOTE — Telephone Encounter (Signed)
Spoke with the pt He states he wants to think about rehab  He lives an hour away and does not want to drive this far several times per wk  He will call if he changes his mind

## 2018-01-14 NOTE — Assessment & Plan Note (Signed)
Body mass index is 48.44 kg/m.  -  trending up Lab Results  Component Value Date   TSH 2.564 12/08/2015     Contributing to gerd risk/ doe/reviewed the need and the process to achieve and maintain neg calorie balance > defer f/u primary care including intermittently monitoring thyroid status

## 2018-01-14 NOTE — Telephone Encounter (Signed)
-----   Message from Nyoka CowdenMichael B Wert, MD sent at 01/14/2018  7:41 AM EST ----- Refer to pulmonary rehab

## 2018-01-16 NOTE — Telephone Encounter (Signed)
Informed patient of all labs being within range and to keep next appointment. Thanks !

## 2018-01-16 NOTE — Patient Instructions (Signed)
Your blood pressure is at goal, no medication changes warranted on today. Continue home oxygen use as prescribed. Body mass index is 48.58 kg/m., which is consistent with morbid obesity. Recommend low fat, low carbohydrate diet divided over 4-5 small meals through out the day.   Calorie Counting for Weight Loss Calories are units of energy. Your body needs a certain amount of calories from food to keep you going throughout the day. When you eat more calories than your body needs, your body stores the extra calories as fat. When you eat fewer calories than your body needs, your body burns fat to get the energy it needs. Calorie counting means keeping track of how many calories you eat and drink each day. Calorie counting can be helpful if you need to lose weight. If you make sure to eat fewer calories than your body needs, you should lose weight. Ask your health care provider what a healthy weight is for you. For calorie counting to work, you will need to eat the right number of calories in a day in order to lose a healthy amount of weight per week. A dietitian can help you determine how many calories you need in a day and will give you suggestions on how to reach your calorie goal.  A healthy amount of weight to lose per week is usually 1-2 lb (0.5-0.9 kg). This usually means that your daily calorie intake should be reduced by 500-750 calories.  Eating 1,200 - 1,500 calories per day can help most women lose weight.  Eating 1,500 - 1,800 calories per day can help most men lose weight.  What is my plan? My goal is to have __________ calories per day. If I have this many calories per day, I should lose around __________ pounds per week. What do I need to know about calorie counting? In order to meet your daily calorie goal, you will need to:  Find out how many calories are in each food you would like to eat. Try to do this before you eat.  Decide how much of the food you plan to eat.  Write down  what you ate and how many calories it had. Doing this is called keeping a food log.  To successfully lose weight, it is important to balance calorie counting with a healthy lifestyle that includes regular activity. Aim for 150 minutes of moderate exercise (such as walking) or 75 minutes of vigorous exercise (such as running) each week. Where do I find calorie information?  The number of calories in a food can be found on a Nutrition Facts label. If a food does not have a Nutrition Facts label, try to look up the calories online or ask your dietitian for help. Remember that calories are listed per serving. If you choose to have more than one serving of a food, you will have to multiply the calories per serving by the amount of servings you plan to eat. For example, the label on a package of bread might say that a serving size is 1 slice and that there are 90 calories in a serving. If you eat 1 slice, you will have eaten 90 calories. If you eat 2 slices, you will have eaten 180 calories. How do I keep a food log? Immediately after each meal, record the following information in your food log:  What you ate. Don't forget to include toppings, sauces, and other extras on the food.  How much you ate. This can be measured in cups,  ounces, or number of items.  How many calories each food and drink had.  The total number of calories in the meal.  Keep your food log near you, such as in a small notebook in your pocket, or use a mobile app or website. Some programs will calculate calories for you and show you how many calories you have left for the day to meet your goal. What are some calorie counting tips?  Use your calories on foods and drinks that will fill you up and not leave you hungry: ? Some examples of foods that fill you up are nuts and nut butters, vegetables, lean proteins, and high-fiber foods like whole grains. High-fiber foods are foods with more than 5 g fiber per serving. ? Drinks such as  sodas, specialty coffee drinks, alcohol, and juices have a lot of calories, yet do not fill you up.  Eat nutritious foods and avoid empty calories. Empty calories are calories you get from foods or beverages that do not have many vitamins or protein, such as candy, sweets, and soda. It is better to have a nutritious high-calorie food (such as an avocado) than a food with few nutrients (such as a bag of chips).  Know how many calories are in the foods you eat most often. This will help you calculate calorie counts faster.  Pay attention to calories in drinks. Low-calorie drinks include water and unsweetened drinks.  Pay attention to nutrition labels for "low fat" or "fat free" foods. These foods sometimes have the same amount of calories or more calories than the full fat versions. They also often have added sugar, starch, or salt, to make up for flavor that was removed with the fat.  Find a way of tracking calories that works for you. Get creative. Try different apps or programs if writing down calories does not work for you. What are some portion control tips?  Know how many calories are in a serving. This will help you know how many servings of a certain food you can have.  Use a measuring cup to measure serving sizes. You could also try weighing out portions on a kitchen scale. With time, you will be able to estimate serving sizes for some foods.  Take some time to put servings of different foods on your favorite plates, bowls, and cups so you know what a serving looks like.  Try not to eat straight from a bag or box. Doing this can lead to overeating. Put the amount you would like to eat in a cup or on a plate to make sure you are eating the right portion.  Use smaller plates, glasses, and bowls to prevent overeating.  Try not to multitask (for example, watch TV or use your computer) while eating. If it is time to eat, sit down at a table and enjoy your food. This will help you to know  when you are full. It will also help you to be aware of what you are eating and how much you are eating. What are tips for following this plan? Reading food labels  Check the calorie count compared to the serving size. The serving size may be smaller than what you are used to eating.  Check the source of the calories. Make sure the food you are eating is high in vitamins and protein and low in saturated and trans fats. Shopping  Read nutrition labels while you shop. This will help you make healthy decisions before you decide to purchase your  food.  Make a grocery list and stick to it. Cooking  Try to cook your favorite foods in a healthier way. For example, try baking instead of frying.  Use low-fat dairy products. Meal planning  Use more fruits and vegetables. Half of your plate should be fruits and vegetables.  Include lean proteins like poultry and fish. How do I count calories when eating out?  Ask for smaller portion sizes.  Consider sharing an entree and sides instead of getting your own entree.  If you get your own entree, eat only half. Ask for a box at the beginning of your meal and put the rest of your entree in it so you are not tempted to eat it.  If calories are listed on the menu, choose the lower calorie options.  Choose dishes that include vegetables, fruits, whole grains, low-fat dairy products, and lean protein.  Choose items that are boiled, broiled, grilled, or steamed. Stay away from items that are buttered, battered, fried, or served with cream sauce. Items labeled "crispy" are usually fried, unless stated otherwise.  Choose water, low-fat milk, unsweetened iced tea, or other drinks without added sugar. If you want an alcoholic beverage, choose a lower calorie option such as a glass of wine or light beer.  Ask for dressings, sauces, and syrups on the side. These are usually high in calories, so you should limit the amount you eat.  If you want a salad,  choose a garden salad and ask for grilled meats. Avoid extra toppings like bacon, cheese, or fried items. Ask for the dressing on the side, or ask for olive oil and vinegar or lemon to use as dressing.  Estimate how many servings of a food you are given. For example, a serving of cooked rice is  cup or about the size of half a baseball. Knowing serving sizes will help you be aware of how much food you are eating at restaurants. The list below tells you how big or small some common portion sizes are based on everyday objects: ? 1 oz-4 stacked dice. ? 3 oz-1 deck of cards. ? 1 tsp-1 die. ? 1 Tbsp- a ping-pong ball. ? 2 Tbsp-1 ping-pong ball. ?  cup- baseball. ? 1 cup-1 baseball. Summary  Calorie counting means keeping track of how many calories you eat and drink each day. If you eat fewer calories than your body needs, you should lose weight.  A healthy amount of weight to lose per week is usually 1-2 lb (0.5-0.9 kg). This usually means reducing your daily calorie intake by 500-750 calories.  The number of calories in a food can be found on a Nutrition Facts label. If a food does not have a Nutrition Facts label, try to look up the calories online or ask your dietitian for help.  Use your calories on foods and drinks that will fill you up, and not on foods and drinks that will leave you hungry.  Use smaller plates, glasses, and bowls to prevent overeating. This information is not intended to replace advice given to you by your health care provider. Make sure you discuss any questions you have with your health care provider. Document Released: 11/26/2005 Document Revised: 10/26/2016 Document Reviewed: 10/26/2016 Elsevier Interactive Patient Education  [employer]2018 [employer].

## 2018-02-10 ENCOUNTER — Telehealth: Payer: Self-pay | Admitting: Internal Medicine

## 2018-02-10 NOTE — Telephone Encounter (Signed)
[facility]    PCCs please advise on status of ONO order.  Thanks!

## 2018-02-10 NOTE — Telephone Encounter (Signed)
Sent message to [facility] to ck on this Tobe SosSally E Ottinger

## 2018-02-11 NOTE — Telephone Encounter (Signed)
Pt is on their list to be called by the RT to do this ono they ran out of machines and are calling him asap Kenneth Casey

## 2018-02-11 NOTE — Telephone Encounter (Signed)
Spoke with pt, [facility] is available. Pt understood.

## 2018-02-12 ENCOUNTER — Other Ambulatory Visit: Payer: Self-pay | Admitting: Family Medicine

## 2018-02-12 DIAGNOSIS — G629 Polyneuropathy, unspecified: Secondary | ICD-10-CM

## 2018-02-24 ENCOUNTER — Telehealth: Payer: Self-pay

## 2018-02-24 ENCOUNTER — Other Ambulatory Visit: Payer: Self-pay | Admitting: Family Medicine

## 2018-02-24 DIAGNOSIS — F411 Generalized anxiety disorder: Secondary | ICD-10-CM

## 2018-02-24 MED ORDER — ALPRAZOLAM 0.25 MG PO TABS: 0.2500 mg | ORAL_TABLET | Freq: Three times a day (TID) | ORAL | 0 refills | Status: DC | PRN

## 2018-02-24 NOTE — Progress Notes (Signed)
Reviewed [place] Substance Reporting system prior to prescribing opiate medications. No inconsistencies noted.  Meds ordered this encounter  Medications  . ALPRAZolam (XANAX) 0.25 MG tablet    Sig: Take 1 tablet (0.25 mg total) by mouth 3 (three) times daily as needed for anxiety.    Dispense:  60 tablet    Refill:  0    Order Specific Question:   Supervising Provider    Answer:   [name], [name] [[id number]]    Nolon NationsLachina Moore Audreanna Torrisi  MSN, FNP-C Patient Care [facility] [address]  [place], [place] [id number] [phone]

## 2018-03-04 ENCOUNTER — Telehealth: Payer: Self-pay | Admitting: Internal Medicine

## 2018-03-04 NOTE — Telephone Encounter (Signed)
Noted  

## 2018-03-04 NOTE — Telephone Encounter (Signed)
[facility] Called Barbara CowerJason to get a copy of patient's ONO, he stated that the results had not uploaded in patient's chart yet. Will have the resp team look into this and fax it over ASAP.   Spoke with patient. He is aware that we are working on getting his results. He verbalized understanding. [facility]

## 2018-03-20 ENCOUNTER — Other Ambulatory Visit: Payer: Self-pay | Admitting: Internal Medicine

## 2018-03-31 ENCOUNTER — Telehealth: Payer: Self-pay | Admitting: Internal Medicine

## 2018-03-31 NOTE — Telephone Encounter (Signed)
Spoke with pt. He is wanting his ONO results from February.  Dr. Sherene SiresWert - please advise. Thanks.

## 2018-03-31 NOTE — Telephone Encounter (Signed)
Noted. Will let Verlon AuLeslie and MW know.

## 2018-03-31 NOTE — Telephone Encounter (Signed)
It looks like [facility] was supposed to do another one as the first was not adequate but I have not seen it so check with [facility] and re-order if the 2nd study wasn't done (look at the last phone note re this issue)

## 2018-03-31 NOTE — Telephone Encounter (Signed)
Spoke with [employer]. He stated that the patient's chart had not been updated since 02/17/18. Advised him that per our charts, he did do a ONO in March but he did not wear the pulse ox long enough to get data. [facility] would reach out to the patient to get him rescheduled.   [facility] will call the patient today to get this done ASAP.   Nothing else needed at time of call.

## 2018-04-02 ENCOUNTER — Encounter: Payer: Self-pay | Admitting: Internal Medicine

## 2018-04-04 ENCOUNTER — Telehealth: Payer: Self-pay | Admitting: Internal Medicine

## 2018-04-04 DIAGNOSIS — J9612 Chronic respiratory failure with hypercapnia: Principal | ICD-10-CM

## 2018-04-04 DIAGNOSIS — J9611 Chronic respiratory failure with hypoxia: Secondary | ICD-10-CM

## 2018-04-04 NOTE — Telephone Encounter (Signed)
Per MW- [facility]  Needs to use 2lpm with sleep and repeat ONO on 2lpm  [facility]  [facility] pt- [facility]

## 2018-04-07 ENCOUNTER — Telehealth: Payer: Self-pay | Admitting: Internal Medicine

## 2018-04-07 ENCOUNTER — Other Ambulatory Visit: Payer: Self-pay | Admitting: Internal Medicine

## 2018-04-07 DIAGNOSIS — J9611 Chronic respiratory failure with hypoxia: Secondary | ICD-10-CM

## 2018-04-07 DIAGNOSIS — J9612 Chronic respiratory failure with hypercapnia: Principal | ICD-10-CM

## 2018-04-07 NOTE — Telephone Encounter (Signed)
ONO faxed to [facility].  Called & left vm to make them aware.  Apparently pt was sent home with O2 from hospital & it was thru [facility] & when he came to see [place] ono order was put in to [facility].  Nothing further needed.

## 2018-04-11 ENCOUNTER — Telehealth: Payer: Self-pay

## 2018-04-14 ENCOUNTER — Encounter: Payer: Self-pay | Admitting: Internal Medicine

## 2018-04-14 ENCOUNTER — Ambulatory Visit: Payer: Medicaid Other | Admitting: Internal Medicine

## 2018-04-14 ENCOUNTER — Other Ambulatory Visit: Payer: Self-pay | Admitting: Family Medicine

## 2018-04-14 VITALS — BP 136/90 | HR 79 | Ht 69.0 in | Wt 312.0 lb

## 2018-04-14 DIAGNOSIS — J449 Chronic obstructive pulmonary disease, unspecified: Secondary | ICD-10-CM | POA: Diagnosis not present

## 2018-04-14 DIAGNOSIS — J9611 Chronic respiratory failure with hypoxia: Secondary | ICD-10-CM

## 2018-04-14 DIAGNOSIS — J9612 Chronic respiratory failure with hypercapnia: Secondary | ICD-10-CM | POA: Diagnosis not present

## 2018-04-14 DIAGNOSIS — F411 Generalized anxiety disorder: Secondary | ICD-10-CM

## 2018-04-14 MED ORDER — BUDESONIDE-FORMOTEROL FUMARATE 160-4.5 MCG/ACT IN AERO: INHALATION_SPRAY | RESPIRATORY_TRACT | 0 refills | Status: DC

## 2018-04-14 MED ORDER — ALPRAZOLAM 0.25 MG PO TABS: 0.2500 mg | ORAL_TABLET | Freq: Three times a day (TID) | ORAL | 0 refills | Status: DC | PRN

## 2018-04-14 NOTE — Patient Instructions (Addendum)
No change in medications  Work on inhaler technique:  relax and gently blow all the way out then take a nice smooth deep breath back in, triggering the inhaler at same time you start breathing in.  Hold for up to 5 seconds if you can. Blow out thru nose. Rinse and gargle with water when done      Congratulations on your wt loss > keep it up    Please schedule a follow up visit in 3 months but call sooner if needed with pfts on return

## 2018-04-14 NOTE — Assessment & Plan Note (Signed)
12/20/2015  extensive coaching HFA effectiveness =    75% > continue symbicort 160 2bid - Spirometry 01/17/2016  FEV1 1.62 (44%)  Ratio 61 - 05/28/2016   try bevespi 2bid > improved 09/03/2016 but decided liked symbicort better = 80 bid> increased to 160 2bid  08/13/17   - 04/14/2018  After extensive coaching inhaler device  effectiveness =   90% p 4 tries    Despite suboptimal hfa he has improved ex tol with use of 02 and beginning to get things turned around to wt loss so no need for change in rx at this point    I had an extended discussion with the patient reviewing all relevant studies completed to date and  lasting 15 to 20 minutes of a 25 minute visit    See device teaching which extended face to face time for this visit   Each maintenance medication was reviewed in detail including most importantly the difference between maintenance and prns and under what circumstances the prns are to be triggered using an action plan format that is not reflected in the computer generated alphabetically organized AVS.    Please see AVS for specific instructions unique to this visit that I personally wrote and verbalized to the the pt in detail and then reviewed with pt  by my nurse highlighting any  changes in therapy recommended at today's visit to their plan of care.

## 2018-04-14 NOTE — Assessment & Plan Note (Addendum)
.   Body mass index is 46.07 kg/m.  -  trending down/ congratulated  Lab Results  Component Value Date   TSH 2.564 12/08/2015       Contributing to gerd risk/ doe/reviewed the need and the process to achieve and maintain neg calorie balance > defer f/u primary care including intermittently monitoring thyroid status

## 2018-04-14 NOTE — Progress Notes (Signed)
Reviewed [place] Substance Reporting system prior to prescribing opiate medications. No inconsistencies noted.   Meds ordered this encounter  Medications  . ALPRAZolam (XANAX) 0.25 MG tablet    Sig: Take 1 tablet (0.25 mg total) by mouth 3 (three) times daily as needed for anxiety.    Dispense:  60 tablet    Refill:  0    Order Specific Question:   Supervising Provider    Answer:   Quentin Angst [[id number]]     Nolon Nations  MSN, FNP-C Patient Care [facility] [address] [place], [place] [address] [phone]

## 2018-04-14 NOTE — Assessment & Plan Note (Signed)
On 02 3lpm since d/c 11/18/2015 - HCO3  34  12/14/15  - 01/17/2016 sats mid 90's RA at rest so rec 2lpm sleeping / exerting but none needed at rest  - 04/16/2016  Walked 4lpm pulsed x 3 laps @ 185 ft each stopped due to end of study, mild sob, no desat  - 08/13/2017  Walked RA x 3 laps @ 185 ft each stopped due to  End of study, nl to mod fast pace, no desat , min sob   - 01/13/2018  Walked RA x 3 laps @ 185 ft each stopped due to  End of study, nl pace, no sob or desat   - HCO3  01/13/2018  =  30 c/w only mild hypercarbia   - ONO RA  04/01/18 desat x 5 h and 20 min so needs 2lpm and repeat ono on 2lpm rec 04/04/2018 > done but not received as of 04/14/2018   - 04/14/2018  Walked RA x 3 laps @ 185 ft each stopped due to  End of study, sats 88% corrected on 2lpm    As of 04/14/2018 rec 2lpm hs and 2lpm walking

## 2018-04-14 NOTE — Progress Notes (Signed)
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 HFU    Pt states that his breathing has improved some, but not baseline for him. He c/o chest congestion and has had some cough with clear sputum. He has been wheezing some. He is using albuterol inhaler 4-5 x per day and neb 2-4 x per day.   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  Plan B= Backup Only use your albuterol as a rescue medication to  Plan C = crisis Only use nebulizer if you try the ventolin first and it doesn't work  Try prilosec otc   Take 30-60 min before first meal of the day and Pepcid ac (famotidine) 20 mg one @  bedtime until cough is completely gone for at least a week without the need for cough suppression GERD diet        08/13/2017  f/u ov/Eilish Mcdaniel re: GOLD III / 2lpm hs / with activity / symbicort 80 2bid with poor hfa  Chief Complaint  Patient presents with  . Follow-up    He states noticing some congestion in his chest over the past 2 days. He is not coughing much. He did produce some yellow sputum this am in the shower.   limited by burning pain knee to hip L side only / stays numb all the time  mb and back to house is steep and 50 ft and into house usually s stopping and s 02  saba twice daily if overdoes it  rec Work on inhaler technique:  Change symbicort  to 160 Take 2 puffs first thing in am and then another 2 puffs about 12 hours later.  To get the most out of exercise, you need to be continuously aware that you are short of breath, but never out of breath, for 30 minutes daily. Please see patient coordinator before you leave today  to schedule overnight 02 saturations on room air > not done     01/13/2018  f/u ov/Galaxy Borden re:  Copd III/ 02 2lpm and prn daytime though no desats documented  Chief Complaint  Patient presents with  . Follow-up    Breathing is overall doing well. He uses his albuterol inhaler 1 x daily on average and rarely uses neb.    Dyspnea:  MMRC3 = can't walk 100 yards even at a slow pace at a flat grade s stopping due to sob   Cough: no problem Sleep: on 2lpm most night / 45 degrees x 2 years   Only using saba if over does it  No regular flat walking/ also limited by pinched nerve L lateral thigh  to knee  rec Please see patient coordinator before you  leave today  to schedule Overnight oximetry on RA  No change in medications  To get the most out of exercise, you need to be continuously aware that you are short of breath, but never out of breath, for 30 minutes daily.  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 Follow-up    Increased SOB since the last visit- relates to the humid weather. He states sometimes hears "gurgling in chest". He is using his albuterol inhaler 2-3 x per day and neb once per wk on average.   Dyspnea:  On 02 5lpm pulsed walks up 20 min  L legs gives out first  Cough: not much Sleep: on 2lpm / 15 degrees  SABA use:  Varies depending on activity   No obvious day to day or daytime variability or assoc excess/ purulent sputum or mucus plugs or hemoptysis or cp or chest tightness, subjective  wheeze or overt sinus or hb symptoms. No unusual exposure hx or h/o childhood pna/ asthma or knowledge of premature birth.  Sleeping  2lpm/ 15 degrees  without nocturnal  or early am exacerbation  of respiratory  c/o's or need for noct saba. Also denies any obvious fluctuation of symptoms with weather or environmental changes or other aggravating or alleviating factors except as outlined above   Current Allergies, Complete Past Medical History, Past Surgical History, Family History, and Social History were reviewed in [employer] record.  ROS  The following are not active complaints unless bolded Hoarseness, sore throat, dysphagia, dental problems, itching, sneezing,  nasal congestion or discharge of excess mucus or purulent secretions, ear ache,   fever, chills, sweats, unintended wt loss or wt gain, classically pleuritic or exertional cp,  orthopnea pnd or arm/hand swelling  or leg swelling, presyncope, palpitations, abdominal pain, anorexia, nausea, vomiting, diarrhea  or change in bowel habits or change in bladder habits, change in stools or change in urine, dysuria, hematuria,  rash, arthralgias, visual complaints, headache, numbness, weakness or ataxia or problems with walking or coordination,  change in mood or  memory.        Current Meds  Medication Sig  . acetaminophen (TYLENOL) 500 MG tablet Take 1,000 mg by mouth every 6 (six) hours as needed for moderate pain.  Marland Kitchen albuterol (PROVENTIL) (2.5 MG/3ML) 0.083% nebulizer solution Take 3 mLs (2.5 mg total) by nebulization every 4 (four) hours as needed for wheezing or shortness of breath. DX: J44.9  . ALPRAZolam (XANAX) 0.25 MG tablet Take 1 tablet (0.25 mg total) by mouth 3 (three) times daily as needed for anxiety.  . budesonide-formoterol (SYMBICORT) 160-4.5 MCG/ACT inhaler INHALE 2 PUFFS BY MOUTH FIRST THING IN THE MORNING, AND THEN ANOTHER 2 PUFFS ABOUT 12 HOURS LATER  . busPIRone (BUSPAR) 10 MG tablet Take 1 tablet  (10 mg total) by mouth 2 (two) times daily.  . carvedilol (COREG) 6.25 MG tablet Take 1 tablet (6.25 mg total) by mouth 2 (two) times daily with a meal.  . dextromethorphan-guaiFENesin (MUCINEX DM) 30-600 MG 12hr tablet Take 1 tablet by mouth 2 (two) times daily.  . fluticasone (FLONASE) 50 MCG/ACT nasal spray Place 2 sprays into both nostrils daily as needed for allergies or rhinitis.  . furosemide (  LASIX) 40 MG tablet Take 1 tablet (40 mg total) by mouth daily. (Patient taking differently: Take 20 mg by mouth daily. )  . gabapentin (NEURONTIN) 400 MG capsule TAKE 1 CAPSULE BY MOUTH TWICE DAILY  . OXYGEN 2 with sleep and exertion  . PROVENTIL HFA 108 (90 Base) MCG/ACT inhaler INHALE 2 PUFFS BY MOUTH EVERY 6 HOURS AS NEEDED FOR WHEEZING AND FOR SHORTNESS OF BREATH  . triamcinolone ointment (KENALOG) 0.5 % APPLY  OINTMENT TOPICALLY TWICE DAILY                        Objective:   Physical Exam  amb obese wm nad   04/16/2016          309 >  05/28/2016  305 > 09/03/2016   318 >  02/13/2017  323 >  08/13/2017  326 > 01/13/2018  328 > 04/14/2018  312  01/17/2016          311   12/20/15 308 lb (139.708 kg)  12/13/15 315 lb (142.883 kg)  12/08/15 312 lb (141.522 kg)       Vital signs reviewed - Note on arrival 02 sats  93% on RA        HEENT: nl dentition / oropharynx. Nl external ear canals without cough reflex - moderate bilateral non-specific turbinate edema  With mucoid dried secretions    NECK :  without JVD/Nodes/TM/ nl carotid upstrokes bilaterally   LUNGS: no acc muscle use,  Mod barrel  contour chest wall with bilateral  Distant bs s audible wheeze and  without cough on insp or exp maneuver and mod   Hyperresonant  to  percussion bilaterally     CV:  RRR  no s3 or murmur or increase in P2, and no edema   ABD:   Obese but soft  and nontender with pos mid insp Hoover's  in the supine position. No bruits or organomegaly appreciated, bowel sounds nl  MS:   Nl gait/  ext warm without  deformities, calf tenderness, cyanosis or clubbing No obvious joint restrictions   SKIN: warm and dry without lesions    NEURO:  alert, approp, nl sensorium with  no motor or cerebellar deficits apparent.         Assessment:

## 2018-04-16 ENCOUNTER — Telehealth: Payer: Self-pay | Admitting: Internal Medicine

## 2018-04-16 DIAGNOSIS — J9611 Chronic respiratory failure with hypoxia: Secondary | ICD-10-CM

## 2018-04-16 DIAGNOSIS — J9612 Chronic respiratory failure with hypercapnia: Principal | ICD-10-CM

## 2018-04-16 NOTE — Telephone Encounter (Signed)
Called and spoke with Alcario Drought from [place] who stated she had received pt's ONO results and it seems pt wore the pulse ox all weekend due to there being around 60 hours on the test.  Per Alcario Drought, pt's sats dropped below 89% for longer than 2 hours on the 2L O2.  Routing this to Dr. Sherene Sires to make him aware.

## 2018-04-17 NOTE — Telephone Encounter (Signed)
Repeat the ono just at hs and continuously wearing 02 at a new setting of 3lpm and then I will call we additional recs

## 2018-04-17 NOTE — Telephone Encounter (Signed)
New order sent to [facility]

## 2018-04-28 ENCOUNTER — Telehealth: Payer: Self-pay | Admitting: Internal Medicine

## 2018-04-28 DIAGNOSIS — J9612 Chronic respiratory failure with hypercapnia: Principal | ICD-10-CM

## 2018-04-28 DIAGNOSIS — J9611 Chronic respiratory failure with hypoxia: Secondary | ICD-10-CM

## 2018-04-28 NOTE — Telephone Encounter (Signed)
ONO on 3lpm done by [facility] on 04/22/18 was reviewed by MW  Per MW- needs 4lpm o2 and repeat ONO on the 4lpm, unless he has already had a formal sleep study done, in which case he needs to have the sleep study done   Spoke with pt and notified of results per Dr. Sherene Sires. Pt verbalized understanding. He states he has not had PSG done and does not want this done, b/c he does not ever want to use a CPAP machine. I advised in this case will order the repeat ONO on 4lpm. He will start using his 4lpm o2 with sleep.

## 2018-05-08 ENCOUNTER — Telehealth: Payer: Self-pay | Admitting: Internal Medicine

## 2018-05-08 NOTE — Telephone Encounter (Signed)
Received ONO on 4lpm (done by [facility] on 05/01/18) Per MW- this is normal, continue on 4lpm o2 with sleep  LMTCB

## 2018-05-08 NOTE — Telephone Encounter (Signed)
Pt's is calling back [phone]

## 2018-05-08 NOTE — Telephone Encounter (Signed)
Called and spoke with pt regarding ONO results.  Pt expressed understanding. Nothing further needed at this time.

## 2018-05-10 DIAGNOSIS — J449 Chronic obstructive pulmonary disease, unspecified: Secondary | ICD-10-CM | POA: Diagnosis not present

## 2018-05-10 DIAGNOSIS — J9611 Chronic respiratory failure with hypoxia: Secondary | ICD-10-CM | POA: Diagnosis not present

## 2018-05-19 ENCOUNTER — Other Ambulatory Visit: Payer: Self-pay | Admitting: Family Medicine

## 2018-05-19 DIAGNOSIS — G629 Polyneuropathy, unspecified: Secondary | ICD-10-CM

## 2018-06-05 ENCOUNTER — Telehealth: Payer: Self-pay | Admitting: Internal Medicine

## 2018-06-09 DIAGNOSIS — J9611 Chronic respiratory failure with hypoxia: Secondary | ICD-10-CM | POA: Diagnosis not present

## 2018-06-09 DIAGNOSIS — J449 Chronic obstructive pulmonary disease, unspecified: Secondary | ICD-10-CM | POA: Diagnosis not present

## 2018-06-09 NOTE — Telephone Encounter (Signed)
Attempted to contact pt. I did not receive an answer. There was no option for me to leave a message due to his voicemail not being set up. Will try back.  

## 2018-06-10 NOTE — Telephone Encounter (Signed)
Attempted to call Burna MortimerWanda with [facility] in Respiratory dept. Please see below message from [facility] for additional details. I did not receive an answer at time of call. I have left a voicemail message for pt to return call. X1

## 2018-06-10 NOTE — Telephone Encounter (Signed)
Burna MortimerWanda returning call. Cb is [phone].

## 2018-06-10 NOTE — Telephone Encounter (Signed)
Called [facility] to speak to someone from the equipment billing dept and spoke with Merry ProudBrandi stating to her pt qualified for O2 during daytime and due to this, pt thought he would not be receiving a bill due to him qualifying in office for O2.  Per Merry ProudBrandi, she had information only showing that pt qualified for O2 at night and did not have any documentation that pt qualified for O2 during daytime.  I read to [facility] the qualifying saturations from 04/14/18 when pt was walked. Stated to her pt was at 92% at rest on room air, sats dropped to 88% with ambulation, and with 2L O2, pt's sats remained stable at 96% on the 2L O2.  Merry ProudBrandi has asked for the documentation to be faxed to the equipment billing department in her attention so that way they can get everything handled for pt.  Spoke with Florentina AddisonKatie and per Florentina AddisonKatie, ask to speak to someone from respiratory dept due to them possibly being able to see different documentation that biling dept cannot see.  Looked at pt's last OV 04/14/18 and an order was [facility] to [facility] with pt's qualifying sats for daytime O2.  Called [facility] to speak to someone from resp department but unable to speak to a respiratory therapist.  Left a detailed message for respiratory dept to return call.

## 2018-06-10 NOTE — Telephone Encounter (Signed)
Returned Wanda's call who stated they never received any information stating pt qualified for daytime O2.  I read all the information to her of pt's qualifying sats and Burna MortimerWanda stated to me again that they had not received anything on the qualifying data from pt.  Due to [facility] not having any documentation on qualifying data from our office on pt to why he does need the O2 with exertion, this is why pt has been receiving bills for the O2.  Spoke with Almyra FreeLibby, [facility] regarding the issue and she printed everything again for Cordelia PenSherry and also called Cordelia PenSherry, [facility] while I was in her office stating that all the documents have been reprinted for her to refax to [facility]. Cordelia PenSherry also stated after she faxed the documents, she would also call them to make them aware everything has been faxed and that confirmation has been received.  Will close encounter.

## 2018-06-10 NOTE — Telephone Encounter (Signed)
Kenneth Casey is calling back [phone]

## 2018-06-16 ENCOUNTER — Other Ambulatory Visit: Payer: Self-pay | Admitting: Family Medicine

## 2018-06-16 DIAGNOSIS — J302 Other seasonal allergic rhinitis: Secondary | ICD-10-CM

## 2018-07-10 DIAGNOSIS — J449 Chronic obstructive pulmonary disease, unspecified: Secondary | ICD-10-CM | POA: Diagnosis not present

## 2018-07-10 DIAGNOSIS — J9611 Chronic respiratory failure with hypoxia: Secondary | ICD-10-CM | POA: Diagnosis not present

## 2018-07-14 ENCOUNTER — Ambulatory Visit (INDEPENDENT_AMBULATORY_CARE_PROVIDER_SITE_OTHER): Payer: Medicaid Other | Admitting: Family Medicine

## 2018-07-14 ENCOUNTER — Ambulatory Visit (INDEPENDENT_AMBULATORY_CARE_PROVIDER_SITE_OTHER): Payer: Medicaid Other | Admitting: Internal Medicine

## 2018-07-14 ENCOUNTER — Encounter: Payer: Self-pay | Admitting: Internal Medicine

## 2018-07-14 VITALS — BP 127/65 | HR 75 | Temp 98.6°F | Resp 16 | Ht 66.0 in | Wt 290.0 lb

## 2018-07-14 VITALS — BP 126/78 | HR 78 | Ht 66.14 in | Wt 290.0 lb

## 2018-07-14 DIAGNOSIS — I10 Essential (primary) hypertension: Secondary | ICD-10-CM | POA: Diagnosis not present

## 2018-07-14 DIAGNOSIS — J449 Chronic obstructive pulmonary disease, unspecified: Secondary | ICD-10-CM

## 2018-07-14 DIAGNOSIS — J9612 Chronic respiratory failure with hypercapnia: Secondary | ICD-10-CM | POA: Diagnosis not present

## 2018-07-14 DIAGNOSIS — F411 Generalized anxiety disorder: Secondary | ICD-10-CM | POA: Diagnosis not present

## 2018-07-14 DIAGNOSIS — I5189 Other ill-defined heart diseases: Secondary | ICD-10-CM | POA: Diagnosis not present

## 2018-07-14 DIAGNOSIS — J9611 Chronic respiratory failure with hypoxia: Secondary | ICD-10-CM

## 2018-07-14 LAB — PULMONARY FUNCTION TEST
DL/VA % pred: 121 %
DL/VA: 5.29 ml/min/mmHg/L
DLCO UNC: 25.59 ml/min/mmHg
DLCO unc % pred: 94 %
FEF 25-75 POST: 1.2 L/s
FEF 25-75 Pre: 0.88 L/sec
FEF2575-%Change-Post: 36 %
FEF2575-%Pred-Post: 45 %
FEF2575-%Pred-Pre: 33 %
FEV1-%CHANGE-POST: 9 %
FEV1-%PRED-PRE: 52 %
FEV1-%Pred-Post: 57 %
FEV1-PRE: 1.64 L
FEV1-Post: 1.8 L
FEV1FVC-%Change-Post: -1 %
FEV1FVC-%PRED-PRE: 80 %
FEV6-%Change-Post: 11 %
FEV6-%PRED-POST: 74 %
FEV6-%PRED-PRE: 66 %
FEV6-POST: 2.95 L
FEV6-PRE: 2.64 L
FEV6FVC-%CHANGE-POST: 0 %
FEV6FVC-%PRED-POST: 103 %
FEV6FVC-%PRED-PRE: 103 %
FVC-%Change-Post: 11 %
FVC-%PRED-PRE: 64 %
FVC-%Pred-Post: 72 %
FVC-PRE: 2.69 L
FVC-Post: 2.99 L
POST FEV6/FVC RATIO: 99 %
PRE FEV6/FVC RATIO: 98 %
Post FEV1/FVC ratio: 60 %
Pre FEV1/FVC ratio: 61 %
RV % PRED: 198 %
RV: 4.01 L
TLC % PRED: 112 %
TLC: 7.02 L

## 2018-07-14 LAB — POCT GLYCOSYLATED HEMOGLOBIN (HGB A1C): Hemoglobin A1C: 5.5 % (ref 4.0–5.6)

## 2018-07-14 MED ORDER — BUDESONIDE-FORMOTEROL FUMARATE 160-4.5 MCG/ACT IN AERO: INHALATION_SPRAY | RESPIRATORY_TRACT | 0 refills | Status: DC

## 2018-07-14 MED ORDER — ALPRAZOLAM 0.25 MG PO TABS: 0.2500 mg | ORAL_TABLET | Freq: Three times a day (TID) | ORAL | 0 refills | Status: DC | PRN

## 2018-07-14 NOTE — Patient Instructions (Signed)

## 2018-07-14 NOTE — Patient Instructions (Addendum)
Plan A = Automatic = Symbicort 160 Take 2 puffs first thing in am and then another 2 puffs about 12 hours later.   Work on Chemical engineerperfecting inhaler technique:  relax and gently blow all the way out then take a nice smooth deep breath back in, triggering the inhaler at same time you start breathing in.  Hold for up to 5 seconds if you can. Blow out thru nose. Rinse and gargle with water when done    Plan B = Backup Only use your albuterol as a rescue medication to be used if you can't catch your breath by resting or doing a relaxed purse lip breathing pattern.  - The less you use it, the better it will work when you need it. - Ok to use the inhaler up to 2 puffs  every 4 hours if you must but call for appointment if use goes up over your usual need - Don't leave home without it !!  (think of it like the spare tire for your car)   Plan C = Crisis - only use your albuterol nebulizer if you first try Plan B and it fails to help > ok to use the nebulizer up to every 4 hours but if start needing it regularly call for immediate appointment  Daytime goal is to keep 02 sats above 90% at all times > adjust your 02 to get to that level   Congratulations on weight loss   Please schedule a follow up visit in 3 months but call sooner if needed

## 2018-07-14 NOTE — Assessment & Plan Note (Signed)
On 02 3lpm since d/c 11/18/2015 - HCO3  34  12/14/15  - 01/17/2016 sats mid 90's RA at rest so rec 2lpm sleeping / exerting but none needed at rest  - 04/16/2016  Walked 4lpm pulsed x 3 laps @ 185 ft each stopped due to end of study, mild sob, no desat  - 08/13/2017  Walked RA x 3 laps @ 185 ft each stopped due to  End of study, nl to mod fast pace, no desat , min sob   - 01/13/2018  Walked RA x 3 laps @ 185 ft each stopped due to  End of study, nl pace, no sob or desat   - HCO3  01/13/2018  =  30 c/w only mild hypercarbia   - ONO RA  04/01/18 desat x 5 h and 20 min so needs 2lpm   - 04/14/2018  Walked RA x 3 laps @ 185 ft each stopped due to  End of study, sats 88% corrected on 2lpm  - 04/22/18  ono on 3lpm = 1h 44 min  So rec 4lpm and repeat on 4lpm > done 05/01/18  only 12 min sat @ < 89% so no change rx    Reviewed goals of 02 rx = keep sats > 90% esp while sleeping and exercising

## 2018-07-14 NOTE — Assessment & Plan Note (Signed)
12/20/2015  extensive coaching HFA effectiveness =    75% > continue symbicort 160 2bid - Spirometry 01/17/2016  FEV1 1.62 (44%)  Ratio 61 - 05/28/2016   try bevespi 2bid > improved 09/03/2016 but decided liked symbicort better = 80 bid> increased to 160 2bid  08/13/17  - 04/14/2018  After extensive coaching inhaler device  effectiveness =   90% p 4 tries   - 07/14/2018  After extensive coaching inhaler device  effectiveness =    90%  - PFT's  07/14/2018  FEV1 1.80 (57 % ) ratio 60  p 9 % improvement from saba p nothing prior to study with DLCO  94 % corrects to 121 % for alv volume   And erv 16%   Finally turning the corner with wt loss and consistent adequate administration of symb 160 2bid so no change in rx needed - key it to continue wt loss   No change rx needed   I had an extended discussion with the patient reviewing all relevant studies completed to date and  lasting 15 to 20 minutes of a 25 minute visit    See device teaching which extended face to face time for this visit.  Each maintenance medication was reviewed in detail including emphasizing most importantly the difference between maintenance and prns and under what circumstances the prns are to be triggered using an action plan format that is not reflected in the computer generated alphabetically organized AVS which I have not found useful in most complex patients, especially with respiratory illnesses  Please see AVS for specific instructions unique to this visit that I personally wrote and verbalized to the the pt in detail and then reviewed with pt  by my nurse highlighting any  changes in therapy recommended at today's visit to their plan of care.

## 2018-07-14 NOTE — Progress Notes (Signed)
Subjective:    Patient ID: Kenneth Casey, male    DOB: 1956-03-08, 62 y.o.   MRN: [id number]  [name], a 62 year old male with a history of diastolic dysfunction, COPD,  and obesity presents accompanied by wife for chronic conditions.   Kenneth Casey states that symptoms have stabilized on current medication regimen.  He has continued to use home oxygen at 2 liters.4 Liters at night.  He endorses periodic dyspnea with exertion.  Kenneth Casey continues to follow with Dr. Melvyn Novas, pulmonology. Patient's symptoms are currently controlled on Symbicort twice daily. He maintains that he has not required albuterol inhaler. Kenneth Casey is a former smoker with a long history of tobacco use. Patient's cardiac risk factors are obesity (BMI >= 30 kg/m2) and sedentary lifestyle.   He reports that he experiences anxiety primarily at night with periodic insomnia. Patient states that he take xanax 0.25 mg QHS PRN. States that he only takes them at night. Last fill was 04/14/2018.   Kenneth Casey has a history of morbid obesity. Body mass index is 46.81 kg/m. Patient states that he is doing a modified keto and is walking for exercise. Pateint state that he has lost 40 pounds. Patient states that he feels better and can breath better.   Past Medical History:  Diagnosis Date  . COPD (chronic obstructive pulmonary disease) ([place])   . Obese   . Pneumonia    Social History   Socioeconomic History  . Marital status: Married    Spouse name: Not on file  . Number of children: Not on file  . Years of education: Not on file  . Highest education level: Not on file  Occupational History  . Not on file  Social Needs  . Financial resource strain: Not on file  . Food insecurity:    Worry: Not on file    Inability: Not on file  . Transportation needs:    Medical: Not on file    Non-medical: Not on file  Tobacco Use  . Smoking status: Former Smoker    Packs/day: 2.00    Years: 30.00    Pack years: 60.00     Types: Cigarettes    Last attempt to quit: 01/08/2011    Years since quitting: 7.5  . Smokeless tobacco: Never Used  Substance and Sexual Activity  . Alcohol use: No    Alcohol/week: 0.0 oz  . Drug use: No  . Sexual activity: Not on file  Lifestyle  . Physical activity:    Days per week: Not on file    Minutes per session: Not on file  . Stress: Not on file  Relationships  . Social connections:    Talks on phone: Not on file    Gets together: Not on file    Attends religious service: Not on file    Active member of club or organization: Not on file    Attends meetings of clubs or organizations: Not on file    Relationship status: Not on file  . Intimate partner violence:    Fear of current or ex partner: Not on file    Emotionally abused: Not on file    Physically abused: Not on file    Forced sexual activity: Not on file  Other Topics Concern  . Not on file  Social History Narrative  . Not on file   Immunization History  Administered Date(s) Administered  . Influenza Split 09/10/2015  . Influenza,inj,Quad PF,6+ Mos 09/03/2016, 08/13/2017  Review of Systems  Constitutional: Negative.  Negative for chills.  Respiratory: Positive for apnea and cough.   Cardiovascular: Negative.   Gastrointestinal: Negative.   Endocrine: Negative.   Genitourinary: Negative.   Musculoskeletal: Negative.   Allergic/Immunologic: Positive for environmental allergies. Negative for immunocompromised state.  Neurological: Negative.   Hematological: Negative.   Psychiatric/Behavioral: Negative.        Objective:   Physical Exam  Constitutional: He is oriented to person, place, and time. He appears well-developed and well-nourished.  HENT:  Head: Normocephalic and atraumatic.  Right Ear: External ear normal.  Left Ear: External ear normal.  Nose: Nose normal.  Mouth/Throat: Oropharynx is clear and moist.  Eyes: Pupils are equal, round, and reactive to light. Conjunctivae are normal.   Neck: Normal range of motion. Neck supple.  Cardiovascular: Normal rate, regular rhythm, normal heart sounds and intact distal pulses.  Pulmonary/Chest: Apnea noted. No tachypnea. No respiratory distress. He has no decreased breath sounds.  Abdominal: Soft. Bowel sounds are normal.  Neurological: He is alert and oriented to person, place, and time. He has normal reflexes.  Skin: Skin is warm and dry.  Psychiatric: His mood appears anxious.      BP 127/65 (BP Location: Left Arm, Patient Position: Sitting, Cuff Size: Large)   Pulse 75   Temp 98.6 F (37 C) (Oral)   Resp 16   Ht '5\' 6"'  (1.676 m)   Wt 290 lb (131.5 kg)   SpO2 97%   BMI 46.81 kg/m  Assessment & Plan:    1. Essential hypertension Blood pressure is at goal on current medication regimen, no changes warranted on today.  Will review renal functioning as results become available.  Continue medication, monitor blood pressure at home. Continue DASH diet. Reminder to go to the ER if any CP, SOB, nausea, dizziness, severe HA, changes vision/speech, left arm numbness and tingling and jaw pain.    2. On home oxygen therapy Continue 2 liters of oxygen per pulmonologist.   3. Severe obesity (BMI >= 40) (HCC) Discussed diet at length. Patient typically does not watch portions and meals mostly consist of starches and meat. Recommended nutritionist, patient says that he has met with a nutritionist before and he knows what to eat, he just has to make an effort. I also recommend that he increase daily activity.   4. Generalized anxiety disorder  - ALPRAZolam (XANAX) 0.25 MG tablet; Take 1 tablet (0.25 mg total) by mouth 3 (three) times daily as needed for anxiety.  Dispense: 60 tablet; Refill: 0  5. Diastolic dysfunction Continue Lasix 20 mg and Coreg 6.25 mg BID  6. COPD GOLD III Continue to follow up with Dr. Melvyn Novas as scheduled.  Continue home oxygen at 2 L.  Conserve energy, refrain from outdoor activities during extremely  hot weather.    RTC: 6 months for chronic conditions    The patient was given clear instructions to go to ER or return to medical center if symptoms do not improve, worsen or new problems develop. The patient verbalized understanding.    Ms. Andr L. Nathaneil Canary, FNP-BC [facility] [address] [place], [place] [address] [phone]

## 2018-07-14 NOTE — Progress Notes (Signed)
PFT completed today. 07/14/18  

## 2018-07-14 NOTE — Assessment & Plan Note (Addendum)
PFTs 07/14/2018  erv =  16% c/w effects of obesity on lung vol   Body mass index is 46.61 kg/m.  -  trending down nicely  Lab Results  Component Value Date   TSH 2.564 12/08/2015     Contributing to gerd risk/ doe/reviewed the need and the process to achieve and maintain neg calorie balance > defer f/u primary care including intermittently monitoring thyroid status

## 2018-07-14 NOTE — Progress Notes (Signed)
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 HFU    Pt states that his breathing has improved some, but not baseline for him. He c/o chest congestion and has had some cough with clear sputum. He has been wheezing some. He is using albuterol inhaler 4-5 x per day and neb 2-4 x per day.   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 Follow-up    Increased SOB since the last visit- relates to the humid weather. He states sometimes hears "gurgling in chest". He is using his albuterol inhaler 2-3 x per day and neb once per wk on average.   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 Follow-up    PFT today, SOB, productive cough, headache, dental issues, O2 with activity and sleep   Dyspnea:  20 min on 5lpm walking at park some hills Cough: some am / dark, thick  Sleeping: ok on 4lpm rarely wakes up with HA  SABA use: 4-5 x per week and neb if out in heat  02: 4lpm hs and 5lpm ex     No obvious day to day or daytime variability or assoc excess/ purulent sputum or mucus plugs or hemoptysis or cp or chest tightness, subjective wheeze or overt sinus or hb symptoms.   Sleeping on 2 pillows/ 4lpm   without nocturnal or early am exacerbation  of respiratory  c/o's or need for noct saba. Also denies any obvious fluctuation of symptoms with weather or environmental changes or other aggravating or alleviating factors except as outlined above   No unusual exposure hx or  h/o childhood pna/ asthma or knowledge of premature birth.  Current Allergies, Complete Past Medical History, Past Surgical History, Family History, and Social History were reviewed in [employer] record.  ROS  The following are not active complaints unless bolded Hoarseness, sore throat, dysphagia, dental problems, itching, sneezing,  nasal congestion or discharge of excess mucus or purulent secretions, ear ache,   fever, chills, sweats, unintended wt loss or wt gain, classically pleuritic or exertional cp,  orthopnea pnd or arm/hand swelling  or leg swelling, presyncope, palpitations, abdominal pain, anorexia, nausea, vomiting, diarrhea  or change in bowel habits or change in bladder habits, change in stools or change in urine, dysuria, hematuria,  rash, arthralgias, visual complaints, headache, numbness, weakness or ataxia or problems with walking or coordination,  change in mood or  memory.         Current Meds  Medication Sig  . acetaminophen (TYLENOL) 500 MG tablet Take 1,000 mg by mouth every 6 (six) hours as needed for moderate pain.  Marland Kitchen albuterol (PROVENTIL) (2.5 MG/3ML) 0.083% nebulizer solution Take 3 mLs (2.5 mg total) by nebulization every 4 (four) hours as needed for wheezing or shortness of breath. DX: J44.9  . ALPRAZolam (XANAX) 0.25 MG tablet Take 1 tablet (0.25 mg total) by mouth 3 (three) times daily as needed for anxiety.  . budesonide-formoterol (SYMBICORT) 160-4.5 MCG/ACT inhaler INHALE 2 PUFFS BY MOUTH FIRST THING IN THE MORNING, AND THEN ANOTHER 2 PUFFS ABOUT 12 HOURS LATER  . busPIRone (BUSPAR) 10 MG tablet Take 1 tablet (10 mg total) by mouth 2 (two) times daily.  . carvedilol (COREG) 6.25 MG tablet Take 1 tablet (6.25 mg total) by mouth 2 (two) times daily with a meal.  . dextromethorphan-guaiFENesin (MUCINEX DM) 30-600 MG 12hr tablet Take 1 tablet by mouth 2 (two) times daily.  . fluticasone (FLONASE) 50 MCG/ACT nasal spray USE 2 SPRAY(S) IN EACH NOSTRIL ONCE DAILY AS NEEDED FOR  ALLERGIES  OR  RHINITIS  . furosemide (LASIX) 40 MG tablet Take 1 tablet (40 mg total) by mouth daily. (Patient taking differently: Take 20 mg by mouth daily. )  . gabapentin (NEURONTIN) 400 MG capsule TAKE 1 CAPSULE BY MOUTH TWICE DAILY  . OXYGEN 2 with sleep and exertion  . PROVENTIL HFA 108 (90 Base) MCG/ACT inhaler INHALE 2 PUFFS BY MOUTH EVERY 6 HOURS AS NEEDED FOR WHEEZING AND FOR SHORTNESS OF BREATH  . triamcinolone ointment (KENALOG) 0.5 % APPLY  OINTMENT TOPICALLY TWICE DAILY  .                            Objective:   Physical Exam  amb obese wm nad   04/16/2016          309 >  05/28/2016  305 > 09/03/2016   318 >  02/13/2017  323 >  08/13/2017  326 > 01/13/2018  328 > 04/14/2018  312 > 07/14/2018    290  01/17/2016          311   12/20/15 308 lb (139.708 kg)  12/13/15 315 lb (142.883 kg)  12/08/15 312 lb (141.522 kg)      Vital signs reviewed - Note on arrival 02 sats  97%  on RA      HEENT: nl dentition, turbinates bilaterally, and oropharynx. Nl external ear canals without cough reflex   NECK :  without JVD/Nodes/TM/ nl carotid upstrokes bilaterally   LUNGS: no acc muscle use,  Nl contour chest with distant bs  Bilaterally s wheeze and  without cough on insp or exp maneuvers   CV:  RRR  no s3 or murmur or increase in P2, and no edema   ABD:  Quite obese but soft and nontender with limited inspiratory excursion in the supine position. No bruits or organomegaly appreciated, bowel sounds nl  MS:  Nl gait/ ext warm without deformities, calf tenderness, cyanosis or clubbing No obvious joint restrictions   SKIN: warm and dry without lesions    NEURO:  alert, approp, nl sensorium with  no motor or cerebellar deficits apparent.    Assessment:

## 2018-07-14 NOTE — Progress Notes (Signed)
Please let patient know that his A1c decreased. Continue with exercise and weight loss!

## 2018-07-15 LAB — COMPREHENSIVE METABOLIC PANEL
ALT: 14 IU/L (ref 0–44)
AST: 15 IU/L (ref 0–40)
Albumin/Globulin Ratio: 1.5 (ref 1.2–2.2)
Albumin: 4.3 g/dL (ref 3.5–5.5)
Alkaline Phosphatase: 78 IU/L (ref 39–117)
BUN/Creatinine Ratio: 21 — ABNORMAL HIGH (ref 9–20)
BUN: 18 mg/dL (ref 6–24)
Bilirubin Total: 0.4 mg/dL (ref 0.0–1.2)
CO2: 27 mmol/L (ref 20–29)
Calcium: 9.7 mg/dL (ref 8.7–10.2)
Chloride: 97 mmol/L (ref 96–106)
Creatinine, Ser: 0.85 mg/dL (ref 0.76–1.27)
GFR calc Af Amer: 110 mL/min/{1.73_m2} (ref >59)
GFR calc non Af Amer: 95 mL/min/{1.73_m2} (ref >59)
Globulin, Total: 2.9 g/dL (ref 1.5–4.5)
Glucose: 87 mg/dL (ref 65–99)
Potassium: 4.5 mmol/L (ref 3.5–5.2)
Sodium: 138 mmol/L (ref 134–144)
Total Protein: 7.2 g/dL (ref 6.0–8.5)

## 2018-07-16 ENCOUNTER — Other Ambulatory Visit: Payer: Self-pay | Admitting: Family Medicine

## 2018-07-16 ENCOUNTER — Encounter: Payer: Self-pay | Admitting: Family Medicine

## 2018-07-16 DIAGNOSIS — F411 Generalized anxiety disorder: Secondary | ICD-10-CM

## 2018-08-10 DIAGNOSIS — J9611 Chronic respiratory failure with hypoxia: Secondary | ICD-10-CM | POA: Diagnosis not present

## 2018-08-10 DIAGNOSIS — J449 Chronic obstructive pulmonary disease, unspecified: Secondary | ICD-10-CM | POA: Diagnosis not present

## 2018-08-14 ENCOUNTER — Other Ambulatory Visit: Payer: Self-pay | Admitting: Family Medicine

## 2018-08-14 DIAGNOSIS — G629 Polyneuropathy, unspecified: Secondary | ICD-10-CM

## 2018-08-18 ENCOUNTER — Other Ambulatory Visit: Payer: Self-pay | Admitting: Family Medicine

## 2018-08-18 DIAGNOSIS — F411 Generalized anxiety disorder: Secondary | ICD-10-CM

## 2018-08-18 MED ORDER — ALPRAZOLAM 0.25 MG PO TABS: 0.2500 mg | ORAL_TABLET | Freq: Three times a day (TID) | ORAL | 2 refills | Status: DC | PRN

## 2018-08-18 NOTE — Progress Notes (Signed)
refilled 

## 2018-08-19 ENCOUNTER — Telehealth: Payer: Self-pay

## 2018-08-19 NOTE — Telephone Encounter (Signed)
Patient has called to ask about alprazolam refill.  Debby Bud, Please advise if this can be filled for him. This is something that [place] prescribed for patient. This is the fax that I handed you yesterday (08/18/2018). Please advise. Thanks!

## 2018-08-20 NOTE — Telephone Encounter (Signed)
This was sent Monday.

## 2018-08-28 ENCOUNTER — Ambulatory Visit (INDEPENDENT_AMBULATORY_CARE_PROVIDER_SITE_OTHER): Payer: Medicaid Other

## 2018-08-28 DIAGNOSIS — Z23 Encounter for immunization: Secondary | ICD-10-CM | POA: Diagnosis not present

## 2018-09-09 DIAGNOSIS — J449 Chronic obstructive pulmonary disease, unspecified: Secondary | ICD-10-CM | POA: Diagnosis not present

## 2018-09-09 DIAGNOSIS — J9611 Chronic respiratory failure with hypoxia: Secondary | ICD-10-CM | POA: Diagnosis not present

## 2018-09-15 ENCOUNTER — Ambulatory Visit (INDEPENDENT_AMBULATORY_CARE_PROVIDER_SITE_OTHER): Payer: Medicaid Other | Admitting: Family Medicine

## 2018-09-15 ENCOUNTER — Ambulatory Visit (HOSPITAL_COMMUNITY)
Admission: RE | Admit: 2018-09-15 | Discharge: 2018-09-15 | Disposition: A | Discharge status: Home / Self Care | Payer: Medicaid Other | Source: Ambulatory Visit | Attending: Family Medicine | Admitting: Family Medicine

## 2018-09-15 ENCOUNTER — Telehealth: Payer: Self-pay

## 2018-09-15 ENCOUNTER — Encounter: Payer: Self-pay | Admitting: Family Medicine

## 2018-09-15 VITALS — BP 130/65 | HR 69 | Temp 97.9°F | Resp 16 | Ht 66.0 in | Wt 274.0 lb

## 2018-09-15 DIAGNOSIS — N451 Epididymitis: Secondary | ICD-10-CM

## 2018-09-15 DIAGNOSIS — N503 Cyst of epididymis: Secondary | ICD-10-CM | POA: Insufficient documentation

## 2018-09-15 DIAGNOSIS — N50811 Right testicular pain: Secondary | ICD-10-CM

## 2018-09-15 LAB — POCT URINALYSIS DIPSTICK
Bilirubin, UA: NEGATIVE
Blood, UA: NEGATIVE
Glucose, UA: NEGATIVE
Ketones, UA: 15
Leukocytes, UA: NEGATIVE
Nitrite, UA: NEGATIVE
Protein, UA: NEGATIVE
Spec Grav, UA: 1.02 (ref 1.010–1.025)
Urobilinogen, UA: 0.2 E.U./dL
pH, UA: 7 (ref 5.0–8.0)

## 2018-09-15 MED ORDER — SULFAMETHOXAZOLE-TRIMETHOPRIM 800-160 MG PO TABS: 1.0000 | ORAL_TABLET | Freq: Two times a day (BID) | ORAL | 0 refills | Status: AC

## 2018-09-15 MED ORDER — NAPROXEN 500 MG PO TABS: 500.0000 mg | ORAL_TABLET | Freq: Two times a day (BID) | ORAL | 0 refills | Status: DC

## 2018-09-15 NOTE — Telephone Encounter (Signed)
Patient has been advised please see previous message from today. Thanks!

## 2018-09-15 NOTE — Telephone Encounter (Signed)
-----   Message from Mike Gip, FNP sent at 09/15/2018  4:16 PM EDT ----- There is a small 5 mm lower lip cyst noted to the right epididymis.  No testicular torsion.  Will refer to urology for further evaluation continue with the antibiotics and NSAIDs.

## 2018-09-15 NOTE — Progress Notes (Signed)
There is a small 5 mm lower lip cyst noted to the right epididymis.  No testicular torsion.  Will refer to urology for further evaluation continue with the antibiotics and NSAIDs.

## 2018-09-15 NOTE — Patient Instructions (Signed)
Naproxen (NSAID) to help with pain and swelling. I also sent Bactrim. This is an antibiotic. I am referring you for an ultrasound of the area. If you develop fever, chills or abdominal pain, go immediately to the ER.     Scrotal Swelling Scrotal swelling may occur on one or both sides of the scrotum. Pain may also occur with swelling. Possible causes of scrotal swelling include:  Injury.  Infection.  An ingrown hair or abrasion in the area.  Repeated rubbing from tight-fitting underwear.  Poor hygiene.  A weakened area in the muscles around the groin (hernia). A hernia can allow abdominal contents to push into the scrotum.  Fluid around the testicle (hydrocele).  Enlarged vein around the testicle (varicocele).  Certain medical treatments or existing conditions.  A recent genital surgery or procedure.  The spermatic cord becomes twisted in the scrotum, which cuts off blood supply (testicular torsion).  Testicular cancer.  Follow these instructions at home: Once the cause of your scrotal swelling has been determined, you may be asked to monitor your scrotum for any changes. The following actions may help to alleviate any discomfort you are experiencing:  Rest and limit activity until the swelling goes away. Lying down is the preferred position.  Put ice on the scrotum: ? Put ice in a plastic bag. ? Place a towel between your skin and the bag. ? Leave the ice on for 20 minutes, 2-3 times a day for 1-2 days.  Place a rolled towel under the testicles for support.  Wear loose-fitting clothing or an athletic support cup for comfort.  Take all medicines as directed by your health care provider.  Perform a monthly self-exam of the scrotum and penis. Feel for changes. Ask your health care provider how to perform a monthly self-exam if you are unsure.  Contact a health care provider if:  You have a sudden (acute) onset of pain that is persistent and not improving.  You  notice a heavy feeling or fluid in the scrotum.  You have pain or burning while urinating.  You have blood in the urine or semen.  You feel a lump around the testicle.  You notice that one testicle is larger than the other (slight variation is normal).  You have a persistent dull ache or pain in the groin or scrotum. Get help right away if:  The pain does not go away or becomes severe.  You have a fever or shaking chills.  You have pain or vomiting that cannot be controlled.  You notice significant redness or swelling of one or both sides of the scrotum.  You experience redness spreading upward from your scrotum to your abdomen or downward from your scrotum to your thighs. This information is not intended to replace advice given to you by your health care provider. Make sure you discuss any questions you have with your health care provider. Document Released: 12/29/2010 Document Revised: 06/15/2016 Document Reviewed: 04/30/2013 Elsevier Interactive Patient Education  [employer].

## 2018-09-15 NOTE — Progress Notes (Signed)
  [facility]   Progress Note: General Provider: Mike Gip, FNP  SUBJECTIVE:   Kenneth Casey is a 62 y.o. male who  has a past medical history of COPD (chronic obstructive pulmonary disease) (HCC), Obese, and Pneumonia.. Patient presents today for Testicle Pain (PAIN AND SWELLING TO RIGHT TESTICALE )  Patient states that he noticed a large difference in his right testicle.  He states that the testicle is sore to touch and has been swollen for the past 2 days.  He has not taken anything.  He has not had any injury to the area.  He reports a past medical surgery of the inguinal canal with hernia repair at the age of 77.  He denies lifting any heavy objects.  He denies fevers chills night sweats or difficulty with urinating.  He denies burning with urination penile discharge and lower abdominal pain. Review of Systems  Constitutional: Negative.   HENT: Negative.   Eyes: Negative.   Respiratory: Negative.   Cardiovascular: Negative.   Gastrointestinal: Negative.   Genitourinary: Negative.        Right testicular pain and swelling.   Musculoskeletal: Negative.   Skin: Negative.   Neurological: Negative.   Psychiatric/Behavioral: Negative.      OBJECTIVE: BP 130/65 (BP Location: Left Arm, Patient Position: Sitting, Cuff Size: Large)   Pulse 69   Temp 97.9 F (36.6 C) (Oral)   Resp 16   Ht 5\' 6"  (1.676 m)   Wt 274 lb (124.3 kg)   SpO2 98%   PF (!) 2 L/min   BMI 44.22 kg/m   Physical Exam  Constitutional: He appears well-developed and well-nourished. No distress.  HENT:  Head: Normocephalic and atraumatic.  Abdominal: Hernia confirmed negative in the right inguinal area.  Genitourinary: Cremasteric reflex is present. Right testis shows swelling and tenderness. Cremasteric reflex is not absent on the right side. Uncircumcised.     Nursing note and vitals reviewed.   ASSESSMENT/PLAN:   1. Epididymitis - naproxen (NAPROSYN)  500 MG tablet; Take 1 tablet (500 mg total) by mouth 2 (two) times daily with a meal.  Dispense: 30 tablet; Refill: 0 - sulfamethoxazole-trimethoprim (BACTRIM DS,SEPTRA DS) 800-160 MG tablet; Take 1 tablet by mouth 2 (two) times daily for 10 days.  Dispense: 20 tablet; Refill: 0  2. Testicular pain, right Need to r/o testicular torsion.  - US Scrotum; Future - US SCROTUM W/DOPPLER; Future - Urinalysis Dipstick U/s scrotum IMPRESSION: 1. No intratesticular abnormality is seen. Blood flow is demonstrated to both testicles. 2. Small right epididymal cyst of 5 mm in diameter. 3. Tiny amount of fluid bilaterally.    Naproxen (NSAID) to help with pain and swelling. I also sent Bactrim. This is an antibiotic. I am referring you for an ultrasound of the area. If you develop fever, chills or abdominal pain, go immediately to the ER.    The patient was given clear instructions to go to ER or return to medical center if symptoms do not improve, worsen or new problems develop. The patient verbalized understanding and agreed with plan of care.   [name]. [name], FNP-BC [facility] [address] [place], [place] [address] [phone]     This note has been created with Dragon speech recognition software and smart phrase technology. Any transcriptional errors are unintentional.

## 2018-09-15 NOTE — Telephone Encounter (Signed)
Called and spoke with patient, Advised that ultrasound showed cyst on epididymis and that we are referring him to urology for further evaluation. Thanks!

## 2018-09-17 ENCOUNTER — Other Ambulatory Visit: Payer: Self-pay | Admitting: Family Medicine

## 2018-09-17 ENCOUNTER — Other Ambulatory Visit: Payer: Self-pay | Admitting: Internal Medicine

## 2018-09-17 DIAGNOSIS — N50811 Right testicular pain: Secondary | ICD-10-CM | POA: Diagnosis not present

## 2018-09-17 DIAGNOSIS — R3915 Urgency of urination: Secondary | ICD-10-CM | POA: Diagnosis not present

## 2018-09-17 DIAGNOSIS — N430 Encysted hydrocele: Secondary | ICD-10-CM | POA: Diagnosis not present

## 2018-09-17 DIAGNOSIS — N401 Enlarged prostate with lower urinary tract symptoms: Secondary | ICD-10-CM | POA: Diagnosis not present

## 2018-09-17 DIAGNOSIS — R350 Frequency of micturition: Secondary | ICD-10-CM | POA: Diagnosis not present

## 2018-10-10 DIAGNOSIS — J449 Chronic obstructive pulmonary disease, unspecified: Secondary | ICD-10-CM | POA: Diagnosis not present

## 2018-10-10 DIAGNOSIS — J9611 Chronic respiratory failure with hypoxia: Secondary | ICD-10-CM | POA: Diagnosis not present

## 2018-10-17 ENCOUNTER — Other Ambulatory Visit: Payer: Self-pay | Admitting: Family Medicine

## 2018-10-17 DIAGNOSIS — I10 Essential (primary) hypertension: Secondary | ICD-10-CM

## 2018-10-20 ENCOUNTER — Ambulatory Visit: Payer: Medicaid Other | Admitting: Internal Medicine

## 2018-10-20 ENCOUNTER — Encounter: Payer: Self-pay | Admitting: Internal Medicine

## 2018-10-20 VITALS — BP 126/78 | HR 94 | Ht 66.0 in | Wt 267.0 lb

## 2018-10-20 DIAGNOSIS — J9611 Chronic respiratory failure with hypoxia: Secondary | ICD-10-CM | POA: Diagnosis not present

## 2018-10-20 DIAGNOSIS — J449 Chronic obstructive pulmonary disease, unspecified: Secondary | ICD-10-CM

## 2018-10-20 DIAGNOSIS — J9612 Chronic respiratory failure with hypercapnia: Secondary | ICD-10-CM

## 2018-10-20 NOTE — Patient Instructions (Signed)
No change in medications  Try not using the albuterol right before exercise to see if note any difference   Please schedule a follow up visit in 6 months but call sooner if needed

## 2018-10-20 NOTE — Progress Notes (Signed)
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     History of Present Illness  12/21/2015 1st [facility] office visit/ Katriel Cutsforth   Chief Complaint  Patient presents with  . HFU    Pt states that his breathing has improved some, but not baseline for him. He c/o chest congestion and has had some cough with clear sputum. He has been wheezing some. He is using albuterol inhaler 4-5 x per day and neb 2-4 x per day.   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 Follow-up    Increased SOB since the last visit- relates to the humid weather. He states sometimes hears "gurgling in chest". He is using his albuterol inhaler 2-3 x per day and neb once per wk on average.   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 Follow-up    PFT today, SOB, productive cough, headache, dental issues, O2 with activity and sleep   Dyspnea:  20 min on 5lpm walking at park [place] Cough: some am / dark, thick  Sleeping: ok on 4lpm rarely wakes up with HA  SABA use: 4-5 x per week and neb if out in heat  02: 4lpm hs and 5lpm ex   rec Plan A = Automatic = Symbicort 160 Take 2 puffs first thing in am and then another 2 puffs about 12 hours later.  Work on Archivist:  Plan B = Backup Only use your albuterol as a rescue medication  Plan C = Crisis - only use your albuterol nebulizer if you first try Plan B   Daytime goal is to keep 02 sats above 90% at all times > adjust your 02 to get to that level Congratulations on weight loss  Please schedule a follow up visit in 3 months but call sooner if needed    10/20/2018  f/u ov/Amelda Hapke re:  Copd II /obesity  Chief Complaint  Patient presents with  . Follow-up    Breathing is overall doing well. He uses his albuterol inhaler 1 x daily on average. He rarely uses neb.   Dyspnea:  Walking 20 min s stopping on 5lpm never checks sats  Cough: none Sleeping: electric bed vs recliner restless SABA use: as above - uses q d before ex "just in case" 02: 2lpm (self titrated down)   No obvious day to day or daytime variability or assoc excess/ purulent sputum or mucus plugs or hemoptysis or cp or chest tightness, subjective wheeze or overt sinus or hb symptoms.   Sleeping as above  without nocturnal  or early am exacerbation  of respiratory  c/o's or need for noct saba. Also denies any obvious fluctuation of symptoms with weather or environmental changes or other aggravating or alleviating factors except as outlined above   No unusual exposure hx or h/o childhood pna/ asthma  or knowledge of premature birth.  Current Allergies, Complete Past Medical History, Past Surgical History, Family History, and Social History were reviewed in [employer] record.  ROS  The following are not active complaints unless bolded Hoarseness, sore throat, dysphagia, dental problems, itching, sneezing,  nasal congestion or discharge of excess mucus or purulent secretions, ear ache,   fever, chills, sweats, unintended wt loss or wt gain, classically pleuritic or exertional cp,  orthopnea pnd or arm/hand swelling  or leg swelling, presyncope, palpitations, abdominal pain, anorexia, nausea, vomiting, diarrhea  or change in bowel habits or change in bladder habits, change in stools or change in urine, dysuria, hematuria,  rash, arthralgias, visual complaints, headache, numbness, weakness or ataxia or problems with walking or coordination,  change in mood or  memory.        Current Meds  Medication Sig  . acetaminophen (TYLENOL) 500 MG tablet Take 1,000 mg by mouth every 6 (six) hours as needed for moderate pain.  Marland Kitchen albuterol (PROVENTIL) (2.5 MG/3ML) 0.083% nebulizer solution Take 3 mLs (2.5 mg total) by nebulization every 4 (four) hours as needed for wheezing or shortness of breath. DX: J44.9  . ALPRAZolam (XANAX) 0.25 MG tablet Take 1 tablet (0.25 mg total) by mouth 3 (three) times daily as needed for anxiety.  . budesonide-formoterol (SYMBICORT) 160-4.5 MCG/ACT inhaler INHALE 2 PUFFS BY MOUTH FIRST THING IN THE MORNING, AND THEN ANOTHER 2 PUFFS ABOUT 12 HOURS LATER  . busPIRone (BUSPAR) 10 MG tablet TAKE 1 TABLET BY MOUTH TWICE DAILY  . carvedilol (COREG) 6.25 MG tablet TAKE 1 TABLET BY MOUTH TWICE DAILY WITH  A  MEAL  . dextromethorphan-guaiFENesin (MUCINEX DM) 30-600 MG 12hr tablet Take 1 tablet by mouth 2 (two) times daily.  . fluticasone (FLONASE) 50 MCG/ACT nasal spray USE 2 SPRAY(S) IN EACH NOSTRIL ONCE DAILY AS NEEDED FOR  ALLERGIES  OR  RHINITIS  . furosemide  (LASIX) 40 MG tablet Take 1 tablet (40 mg total) by mouth daily. (Patient taking differently: Take 20 mg by mouth daily. )  . furosemide (LASIX) 40 MG tablet TAKE 1 TABLET BY MOUTH ONCE DAILY  . gabapentin (NEURONTIN) 400 MG capsule TAKE 1 CAPSULE BY MOUTH TWICE DAILY  . naproxen (NAPROSYN) 500 MG tablet Take 1 tablet (500 mg total) by mouth 2 (two) times daily with a meal.  . OXYGEN 2 with sleep and exertion  .  PROVENTIL HFA 108 (90 Base) MCG/ACT inhaler INHALE 2 PUFFS BY MOUTH EVERY 6 HOURS AS NEEDED FOR WHEEZING AND FOR SHORTNESS OF BREATH  . tamsulosin (FLOMAX) 0.4 MG CAPS capsule Take 0.4 mg by mouth daily.  Marland Kitchen triamcinolone ointment (KENALOG) 0.5 % APPLY  OINTMENT TOPICALLY TWICE DAILY             Objective:   Physical Exam  amb pleasant wm   04/16/2016          309 >  05/28/2016  305 > 09/03/2016   318 >  02/13/2017  323 >  08/13/2017  326 > 01/13/2018  328 > 04/14/2018  312 > 07/14/2018    290 > 10/20/2018  267  01/17/2016          311   12/20/15 308 lb (139.708 kg)  12/13/15 315 lb (142.883 kg)  12/08/15 312 lb (141.522 kg)      Vital signs reviewed - Note on arrival 02 sats  96% on RA        HEENT: nl dentition, turbinates bilaterally, and oropharynx. Nl external ear canals without cough reflex   NECK :  without JVD/Nodes/TM/ nl carotid upstrokes bilaterally   LUNGS: no acc muscle use,  Nl contour chest which is clear to A and P bilaterally without cough on insp or exp maneuvers   CV:  RRR  no s3 or murmur or increase in P2, and no edema   ABD:  Obese/ nontender with nl inspiratory excursion in the supine position. No bruits or organomegaly appreciated, bowel sounds nl  MS:  Nl gait/ ext warm without deformities, calf tenderness, cyanosis or clubbing No obvious joint restrictions   SKIN: warm and dry without lesions    NEURO:  alert, approp, nl sensorium with  no motor or cerebellar deficits apparent.   Assessment:

## 2018-10-22 ENCOUNTER — Encounter: Payer: Self-pay | Admitting: Internal Medicine

## 2018-10-22 NOTE — Assessment & Plan Note (Signed)
On 02 3lpm since d/c 11/18/2015 - HCO3  34  12/14/15  - 01/17/2016 sats mid 90's RA at rest so rec 2lpm sleeping / exerting but none needed at rest  - 04/16/2016  Walked 4lpm pulsed x 3 laps @ 185 ft each stopped due to end of study, mild sob, no desat  - 08/13/2017  Walked RA x 3 laps @ 185 ft each stopped due to  End of study, nl to mod fast pace, no desat , min sob   - 01/13/2018  Walked RA x 3 laps @ 185 ft each stopped due to  End of study, nl pace, no sob or desat   - HCO3  01/13/2018  =  30 c/w only mild hypercarbia   - ONO RA  04/01/18 desat x 5 h and 20 min so needs 2lpm   - 04/14/2018  Walked RA x 3 laps @ 185 ft each stopped due to  End of study, sats 88% corrected on 2lpm  - 04/22/18  ono on 3lpm = 1h 44 min  So rec 4lpm and repeat on 4lpm > done 05/01/18  only 12 min sat @ < 89% so no change rx   - 10/20/2018 pt titrated down to 2lpm with wt loss and "sleeps great" so no change in noct 02 needed   Advised to check sats walking to be sure keeping > 90%     >>>  F/u can be q 6 months

## 2018-10-22 NOTE — Assessment & Plan Note (Addendum)
12/20/2015  extensive coaching HFA effectiveness =    75% > continue symbicort 160 2bid - Spirometry 01/17/2016  FEV1 1.62 (44%)  Ratio 61 - 05/28/2016   try bevespi 2bid > improved 09/03/2016 but decided liked symbicort better = 80 bid> increased to 160 2bid  08/13/17  - 04/14/2018  After extensive coaching inhaler device  effectiveness =   90% p 4 tries  - 07/14/2018  After extensive coaching inhaler device  effectiveness =    90%  - PFT's  07/14/2018  FEV1 1.80 (57 % ) ratio 60  p 9 % improvement from saba p nothing prior to study with DLCO  94 % corrects to 121 % for alv volume   And erv 16%    Finally turning the corner with wt loss > no change in rx needed in maint rx  In terms of saba use: I spent extra time with pt today reviewing appropriate use of albuterol for prn use on exertion with the following points: 1) saba is for relief of sob that does not improve by walking a slower pace or resting but rather if the pt does not improve after trying this first. 2) If the pt is convinced, as many are, that saba helps recover from activity faster then it's easy to tell if this is the case by re-challenging : ie stop, take the inhaler, then p 5 minutes try the exact same activity (intensity of workload) that just caused the symptoms and see if they are substantially diminished or not after saba 3) if there is an activity that reproducibly causes the symptoms, try the saba 15 min before the activity on alternate days   If in fact the saba really does help, then fine to continue to use it prn but advised may need to look closer at the maintenance regimen being used to achieve better control of airways disease with exertion.

## 2018-10-22 NOTE — Assessment & Plan Note (Signed)
PFTs 07/14/2018  erv =  16%   Body mass index is 43.09 kg/m.  -  trending down nicely/ strongly encouraged  Lab Results  Component Value Date   TSH 2.564 12/08/2015     Contributing to gerd risk/ doe/reviewed the need and the process to achieve and maintain neg calorie balance > defer f/u primary care including intermittently monitoring thyroid status      I had an extended discussion with the patient/wife  reviewing all relevant studies completed to date and  lasting 15 to 20 minutes of a 25 minute visit    Each maintenance medication was reviewed in detail including most importantly the difference between maintenance and prns and under what circumstances the prns are to be triggered using an action plan format that is not reflected in the computer generated alphabetically organized AVS.     Please see AVS for specific instructions unique to this visit that I personally wrote and verbalized to the the pt in detail and then reviewed with pt  by my nurse highlighting any  changes in therapy recommended at today's visit to their plan of care.

## 2018-10-31 ENCOUNTER — Other Ambulatory Visit: Payer: Self-pay

## 2018-10-31 DIAGNOSIS — N451 Epididymitis: Secondary | ICD-10-CM

## 2018-10-31 MED ORDER — NAPROXEN 500 MG PO TABS: 500.0000 mg | ORAL_TABLET | Freq: Two times a day (BID) | ORAL | 0 refills | Status: DC

## 2018-11-09 DIAGNOSIS — J449 Chronic obstructive pulmonary disease, unspecified: Secondary | ICD-10-CM | POA: Diagnosis not present

## 2018-11-09 DIAGNOSIS — J9611 Chronic respiratory failure with hypoxia: Secondary | ICD-10-CM | POA: Diagnosis not present

## 2018-11-14 ENCOUNTER — Other Ambulatory Visit: Payer: Self-pay | Admitting: Family Medicine

## 2018-11-14 ENCOUNTER — Other Ambulatory Visit: Payer: Self-pay | Admitting: Internal Medicine

## 2018-11-14 DIAGNOSIS — G629 Polyneuropathy, unspecified: Secondary | ICD-10-CM

## 2018-11-14 DIAGNOSIS — F411 Generalized anxiety disorder: Secondary | ICD-10-CM

## 2018-11-17 ENCOUNTER — Telehealth: Payer: Self-pay

## 2018-11-17 NOTE — Telephone Encounter (Signed)
Refill request for alprazolam. Please advise. Thanks!  

## 2018-11-19 ENCOUNTER — Other Ambulatory Visit: Payer: Self-pay | Admitting: Family Medicine

## 2018-11-19 DIAGNOSIS — F411 Generalized anxiety disorder: Secondary | ICD-10-CM

## 2018-11-19 MED ORDER — ALPRAZOLAM 0.25 MG PO TABS: 0.1250 mg | ORAL_TABLET | Freq: Three times a day (TID) | ORAL | 2 refills | Status: DC | PRN

## 2018-11-19 NOTE — Telephone Encounter (Signed)
Approved and sent to the pharm.

## 2018-12-10 DIAGNOSIS — J9611 Chronic respiratory failure with hypoxia: Secondary | ICD-10-CM | POA: Diagnosis not present

## 2018-12-10 DIAGNOSIS — J449 Chronic obstructive pulmonary disease, unspecified: Secondary | ICD-10-CM | POA: Diagnosis not present

## 2018-12-22 ENCOUNTER — Other Ambulatory Visit: Payer: Self-pay | Admitting: Family Medicine

## 2018-12-22 DIAGNOSIS — N451 Epididymitis: Secondary | ICD-10-CM

## 2018-12-23 ENCOUNTER — Telehealth: Payer: Self-pay

## 2018-12-23 NOTE — Telephone Encounter (Signed)
Patient is requesting a refill on xanax. Please advise if this can be done. Thanks!

## 2018-12-25 ENCOUNTER — Ambulatory Visit (HOSPITAL_COMMUNITY)
Admission: RE | Admit: 2018-12-25 | Discharge: 2018-12-25 | Disposition: A | Discharge status: Home / Self Care | Payer: Medicaid Other | Source: Ambulatory Visit | Attending: Family Medicine | Admitting: Family Medicine

## 2018-12-25 ENCOUNTER — Ambulatory Visit (INDEPENDENT_AMBULATORY_CARE_PROVIDER_SITE_OTHER): Payer: Medicaid Other | Admitting: Family Medicine

## 2018-12-25 VITALS — BP 104/75 | HR 80 | Temp 99.0°F | Resp 16 | Ht 66.0 in | Wt 244.0 lb

## 2018-12-25 DIAGNOSIS — F411 Generalized anxiety disorder: Secondary | ICD-10-CM

## 2018-12-25 DIAGNOSIS — R3915 Urgency of urination: Secondary | ICD-10-CM | POA: Diagnosis not present

## 2018-12-25 DIAGNOSIS — J209 Acute bronchitis, unspecified: Secondary | ICD-10-CM

## 2018-12-25 DIAGNOSIS — J440 Chronic obstructive pulmonary disease with acute lower respiratory infection: Secondary | ICD-10-CM | POA: Insufficient documentation

## 2018-12-25 DIAGNOSIS — N401 Enlarged prostate with lower urinary tract symptoms: Secondary | ICD-10-CM | POA: Diagnosis not present

## 2018-12-25 DIAGNOSIS — N430 Encysted hydrocele: Secondary | ICD-10-CM | POA: Diagnosis not present

## 2018-12-25 DIAGNOSIS — N50811 Right testicular pain: Secondary | ICD-10-CM | POA: Diagnosis not present

## 2018-12-25 DIAGNOSIS — R0602 Shortness of breath: Secondary | ICD-10-CM | POA: Diagnosis not present

## 2018-12-25 MED ORDER — AMOXICILLIN-POT CLAVULANATE 875-125 MG PO TABS: 1.0000 | ORAL_TABLET | Freq: Two times a day (BID) | ORAL | 0 refills | Status: DC

## 2018-12-25 MED ORDER — ALBUTEROL SULFATE (2.5 MG/3ML) 0.083% IN NEBU
2.5000 mg | INHALATION_SOLUTION | Freq: Once | RESPIRATORY_TRACT | Status: AC
  Administered 2018-12-25: 2.5 mg via RESPIRATORY_TRACT

## 2018-12-25 MED ORDER — METHYLPREDNISOLONE 4 MG PO TBPK: ORAL_TABLET | ORAL | 0 refills | Status: DC

## 2018-12-25 MED ORDER — IPRATROPIUM BROMIDE 0.02 % IN SOLN
0.5000 mg | Freq: Once | RESPIRATORY_TRACT | Status: AC
  Administered 2018-12-25: 0.5 mg via RESPIRATORY_TRACT

## 2018-12-25 MED ORDER — ALPRAZOLAM 0.25 MG PO TABS: 0.1250 mg | ORAL_TABLET | Freq: Three times a day (TID) | ORAL | 2 refills | Status: DC | PRN

## 2018-12-25 NOTE — Progress Notes (Signed)
[facility]   Progress Note: General Provider: Mike Gip, FNP  SUBJECTIVE:   Kenneth Casey is a 63 y.o. male who  has a past medical history of COPD (chronic obstructive pulmonary disease) (HCC), Obese, and Pneumonia.. Patient presents today for Cough (coughing up flem ) and Sore Throat x1.5 weeks.  History of COPD.  Patient states that he is coughing up yellow-green sputum.  He has not taken anything for this.  Also needs refills on alprazolam today.  Denies chest pain.  Shortness of breath at baseline.   Review of Systems  Constitutional: Negative.   HENT: Negative.   Eyes: Negative.   Respiratory: Positive for cough, sputum production, shortness of breath and wheezing. Negative for hemoptysis.   Cardiovascular: Negative.   Gastrointestinal: Negative.   Genitourinary: Negative.   Musculoskeletal: Negative.   Skin: Negative.   Neurological: Negative.   Psychiatric/Behavioral: Negative.      OBJECTIVE: BP 104/75 (BP Location: Left Arm, Patient Position: Sitting, Cuff Size: Large)   Pulse 80   Temp 99 F (37.2 C) (Oral)   Resp 16   Ht 5\' 6"  (1.676 m)   Wt 244 lb (110.7 kg)   SpO2 93%   BMI 39.38 kg/m   Wt Readings from Last 3 Encounters:  12/25/18 244 lb (110.7 kg)  10/20/18 267 lb (121.1 kg)  09/15/18 274 lb (124.3 kg)     Physical Exam Vitals signs and nursing note reviewed.  Constitutional:      General: He is not in acute distress.    Appearance: He is well-developed.  HENT:     Head: Normocephalic and atraumatic.  Eyes:     Conjunctiva/sclera: Conjunctivae normal.     Pupils: Pupils are equal, round, and reactive to light.  Neck:     Musculoskeletal: Normal range of motion.  Cardiovascular:     Rate and Rhythm: Normal rate and regular rhythm.     Heart sounds: Normal heart sounds.  Pulmonary:     Effort: Pulmonary effort is normal. No respiratory distress.     Breath sounds: No stridor. Wheezing  present. No rhonchi or rales.  Abdominal:     General: Bowel sounds are normal. There is no distension.     Palpations: Abdomen is soft.  Musculoskeletal: Normal range of motion.  Skin:    General: Skin is warm and dry.  Neurological:     Mental Status: He is alert and oriented to person, place, and time.  Psychiatric:        Behavior: Behavior normal.        Thought Content: Thought content normal.     ASSESSMENT/PLAN:   1. COPD with acute bronchitis (HCC) - ipratropium (ATROVENT) nebulizer solution 0.5 mg - albuterol (PROVENTIL) (2.5 MG/3ML) 0.083% nebulizer solution 2.5 mg - DG Chest 2 View; Future - methylPREDNISolone (MEDROL DOSEPAK) 4 MG TBPK tablet; Take as directed on pack  Dispense: 21 tablet; Refill: 0 - amoxicillin-clavulanate (AUGMENTIN) 875-125 MG tablet; Take 1 tablet by mouth every 12 (twelve) hours for 7 days.  Dispense: 14 tablet; Refill: 0  2. Generalized anxiety disorder - ALPRAZolam (XANAX) 0.25 MG tablet; Take 0.5-1 tablets (0.125-0.25 mg total) by mouth 3 (three) times daily as needed for anxiety.  Dispense: 60 tablet; Refill: 2  Discussed risk of taking benzodiazepine with  having COPD as this could cause increased respiratory depression and increased infection.  Patient to be tapered off of this medication.  Patient to take half a pill  3 times a day as needed for anxiety.  Return if symptoms worsen or fail to improve.    The patient was given clear instructions to go to ER or return to medical center if symptoms do not improve, worsen or new problems develop. The patient verbalized understanding and agreed with plan of care.   [name]. [name], FNP-BC [facility] [address] [place], [place] [address] [phone]

## 2018-12-25 NOTE — Patient Instructions (Signed)
Good job with the weight loss! Keep it up.   COPD and Physical Activity Chronic obstructive pulmonary disease (COPD) is a long-term (chronic) condition that affects the lungs. COPD is a general term that can be used to describe many different lung problems that cause lung swelling (inflammation) and limit airflow, including chronic bronchitis and emphysema. The main symptom of COPD is shortness of breath, which makes it harder to do even simple tasks. This can also make it harder to exercise and be active. Talk with your health care provider about treatments to help you breathe better and actions you can take to prevent breathing problems during physical activity. What are the benefits of exercising with COPD? Exercising regularly is an important part of a healthy lifestyle. You can still exercise and do physical activities even though you have COPD. Exercise and physical activity improve your shortness of breath by increasing blood flow (circulation). This causes your heart to pump more oxygen through your body. Moderate exercise can improve your:  Oxygen use.  Energy level.  Shortness of breath.  Strength in your breathing muscles.  Heart health.  Sleep.  Self-esteem and feelings of self-worth.  Depression, stress, and anxiety levels. Exercise can benefit everyone with COPD. The severity of your disease may affect how hard you can exercise, especially at first, but everyone can benefit. Talk with your health care provider about how much exercise is safe for you, and which activities and exercises are safe for you. What actions can I take to prevent breathing problems during physical activity?  Sign up for a pulmonary rehabilitation program. This type of program may include: ? Education about lung diseases. ? Exercise classes that teach you how to exercise and be more active while improving your breathing. This usually involves:  Exercise using your lower extremities, such as a  stationary bicycle.  About 30 minutes of exercise, 2 to 5 times per week, for 6 to 12 weeks  Strength training, such as push ups or leg lifts. ? Nutrition education. ? Group classes in which you can talk with others who also have COPD and learn ways to manage stress.  If you use an oxygen tank, you should use it while you exercise. Work with your health care provider to adjust your oxygen for your physical activity. Your resting flow rate is different from your flow rate during physical activity.  While you are exercising: ? Take slow breaths. ? Pace yourself and do not try to go too fast. ? Purse your lips while breathing out. Pursing your lips is similar to a kissing or whistling position. ? If doing exercise that uses a quick burst of effort, such as weight lifting:  Breathe in before starting the exercise.  Breathe out during the hardest part of the exercise (such as raising the weights). Where to find support You can find support for exercising with COPD from:  Your health care provider.  A pulmonary rehabilitation program.  Your local health department or community health programs.  Support groups, online or in-person. Your health care provider may be able to recommend support groups. Where to find more information You can find more information about exercising with COPD from:  American Lung Association: [place].hu.  COPD Foundation: AlmostHot.gl. Contact a health care provider if:  Your symptoms get worse.  You have chest pain.  You have nausea.  You have a fever.  You have trouble talking or catching your breath.  You want to start a new exercise program or a  new activity. Summary  COPD is a general term that can be used to describe many different lung problems that cause lung swelling (inflammation) and limit airflow. This includes chronic bronchitis and emphysema.  Exercise and physical activity improve your shortness of breath by increasing blood  flow (circulation). This causes your heart to provide more oxygen to your body.  Contact your health care provider before starting any exercise program or new activity. Ask your health care provider what exercises and activities are safe for you. This information is not intended to replace advice given to you by your health care provider. Make sure you discuss any questions you have with your health care provider. Document Released: 12/19/2017 Document Revised: 12/19/2017 Document Reviewed: 12/19/2017 Elsevier Interactive Patient Education  2019 [employer].

## 2018-12-26 ENCOUNTER — Telehealth: Payer: Self-pay

## 2018-12-26 NOTE — Telephone Encounter (Signed)
-----   Message from Mike Gip, FNP sent at 12/25/2018  5:56 PM EST ----- No pneumonia noted on the xray. Make sure to take deep breaths throughout the day to open the bases of the lungs. Cough up and spit out sputum. Drink fluids to thin out mucus.

## 2018-12-26 NOTE — Telephone Encounter (Signed)
Called and spoke with patient. Advised that xray was negative for pneumonia. Advised that he should take deep breaths throughout the day to open up the bases of the lungs and to cough up and spit out sputum. Asked that he drink fluids to thin out mucus. Patient verbalized understanding. Thanks !

## 2019-01-01 ENCOUNTER — Encounter: Payer: Self-pay | Admitting: Family Medicine

## 2019-01-10 DIAGNOSIS — J449 Chronic obstructive pulmonary disease, unspecified: Secondary | ICD-10-CM | POA: Diagnosis not present

## 2019-01-10 DIAGNOSIS — J9611 Chronic respiratory failure with hypoxia: Secondary | ICD-10-CM | POA: Diagnosis not present

## 2019-01-12 ENCOUNTER — Other Ambulatory Visit: Payer: Self-pay | Admitting: Family Medicine

## 2019-01-12 ENCOUNTER — Other Ambulatory Visit: Payer: Self-pay | Admitting: Internal Medicine

## 2019-01-12 DIAGNOSIS — J302 Other seasonal allergic rhinitis: Secondary | ICD-10-CM

## 2019-01-12 DIAGNOSIS — F411 Generalized anxiety disorder: Secondary | ICD-10-CM

## 2019-01-14 ENCOUNTER — Ambulatory Visit (INDEPENDENT_AMBULATORY_CARE_PROVIDER_SITE_OTHER): Payer: Medicaid Other | Admitting: Family Medicine

## 2019-01-14 VITALS — BP 130/62 | HR 65 | Temp 98.7°F | Resp 16 | Ht 66.0 in | Wt 246.0 lb

## 2019-01-14 DIAGNOSIS — I5032 Chronic diastolic (congestive) heart failure: Secondary | ICD-10-CM

## 2019-01-14 DIAGNOSIS — J302 Other seasonal allergic rhinitis: Secondary | ICD-10-CM | POA: Diagnosis not present

## 2019-01-14 DIAGNOSIS — Z9981 Dependence on supplemental oxygen: Secondary | ICD-10-CM | POA: Diagnosis not present

## 2019-01-14 DIAGNOSIS — Z1211 Encounter for screening for malignant neoplasm of colon: Secondary | ICD-10-CM

## 2019-01-14 DIAGNOSIS — J449 Chronic obstructive pulmonary disease, unspecified: Secondary | ICD-10-CM | POA: Diagnosis not present

## 2019-01-14 DIAGNOSIS — R7303 Prediabetes: Secondary | ICD-10-CM

## 2019-01-14 DIAGNOSIS — I10 Essential (primary) hypertension: Secondary | ICD-10-CM

## 2019-01-14 LAB — POCT URINALYSIS DIPSTICK
Bilirubin, UA: NEGATIVE
Blood, UA: NEGATIVE
Glucose, UA: NEGATIVE
Ketones, UA: NEGATIVE
Nitrite, UA: NEGATIVE
Protein, UA: NEGATIVE
Spec Grav, UA: 1.02 (ref 1.010–1.025)
Urobilinogen, UA: 1 E.U./dL
pH, UA: 7 (ref 5.0–8.0)

## 2019-01-14 MED ORDER — FLUTICASONE PROPIONATE 50 MCG/ACT NA SUSP: NASAL | 5 refills | Status: DC

## 2019-01-14 NOTE — Patient Instructions (Signed)

## 2019-01-14 NOTE — Progress Notes (Signed)
[facility]   Progress Note: General Provider: Mike GipAndre Arsh Feutz, FNP  SUBJECTIVE:   Kenneth Casey is a 63 y.o. male who  has a past medical history of COPD (chronic obstructive pulmonary disease) (HCC), Obese, and Pneumonia.. Patient presents today for Hypertension  Patient states that he is doing well. Denies side effects of medication. Reports compliance with medications. Patient is exercising and following a heart healthy diet. Continues to have mild chest congestion. Hx of COPD.  Doing well and without complaints.   Review of Systems  Constitutional: Negative.   HENT: Negative.   Eyes: Negative.   Respiratory: Positive for shortness of breath (At baseline) and wheezing.   Cardiovascular: Negative.   Gastrointestinal: Negative.   Genitourinary: Negative.   Musculoskeletal: Negative.   Skin: Negative.   Neurological: Negative.   Psychiatric/Behavioral: Negative.      OBJECTIVE: BP 130/62 (BP Location: Right Arm, Patient Position: Sitting, Cuff Size: Large)   Pulse 65   Temp 98.7 F (37.1 C) (Oral)   Resp 16   Ht 5\' 6"  (1.676 m)   Wt 246 lb (111.6 kg)   SpO2 96%   BMI 39.71 kg/m   Wt Readings from Last 3 Encounters:  01/14/19 246 lb (111.6 kg)  12/25/18 244 lb (110.7 kg)  10/20/18 267 lb (121.1 kg)     Physical Exam Vitals signs and nursing note reviewed.  Constitutional:      General: He is not in acute distress.    Appearance: He is well-developed.  HENT:     Head: Normocephalic and atraumatic.  Eyes:     Conjunctiva/sclera: Conjunctivae normal.     Pupils: Pupils are equal, round, and reactive to light.  Neck:     Musculoskeletal: Normal range of motion.  Cardiovascular:     Rate and Rhythm: Normal rate and regular rhythm.     Heart sounds: Normal heart sounds.  Pulmonary:     Effort: Pulmonary effort is normal. No respiratory distress.     Breath sounds: Wheezing (Mild) present.  Abdominal:     General:  Bowel sounds are normal. There is no distension.     Palpations: Abdomen is soft.  Musculoskeletal: Normal range of motion.  Skin:    General: Skin is warm and dry.  Neurological:     Mental Status: He is alert and oriented to person, place, and time.  Psychiatric:        Behavior: Behavior normal.        Thought Content: Thought content normal.     ASSESSMENT/PLAN:  1. Essential hypertension - Urinalysis Dipstick - Microalbumin, urine - Comprehensive metabolic panel 2. Seasonal allergic rhinitis, unspecified trigger - fluticasone (FLONASE) 50 MCG/ACT nasal spray; USE 2 SPRAY(S) IN EACH NOSTRIL ONCE DAILY AS NEEDED FOR  ALLERGIES  OR  RHINITIS  Dispense: 16 g; Refill: 5 3. COPD GOLD III 4. Chronic diastolic CHF (congestive heart failure) (HCC) 5. On home oxygen therapy 6. Screen for colon cancer - Cologuard 7. Prediabetes - Hemoglobin A1c  Summary: Patient to continue with current medications.  He is followed by pulmonology for his COPD.  Continue with follow-up appointments with pulmonology.  Cologuard ordered for colon cancer screening.  Patient continues to be in the prediabetic range.  Praised for weight loss efforts and encouraged to continue.  Return in about 6 months (around 07/15/2019) for HTN.    The patient was given clear instructions to go to ER or return to medical center if symptoms  do not improve, worsen or new problems develop. The patient verbalized understanding and agreed with plan of care.   [name]. [name], FNP-BC [facility] [address] [place], [place] [address] [phone]

## 2019-01-15 LAB — COMPREHENSIVE METABOLIC PANEL
ALT: 16 IU/L (ref 0–44)
AST: 15 IU/L (ref 0–40)
Albumin/Globulin Ratio: 1.7 (ref 1.2–2.2)
Albumin: 3.8 g/dL (ref 3.8–4.9)
Alkaline Phosphatase: 82 IU/L (ref 39–117)
BUN/Creatinine Ratio: 27 — ABNORMAL HIGH (ref 10–24)
BUN: 22 mg/dL (ref 8–27)
Bilirubin Total: <0.2 mg/dL (ref 0.0–1.2)
CO2: 24 mmol/L (ref 20–29)
Calcium: 9.3 mg/dL (ref 8.6–10.2)
Chloride: 102 mmol/L (ref 96–106)
Creatinine, Ser: 0.81 mg/dL (ref 0.76–1.27)
GFR calc Af Amer: 112 mL/min/{1.73_m2} (ref >59)
GFR calc non Af Amer: 97 mL/min/{1.73_m2} (ref >59)
Globulin, Total: 2.3 g/dL (ref 1.5–4.5)
Glucose: 85 mg/dL (ref 65–99)
Potassium: 4.2 mmol/L (ref 3.5–5.2)
Sodium: 139 mmol/L (ref 134–144)
Total Protein: 6.1 g/dL (ref 6.0–8.5)

## 2019-01-15 LAB — HEMOGLOBIN A1C
Est. average glucose Bld gHb Est-mCnc: 120 mg/dL
Hgb A1c MFr Bld: 5.8 % — ABNORMAL HIGH (ref 4.8–5.6)

## 2019-01-15 LAB — MICROALBUMIN, URINE: Microalbumin, Urine: <3 ug/mL

## 2019-01-20 ENCOUNTER — Other Ambulatory Visit: Payer: Self-pay | Admitting: Family Medicine

## 2019-01-20 DIAGNOSIS — N451 Epididymitis: Secondary | ICD-10-CM

## 2019-01-30 DIAGNOSIS — J449 Chronic obstructive pulmonary disease, unspecified: Secondary | ICD-10-CM | POA: Diagnosis not present

## 2019-01-30 DIAGNOSIS — J9611 Chronic respiratory failure with hypoxia: Secondary | ICD-10-CM | POA: Diagnosis not present

## 2019-02-05 ENCOUNTER — Ambulatory Visit (INDEPENDENT_AMBULATORY_CARE_PROVIDER_SITE_OTHER): Payer: Medicaid Other | Admitting: Family Medicine

## 2019-02-05 ENCOUNTER — Ambulatory Visit (HOSPITAL_COMMUNITY)
Admission: RE | Admit: 2019-02-05 | Discharge: 2019-02-05 | Disposition: A | Discharge status: Home / Self Care | Payer: Medicaid Other | Source: Ambulatory Visit | Attending: Family Medicine | Admitting: Family Medicine

## 2019-02-05 VITALS — BP 111/66 | HR 78 | Temp 98.4°F | Resp 16 | Ht 66.0 in | Wt 242.0 lb

## 2019-02-05 DIAGNOSIS — J441 Chronic obstructive pulmonary disease with (acute) exacerbation: Secondary | ICD-10-CM | POA: Diagnosis not present

## 2019-02-05 DIAGNOSIS — R6883 Chills (without fever): Secondary | ICD-10-CM | POA: Diagnosis not present

## 2019-02-05 DIAGNOSIS — R0602 Shortness of breath: Secondary | ICD-10-CM | POA: Diagnosis not present

## 2019-02-05 LAB — POC INFLUENZA A&B (BINAX/QUICKVUE)
Influenza A, POC: NEGATIVE
Influenza B, POC: NEGATIVE

## 2019-02-05 MED ORDER — ALBUTEROL SULFATE (2.5 MG/3ML) 0.083% IN NEBU
2.5000 mg | INHALATION_SOLUTION | Freq: Once | RESPIRATORY_TRACT | Status: AC
  Administered 2019-02-05: 2.5 mg via RESPIRATORY_TRACT

## 2019-02-05 MED ORDER — CEFTRIAXONE SODIUM 1 G IJ SOLR
1.0000 g | Freq: Once | INTRAMUSCULAR | Status: AC
  Administered 2019-02-05: 1 g via INTRAMUSCULAR

## 2019-02-05 MED ORDER — AZITHROMYCIN 250 MG PO TABS: ORAL_TABLET | ORAL | 0 refills | Status: DC

## 2019-02-05 MED ORDER — PREDNISONE 10 MG (21) PO TBPK: ORAL_TABLET | ORAL | 0 refills | Status: DC

## 2019-02-05 MED ORDER — IPRATROPIUM BROMIDE 0.02 % IN SOLN
0.5000 mg | Freq: Once | RESPIRATORY_TRACT | Status: AC
  Administered 2019-02-05: 0.5 mg via RESPIRATORY_TRACT

## 2019-02-05 NOTE — Patient Instructions (Signed)
Comfort measures to help with symptoms.  Increase fluids and rest. Tylenol or Motrin for fever, chills, and body aches (if not contraindicated). Nasal irrigation, humidifier, lozenges, vapor rub, warm fluids (soups, herbal teas with honey and lemon) and things that make you feel better.  Will call with results of xray.   Chronic Obstructive Pulmonary Disease Exacerbation Chronic obstructive pulmonary disease (COPD) is a long-term (chronic) lung problem. In COPD, the flow of air from the lungs is limited. COPD exacerbations are times that breathing gets worse and you need more than your normal treatment. Without treatment, they can be life threatening. If they happen often, your lungs can become more damaged. If your COPD gets worse, your doctor may treat you with:  Medicines.  Oxygen.  Different ways to clear your airway, such as using a mask. Follow these instructions at home: Medicines  Take over-the-counter and prescription medicines only as told by your doctor.  If you take an antibiotic or steroid medicine, do not stop taking the medicine even if you start to feel better.  Keep up with shots (vaccinations) as told by your doctor. Be sure to get a yearly (annual) flu shot. Lifestyle  Do not smoke. If you need help quitting, ask your doctor.  Eat healthy foods.  Exercise regularly.  Get plenty of sleep.  Avoid tobacco smoke and other things that can bother your lungs.  Wash your hands often with soap and water. This will help keep you from getting an infection. If you cannot use soap and water, use hand sanitizer.  During flu season, avoid areas that are crowded with people. General instructions  Drink enough fluid to keep your pee (urine) clear or pale yellow. Do not do this if your doctor has told you not to.  Use a cool mist machine (vaporizer).  If you use oxygen or a machine that turns medicine into a mist (nebulizer), continue to use it as told.  Follow all  instructions for rehabilitation. These are steps you can take to make your body work better.  Keep all follow-up visits as told by your doctor. This is important. Contact a doctor if:  Your COPD symptoms get worse than normal. Get help right away if:  You are short of breath and it gets worse.  You have trouble talking.  You have chest pain.  You cough up blood.  You have a fever.  You keep throwing up (vomiting).  You feel weak or you pass out (faint).  You feel confused.  You are not able to sleep because of your symptoms.  You are not able to do daily activities. Summary  COPD exacerbations are times that breathing gets worse and you need more treatment than normal.  COPD exacerbations can be very serious and may cause your lungs to become more damaged.  Do not smoke. If you need help quitting, ask your doctor.  Stay up-to-date on your shots. Get a flu shot every year. This information is not intended to replace advice given to you by your health care provider. Make sure you discuss any questions you have with your health care provider. Document Released: 11/15/2011 Document Revised: 12/31/2016 Document Reviewed: 12/31/2016 Elsevier Interactive Patient Education  2019 [employer].

## 2019-02-05 NOTE — Progress Notes (Signed)
[facility]   Progress Note: Sick Visit Provider: Mike Gip, FNP  SUBJECTIVE:   Kenneth Casey is a 63 y.o. male who  has a past medical history of COPD (chronic obstructive pulmonary disease) (HCC), Obese, and Pneumonia.. Patient presents today for Chills; Fever; and Cough (chest congestion ) Fever   This is a new problem. Associated symptoms include coughing, a sore throat and wheezing.  Cough  This is a chronic problem. The current episode started in the past 7 days. The problem has been gradually worsening. Associated symptoms include a fever, myalgias, a sore throat, shortness of breath and wheezing. The symptoms are aggravated by exercise and lying down. He has tried body position changes, a beta-agonist inhaler, ipratropium inhaler and rest for the symptoms. The treatment provided no relief. His past medical history is significant for bronchitis, COPD, environmental allergies and pneumonia.    Review of Systems  Constitutional: Positive for fever.  HENT: Positive for sore throat.   Respiratory: Positive for cough, shortness of breath and wheezing.   Musculoskeletal: Positive for myalgias.  Endo/Heme/Allergies: Positive for environmental allergies.     OBJECTIVE: BP 111/66 (BP Location: Left Arm, Patient Position: Sitting, Cuff Size: Normal)   Pulse 78   Temp 98.4 F (36.9 C) (Oral)   Resp 16   Ht 5\' 6"  (1.676 m)   Wt 242 lb (109.8 kg)   SpO2 98%   PF (!) 2 L/min   BMI 39.06 kg/m   Wt Readings from Last 3 Encounters:  02/05/19 242 lb (109.8 kg)  01/14/19 246 lb (111.6 kg)  12/25/18 244 lb (110.7 kg)     Physical Exam Vitals signs and nursing note reviewed.  Constitutional:      General: He is not in acute distress.    Appearance: He is well-developed.  HENT:     Head: Normocephalic and atraumatic.     Right Ear: Hearing and tympanic membrane normal.     Left Ear: Hearing and tympanic membrane normal.   Nose: Nose normal.     Mouth/Throat:     Mouth: Mucous membranes are moist.  Eyes:     Conjunctiva/sclera: Conjunctivae normal.     Pupils: Pupils are equal, round, and reactive to light.  Neck:     Musculoskeletal: Normal range of motion.  Cardiovascular:     Rate and Rhythm: Normal rate and regular rhythm.     Heart sounds: Normal heart sounds.  Pulmonary:     Effort: Pulmonary effort is normal. No respiratory distress.     Breath sounds: Wheezing present.  Abdominal:     General: Bowel sounds are normal. There is no distension.     Palpations: Abdomen is soft.  Musculoskeletal: Normal range of motion.  Skin:    General: Skin is warm and dry.  Neurological:     Mental Status: He is alert and oriented to person, place, and time.  Psychiatric:        Behavior: Behavior normal.        Thought Content: Thought content normal.     ASSESSMENT/PLAN:  1. COPD exacerbation (HCC) - DG Chest 2 View; Future - CBC with Differential - cefTRIAXone (ROCEPHIN) injection 1 g - azithromycin (ZITHROMAX) 250 MG tablet; Take as directed on pack  Dispense: 6 tablet; Refill: 0 - predniSONE (STERAPRED UNI-PAK 21 TAB) 10 MG (21) TBPK tablet; Take as directed  Dispense: 21 tablet; Refill: 0 - albuterol (PROVENTIL) (2.5 MG/3ML) 0.083% nebulizer solution 2.5 mg -  ipratropium (ATROVENT) nebulizer solution 0.5 mg  2. Chills Negative flu - POC Influenza A&B (Binax test)        The patient was given clear instructions to go to ER or return to medical center if symptoms do not improve, worsen or new problems develop. The patient verbalized understanding and agreed with plan of care.   [name]. [name], FNP-BC [facility] [address] [place], [place] [address] [phone]     This note has been created with Dragon speech recognition software and smart phrase technology. Any transcriptional errors are unintentional.

## 2019-02-06 ENCOUNTER — Telehealth: Payer: Self-pay

## 2019-02-06 DIAGNOSIS — Z20828 Contact with and (suspected) exposure to other viral communicable diseases: Secondary | ICD-10-CM

## 2019-02-06 LAB — CBC WITH DIFFERENTIAL/PLATELET
Basophils Absolute: 0.1 10*3/uL (ref 0.0–0.2)
Basos: 1 %
EOS (ABSOLUTE): 0.2 10*3/uL (ref 0.0–0.4)
Eos: 3 %
Hematocrit: 44.8 % (ref 37.5–51.0)
Hemoglobin: 15.3 g/dL (ref 13.0–17.7)
Immature Grans (Abs): 0 10*3/uL (ref 0.0–0.1)
Immature Granulocytes: 0 %
Lymphocytes Absolute: 1.6 10*3/uL (ref 0.7–3.1)
Lymphs: 18 %
MCH: 29.3 pg (ref 26.6–33.0)
MCHC: 34.2 g/dL (ref 31.5–35.7)
MCV: 86 fL (ref 79–97)
Monocytes Absolute: 1 10*3/uL — ABNORMAL HIGH (ref 0.1–0.9)
Monocytes: 12 %
Neutrophils Absolute: 5.8 10*3/uL (ref 1.4–7.0)
Neutrophils: 66 %
Platelets: 286 10*3/uL (ref 150–450)
RBC: 5.23 x10E6/uL (ref 4.14–5.80)
RDW: 13.3 % (ref 11.6–15.4)
WBC: 8.7 10*3/uL (ref 3.4–10.8)

## 2019-02-06 MED ORDER — OSELTAMIVIR PHOSPHATE 75 MG PO CAPS: 75.0000 mg | ORAL_CAPSULE | Freq: Two times a day (BID) | ORAL | 0 refills | Status: AC

## 2019-02-06 NOTE — Telephone Encounter (Signed)
Called patient, he states his daughter that lives in the house with him was diagnosed with the flu this morning. He is asking if he should be taking any special precautions or be started on tamiflu. Please advise.   He is also asking about xray results. Please advise.

## 2019-02-06 NOTE — Telephone Encounter (Signed)
Sending tamiflu to pharmacy for patient.

## 2019-02-06 NOTE — Telephone Encounter (Signed)
Called and spoke with patient, advised that we have sent in rx for tamiflu to pharmacy. Thanks!

## 2019-02-08 DIAGNOSIS — J449 Chronic obstructive pulmonary disease, unspecified: Secondary | ICD-10-CM | POA: Diagnosis not present

## 2019-02-08 DIAGNOSIS — J9611 Chronic respiratory failure with hypoxia: Secondary | ICD-10-CM | POA: Diagnosis not present

## 2019-02-09 ENCOUNTER — Telehealth: Payer: Self-pay

## 2019-02-09 ENCOUNTER — Other Ambulatory Visit: Payer: Self-pay | Admitting: Family Medicine

## 2019-02-09 DIAGNOSIS — F411 Generalized anxiety disorder: Secondary | ICD-10-CM

## 2019-02-17 ENCOUNTER — Other Ambulatory Visit: Payer: Self-pay | Admitting: Family Medicine

## 2019-02-17 ENCOUNTER — Other Ambulatory Visit: Payer: Self-pay | Admitting: Internal Medicine

## 2019-02-17 DIAGNOSIS — G629 Polyneuropathy, unspecified: Secondary | ICD-10-CM

## 2019-02-24 ENCOUNTER — Other Ambulatory Visit: Payer: Self-pay | Admitting: Internal Medicine

## 2019-02-24 MED ORDER — ALBUTEROL SULFATE (2.5 MG/3ML) 0.083% IN NEBU: INHALATION_SOLUTION | RESPIRATORY_TRACT | 1 refills | Status: DC

## 2019-02-24 NOTE — Telephone Encounter (Signed)
Returned call to patient, confirmed medication and pharmacy. Refill sent. Nothing further is needed at this time.

## 2019-03-11 DIAGNOSIS — J449 Chronic obstructive pulmonary disease, unspecified: Secondary | ICD-10-CM | POA: Diagnosis not present

## 2019-03-11 DIAGNOSIS — J9611 Chronic respiratory failure with hypoxia: Secondary | ICD-10-CM | POA: Diagnosis not present

## 2019-03-16 ENCOUNTER — Encounter: Payer: Self-pay | Admitting: Family Medicine

## 2019-03-17 ENCOUNTER — Other Ambulatory Visit: Payer: Self-pay | Admitting: Family Medicine

## 2019-03-17 ENCOUNTER — Other Ambulatory Visit: Payer: Self-pay | Admitting: Internal Medicine

## 2019-03-17 DIAGNOSIS — G629 Polyneuropathy, unspecified: Secondary | ICD-10-CM

## 2019-03-17 DIAGNOSIS — N451 Epididymitis: Secondary | ICD-10-CM

## 2019-04-07 NOTE — Telephone Encounter (Signed)
Message sent to provider 

## 2019-04-08 NOTE — Telephone Encounter (Signed)
Message sent to provider 

## 2019-04-10 DIAGNOSIS — J449 Chronic obstructive pulmonary disease, unspecified: Secondary | ICD-10-CM | POA: Diagnosis not present

## 2019-04-10 DIAGNOSIS — J9611 Chronic respiratory failure with hypoxia: Secondary | ICD-10-CM | POA: Diagnosis not present

## 2019-04-12 ENCOUNTER — Other Ambulatory Visit: Payer: Self-pay | Admitting: Family Medicine

## 2019-04-12 DIAGNOSIS — I10 Essential (primary) hypertension: Secondary | ICD-10-CM

## 2019-04-16 NOTE — Telephone Encounter (Signed)
Message sent to provider 

## 2019-04-20 ENCOUNTER — Ambulatory Visit: Payer: Medicaid Other | Admitting: Internal Medicine

## 2019-04-22 ENCOUNTER — Ambulatory Visit (INDEPENDENT_AMBULATORY_CARE_PROVIDER_SITE_OTHER): Payer: Medicaid Other | Admitting: Internal Medicine

## 2019-04-22 ENCOUNTER — Other Ambulatory Visit: Payer: Self-pay

## 2019-04-22 ENCOUNTER — Encounter: Payer: Self-pay | Admitting: Internal Medicine

## 2019-04-22 DIAGNOSIS — J9612 Chronic respiratory failure with hypercapnia: Secondary | ICD-10-CM

## 2019-04-22 DIAGNOSIS — J9611 Chronic respiratory failure with hypoxia: Secondary | ICD-10-CM

## 2019-04-22 DIAGNOSIS — J449 Chronic obstructive pulmonary disease, unspecified: Secondary | ICD-10-CM | POA: Diagnosis not present

## 2019-04-22 NOTE — Patient Instructions (Signed)
Be sure to check your 02 saturation with walking to be sure it's staying above 90% - this is the best way to "burn fat"   No change on medications   Only use your albuterol as a rescue medication to be used if you can't catch your breath by pacing yourself better or resting or doing a relaxed purse lip breathing pattern.  - The less you use it, the better it will work when you need it. - Ok to use up to 2 puffs  every 4 hours if you must but call for immediate appointment if use goes up over your usual need - Don't leave home without it !!  (think of it like the spare tire for your car)    Please schedule a follow up visit in 3 months but call sooner if needed  with all medications /inhalers/ solutions in hand so we can verify exactly what you are taking. This includes all medications from all doctors and over the counters

## 2019-04-22 NOTE — Progress Notes (Signed)
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     History of Present Illness  12/21/2015 1st [facility] office visit/ Kenneth Casey   Chief Complaint  Patient presents with  . HFU    Pt states that his breathing has improved some, but not baseline for him. He c/o chest congestion and has had some cough with clear sputum. He has been wheezing some. He is using albuterol inhaler 4-5 x per day and neb 2-4 x per day.   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 Follow-up    Increased SOB since the last visit- relates to the humid weather. He states sometimes hears "gurgling in chest". He is using his albuterol inhaler 2-3 x per day and neb once per wk on average.   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 Follow-up    PFT today, SOB, productive cough, headache, dental issues, O2 with activity and sleep   Dyspnea:  20 min on 5lpm walking at park some hills Cough: some am / dark, thick  Sleeping: ok on 4lpm rarely wakes up with HA  SABA use: 4-5 x per week and neb if out in heat  02: 4lpm hs and 5lpm ex   rec Plan A = Automatic = Symbicort 160 Take 2 puffs first thing in am and then another 2 puffs about 12 hours later.  Work on Gaffer:  Plan B = Backup Only use your albuterol as a rescue medication  Plan C = Crisis - only use your albuterol nebulizer if you first try Plan B   Daytime goal is to keep 02 sats above 90% at all times > adjust your 02 to get to that level Congratulations on weight loss  Please schedule a follow up visit in 3 months but call sooner if needed    10/20/2018  f/u ov/Kenneth Casey re:  Copd II /obesity  Chief Complaint  Patient presents with  . Follow-up    Breathing is overall doing well. He uses his albuterol inhaler 1 x daily on average. He rarely uses neb.   Dyspnea:  Walking 20 min s stopping on 5lpm never checks sats  Cough: none Sleeping: electric bed vs recliner restless SABA use: as above - uses q d before ex "just in case" 02: 2lpm (self titrated down)  rec No change in medications Try not using the albuterol right before exercise to see if note any difference     Virtual Visit via Telephone Note 04/22/2019 re copd/ 02 dep hs and ex   I connected with Kenneth Casey on 04/22/19 at  8:45 AM EDT by telephone and verified that I am speaking with the correct person using two identifiers.   I discussed the limitations, risks, security and privacy concerns of performing an evaluation and management service by telephone and the availability of in  person appointments. I also discussed with the patient that there may be a patient responsible charge related to this service. The patient expressed understanding and agreed to proceed.   History of Present Illness:  had rough winter with sev flares req prednisone  Dyspnea: 15 min s stopping some inclines and wt down to 228 / not monitoring sats Cough: some with nose running  Sleeping: still some in recliner maybe 30 degrees  SABA use: still using saba before 02: ex  uses poc / not using at rest, 2.5 hs   No obvious day to day or daytime variability or assoc excess/ purulent sputum or mucus plugs or hemoptysis or cp or chest tightness, subjective wheeze or overt sinus or hb symptoms.   Also denies any obvious fluctuation of symptoms with weather or environmental changes or other aggravating or alleviating factors except as outlined above.   Meds reviewed/ med reconciliation completed         Observations/Objective: Good phonation/ attitude and oulook, speaking full sentences    Assessment and Plan: See problem list for active a/p's   Follow Up Instructions: See avs for instructions unique to this ov which includes revised/ updated med list     I discussed the assessment and treatment plan with the patient. The patient was provided an opportunity to ask questions and all were answered. The patient agreed with the plan and demonstrated an understanding of the instructions.   The patient was advised to call back or seek an in-person evaluation if the symptoms worsen or if the condition fails to improve as anticipated.  I provided 25 minutes of non-face-to-face time during this encounter.   Sandrea Hughs, MD

## 2019-04-22 NOTE — Assessment & Plan Note (Signed)
On 02 3lpm since d/c 11/18/2015 - HCO3  34  12/14/15  - 01/17/2016 sats mid 90's RA at rest so rec 2lpm sleeping / exerting but none needed at rest  - 04/16/2016  Walked 4lpm pulsed x 3 laps @ 185 ft each stopped due to end of study, mild sob, no desat  - 08/13/2017  Walked RA x 3 laps @ 185 ft each stopped due to  End of study, nl to mod fast pace, no desat , min sob   - 01/13/2018  Walked RA x 3 laps @ 185 ft each stopped due to  End of study, nl pace, no sob or desat   - HCO3  01/13/2018  =  30 c/w only mild hypercarbia   - ONO RA  04/01/18 desat x 5 h and 20 min so needs 2lpm   - 04/14/2018  Walked RA x 3 laps @ 185 ft each stopped due to  End of study, sats 88% corrected on 2lpm  - 04/22/18  ono on 3lpm = 1h 44 min  So rec 4lpm and repeat on 4lpm > done 05/01/18  only 12 min sat @ < 89% so no change rx  - 10/20/2018 pt titrated down to 2lpm with wt loss and "sleeps great"     Adequate control on present rx, reviewed in detail with pt > no change in rx needed  / advised to keep up with monitoring sats with ex with goal of > 90% and walking up to 30 min daily (learn to pace better)

## 2019-04-22 NOTE — Assessment & Plan Note (Signed)
Quit smoking 2012  12/20/2015  extensive coaching HFA effectiveness =    75% > continue symbicort 160 2bid - Spirometry 01/17/2016  FEV1 1.62 (44%)  Ratio 61 - 05/28/2016   try bevespi 2bid > improved 09/03/2016 but decided liked symbicort better = 80 bid> increased to 160 2bid  08/13/17  - 04/14/2018  After extensive coaching inhaler device  effectiveness =   90% p 4 tries  - 07/14/2018  After extensive coaching inhaler device  effectiveness =    90%  - PFT's  07/14/2018  FEV1 1.80 (57 % ) ratio 60  p 9 % improvement from saba p nothing prior to study with DLCO  94 % corrects to 121 % for alv volume   And erv 16%     Adequate control on present rx, reviewed in detail with pt > no change in rx needed    Reviewed use of saba I spent extra time with pt today reviewing appropriate use of albuterol for prn use on exertion with the following points: 1) saba is for relief of sob that does not improve by walking a slower pace or resting but rather if the pt does not improve after trying this first. 2) If the pt is convinced, as many are, that saba helps recover from activity faster then it's easy to tell if this is the case by re-challenging : ie stop, take the inhaler, then p 5 minutes try the exact same activity (intensity of workload) that just caused the symptoms and see if they are substantially diminished or not after saba 3) if there is an activity that reproducibly causes the symptoms, try the saba 15 min before the activity on alternate days   If in fact the saba really does help, then fine to continue to use it prn but advised may need to look closer at the maintenance regimen being used to achieve better control of airways disease with exertion.

## 2019-04-22 NOTE — Assessment & Plan Note (Signed)
Wt Readings from Last 3 Encounters:  02/05/19 242 lb (109.8 kg)  01/14/19 246 lb (111.6 kg)  12/25/18 244 lb (110.7 kg)       Says wt down to 228/ encouraged to keep up the good work   Each maintenance medication was reviewed in detail including most importantly the difference between maintenance and as needed and under what circumstances the prns are to be used.  Please see AVS for specific  Instructions which are unique to this visit and I personally typed out  which were reviewed in detail over the phone  with the patient and a copy provided.     F/u q 3 m with all meds in hand using a trust but verify approach to confirm accurate Medication  Reconciliation The principal here is that until we are certain that the  patients are doing what we've asked, it makes no sense to ask them to do more.

## 2019-05-11 DIAGNOSIS — J449 Chronic obstructive pulmonary disease, unspecified: Secondary | ICD-10-CM | POA: Diagnosis not present

## 2019-05-11 DIAGNOSIS — J9611 Chronic respiratory failure with hypoxia: Secondary | ICD-10-CM | POA: Diagnosis not present

## 2019-05-12 ENCOUNTER — Other Ambulatory Visit: Payer: Self-pay | Admitting: Family Medicine

## 2019-05-12 DIAGNOSIS — F411 Generalized anxiety disorder: Secondary | ICD-10-CM

## 2019-05-13 NOTE — Telephone Encounter (Signed)
Refill request

## 2019-06-04 DIAGNOSIS — J9611 Chronic respiratory failure with hypoxia: Secondary | ICD-10-CM | POA: Diagnosis not present

## 2019-06-04 DIAGNOSIS — J449 Chronic obstructive pulmonary disease, unspecified: Secondary | ICD-10-CM | POA: Diagnosis not present

## 2019-06-10 ENCOUNTER — Other Ambulatory Visit: Payer: Self-pay | Admitting: Family Medicine

## 2019-06-10 ENCOUNTER — Other Ambulatory Visit: Payer: Self-pay | Admitting: Internal Medicine

## 2019-06-10 DIAGNOSIS — J449 Chronic obstructive pulmonary disease, unspecified: Secondary | ICD-10-CM | POA: Diagnosis not present

## 2019-06-10 DIAGNOSIS — F411 Generalized anxiety disorder: Secondary | ICD-10-CM

## 2019-06-10 DIAGNOSIS — G629 Polyneuropathy, unspecified: Secondary | ICD-10-CM

## 2019-06-10 DIAGNOSIS — J9611 Chronic respiratory failure with hypoxia: Secondary | ICD-10-CM | POA: Diagnosis not present

## 2019-06-10 DIAGNOSIS — N451 Epididymitis: Secondary | ICD-10-CM

## 2019-06-10 DIAGNOSIS — I10 Essential (primary) hypertension: Secondary | ICD-10-CM

## 2019-06-11 NOTE — Telephone Encounter (Signed)
Refill request for xanax. Please advise.  

## 2019-06-22 ENCOUNTER — Ambulatory Visit: Payer: Medicaid Other | Admitting: Internal Medicine

## 2019-07-09 ENCOUNTER — Other Ambulatory Visit: Payer: Self-pay | Admitting: Family Medicine

## 2019-07-09 DIAGNOSIS — J302 Other seasonal allergic rhinitis: Secondary | ICD-10-CM

## 2019-07-09 DIAGNOSIS — G629 Polyneuropathy, unspecified: Secondary | ICD-10-CM

## 2019-07-09 DIAGNOSIS — N451 Epididymitis: Secondary | ICD-10-CM

## 2019-07-09 DIAGNOSIS — F411 Generalized anxiety disorder: Secondary | ICD-10-CM

## 2019-07-10 NOTE — Telephone Encounter (Signed)
Refill request for xanax. Please advise. Thanks!  

## 2019-07-11 DIAGNOSIS — J449 Chronic obstructive pulmonary disease, unspecified: Secondary | ICD-10-CM | POA: Diagnosis not present

## 2019-07-11 DIAGNOSIS — J9611 Chronic respiratory failure with hypoxia: Secondary | ICD-10-CM | POA: Diagnosis not present

## 2019-07-13 ENCOUNTER — Telehealth: Payer: Self-pay | Admitting: Family Medicine

## 2019-07-13 NOTE — Telephone Encounter (Signed)
These were sent in on 07/08/2019. Thanks!

## 2019-07-15 ENCOUNTER — Ambulatory Visit (INDEPENDENT_AMBULATORY_CARE_PROVIDER_SITE_OTHER): Payer: Medicaid Other | Admitting: Family Medicine

## 2019-07-15 ENCOUNTER — Ambulatory Visit (INDEPENDENT_AMBULATORY_CARE_PROVIDER_SITE_OTHER): Payer: Medicaid Other

## 2019-07-15 ENCOUNTER — Ambulatory Visit (INDEPENDENT_AMBULATORY_CARE_PROVIDER_SITE_OTHER): Payer: Medicaid Other | Admitting: Internal Medicine

## 2019-07-15 ENCOUNTER — Encounter: Payer: Self-pay | Admitting: Internal Medicine

## 2019-07-15 ENCOUNTER — Other Ambulatory Visit: Payer: Self-pay

## 2019-07-15 VITALS — BP 129/58 | HR 59 | Temp 98.5°F | Resp 16 | Ht 70.0 in | Wt 232.0 lb

## 2019-07-15 DIAGNOSIS — R7303 Prediabetes: Secondary | ICD-10-CM

## 2019-07-15 DIAGNOSIS — J449 Chronic obstructive pulmonary disease, unspecified: Secondary | ICD-10-CM

## 2019-07-15 DIAGNOSIS — J9611 Chronic respiratory failure with hypoxia: Secondary | ICD-10-CM

## 2019-07-15 DIAGNOSIS — I5032 Chronic diastolic (congestive) heart failure: Secondary | ICD-10-CM

## 2019-07-15 DIAGNOSIS — J9612 Chronic respiratory failure with hypercapnia: Secondary | ICD-10-CM

## 2019-07-15 DIAGNOSIS — Z23 Encounter for immunization: Secondary | ICD-10-CM | POA: Diagnosis not present

## 2019-07-15 DIAGNOSIS — I10 Essential (primary) hypertension: Secondary | ICD-10-CM | POA: Diagnosis not present

## 2019-07-15 LAB — POCT GLYCOSYLATED HEMOGLOBIN (HGB A1C): Hemoglobin A1C: 5.6 % (ref 4.0–5.6)

## 2019-07-15 LAB — POCT URINALYSIS DIPSTICK
Bilirubin, UA: NEGATIVE
Blood, UA: NEGATIVE
Glucose, UA: NEGATIVE
Ketones, UA: NEGATIVE
Leukocytes, UA: NEGATIVE
Nitrite, UA: NEGATIVE
Protein, UA: NEGATIVE
Spec Grav, UA: 1.025 (ref 1.010–1.025)
Urobilinogen, UA: 0.2 E.U./dL
pH, UA: 6 (ref 5.0–8.0)

## 2019-07-15 NOTE — Patient Instructions (Signed)
Please remember to go to the x-ray department   for your tests - we will call you with the results when they are available.     Please schedule a follow up visit in  12 months but call sooner if needed.    

## 2019-07-15 NOTE — Progress Notes (Signed)
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     History of Present Illness  12/21/2015 1st [facility] office visit/ Wert   Chief Complaint  Patient presents with  . HFU    Pt states that his breathing has improved some, but not baseline for him. He c/o chest congestion and has had some cough with clear sputum. He has been wheezing some. He is using albuterol inhaler 4-5 x per day and neb 2-4 x per day.   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 Follow-up    Increased SOB since the last visit- relates to the humid weather. He states sometimes hears "gurgling in chest". He is using his albuterol inhaler 2-3 x per day and neb once per wk on average.   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 Follow-up    PFT today, SOB, productive cough, headache, dental issues, O2 with activity and sleep   Dyspnea:  20 min on 5lpm walking at park some hills Cough: some am / dark, thick  Sleeping: ok on 4lpm rarely wakes up with HA  SABA use: 4-5 x per week and neb if out in heat  02: 4lpm hs and 5lpm ex   rec Plan A = Automatic = Symbicort 160 Take 2 puffs first thing in am and then another 2 puffs about 12 hours later.  Work on Gaffer:  Plan B = Backup Only use your albuterol as a rescue medication  Plan C = Crisis - only use your albuterol nebulizer if you first try Plan B   Daytime goal is to keep 02 sats above 90% at all times > adjust your 02 to get to that level Congratulations on weight loss  Please schedule a follow up visit in 3 months but call sooner if needed    10/20/2018  f/u ov/Wert re:  Copd II /obesity  Chief Complaint  Patient presents with  . Follow-up    Breathing is overall doing well. He uses his albuterol inhaler 1 x daily on average. He rarely uses neb.   Dyspnea:  Walking 20 min s stopping on 5lpm never checks sats  Cough: none Sleeping: electric bed vs recliner restless SABA use: as above - uses q d before ex "just in case" 02: 2lpm (self titrated down)  rec Be sure to check your 02 saturation with walking to be sure it's staying above 90% - this is the best way to "burn fat"  No change on medications  Only use your albuterol as rescue    07/15/2019  f/u ov/Wert re:  Copd II / obesity  maint rx symb 160 2bid  Chief Complaint  Patient presents with  . Follow-up    Breathing is overall doing well. He is using his albuterol inhaler at least once per day and neb every night before bed.   Dyspnea:  Walking up to 5 min on 2lpm doesn't check sats as  rec Cough: minimal / mucoid  Sleeping: 30 degrees  SABA use: uses prior to walking  02: 2lpm 24 / 7 does not titrate    No obvious day to day or daytime variability or assoc excess/ purulent sputum or mucus plugs or hemoptysis or cp or chest tightness, subjective wheeze or overt sinus or hb symptoms.   Sleeping as above  without nocturnal  or early am exacerbation  of respiratory  c/o's or need for noct saba. Also denies any obvious fluctuation of symptoms with weather or environmental changes or other aggravating or alleviating factors except as outlined above   No unusual exposure hx or h/o childhood pna/ asthma or knowledge of premature birth.  Current Allergies, Complete Past Medical History, Past Surgical History, Family History, and Social History were reviewed in [facility] electronic medical record.  ROS  The following are not active complaints unless bolded Hoarseness, sore throat, dysphagia, dental problems, itching, sneezing,  nasal congestion or discharge of excess mucus or purulent secretions, ear ache,   fever, chills, sweats, unintended wt loss or wt gain, classically pleuritic or exertional cp,  orthopnea pnd or arm/hand swelling  or leg swelling, presyncope, palpitations, abdominal pain, anorexia, nausea, vomiting, diarrhea  or change in bowel habits or change in bladder habits, change in stools or change in urine, dysuria, hematuria,  rash, arthralgias, visual complaints, headache, numbness, weakness or ataxia or problems with walking or coordination,  change in mood or  memory.        Current Meds  Medication Sig  . acetaminophen (TYLENOL) 500 MG tablet Take 1,000 mg by mouth every 6 (six) hours as needed for moderate pain.  Marland Kitchen. albuterol (PROVENTIL) (2.5 MG/3ML) 0.083% nebulizer solution USE 1 VIAL IN NEBULIZER EVERY 4 HOURS AS NEEDED FOR WHEEZING OR SHORTNESS OF BREATH  . ALPRAZolam (XANAX) 0.25 MG tablet TAKE 1/2 (ONE-HALF) TABLET BY MOUTH THREE TIMES DAILY AS NEEDED  FOR ANXIETY (WEANING  DOWN)  . budesonide-formoterol (SYMBICORT) 160-4.5 MCG/ACT inhaler INHALE 2 PUFFS BY MOUTH FIRST THING IN THE MORNING, AND THEN ANOTHER 2 PUFFS ABOUT 12 HOURS LATER  . busPIRone (BUSPAR) 10 MG tablet TAKE 1 TABLET BY MOUTH TWICE DAILY  . carvedilol (COREG) 6.25 MG tablet TAKE 1 TABLET BY MOUTH TWICE DAILY WITH A MEAL  .  dextromethorphan-guaiFENesin (MUCINEX DM) 30-600 MG 12hr tablet Take 1 tablet by mouth 2 (two) times daily.  . fluticasone (FLONASE) 50 MCG/ACT nasal spray USE 2 SPRAY(S) IN EACH NOSTRIL ONCE DAILY AS NEEDED FOR  ALLERGIES  OR  RHINITIS  . furosemide (LASIX) 40 MG tablet TAKE 1 TABLET BY MOUTH ONCE DAILY (Patient taking differently: As needed)  . gabapentin (NEURONTIN) 400 MG capsule Take 1 capsule by mouth twice daily  . naproxen (NAPROSYN) 500 MG tablet TAKE 1 TABLET BY MOUTH TWICE DAILY WITH A MEAL  . OXYGEN 2 with sleep and exertion  . PROVENTIL HFA 108 (90 Base) MCG/ACT inhaler INHALE 2 PUFFS BY MOUTH EVERY 6 HOURS AS NEEDED FOR WHEEZING FOR SHORTNESS OF BREATH  . tamsulosin (FLOMAX) 0.4 MG CAPS capsule Take 0.4 mg by mouth daily.  Marland Kitchen. triamcinolone ointment (KENALOG) 0.5 % APPLY  OINTMENT TOPICALLY TWICE DAILY         Objective:   Physical Exam   amb pleasant mod obese wm nad   04/16/2016          309 >  05/28/2016  305 > 09/03/2016   318 >  02/13/2017  323 >  08/13/2017  326 > 01/13/2018  328 > 04/14/2018  312 > 07/14/2018    290 > 10/20/2018  267 > 07/15/2019  232  01/17/2016          311   12/20/15 308 lb (139.708 kg)  12/13/15 315 lb (142.883 kg)  12/08/15 312 lb (141.522 kg)      Vital signs reviewed - Note on arrival 02 sats  99% on RA      HEENT: nl dentition / oropharynx. Nl external ear canals without cough reflex -  Mild bilateral non-specific turbinate edema     NECK :  without JVD/Nodes/TM/ nl carotid upstrokes bilaterally   LUNGS: no acc muscle use,  Mild barrel  contour chest wall with bilateral  Distant bs s audible wheeze and  without cough  on insp or exp maneuver and mild  Hyperresonant  to  percussion bilaterally     CV:  RRR  no s3 or murmur or increase in P2, and no edema   ABD:  soft and nontender with pos end  insp Hoover's  in the supine position. No bruits or organomegaly appreciated, bowel sounds nl  MS:   Nl gait/  ext warm without deformities, calf tenderness, cyanosis or clubbing No obvious joint restrictions   SKIN: warm and dry without lesions    NEURO:  alert, approp, nl sensorium with  no motor or cerebellar deficits apparent.          CXR PA and Lateral:   07/15/2019 :    I personally reviewed images and agree with radiology impression as follows:    No active cardiopulmonary disease.      Assessment:

## 2019-07-15 NOTE — Progress Notes (Signed)
[facility]   Progress Note: General Provider: Lanae Boast, FNP  SUBJECTIVE:   Kenneth Casey is a 63 y.o. male who  has a past medical history of COPD (chronic obstructive pulmonary disease) ([place]), Obese, and Pneumonia.. Patient presents today for Hypertension, Follow-up (pre-diabetes ), and Anxiety   Hypertension This is a chronic problem. The current episode started more than 1 year ago. The problem has been gradually improving since onset. The problem is controlled. Associated symptoms include anxiety. Risk factors for coronary artery disease include obesity and male gender. Past treatments include lifestyle changes. The current treatment provides significant improvement. There are no compliance problems.    Last A1c on 01/14/2019 was 5.8.He continues to lose weight and is exercising every day. He is followed by pulmonology for COPD. No problem or concerns today.   Review of Systems  Constitutional: Negative.   HENT: Negative.   Eyes: Negative.   Respiratory: Negative.   Cardiovascular: Negative.   Gastrointestinal: Negative.   Genitourinary: Negative.   Musculoskeletal: Negative.   Skin: Negative.   Neurological: Negative.   Psychiatric/Behavioral: Negative.      OBJECTIVE: BP (!) 129/58 (BP Location: Right Arm, Patient Position: Sitting, Cuff Size: Large)   Pulse (!) 59   Temp 98.5 F (36.9 C) (Oral)   Resp 16   Ht 5\' 10"  (1.778 m)   Wt 232 lb (105.2 kg)   SpO2 98%   BMI 33.29 kg/m   Wt Readings from Last 3 Encounters:  07/15/19 232 lb (105.2 kg)  07/15/19 232 lb (105.2 kg)  02/05/19 242 lb (109.8 kg)     Physical Exam Vitals signs and nursing note reviewed.  Constitutional:      General: He is not in acute distress.    Appearance: Normal appearance.  HENT:     Head: Normocephalic and atraumatic.  Eyes:     Extraocular Movements: Extraocular movements intact.     Conjunctiva/sclera: Conjunctivae normal.     Pupils: Pupils are equal, round, and reactive to light.  Cardiovascular:     Rate and Rhythm: Normal rate and regular rhythm.     Heart sounds: No murmur.  Pulmonary:     Effort: Pulmonary effort is normal.     Breath sounds: Normal breath sounds.  Musculoskeletal: Normal range of motion.  Skin:    General: Skin is warm and dry.  Neurological:     Mental Status: He is alert and oriented to person, place, and time.  Psychiatric:        Mood and Affect: Mood normal.        Behavior: Behavior normal.        Thought Content: Thought content normal.        Judgment: Judgment normal.     ASSESSMENT/PLAN:   1. Essential hypertension - HgB A1c - Urinalysis Dipstick  2. Chronic diastolic CHF (congestive heart failure) (HCC)  3. Stage 3 severe COPD by GOLD classification ([place])  4. Prediabetes Patient is being followed by a specialist for this condition. Patient advised to continue with follow up appointments. PCP will continue to monitor progress.  The patient is asked to make an attempt to improve diet and exercise patterns to aid in medical management.     Return in about 3 months (around 10/15/2019) for htn.    The patient was given clear instructions to go to ER or return to medical center if symptoms do not improve, worsen or new problems develop. The patient  verbalized understanding and agreed with plan of care.   Ms. Doug Sou. Nathaneil Canary, FNP-BC [facility] [address] [place], [place] [address] [phone]

## 2019-07-15 NOTE — Patient Instructions (Signed)

## 2019-07-16 NOTE — Progress Notes (Signed)
Spoke with pt and notified of results per Dr. Wert. Pt verbalized understanding and denied any questions. 

## 2019-07-19 ENCOUNTER — Encounter: Payer: Self-pay | Admitting: Internal Medicine

## 2019-07-19 NOTE — Assessment & Plan Note (Signed)
Quit smoking 2012  12/20/2015  extensive coaching HFA effectiveness =    75% > continue symbicort 160 2bid - Spirometry 01/17/2016  FEV1 1.62 (44%)  Ratio 61 - 05/28/2016   try bevespi 2bid > improved 09/03/2016 but decided liked symbicort better = 80 bid> increased to 160 2bid  08/13/17  - 04/14/2018  After extensive coaching inhaler device  effectiveness =   90% p 4 tries  - 07/14/2018  After extensive coaching inhaler device  effectiveness =    90%  - PFT's  07/14/2018  FEV1 1.80 (57 % ) ratio 60  p 9 % improvement from saba p nothing prior to study with DLCO  94 % corrects to 121 % for alv volume   And erv 16%    Adequate control on present rx, reviewed in detail with pt > no change in rx needed

## 2019-07-19 NOTE — Assessment & Plan Note (Addendum)
On 02 3lpm since d/c 11/18/2015 - HCO3  34  12/14/15  - 01/17/2016 sats mid 90's RA at rest so rec 2lpm sleeping / exerting but none needed at rest  - 04/16/2016  Walked 4lpm pulsed x 3 laps @ 185 ft each stopped due to end of study, mild sob, no desat  - 08/13/2017  Walked RA x 3 laps @ 185 ft each stopped due to  End of study, nl to mod fast pace, no desat , min sob   - 01/13/2018  Walked RA x 3 laps @ 185 ft each stopped due to  End of study, nl pace, no sob or desat   - HCO3  01/13/2018  =  30 c/w only mild hypercarbia   - ONO RA  04/01/18 desat x 5 h and 20 min so needs 2lpm   - 04/14/2018  Walked RA x 3 laps @ 185 ft each stopped due to  End of study, sats 88% corrected on 2lpm  - 04/22/18  ono on 3lpm = 1h 44 min  So rec 4lpm and repeat on 4lpm > done 05/01/18  only 12 min sat @ < 89% so no change rx  - 10/20/2018 pt titrated down to 2lpm with wt loss and "sleeps great"    Reminded to titrate 02 with exp  to keep sats > 90% with ex to help "burn fat"

## 2019-07-19 NOTE — Assessment & Plan Note (Signed)
PFTs 07/14/2018  erv =  16%   Body mass index is 33.29 kg/m.  -  trending down nicely / encouraged  Lab Results  Component Value Date   TSH 2.564 12/08/2015     Contributing to gerd risk/ doe/reviewed the need and the process to achieve and maintain neg calorie balance > defer f/u primary care including intermittently monitoring thyroid status     .I had an extended discussion with the patient reviewing all relevant studies completed to date and  lasting 15 to 20 minutes of a 25 minute visit    Each maintenance medication was reviewed in detail including most importantly the difference between maintenance and prns and under what circumstances the prns are to be triggered using an action plan format that is not reflected in the computer generated alphabetically organized AVS.     Please see AVS for specific instructions unique to this visit that I personally wrote and verbalized to the the pt in detail and then reviewed with pt  by my nurse highlighting any  changes in therapy recommended at today's visit to their plan of care.

## 2019-08-11 ENCOUNTER — Other Ambulatory Visit: Payer: Self-pay | Admitting: Family Medicine

## 2019-08-11 DIAGNOSIS — J449 Chronic obstructive pulmonary disease, unspecified: Secondary | ICD-10-CM | POA: Diagnosis not present

## 2019-08-11 DIAGNOSIS — G629 Polyneuropathy, unspecified: Secondary | ICD-10-CM

## 2019-08-11 DIAGNOSIS — J302 Other seasonal allergic rhinitis: Secondary | ICD-10-CM

## 2019-08-11 DIAGNOSIS — I10 Essential (primary) hypertension: Secondary | ICD-10-CM

## 2019-08-11 DIAGNOSIS — N451 Epididymitis: Secondary | ICD-10-CM

## 2019-08-11 DIAGNOSIS — F411 Generalized anxiety disorder: Secondary | ICD-10-CM

## 2019-08-11 DIAGNOSIS — J9611 Chronic respiratory failure with hypoxia: Secondary | ICD-10-CM | POA: Diagnosis not present

## 2019-08-12 NOTE — Telephone Encounter (Signed)
Refill request for alprazolam. Please advise.

## 2019-08-14 ENCOUNTER — Encounter: Payer: Self-pay | Admitting: Family Medicine

## 2019-08-19 ENCOUNTER — Encounter (HOSPITAL_COMMUNITY): Payer: Self-pay

## 2019-08-19 ENCOUNTER — Encounter (HOSPITAL_COMMUNITY): Payer: Self-pay | Admitting: *Deleted

## 2019-09-10 ENCOUNTER — Other Ambulatory Visit: Payer: Self-pay | Admitting: Internal Medicine

## 2019-09-10 ENCOUNTER — Other Ambulatory Visit: Payer: Self-pay | Admitting: Family Medicine

## 2019-09-10 DIAGNOSIS — F411 Generalized anxiety disorder: Secondary | ICD-10-CM

## 2019-09-10 DIAGNOSIS — J449 Chronic obstructive pulmonary disease, unspecified: Secondary | ICD-10-CM | POA: Diagnosis not present

## 2019-09-10 DIAGNOSIS — N451 Epididymitis: Secondary | ICD-10-CM

## 2019-09-10 DIAGNOSIS — G629 Polyneuropathy, unspecified: Secondary | ICD-10-CM

## 2019-09-10 DIAGNOSIS — J302 Other seasonal allergic rhinitis: Secondary | ICD-10-CM

## 2019-09-10 DIAGNOSIS — J9611 Chronic respiratory failure with hypoxia: Secondary | ICD-10-CM | POA: Diagnosis not present

## 2019-09-10 NOTE — Telephone Encounter (Signed)
Refill request for Xanax 0.25mg . please advise.

## 2019-09-16 DIAGNOSIS — Z23 Encounter for immunization: Secondary | ICD-10-CM | POA: Diagnosis not present

## 2019-10-11 DIAGNOSIS — J9611 Chronic respiratory failure with hypoxia: Secondary | ICD-10-CM | POA: Diagnosis not present

## 2019-10-11 DIAGNOSIS — J449 Chronic obstructive pulmonary disease, unspecified: Secondary | ICD-10-CM | POA: Diagnosis not present

## 2019-10-12 ENCOUNTER — Other Ambulatory Visit: Payer: Self-pay | Admitting: Family Medicine

## 2019-10-12 ENCOUNTER — Other Ambulatory Visit: Payer: Self-pay | Admitting: Internal Medicine

## 2019-10-12 DIAGNOSIS — F411 Generalized anxiety disorder: Secondary | ICD-10-CM

## 2019-10-12 DIAGNOSIS — G629 Polyneuropathy, unspecified: Secondary | ICD-10-CM

## 2019-10-12 DIAGNOSIS — J302 Other seasonal allergic rhinitis: Secondary | ICD-10-CM

## 2019-10-14 ENCOUNTER — Telehealth: Payer: Self-pay | Admitting: Internal Medicine

## 2019-10-14 DIAGNOSIS — G629 Polyneuropathy, unspecified: Secondary | ICD-10-CM

## 2019-10-14 MED ORDER — GABAPENTIN 400 MG PO CAPS: 400.0000 mg | ORAL_CAPSULE | Freq: Two times a day (BID) | ORAL | 3 refills | Status: DC

## 2019-10-14 NOTE — Telephone Encounter (Signed)
Refill request for xanax. Please advise. Thanks!  

## 2019-10-16 ENCOUNTER — Other Ambulatory Visit: Payer: Self-pay | Admitting: Internal Medicine

## 2019-10-16 DIAGNOSIS — F411 Generalized anxiety disorder: Secondary | ICD-10-CM

## 2019-10-16 MED ORDER — ALPRAZOLAM 0.25 MG PO TABS: ORAL_TABLET | ORAL | 0 refills | Status: DC

## 2019-10-16 NOTE — Telephone Encounter (Signed)
Refilled

## 2019-11-10 DIAGNOSIS — J9611 Chronic respiratory failure with hypoxia: Secondary | ICD-10-CM | POA: Diagnosis not present

## 2019-11-10 DIAGNOSIS — J449 Chronic obstructive pulmonary disease, unspecified: Secondary | ICD-10-CM | POA: Diagnosis not present

## 2019-11-12 ENCOUNTER — Other Ambulatory Visit: Payer: Self-pay | Admitting: Internal Medicine

## 2019-11-12 ENCOUNTER — Other Ambulatory Visit: Payer: Self-pay | Admitting: Family Medicine

## 2019-11-12 DIAGNOSIS — F411 Generalized anxiety disorder: Secondary | ICD-10-CM

## 2019-11-12 DIAGNOSIS — J302 Other seasonal allergic rhinitis: Secondary | ICD-10-CM

## 2019-11-16 ENCOUNTER — Other Ambulatory Visit: Payer: Self-pay | Admitting: Family Medicine

## 2019-11-16 ENCOUNTER — Telehealth: Payer: Self-pay | Admitting: Family Medicine

## 2019-11-16 DIAGNOSIS — F411 Generalized anxiety disorder: Secondary | ICD-10-CM

## 2019-11-16 MED ORDER — ALPRAZOLAM 0.25 MG PO TABS: ORAL_TABLET | ORAL | 0 refills | Status: DC

## 2019-11-16 NOTE — Telephone Encounter (Signed)
Refill request for xanax. Please advise.  

## 2019-11-16 NOTE — Progress Notes (Signed)
Meds ordered this encounter  Medications  . ALPRAZolam (XANAX) 0.25 MG tablet    Sig: TAKE 1/2 (ONE-HALF) TABLET BY MOUTH THREE TIMES DAILY AS NEEDED FOR ANXIETY (WEANING  DOWN)    Dispense:  45 tablet    Refill:  0    Order Specific Question:   Supervising Provider    Answer:   Tresa Garter [[id number]]    Donia Pounds  APRN, MSN, FNP-C Patient [place] [facility] [address] [place], [place] [address] [phone]

## 2019-11-30 IMAGING — DX DG CHEST 2V
2 series · 2 of 2 positions shown · non-contrast
Comparison: 12/25/2018.  10/29/2017.

CLINICAL DATA: COPD.  Shortness of breath.

EXAM:
CHEST - 2 VIEW

[chest pa]
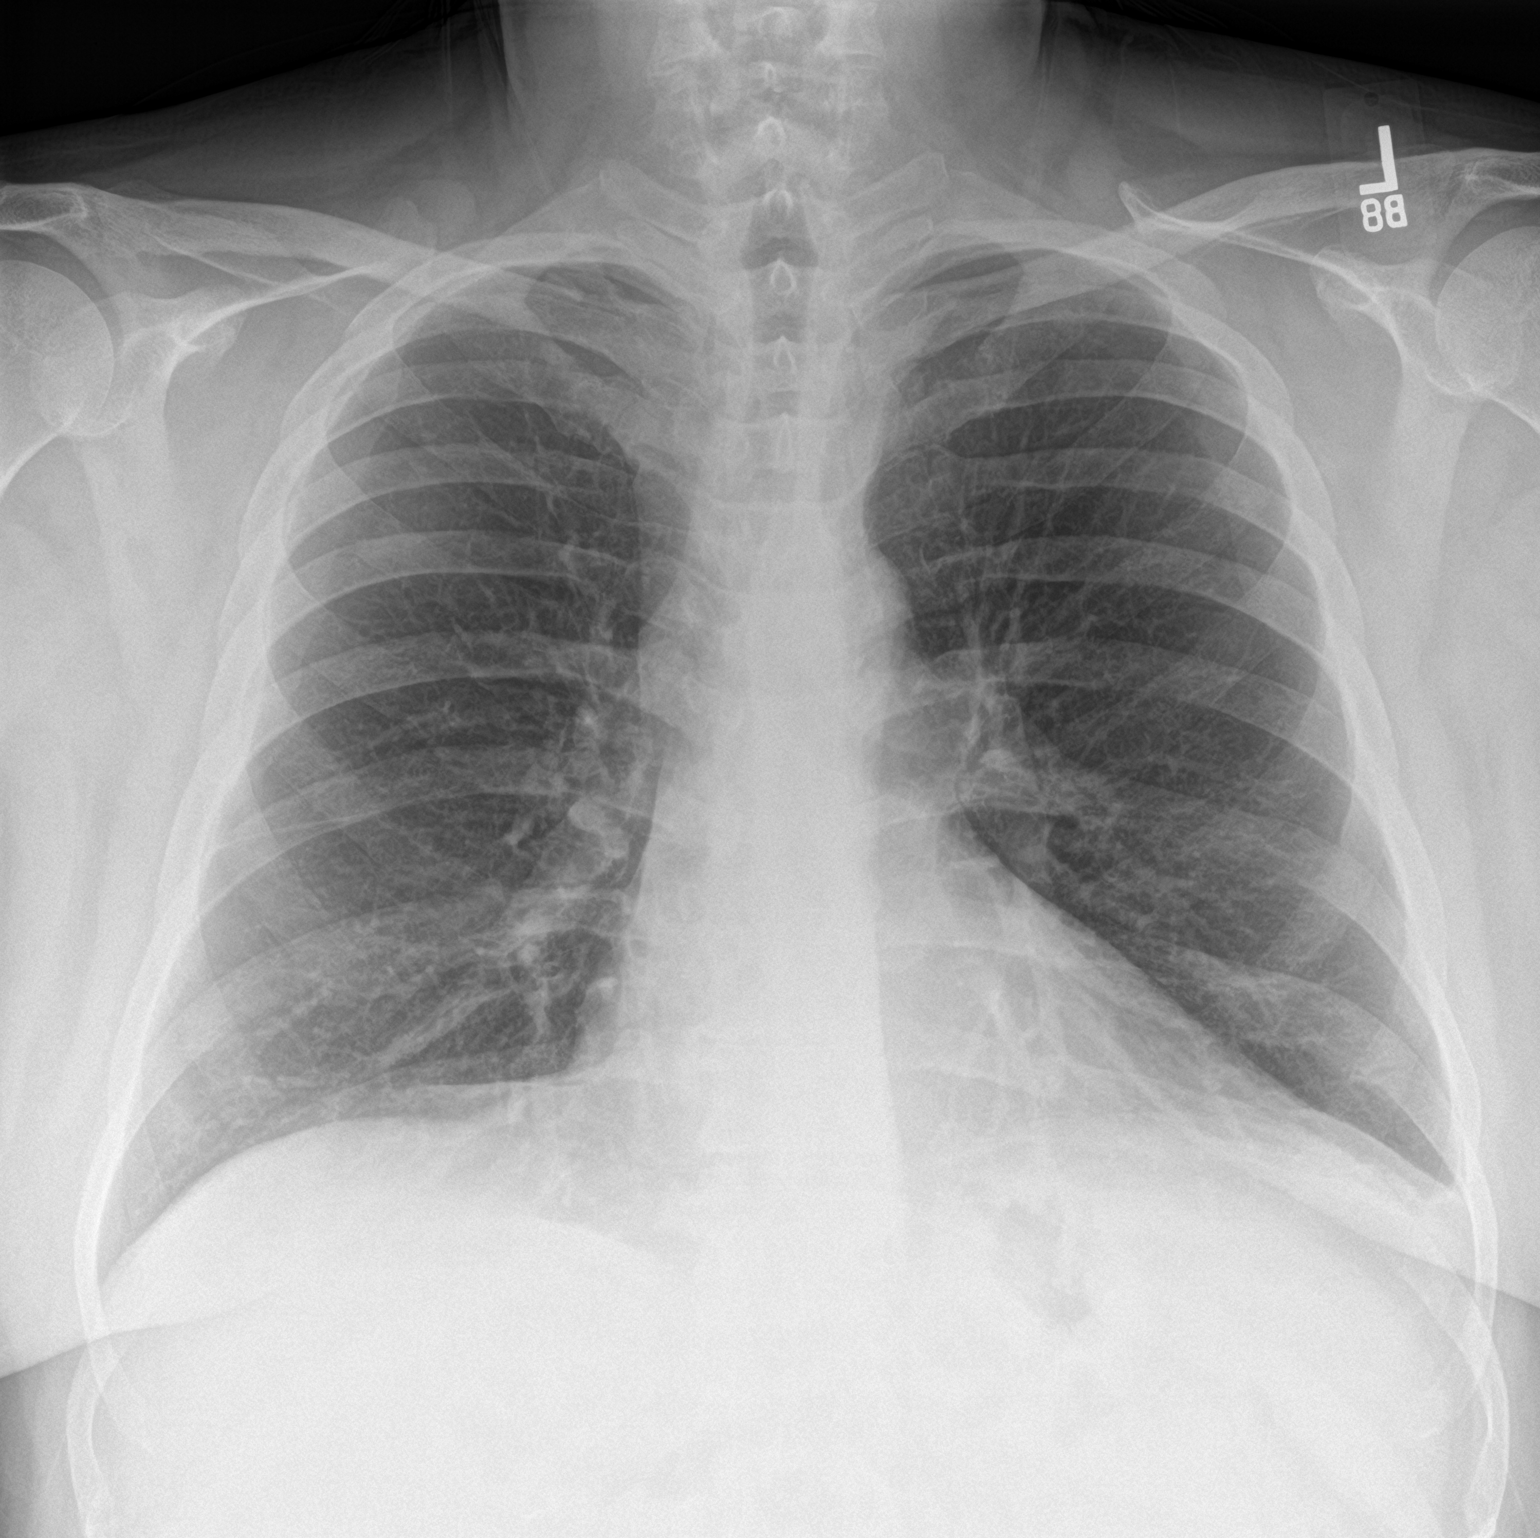

[chest lat]
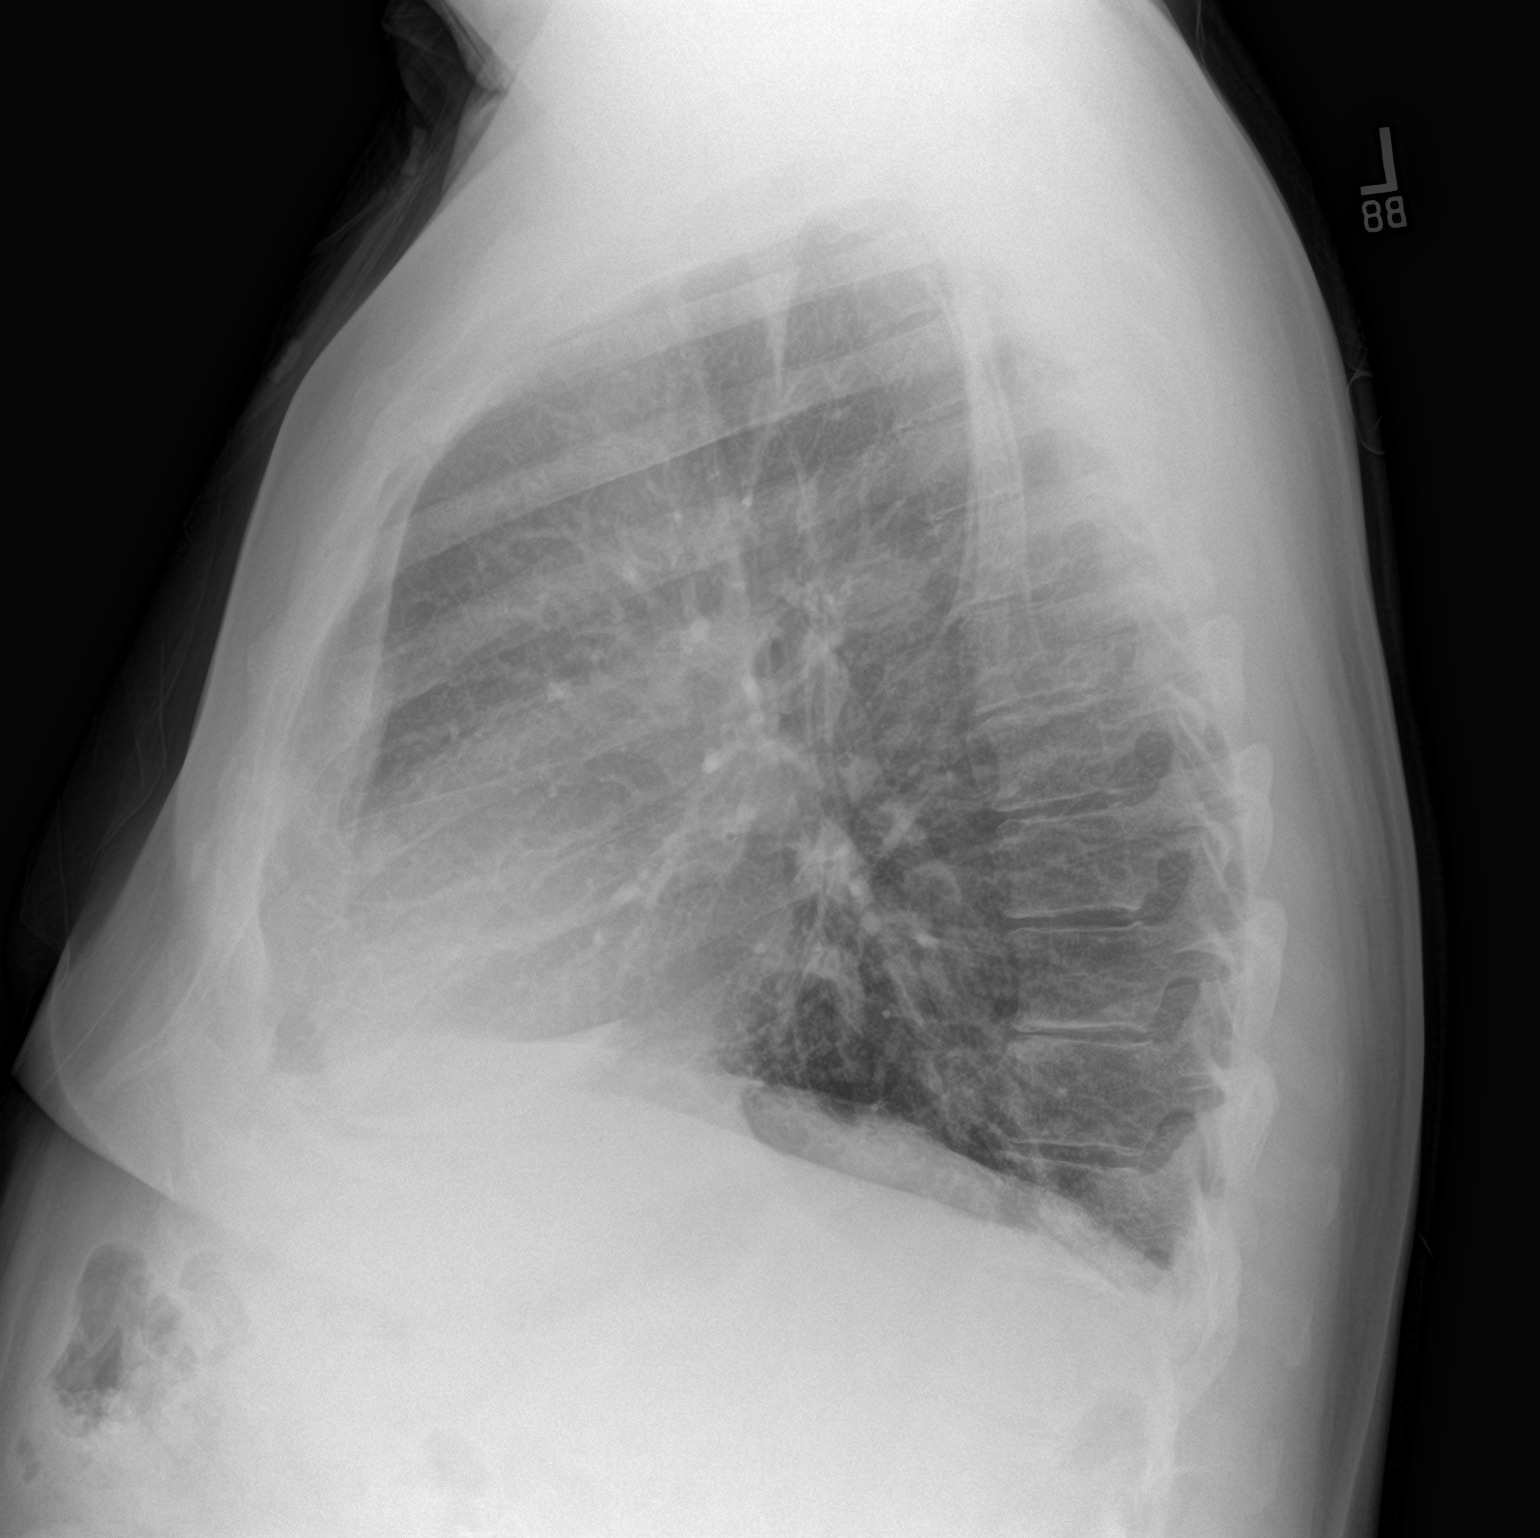

[2 of 2 positions shown; findings below may reference images not displayed]

FINDINGS: Mediastinum and hilar structures normal. Partial clearing of mild
bibasilar subsegmental atelectasis. A component of scarring may be
present. No scratched it tiny left pleural effusion can not be
excluded. No pneumothorax. Heart size stable. No acute bony
abnormality.
IMPRESSION: Partial clearing of mild bibasilar subsegmental atelectasis. A
component of scarring may be present.

## 2019-12-11 DIAGNOSIS — J449 Chronic obstructive pulmonary disease, unspecified: Secondary | ICD-10-CM | POA: Diagnosis not present

## 2019-12-11 DIAGNOSIS — J9611 Chronic respiratory failure with hypoxia: Secondary | ICD-10-CM | POA: Diagnosis not present

## 2019-12-16 ENCOUNTER — Other Ambulatory Visit: Payer: Self-pay | Admitting: Family Medicine

## 2019-12-16 DIAGNOSIS — J302 Other seasonal allergic rhinitis: Secondary | ICD-10-CM

## 2019-12-16 DIAGNOSIS — F411 Generalized anxiety disorder: Secondary | ICD-10-CM

## 2019-12-16 MED ORDER — ALPRAZOLAM 0.25 MG PO TABS: ORAL_TABLET | ORAL | 0 refills | Status: DC

## 2019-12-16 NOTE — Progress Notes (Signed)
Reviewed PDMP substance reporting system prior to prescribing opiate medications. No inconsistencies noted.   Meds ordered this encounter  Medications  . ALPRAZolam (XANAX) 0.25 MG tablet    Sig: TAKE 1/2 (ONE-HALF) TABLET BY MOUTH THREE TIMES DAILY AS NEEDED FOR ANXIETY (WEANING  DOWN)    Dispense:  30 tablet    Refill:  0    Order Specific Question:   Supervising Provider    Answer:   Quentin Angst [[id number]]     Nolon Nations  APRN, MSN, FNP-C [facility] [address] [place], [place] [address] [phone]

## 2019-12-16 NOTE — Telephone Encounter (Signed)
Refill request for xanax. Please advise.  

## 2020-01-11 DIAGNOSIS — J449 Chronic obstructive pulmonary disease, unspecified: Secondary | ICD-10-CM | POA: Diagnosis not present

## 2020-01-11 DIAGNOSIS — J9611 Chronic respiratory failure with hypoxia: Secondary | ICD-10-CM | POA: Diagnosis not present

## 2020-01-13 ENCOUNTER — Ambulatory Visit (INDEPENDENT_AMBULATORY_CARE_PROVIDER_SITE_OTHER): Payer: Medicaid Other | Admitting: Nurse Practitioner

## 2020-01-13 ENCOUNTER — Other Ambulatory Visit: Payer: Self-pay

## 2020-01-13 ENCOUNTER — Encounter: Payer: Self-pay | Admitting: Nurse Practitioner

## 2020-01-13 ENCOUNTER — Other Ambulatory Visit: Payer: Self-pay | Admitting: Family Medicine

## 2020-01-13 VITALS — BP 132/66 | HR 79 | Temp 98.0°F | Resp 16 | Ht 70.0 in | Wt 246.0 lb

## 2020-01-13 DIAGNOSIS — N451 Epididymitis: Secondary | ICD-10-CM | POA: Diagnosis not present

## 2020-01-13 DIAGNOSIS — F411 Generalized anxiety disorder: Secondary | ICD-10-CM

## 2020-01-13 DIAGNOSIS — I5032 Chronic diastolic (congestive) heart failure: Secondary | ICD-10-CM

## 2020-01-13 DIAGNOSIS — R0989 Other specified symptoms and signs involving the circulatory and respiratory systems: Secondary | ICD-10-CM

## 2020-01-13 DIAGNOSIS — J9612 Chronic respiratory failure with hypercapnia: Secondary | ICD-10-CM | POA: Diagnosis not present

## 2020-01-13 DIAGNOSIS — I10 Essential (primary) hypertension: Secondary | ICD-10-CM

## 2020-01-13 DIAGNOSIS — J9611 Chronic respiratory failure with hypoxia: Secondary | ICD-10-CM

## 2020-01-13 DIAGNOSIS — R7303 Prediabetes: Secondary | ICD-10-CM

## 2020-01-13 DIAGNOSIS — J302 Other seasonal allergic rhinitis: Secondary | ICD-10-CM

## 2020-01-13 DIAGNOSIS — J449 Chronic obstructive pulmonary disease, unspecified: Secondary | ICD-10-CM | POA: Diagnosis not present

## 2020-01-13 LAB — POCT GLYCOSYLATED HEMOGLOBIN (HGB A1C): Hemoglobin A1C: 5.4 % (ref 4.0–5.6)

## 2020-01-13 LAB — POCT URINALYSIS DIPSTICK
Bilirubin, UA: NEGATIVE
Blood, UA: NEGATIVE
Glucose, UA: NEGATIVE
Ketones, UA: NEGATIVE
Leukocytes, UA: NEGATIVE
Nitrite, UA: NEGATIVE
Protein, UA: NEGATIVE
Spec Grav, UA: 1.025 (ref 1.010–1.025)
Urobilinogen, UA: 0.2 E.U./dL
pH, UA: 6.5 (ref 5.0–8.0)

## 2020-01-13 MED ORDER — NAPROXEN 500 MG PO TABS: ORAL_TABLET | ORAL | 3 refills | Status: DC

## 2020-01-13 MED ORDER — ALPRAZOLAM 0.25 MG PO TABS: ORAL_TABLET | ORAL | 0 refills | Status: DC

## 2020-01-13 NOTE — Progress Notes (Signed)
Established Patient Office Visit  Subjective:  Patient ID: Kenneth Casey, male    DOB: 05/15/1956  Age: 64 y.o. MRN: [id number]  CC:  Chief Complaint  Patient presents with  . Anxiety  . COPD    HPI Kenneth Casey presents for follow-up.  He has a history of COPD, anxiety, depression and hypertension.  He has been having chest congestion.  He is using Mucinex which seems to be effective.  He has a significant history of COPD exacerbations on oxygen therapy.  He only uses the oxygen oxygen at night. He admits that he did not feel the congestion was serious enough to come in.  He admits that the mucus start out range but has improved to clear.  He denies fever chills, chest pains.  He has been eating and drinking without difficulty. Chest congestion using mucinex  He does continue to follow up with cardiology.  Is currently on carvedilol 6.2 mg furosemide 40 mg.  Again he denies any chest pains or dyspnea on exertion.  He does continue to use alprazolam 0.25 mg 3 times daily.  He admits that he is having to use this because of the pandemic and not been able to get out as feeling like.  He denies any change in his mood.  Past Medical History:  Diagnosis Date  . COPD (chronic obstructive pulmonary disease) (HCC)   . Obese   . Pneumonia     Past Surgical History:  Procedure Laterality Date  . CHOLECYSTECTOMY      Family History  Problem Relation Age of Onset  . Cancer Mother        breast cancer, lung cancer  . Diabetes Father   . Cancer Maternal Aunt   . Heart disease Paternal Uncle     Social History   Socioeconomic History  . Marital status: Married    Spouse name: Not on file  . Number of children: Not on file  . Years of education: Not on file  . Highest education level: Not on file  Occupational History  . Not on file  Tobacco Use  . Smoking status: Former Smoker    Packs/day: 2.00    Years: 30.00    Pack years: 60.00    Types: Cigarettes    Quit date:  01/08/2011    Years since quitting: 9.0  . Smokeless tobacco: Never Used  Substance and Sexual Activity  . Alcohol use: No    Alcohol/week: 0.0 standard drinks  . Drug use: No  . Sexual activity: Not on file  Other Topics Concern  . Not on file  Social History Narrative  . Not on file   Social Determinants of Health   Financial Resource Strain:   . Difficulty of Paying Living Expenses: Not on file  Food Insecurity:   . Worried About Programme researcher, broadcasting/film/video in the Last Year: Not on file  . Ran Out of Food in the Last Year: Not on file  Transportation Needs:   . Lack of Transportation (Medical): Not on file  . Lack of Transportation (Non-Medical): Not on file  Physical Activity:   . Days of Exercise per Week: Not on file  . Minutes of Exercise per Session: Not on file  Stress:   . Feeling of Stress : Not on file  Social Connections:   . Frequency of Communication with Friends and Family: Not on file  . Frequency of Social Gatherings with Friends and Family: Not on file  . Attends Religious Services:  Not on file  . Active Member of Clubs or Organizations: Not on file  . Attends Archivist Meetings: Not on file  . Marital Status: Not on file  Intimate Partner Violence:   . Fear of Current or Ex-Partner: Not on file  . Emotionally Abused: Not on file  . Physically Abused: Not on file  . Sexually Abused: Not on file    Outpatient Medications Prior to Visit  Medication Sig Dispense Refill  . acetaminophen (TYLENOL) 500 MG tablet Take 1,000 mg by mouth every 6 (six) hours as needed for moderate pain.    Marland Kitchen albuterol (PROVENTIL) (2.5 MG/3ML) 0.083% nebulizer solution USE 1 VIAL IN NEBULIZER EVERY 4 HOURS AS NEEDED FOR WHEEZING OR SHORTNESS OF BREATH 375 mL 1  . busPIRone (BUSPAR) 10 MG tablet TAKE 1 TABLET BY MOUTH TWICE DAILY 60 tablet 0  . carvedilol (COREG) 6.25 MG tablet TAKE 1 TABLET BY MOUTH TWICE DAILY WITH A MEAL 180 tablet 0  . dextromethorphan-guaiFENesin (MUCINEX  DM) 30-600 MG 12hr tablet Take 1 tablet by mouth 2 (two) times daily. 30 tablet 1  . gabapentin (NEURONTIN) 400 MG capsule Take 1 capsule (400 mg total) by mouth 2 (two) times daily. 60 capsule 3  . OXYGEN 2 with sleep and exertion    . PROVENTIL HFA 108 (90 Base) MCG/ACT inhaler INHALE 2 PUFFS BY MOUTH EVERY 6 HOURS AS NEEDED FOR WHEEZING FOR SHORTNESS OF BREATH 7 g 5  . SYMBICORT 160-4.5 MCG/ACT inhaler INHALE 2 PUFFS BY MOUTH FIRST THING IN THE MORNING AND THEN ANOTHER 2 PUFFS ABOUT 12 HOURS LATER 11 g 5  . tamsulosin (FLOMAX) 0.4 MG CAPS capsule Take 0.4 mg by mouth daily.  11  . ALPRAZolam (XANAX) 0.25 MG tablet TAKE 1/2 (ONE-HALF) TABLET BY MOUTH THREE TIMES DAILY AS NEEDED FOR ANXIETY (WEANING  DOWN) 30 tablet 0  . fluticasone (FLONASE) 50 MCG/ACT nasal spray USE 2 SPRAY(S) IN EACH NOSTRIL ONCE DAILY AS NEEDED FOR  ALLERGIES  OR  RHINITIS 16 g 0  . naproxen (NAPROSYN) 500 MG tablet TAKE 1 TABLET BY MOUTH TWICE DAILY WITH A MEAL 30 tablet 3  . furosemide (LASIX) 40 MG tablet TAKE 1 TABLET BY MOUTH ONCE DAILY (Patient not taking: Reported on 01/13/2020) 90 tablet 1  . triamcinolone ointment (KENALOG) 0.5 % APPLY  OINTMENT TOPICALLY TWICE DAILY (Patient not taking: Reported on 07/15/2019) 15 g 0   No facility-administered medications prior to visit.    Allergies  Allergen Reactions  . Adhesive [Tape] Rash    ROS Review of Systems  Constitutional: Negative.   HENT: Positive for congestion and postnasal drip.   Eyes: Negative.   Respiratory: Positive for shortness of breath.        Oxygen 2l at night not on   Cardiovascular: Negative.   Gastrointestinal: Negative.   Endocrine: Negative.   Genitourinary: Negative.   Musculoskeletal: Negative.   Skin: Negative.   Allergic/Immunologic: Negative.   Neurological: Negative.   Hematological: Negative.   Psychiatric/Behavioral: The patient is nervous/anxious.       Objective:    Physical Exam  Constitutional: He is oriented to  person, place, and time. He appears well-developed.  Cardiovascular: Normal rate.  Pulmonary/Chest: Effort normal.  Oxygen tank and nasal cannula  Coarse breath sounds greater on the right lower lobe with some rub and faint wheezing  Abdominal: Soft. Bowel sounds are normal.  Obese  Musculoskeletal:        General: Normal range of motion.  Cervical back: Normal range of motion.  Neurological: He is alert and oriented to person, place, and time.  Skin: Skin is warm and dry.  Psychiatric: He has a normal mood and affect. His behavior is normal. Judgment and thought content normal.    BP 132/66 (BP Location: Right Arm, Patient Position: Sitting, Cuff Size: Normal)   Pulse 79   Temp 98 F (36.7 C) (Oral)   Resp 16   Ht 5\' 10"  (1.778 m)   Wt 246 lb (111.6 kg)   SpO2 97%   BMI 35.30 kg/m  Wt Readings from Last 3 Encounters:  01/13/20 246 lb (111.6 kg)  07/15/19 232 lb (105.2 kg)  07/15/19 232 lb (105.2 kg)     Health Maintenance Due  Topic Date Due  . FOOT EXAM  11/12/1968  . OPHTHALMOLOGY EXAM  11/12/1968  . COLONOSCOPY  11/12/2008  . URINE MICROALBUMIN  01/15/2020    There are no preventive care reminders to display for this patient.  Lab Results  Component Value Date   TSH 2.564 12/08/2015   Lab Results  Component Value Date   WBC 12.0 (H) 01/13/2020   HGB 16.1 01/13/2020   HCT 47.9 01/13/2020   MCV 85 01/13/2020   PLT 302 01/13/2020   Lab Results  Component Value Date   NA 139 01/14/2019   K 4.2 01/14/2019   CO2 24 01/14/2019   GLUCOSE 85 01/14/2019   BUN 22 01/14/2019   CREATININE 0.81 01/14/2019   BILITOT <0.2 01/14/2019   ALKPHOS 82 01/14/2019   AST 15 01/14/2019   ALT 16 01/14/2019   PROT 6.1 01/14/2019   ALBUMIN 3.8 01/14/2019   CALCIUM 9.3 01/14/2019   ANIONGAP 7 12/14/2015   Lab Results  Component Value Date   CHOL 168 09/03/2016   Lab Results  Component Value Date   HDL 24 (L) 09/03/2016   Lab Results  Component Value Date    LDLCALC 115 09/03/2016   Lab Results  Component Value Date   TRIG 143 09/03/2016   Lab Results  Component Value Date   CHOLHDL 7.0 (H) 09/03/2016   Lab Results  Component Value Date   HGBA1C 5.4 01/13/2020      Assessment & Plan:   Problem List Items Addressed This Visit      High   Chronic diastolic CHF (congestive heart failure) (HCC)   Chronic respiratory failure with hypoxia and hypercapnia (HCC)   COPD GOLD III (Chronic)   Morbid obesity due to excess calories (HCC)    Other Visit Diagnoses    Generalized anxiety disorder    -  Primary   Refill on Xanax   Relevant Medications   ALPRAZolam (XANAX) 0.25 MG tablet   Other Relevant Orders   Urinalysis Dipstick (Completed)   Essential hypertension       Relevant Orders   Urinalysis Dipstick (Completed)   Prediabetes       Relevant Orders   HgB A1c (Completed)   Epididymitis       Relevant Medications   naproxen (NAPROSYN) 500 MG tablet   Chest congestion       CBC pending   Relevant Orders   CBC with Differential/Platelet (Completed)      Meds ordered this encounter  Medications  . ALPRAZolam (XANAX) 0.25 MG tablet    Sig: TAKE 1/2 (ONE-HALF) TABLET BY MOUTH THREE TIMES DAILY AS NEEDED FOR ANXIETY (WEANING  DOWN)    Dispense:  45 tablet    Refill:  0  Order Specific Question:   Supervising Provider    Answer:   Quentin Angst [id number]  . naproxen (NAPROSYN) 500 MG tablet    Sig: TAKE 1 TABLET BY MOUTH TWICE DAILY WITH A MEAL    Dispense:  60 tablet    Refill:  3    Order Specific Question:   Supervising Provider    Answer:   Quentin Angst [[id number]]    Follow-up: Return in about 6 months (around 07/12/2020).    Barbette Merino, NP

## 2020-01-14 ENCOUNTER — Other Ambulatory Visit: Payer: Self-pay | Admitting: Nurse Practitioner

## 2020-01-14 ENCOUNTER — Telehealth: Payer: Self-pay

## 2020-01-14 DIAGNOSIS — J189 Pneumonia, unspecified organism: Secondary | ICD-10-CM

## 2020-01-14 LAB — CBC WITH DIFFERENTIAL/PLATELET
Basophils Absolute: 0.1 10*3/uL (ref 0.0–0.2)
Basos: 1 %
EOS (ABSOLUTE): 0.6 10*3/uL — ABNORMAL HIGH (ref 0.0–0.4)
Eos: 5 %
Hematocrit: 47.9 % (ref 37.5–51.0)
Hemoglobin: 16.1 g/dL (ref 13.0–17.7)
Immature Grans (Abs): 0 10*3/uL (ref 0.0–0.1)
Immature Granulocytes: 0 %
Lymphocytes Absolute: 2.1 10*3/uL (ref 0.7–3.1)
Lymphs: 18 %
MCH: 28.7 pg (ref 26.6–33.0)
MCHC: 33.6 g/dL (ref 31.5–35.7)
MCV: 85 fL (ref 79–97)
Monocytes Absolute: 0.9 10*3/uL (ref 0.1–0.9)
Monocytes: 7 %
Neutrophils Absolute: 8.3 10*3/uL — ABNORMAL HIGH (ref 1.4–7.0)
Neutrophils: 69 %
Platelets: 302 10*3/uL (ref 150–450)
RBC: 5.61 x10E6/uL (ref 4.14–5.80)
RDW: 13 % (ref 11.6–15.4)
WBC: 12 10*3/uL — ABNORMAL HIGH (ref 3.4–10.8)

## 2020-01-14 MED ORDER — DOXYCYCLINE HYCLATE 100 MG PO CAPS: 100.0000 mg | ORAL_CAPSULE | Freq: Two times a day (BID) | ORAL | 0 refills | Status: AC

## 2020-01-14 NOTE — Telephone Encounter (Signed)
I am no longer this patient's PCP. Please send to Crystal King, NP. Thanks

## 2020-01-14 NOTE — Telephone Encounter (Signed)
-----   Message from Barbette Merino, NP sent at 01/14/2020  9:39 AM EST ----- I saw this patient on yesterday.  He has an extensive lung history with COPD.  I want to treat his elevated white count for questionable pneumonia.  However I need to see if there is an antibiotic that he does not tolerate before sending into his pharmacy.  Thanks I have sent a message to Vernona Rieger and forgot she was not in

## 2020-01-14 NOTE — Telephone Encounter (Signed)
Kenneth Casey stated he has never had a problem with antibiotics.

## 2020-02-08 DIAGNOSIS — J449 Chronic obstructive pulmonary disease, unspecified: Secondary | ICD-10-CM | POA: Diagnosis not present

## 2020-02-08 DIAGNOSIS — J9611 Chronic respiratory failure with hypoxia: Secondary | ICD-10-CM | POA: Diagnosis not present

## 2020-02-10 ENCOUNTER — Other Ambulatory Visit: Payer: Self-pay | Admitting: Internal Medicine

## 2020-02-10 ENCOUNTER — Other Ambulatory Visit: Payer: Self-pay | Admitting: Family Medicine

## 2020-02-10 ENCOUNTER — Other Ambulatory Visit: Payer: Self-pay | Admitting: Nurse Practitioner

## 2020-02-10 DIAGNOSIS — F411 Generalized anxiety disorder: Secondary | ICD-10-CM

## 2020-02-10 DIAGNOSIS — J302 Other seasonal allergic rhinitis: Secondary | ICD-10-CM

## 2020-02-10 DIAGNOSIS — G629 Polyneuropathy, unspecified: Secondary | ICD-10-CM

## 2020-02-11 NOTE — Telephone Encounter (Signed)
Called, no answer. Left a message to call back. Thanks!  

## 2020-02-11 NOTE — Telephone Encounter (Signed)
Refill request for xanax. Please advise.  

## 2020-02-11 NOTE — Telephone Encounter (Signed)
Patient called back. He states he take 1 tablet a day at night sometimes he wakes up in the middle of the night and has to take another.

## 2020-02-11 NOTE — Telephone Encounter (Signed)
Please verify how much he is currently using.  He is supposed to be weaning down. Thanks

## 2020-02-12 ENCOUNTER — Telehealth: Payer: Self-pay | Admitting: Nurse Practitioner

## 2020-02-12 NOTE — Telephone Encounter (Signed)
Did you speak with the patient?

## 2020-02-12 NOTE — Telephone Encounter (Signed)
Called and spoke with patient, He did not receive his medications at the pharmacy for xanax and gabapentin. I advised gabapentin was sent in but looks like it didn't go through until 8:14pm which is after he was at the pharmacy. I also advised I will ask provider about the xanax. Thanks!

## 2020-02-12 NOTE — Telephone Encounter (Signed)
He is taking 1 at night and sometimes wakes up in the middle of the night and has to take another tablet.

## 2020-03-10 ENCOUNTER — Other Ambulatory Visit: Payer: Self-pay | Admitting: Internal Medicine

## 2020-03-10 ENCOUNTER — Other Ambulatory Visit: Payer: Self-pay | Admitting: Family Medicine

## 2020-03-10 ENCOUNTER — Other Ambulatory Visit: Payer: Self-pay | Admitting: Nurse Practitioner

## 2020-03-10 DIAGNOSIS — I10 Essential (primary) hypertension: Secondary | ICD-10-CM

## 2020-03-10 DIAGNOSIS — J302 Other seasonal allergic rhinitis: Secondary | ICD-10-CM

## 2020-03-10 DIAGNOSIS — G629 Polyneuropathy, unspecified: Secondary | ICD-10-CM

## 2020-03-10 DIAGNOSIS — F411 Generalized anxiety disorder: Secondary | ICD-10-CM

## 2020-03-10 DIAGNOSIS — J449 Chronic obstructive pulmonary disease, unspecified: Secondary | ICD-10-CM | POA: Diagnosis not present

## 2020-03-10 DIAGNOSIS — J9611 Chronic respiratory failure with hypoxia: Secondary | ICD-10-CM | POA: Diagnosis not present

## 2020-03-10 MED ORDER — GABAPENTIN 400 MG PO CAPS: 400.0000 mg | ORAL_CAPSULE | Freq: Two times a day (BID) | ORAL | 2 refills | Status: DC

## 2020-03-10 MED ORDER — ALPRAZOLAM 0.25 MG PO TABS: ORAL_TABLET | ORAL | 0 refills | Status: DC

## 2020-03-10 NOTE — Telephone Encounter (Signed)
Refill request for xanax and gabapentin. I can not approve the gabapentin because they were sent over together. Please advise if this can be refilled? Thanks!

## 2020-04-09 DIAGNOSIS — J9611 Chronic respiratory failure with hypoxia: Secondary | ICD-10-CM | POA: Diagnosis not present

## 2020-04-09 DIAGNOSIS — J449 Chronic obstructive pulmonary disease, unspecified: Secondary | ICD-10-CM | POA: Diagnosis not present

## 2020-04-11 ENCOUNTER — Other Ambulatory Visit: Payer: Self-pay | Admitting: Nurse Practitioner

## 2020-04-11 ENCOUNTER — Other Ambulatory Visit: Payer: Self-pay | Admitting: Internal Medicine

## 2020-04-11 DIAGNOSIS — F411 Generalized anxiety disorder: Secondary | ICD-10-CM

## 2020-04-11 DIAGNOSIS — J302 Other seasonal allergic rhinitis: Secondary | ICD-10-CM

## 2020-04-11 NOTE — Telephone Encounter (Signed)
Patient requesting Xanax refill. 

## 2020-04-15 ENCOUNTER — Telehealth: Payer: Self-pay | Admitting: Nurse Practitioner

## 2020-04-15 ENCOUNTER — Other Ambulatory Visit: Payer: Self-pay

## 2020-04-15 DIAGNOSIS — J302 Other seasonal allergic rhinitis: Secondary | ICD-10-CM

## 2020-04-15 MED ORDER — FLUTICASONE PROPIONATE 50 MCG/ACT NA SUSP: NASAL | 1 refills | Status: DC

## 2020-04-15 NOTE — Telephone Encounter (Signed)
Pt called having trouble getting Flonase at the pharmacy. Please follow up with pt.

## 2020-04-15 NOTE — Telephone Encounter (Signed)
Called lvm to inform patient that his rx is at the pharmacy .

## 2020-05-10 DIAGNOSIS — J449 Chronic obstructive pulmonary disease, unspecified: Secondary | ICD-10-CM | POA: Diagnosis not present

## 2020-05-10 DIAGNOSIS — J9611 Chronic respiratory failure with hypoxia: Secondary | ICD-10-CM | POA: Diagnosis not present

## 2020-05-11 ENCOUNTER — Other Ambulatory Visit: Payer: Self-pay | Admitting: Nurse Practitioner

## 2020-05-11 DIAGNOSIS — F411 Generalized anxiety disorder: Secondary | ICD-10-CM

## 2020-05-11 DIAGNOSIS — N451 Epididymitis: Secondary | ICD-10-CM

## 2020-05-12 ENCOUNTER — Telehealth: Payer: Self-pay | Admitting: Nurse Practitioner

## 2020-05-12 ENCOUNTER — Other Ambulatory Visit: Payer: Self-pay | Admitting: Nurse Practitioner

## 2020-05-12 DIAGNOSIS — F411 Generalized anxiety disorder: Secondary | ICD-10-CM

## 2020-05-12 DIAGNOSIS — N451 Epididymitis: Secondary | ICD-10-CM

## 2020-05-12 NOTE — Progress Notes (Signed)
   [facility] [address] [place], [place]  [address] Phone:  [phone]   Fax:  [phone] 

## 2020-05-12 NOTE — Telephone Encounter (Signed)
Please make him aware that he has to be seen at least every 2 months in order to get refills on a controlled substance. I will refill it this time but he needs to make a follow up for anxiety/sleep within the next week.  Thanks  He can follow up on general conditions q 6 months but not when you are getting controls  Thanks and sorry because he was suppose to be weaning off.

## 2020-05-12 NOTE — Telephone Encounter (Signed)
Called and advised patient of this.

## 2020-05-12 NOTE — Telephone Encounter (Signed)
Pt called in refill on xanax and naproxen

## 2020-05-13 ENCOUNTER — Other Ambulatory Visit: Payer: Self-pay

## 2020-05-13 DIAGNOSIS — F411 Generalized anxiety disorder: Secondary | ICD-10-CM

## 2020-05-13 DIAGNOSIS — N451 Epididymitis: Secondary | ICD-10-CM

## 2020-05-14 MED ORDER — ALPRAZOLAM 0.25 MG PO TABS: ORAL_TABLET | ORAL | 0 refills | Status: DC

## 2020-05-14 MED ORDER — NAPROXEN 500 MG PO TABS: ORAL_TABLET | ORAL | 3 refills | Status: DC

## 2020-05-16 ENCOUNTER — Other Ambulatory Visit: Payer: Self-pay | Admitting: Nurse Practitioner

## 2020-05-16 DIAGNOSIS — N451 Epididymitis: Secondary | ICD-10-CM

## 2020-05-16 DIAGNOSIS — F411 Generalized anxiety disorder: Secondary | ICD-10-CM

## 2020-05-17 ENCOUNTER — Other Ambulatory Visit: Payer: Self-pay

## 2020-05-17 ENCOUNTER — Telehealth: Payer: Self-pay | Admitting: Nurse Practitioner

## 2020-05-17 DIAGNOSIS — F411 Generalized anxiety disorder: Secondary | ICD-10-CM

## 2020-05-17 DIAGNOSIS — N451 Epididymitis: Secondary | ICD-10-CM

## 2020-05-17 MED ORDER — NAPROXEN 500 MG PO TABS: ORAL_TABLET | ORAL | 3 refills | Status: DC

## 2020-05-17 NOTE — Telephone Encounter (Signed)
Pt medication did go through on the 05/14/20 can , have a refill I spoke with [facility] to [facility]frim and they didn't receive this

## 2020-05-17 NOTE — Telephone Encounter (Signed)
Called and spoke with [facility] pharmacy, they didn't receive this medication on the 05/14/2020, resent medication Natlie NP co signed meds.

## 2020-05-17 NOTE — Telephone Encounter (Signed)
Please resend Naproxen and xanax because it was sent but it said the transmission failed. Please fill asap.

## 2020-05-20 MED ORDER — ALPRAZOLAM 0.25 MG PO TABS: ORAL_TABLET | ORAL | 0 refills | Status: DC

## 2020-05-20 MED ORDER — NAPROXEN 500 MG PO TABS: ORAL_TABLET | ORAL | 3 refills | Status: DC

## 2020-05-23 ENCOUNTER — Other Ambulatory Visit: Payer: Self-pay | Admitting: Nurse Practitioner

## 2020-05-23 DIAGNOSIS — F411 Generalized anxiety disorder: Secondary | ICD-10-CM

## 2020-06-01 ENCOUNTER — Other Ambulatory Visit: Payer: Self-pay

## 2020-06-01 ENCOUNTER — Ambulatory Visit (INDEPENDENT_AMBULATORY_CARE_PROVIDER_SITE_OTHER): Payer: Medicaid Other | Admitting: Nurse Practitioner

## 2020-06-01 ENCOUNTER — Encounter: Payer: Self-pay | Admitting: Nurse Practitioner

## 2020-06-01 VITALS — BP 166/90 | HR 74 | Ht 69.0 in | Wt 245.0 lb

## 2020-06-01 DIAGNOSIS — J069 Acute upper respiratory infection, unspecified: Secondary | ICD-10-CM

## 2020-06-01 DIAGNOSIS — J209 Acute bronchitis, unspecified: Secondary | ICD-10-CM | POA: Diagnosis not present

## 2020-06-01 DIAGNOSIS — J440 Chronic obstructive pulmonary disease with acute lower respiratory infection: Secondary | ICD-10-CM | POA: Diagnosis not present

## 2020-06-01 MED ORDER — AMOXICILLIN-POT CLAVULANATE 875-125 MG PO TABS: 1.0000 | ORAL_TABLET | Freq: Two times a day (BID) | ORAL | 0 refills | Status: AC

## 2020-06-01 NOTE — Progress Notes (Addendum)
   [facility] Patient [facility] [address] Kenneth Casey [place], [place]  [address] Phone:  [phone]   Fax:  [phone] Virtual Visit via Telephone Note  I connected with Kenneth Casey on 06/03/20 at  2:50 PM EDT by telephone and verified that I am speaking with the correct person using two identifiers.   I discussed the limitations, risks, security and privacy concerns of performing an evaluation and management service by telephone and the availability of in person appointments. I also discussed with the patient that there may be a patient responsible charge related to this service. The patient expressed understanding and agreed to proceed.  Patient : home Provider : office   History of Present Illness:  Cough Patient complains of productive cough. Symptoms began 3 days ago. Symptoms have been unchanged since that time.The cough is productive of brown sputum and is aggravated by nothing. Associated symptoms include: change in voice and shortness of breath. Patient has not had a previous chest x-ray. He had not had any tx due to his chronic conditions.   Observations/Objective: Audible hoarseness sounds of nasal congestion during visit.  Patient able to speak in full sentences without shortness of breath  Assessment and Plan: rimary Diagnosis & Pertinent Problem List: The primary encounter diagnosis was Upper respiratory infection with cough and congestion. A diagnosis of COPD with acute bronchitis (HCC) was also pertinent to this visit.  Visit Diagnosis: 1. Upper respiratory infection with cough and congestion   2. COPD with acute bronchitis (HCC)     Follow Up Instructions: Encourage Mucinex DM along with antibiotic treatment if symptoms per breath and do not improve within the next 48 hours patient is instructed to proceed to the emergency room for further evaluation.  Plan of Care  Pharmacotherapy (Medications Ordered): Meds ordered this encounter  Medications  .  amoxicillin-clavulanate (AUGMENTIN) 875-125 MG tablet    Sig: Take 1 tablet by mouth every 12 (twelve) hours for 10 days.    Dispense:  20 tablet    Refill:  0    Order Specific Question:   Supervising Provider    Answer:   Quentin Angst [[id number]]   New Prescriptions   No medications on file   Medications administered today: Kenneth Casey had no medications administered during this visit. Lab-work, procedure(s), and/or referral(s): No orders of the defined types were placed in this encounter.  Future Appointments  Date Time Provider Department Center  07/14/2020  9:00 AM [name], MD [facility] None  07/14/2020 10:40 AM Barbette Merino, NP [facility] None    I discussed the assessment and treatment plan with the patient. The patient was provided an opportunity to ask questions and all were answered. The patient agreed with the plan and demonstrated an understanding of the instructions.   The patient was advised to call back or seek an in-person evaluation if the symptoms worsen or if the condition fails to improve as anticipated.  I provided 6 minutes of non-face-to-face time during this encounter.   Barbette Merino, NP

## 2020-06-09 DIAGNOSIS — J449 Chronic obstructive pulmonary disease, unspecified: Secondary | ICD-10-CM | POA: Diagnosis not present

## 2020-06-10 ENCOUNTER — Other Ambulatory Visit: Payer: Self-pay | Admitting: Nurse Practitioner

## 2020-06-10 DIAGNOSIS — J302 Other seasonal allergic rhinitis: Secondary | ICD-10-CM

## 2020-06-10 DIAGNOSIS — G629 Polyneuropathy, unspecified: Secondary | ICD-10-CM

## 2020-06-10 DIAGNOSIS — I10 Essential (primary) hypertension: Secondary | ICD-10-CM

## 2020-06-19 ENCOUNTER — Other Ambulatory Visit: Payer: Self-pay | Admitting: Nurse Practitioner

## 2020-06-19 DIAGNOSIS — F411 Generalized anxiety disorder: Secondary | ICD-10-CM

## 2020-06-20 NOTE — Telephone Encounter (Signed)
Please contact, thank you.

## 2020-06-22 ENCOUNTER — Other Ambulatory Visit: Payer: Self-pay | Admitting: Nurse Practitioner

## 2020-06-22 DIAGNOSIS — F411 Generalized anxiety disorder: Secondary | ICD-10-CM

## 2020-06-22 MED ORDER — ALPRAZOLAM 0.25 MG PO TABS: ORAL_TABLET | ORAL | 0 refills | Status: DC

## 2020-06-22 NOTE — Progress Notes (Unsigned)
   [facility] Patient [facility] [name] [place], [place]  [address] Phone:  [phone]   Fax:  [phone]  Patient was provided with at refill of Alprazolam 0.25mg  #qty 40 . He was to have a follow up apt to be advised that he has to continue with weaning off of Alprazolam.   A referral can be made for Psychiatry if he would wish to continue with the Alprazolam for daily use.

## 2020-07-10 DIAGNOSIS — J449 Chronic obstructive pulmonary disease, unspecified: Secondary | ICD-10-CM | POA: Diagnosis not present

## 2020-07-13 ENCOUNTER — Ambulatory Visit: Payer: Medicaid Other | Admitting: Nurse Practitioner

## 2020-07-14 ENCOUNTER — Encounter: Payer: Self-pay | Admitting: Nurse Practitioner

## 2020-07-14 ENCOUNTER — Ambulatory Visit (INDEPENDENT_AMBULATORY_CARE_PROVIDER_SITE_OTHER): Payer: Medicaid Other | Admitting: Nurse Practitioner

## 2020-07-14 ENCOUNTER — Other Ambulatory Visit: Payer: Self-pay

## 2020-07-14 ENCOUNTER — Encounter: Payer: Self-pay | Admitting: Internal Medicine

## 2020-07-14 ENCOUNTER — Ambulatory Visit: Payer: Medicaid Other | Admitting: Internal Medicine

## 2020-07-14 VITALS — BP 135/78 | HR 75 | Temp 98.3°F | Resp 16 | Ht 69.0 in | Wt 253.0 lb

## 2020-07-14 DIAGNOSIS — J9612 Chronic respiratory failure with hypercapnia: Secondary | ICD-10-CM

## 2020-07-14 DIAGNOSIS — J9611 Chronic respiratory failure with hypoxia: Secondary | ICD-10-CM

## 2020-07-14 DIAGNOSIS — J069 Acute upper respiratory infection, unspecified: Secondary | ICD-10-CM

## 2020-07-14 DIAGNOSIS — N50811 Right testicular pain: Secondary | ICD-10-CM | POA: Diagnosis not present

## 2020-07-14 DIAGNOSIS — I10 Essential (primary) hypertension: Secondary | ICD-10-CM | POA: Diagnosis not present

## 2020-07-14 DIAGNOSIS — G629 Polyneuropathy, unspecified: Secondary | ICD-10-CM | POA: Diagnosis not present

## 2020-07-14 DIAGNOSIS — R7303 Prediabetes: Secondary | ICD-10-CM | POA: Diagnosis not present

## 2020-07-14 DIAGNOSIS — F99 Mental disorder, not otherwise specified: Secondary | ICD-10-CM

## 2020-07-14 DIAGNOSIS — J449 Chronic obstructive pulmonary disease, unspecified: Secondary | ICD-10-CM | POA: Diagnosis not present

## 2020-07-14 DIAGNOSIS — F5105 Insomnia due to other mental disorder: Secondary | ICD-10-CM | POA: Diagnosis not present

## 2020-07-14 DIAGNOSIS — F411 Generalized anxiety disorder: Secondary | ICD-10-CM | POA: Diagnosis not present

## 2020-07-14 DIAGNOSIS — N451 Epididymitis: Secondary | ICD-10-CM

## 2020-07-14 MED ORDER — GABAPENTIN 400 MG PO CAPS: 400.0000 mg | ORAL_CAPSULE | Freq: Two times a day (BID) | ORAL | 3 refills | Status: DC

## 2020-07-14 MED ORDER — CARVEDILOL 6.25 MG PO TABS: 6.2500 mg | ORAL_TABLET | Freq: Two times a day (BID) | ORAL | 3 refills | Status: DC

## 2020-07-14 MED ORDER — BUSPIRONE HCL 10 MG PO TABS: 10.0000 mg | ORAL_TABLET | Freq: Two times a day (BID) | ORAL | 3 refills | Status: DC

## 2020-07-14 MED ORDER — NAPROXEN 500 MG PO TABS: ORAL_TABLET | ORAL | 3 refills | Status: DC

## 2020-07-14 MED ORDER — DM-GUAIFENESIN ER 30-600 MG PO TB12: 1.0000 | ORAL_TABLET | Freq: Two times a day (BID) | ORAL | 1 refills | Status: DC

## 2020-07-14 NOTE — Progress Notes (Signed)
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     History of Present Illness  12/21/2015 1st [facility] office visit/ Kenneth Casey   Chief Complaint  Patient presents with  . HFU    Pt states that his breathing has improved some, but not baseline for him. He c/o chest congestion and has had some cough with clear sputum. He has been wheezing some. He is using albuterol inhaler 4-5 x per day and neb 2-4 x per day.   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 Follow-up    Increased SOB since the last visit- relates to the humid weather. He states sometimes hears "gurgling in chest". He is using his albuterol inhaler 2-3 x per day and neb once per wk on average.   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 Follow-up    PFT today, SOB, productive cough, headache, dental issues, O2 with activity and sleep   Dyspnea:  20 min on 5lpm walking at park some hills Cough: some am / dark, thick  Sleeping: ok on 4lpm rarely wakes up with HA  SABA use: 4-5 x per week and neb if out in heat  02: 4lpm hs and 5lpm ex   rec Plan A = Automatic = Symbicort 160 Take 2 puffs first thing in am and then another 2 puffs about 12 hours later.  Work on 09/13/2018:  Plan B = Backup Only use your albuterol as a rescue medication  Plan C = Crisis - only use your albuterol nebulizer if you first try Plan B   Daytime goal is to keep 02 sats above 90% at all times > adjust your 02 to get to that level Congratulations on weight loss  Please schedule a follow up visit in 3 months but call sooner if needed    10/20/2018  f/u ov/Kenneth Casey re:  Copd II /obesity  Chief Complaint  Patient presents with  . Follow-up    Breathing is overall doing well. He uses his albuterol inhaler 1 x daily on average. He rarely uses neb.   Dyspnea:  Walking 20 min s stopping on 5lpm never checks sats  Cough: none Sleeping: electric bed vs recliner restless SABA use: as above - uses q d before ex "just in case" 02: 2lpm (self titrated down)  rec Be sure to check your 02 saturation with walking to be sure it's staying above 90% - this is the best way to "burn fat"  No change on medications  Only use your albuterol as rescue       07/14/2020  f/u ov/Kenneth Casey re: copd II / obesity /maint symb 160 2bid / limited by L leg pain/ weak   Chief Complaint  Patient presents with  . Follow-up    Breathing is overall doing well. He states having some cough and congestion when it's humid out- brings up clear sputum in the am's. He is using  albuterol inhaler 2 x per day and uses neb maybe 3 x per month.   Dyspnea:  Limited by knee/hip numbness painful paresthesias  Cough: about the same/ assoc with pnds  Sleeping: recliner x 45 degees SABA use: twice daily saba p ex, never pre or rechallenges 02:  2lpm hs and prn    No obvious day to day or daytime variability or assoc excess/ purulent sputum or mucus plugs or hemoptysis or cp or chest tightness, subjective wheeze or overt sinus or hb symptoms.   Sleeping as abov e without nocturnal  or early am exacerbation  of respiratory  c/o's or need for noct saba. Also denies any obvious fluctuation of symptoms with weather or environmental changes or other aggravating or alleviating factors except as outlined above   No unusual exposure hx or h/o childhood pna/ asthma or knowledge of premature birth.  Current Allergies, Complete Past Medical History, Past Surgical History, Family History, and Social History were reviewed in [employer] record.  ROS  The following are not active complaints unless bolded Hoarseness, sore throat, dysphagia, dental problems, itching, sneezing,  nasal congestion or discharge of excess mucus or purulent secretions, ear ache,   fever, chills, sweats, unintended wt loss or wt gain, classically pleuritic or exertional cp,  orthopnea pnd or arm/hand swelling  or leg swelling, presyncope, palpitations, abdominal pain, anorexia, nausea, vomiting, diarrhea  or change in bowel habits or change in bladder habits, change in stools or change in urine, dysuria, hematuria,  rash, arthralgias, visual complaints, headache, numbness, weakness or ataxia or problems with walking or coordination,  change in mood or  memory.        Current Meds  Medication Sig  . acetaminophen (TYLENOL) 500 MG tablet Take 1,000 mg by mouth every 6 (six) hours as needed for moderate pain.  Marland Kitchen albuterol (PROVENTIL) (2.5 MG/3ML) 0.083% nebulizer solution USE 1 VIAL IN NEBULIZER  EVERY 4 HOURS AS NEEDED FOR WHEEZING OR SHORTNESS OF BREATH  . ALPRAZolam (XANAX) 0.25 MG tablet TAKE 1/2 (ONE-HALF) TABLET BY MOUTH  UP TO THREE TIMES DAILY AS NEEDED MUST LAST 30 DAYS  . busPIRone (BUSPAR) 10 MG tablet TAKE 1 TABLET BY MOUTH TWICE DAILY  . carvedilol (COREG) 6.25 MG tablet TAKE 1 TABLET BY MOUTH TWICE DAILY WITH  A MEAL  . dextromethorphan-guaiFENesin (MUCINEX DM) 30-600 MG 12hr tablet Take 1 tablet by mouth 2 (two) times daily.  . fluticasone (FLONASE) 50 MCG/ACT nasal spray USE 2 SPRAY(S) IN EACH NOSTRIL ONCE DAILY AS NEEDED FOR  ALLERGIES  . furosemide (LASIX) 40 MG tablet Take 40 mg by mouth daily as needed.  . gabapentin (NEURONTIN) 400 MG capsule Take 1 capsule by mouth twice daily  . naproxen (NAPROSYN) 500 MG tablet TAKE 1 TABLET BY MOUTH TWICE DAILY WITH A MEAL  . OXYGEN 2 with sleep and exertion  . PROVENTIL HFA 108 (90 Base) MCG/ACT inhaler INHALE 2 PUFFS BY MOUTH EVERY 6 HOURS AS NEEDED FOR WHEEZING OR SHORTNESS OF BREATH  . SYMBICORT 160-4.5 MCG/ACT inhaler INHALE 2 PUFFS BY MOUTH FIRST THING IN THE MORNING AND THEN ANOTHER 2 PUFFS ABOUT 12 HOURS LATER  . tamsulosin (FLOMAX) 0.4 MG CAPS capsule Take 0.4 mg by mouth daily.  Marland Kitchen triamcinolone ointment (KENALOG) 0.5 % APPLY  OINTMENT TOPICALLY TWICE DAILY              Objective:   Physical Exam  amb obese wm nad   07/14/2020      253  07/15/2019     232  04/16/2016          309 >  05/28/2016  305 > 09/03/2016   318 >  02/13/2017  323 >  08/13/2017  326 > 01/13/2018  328 > 04/14/2018  312 > 07/14/2018    290 > 10/20/2018  267 > 01/17/2016          311   12/20/15 308 lb (139.708 kg)  12/13/15 315 lb (142.883 kg)  12/08/15 312 lb (141.522 kg)      Vital signs reviewed  07/14/2020  - Note at rest 02 sats  98% on RA        Obese wm nad  HEENT : pt wearing mask not removed for exam due to covid - 19 concerns.   NECK :  without JVD/Nodes/TM/ nl carotid upstrokes bilaterally   LUNGS: no acc muscle use,  Min barrel  contour  chest wall with bilateral  slightly decreased bs s audible wheeze and  without cough on insp or exp maneuvers and min  Hyperresonant  to  percussion bilaterally     CV:  RRR  no s3 or murmur or increase in P2, and no edema   ABD:  soft and nontender with pos end  insp Hoover's  in the supine position. No bruits or organomegaly appreciated, bowel sounds nl  MS:   Nl gait/  ext warm without deformities, calf tenderness, cyanosis or clubbing No obvious joint restrictions   SKIN: warm and dry without lesions    NEURO:  alert, approp, nl sensorium with  no motor or cerebellar deficits apparent.                  Assessment:

## 2020-07-14 NOTE — Progress Notes (Signed)
[facility] Patient [facility] [address] [place], [place]  [address] Phone:  [phone]   Fax:  [phone]   Established Patient Office Visit  Subjective:  Patient ID: Kenneth Casey, male    DOB: 21-Feb-1956  Age: 64 y.o. MRN: [id number]  CC:  Chief Complaint  Patient presents with   Anxiety    HPI Kenneth Casey presents for anxiety. He  has a past medical history of COPD (chronic obstructive pulmonary disease) (HCC), Obese, and Pneumonia.   Anxiety Patient is here for evaluation of anxiety.  He has the following anxiety symptoms: insomnia. Onset of symptoms was approximately several years ago.  Symptoms have been stable since that time. He denies current suicidal and homicidal ideation. Family history significant for unknown.Possible organic causes contributing are: COPD. Risk factors: previous episode of depression Previous treatment includes medication Xanax.   He complains of the following medication side effects: none. He admits that he is taking it only at night when he cannot sleep. He admits that OTC PM tabs are not effective along. He is not able to sleep lying down. He sleep in his reclining bed at 45 degree angle with oxygen. He has suspected sleep apneas but does not feel as if he can use a CPAP.  He has a nocturia. He has a follow up with Urology, He is on Flomax is effective. He is only up 3 times per night.    Past Medical History:  Diagnosis Date   COPD (chronic obstructive pulmonary disease) (HCC)    Obese    Pneumonia     Past Surgical History:  Procedure Laterality Date   CHOLECYSTECTOMY      Family History  Problem Relation Age of Onset   Cancer Mother        breast cancer, lung cancer   Diabetes Father    Cancer Maternal Aunt    Heart disease Paternal Uncle     Social History   Socioeconomic History   Marital status: Married    Spouse name: Not on file   Number of children: Not on file   Years of education: Not on file    Highest education level: Not on file  Occupational History   Not on file  Tobacco Use   Smoking status: Former Smoker    Packs/day: 2.00    Years: 30.00    Pack years: 60.00    Types: Cigarettes    Quit date: 01/08/2011    Years since quitting: 9.5   Smokeless tobacco: Never Used  Vaping Use   Vaping Use: Former  Substance and Sexual Activity   Alcohol use: No    Alcohol/week: 0.0 standard drinks   Drug use: No   Sexual activity: Not on file  Other Topics Concern   Not on file  Social History Narrative   Not on file   Social Determinants of Health   Financial Resource Strain:    Difficulty of Paying Living Expenses:   Food Insecurity:    Worried About Programme researcher, broadcasting/film/video in the Last Year:    Barista in the Last Year:   Transportation Needs:    Freight forwarder (Medical):    Lack of Transportation (Non-Medical):   Physical Activity:    Days of Exercise per Week:    Minutes of Exercise per Session:   Stress:    Feeling of Stress :   Social Connections:    Frequency of Communication with Friends and Family:  Frequency of Social Gatherings with Friends and Family:    Attends Religious Services:    Active Member of Clubs or Organizations:    Attends Engineer, structural:    Marital Status:   Intimate Partner Violence:    Fear of Current or Ex-Partner:    Emotionally Abused:    Physically Abused:    Sexually Abused:     Outpatient Medications Prior to Visit  Medication Sig Dispense Refill   acetaminophen (TYLENOL) 500 MG tablet Take 1,000 mg by mouth every 6 (six) hours as needed for moderate pain.     albuterol (PROVENTIL) (2.5 MG/3ML) 0.083% nebulizer solution USE 1 VIAL IN NEBULIZER EVERY 4 HOURS AS NEEDED FOR WHEEZING OR SHORTNESS OF BREATH 375 mL 1   ALPRAZolam (XANAX) 0.25 MG tablet TAKE 1/2 (ONE-HALF) TABLET BY MOUTH  UP TO THREE TIMES DAILY AS NEEDED MUST LAST 30 DAYS 40 tablet 0   fluticasone (FLONASE)  50 MCG/ACT nasal spray USE 2 SPRAY(S) IN EACH NOSTRIL ONCE DAILY AS NEEDED FOR  ALLERGIES 16 g 0   furosemide (LASIX) 40 MG tablet Take 40 mg by mouth daily as needed.     OXYGEN 2 with sleep and exertion     PROVENTIL HFA 108 (90 Base) MCG/ACT inhaler INHALE 2 PUFFS BY MOUTH EVERY 6 HOURS AS NEEDED FOR WHEEZING OR SHORTNESS OF BREATH 7 g 3   SYMBICORT 160-4.5 MCG/ACT inhaler INHALE 2 PUFFS BY MOUTH FIRST THING IN THE MORNING AND THEN ANOTHER 2 PUFFS ABOUT 12 HOURS LATER 11 g 3   tamsulosin (FLOMAX) 0.4 MG CAPS capsule Take 0.4 mg by mouth daily.  11   busPIRone (BUSPAR) 10 MG tablet TAKE 1 TABLET BY MOUTH TWICE DAILY 60 tablet 0   carvedilol (COREG) 6.25 MG tablet TAKE 1 TABLET BY MOUTH TWICE DAILY WITH A MEAL 180 tablet 3   dextromethorphan-guaiFENesin (MUCINEX DM) 30-600 MG 12hr tablet Take 1 tablet by mouth 2 (two) times daily. 30 tablet 1   gabapentin (NEURONTIN) 400 MG capsule Take 1 capsule by mouth twice daily 60 capsule 0   naproxen (NAPROSYN) 500 MG tablet TAKE 1 TABLET BY MOUTH TWICE DAILY WITH A MEAL 60 tablet 3   triamcinolone ointment (KENALOG) 0.5 % APPLY  OINTMENT TOPICALLY TWICE DAILY (Patient not taking: Reported on 07/14/2020) 15 g 0   No facility-administered medications prior to visit.    Allergies  Allergen Reactions   Adhesive [Tape] Rash    ROS Review of Systems    Objective:    Physical Exam Constitutional:      General: He is not in acute distress.    Appearance: He is obese. He is not ill-appearing or toxic-appearing.  HENT:     Head: Normocephalic and atraumatic.     Nose: Nose normal.     Mouth/Throat:     Mouth: Mucous membranes are moist.  Cardiovascular:     Rate and Rhythm: Normal rate and regular rhythm.     Pulses: Normal pulses.          Dorsalis pedis pulses are 2+ on the right side and 2+ on the left side.       Posterior tibial pulses are 2+ on the right side and 2+ on the left side.     Heart sounds: Normal heart sounds.    Pulmonary:     Effort: Pulmonary effort is normal.     Breath sounds: Normal breath sounds.  Abdominal:     General: Bowel sounds are normal.  Palpations: Abdomen is soft.  Musculoskeletal:        General: Normal range of motion.     Cervical back: Normal range of motion.  Feet:     Right foot:     Protective Sensation: 10 sites tested. 10 sites sensed.     Skin integrity: Skin integrity normal.     Left foot:     Protective Sensation: 10 sites tested. 10 sites sensed.     Skin integrity: Skin integrity normal.  Skin:    General: Skin is warm.     Capillary Refill: Capillary refill takes less than 2 seconds.  Neurological:     General: No focal deficit present.     Mental Status: He is alert and oriented to person, place, and time.  Psychiatric:        Mood and Affect: Mood normal.        Behavior: Behavior normal.        Thought Content: Thought content normal.        Judgment: Judgment normal.     BP 135/78 (BP Location: Left Arm, Patient Position: Sitting, Cuff Size: Large)    Pulse 75    Temp 98.3 F (36.8 C) (Oral)    Resp 16    Ht 5\' 9"  (1.753 m)    Wt 253 lb (114.8 kg)    SpO2 99%    BMI 37.36 kg/m  Wt Readings from Last 3 Encounters:  07/14/20 253 lb (114.8 kg)  07/14/20 253 lb (114.8 kg)  06/01/20 245 lb (111.1 kg)     There are no preventive care reminders to display for this patient.  There are no preventive care reminders to display for this patient.  Lab Results  Component Value Date   TSH 2.564 12/08/2015   Lab Results  Component Value Date   WBC 12.0 (H) 01/13/2020   HGB 16.1 01/13/2020   HCT 47.9 01/13/2020   MCV 85 01/13/2020   PLT 302 01/13/2020   Lab Results  Component Value Date   NA 139 07/14/2020   K 4.5 07/14/2020   CO2 24 01/14/2019   GLUCOSE 87 07/14/2020   BUN 19 07/14/2020   CREATININE 0.80 07/14/2020   BILITOT 0.5 07/14/2020   ALKPHOS 80 07/14/2020   AST 15 07/14/2020   ALT 16 01/14/2019   PROT 7.0 07/14/2020    ALBUMIN 4.4 07/14/2020   CALCIUM 9.5 07/14/2020   ANIONGAP 7 12/14/2015   Lab Results  Component Value Date   CHOL 164 07/14/2020   Lab Results  Component Value Date   HDL 37 (L) 07/14/2020   Lab Results  Component Value Date   LDLCALC 115 (H) 07/14/2020   Lab Results  Component Value Date   TRIG 59 07/14/2020   Lab Results  Component Value Date   CHOLHDL 4.4 07/14/2020   Lab Results  Component Value Date   HGBA1C 5.4 01/13/2020      Assessment & Plan:   Problem List Items Addressed This Visit      Other   Insomnia Encouraged sleep study   Morbid obesity due to excess calories (HCC)     Other Visit Diagnoses    Generalized anxiety disorder    -  Primary Continue with current regimen. Will encourage tapering down   Relevant Medications   busPIRone (BUSPAR) 10 MG tablet   Prediabetes      Lifestyle modification with healthy diet (fewer calories, more high fiber foods, whole grains and non-starchy vegetables, lower fat meat  and fish, low-fat diary include healthy oils) regular exercise (physical activity) and weight loss Opthalmology exam discussed  Nutritional consult recommended Regular dental visits encouraged Home BP monitoring also encouraged goal <130/80     Relevant Orders   Microalbumin, urine (Completed)   Lipid panel (Completed)   Comp. Metabolic Panel (12) (Completed)   Essential hypertension       Relevant Medications   carvedilol (COREG) 6.25 MG tablet   Neuropathy       Relevant Medications   gabapentin (NEURONTIN) 400 MG capsule   Epididymitis       Relevant Medications   naproxen (NAPROSYN) 500 MG tablet   Acute upper respiratory infection       Relevant Medications   dextromethorphan-guaiFENesin (MUCINEX DM) 30-600 MG 12hr tablet      Meds ordered this encounter  Medications   busPIRone (BUSPAR) 10 MG tablet    Sig: Take 1 tablet (10 mg total) by mouth 2 (two) times daily.    Dispense:  180 tablet    Refill:  3    Order  Specific Question:   Supervising Provider    Answer:   Quentin Angst [[id number]]   carvedilol (COREG) 6.25 MG tablet    Sig: Take 1 tablet (6.25 mg total) by mouth 2 (two) times daily with a meal.    Dispense:  180 tablet    Refill:  3    Order Specific Question:   Supervising Provider    Answer:   Quentin Angst [[id number]]   gabapentin (NEURONTIN) 400 MG capsule    Sig: Take 1 capsule (400 mg total) by mouth 2 (two) times daily.    Dispense:  180 capsule    Refill:  3    Order Specific Question:   Supervising Provider    Answer:   Quentin Angst [[id number]]   naproxen (NAPROSYN) 500 MG tablet    Sig: TAKE 1 TABLET BY MOUTH TWICE DAILY WITH A MEAL    Dispense:  60 tablet    Refill:  3    Order Specific Question:   Supervising Provider    Answer:   Quentin Angst [[id number]]   dextromethorphan-guaiFENesin (MUCINEX DM) 30-600 MG 12hr tablet    Sig: Take 1 tablet by mouth 2 (two) times daily.    Dispense:  30 tablet    Refill:  1    Order Specific Question:   Supervising Provider    Answer:   Quentin Angst [id number]    Follow-up: Return in about 3 months (around 10/14/2020).    Barbette Merino, NP

## 2020-07-14 NOTE — Patient Instructions (Addendum)
Make sure you check your oxygen saturations at highest level of activity to be sure it stays over 90% and adjust upward to maintain this level if needed but remember to turn it back to previous settings when you stop (to conserve your supply).    Please schedule a follow up visit in 12  months but call sooner if needed  

## 2020-07-15 ENCOUNTER — Encounter: Payer: Self-pay | Admitting: Internal Medicine

## 2020-07-15 LAB — COMP. METABOLIC PANEL (12)
AST: 15 IU/L (ref 0–40)
Albumin/Globulin Ratio: 1.7 (ref 1.2–2.2)
Albumin: 4.4 g/dL (ref 3.8–4.8)
Alkaline Phosphatase: 80 IU/L (ref 48–121)
BUN/Creatinine Ratio: 24 (ref 10–24)
BUN: 19 mg/dL (ref 8–27)
Bilirubin Total: 0.5 mg/dL (ref 0.0–1.2)
Calcium: 9.5 mg/dL (ref 8.6–10.2)
Chloride: 102 mmol/L (ref 96–106)
Creatinine, Ser: 0.8 mg/dL (ref 0.76–1.27)
GFR calc Af Amer: 111 mL/min/{1.73_m2} (ref >59)
GFR calc non Af Amer: 96 mL/min/{1.73_m2} (ref >59)
Globulin, Total: 2.6 g/dL (ref 1.5–4.5)
Glucose: 87 mg/dL (ref 65–99)
Potassium: 4.5 mmol/L (ref 3.5–5.2)
Sodium: 139 mmol/L (ref 134–144)
Total Protein: 7 g/dL (ref 6.0–8.5)

## 2020-07-15 LAB — MICROALBUMIN, URINE: Microalbumin, Urine: 6.7 ug/mL

## 2020-07-15 LAB — LIPID PANEL
Chol/HDL Ratio: 4.4 ratio (ref 0.0–5.0)
Cholesterol, Total: 164 mg/dL (ref 100–199)
HDL: 37 mg/dL — ABNORMAL LOW (ref >39)
LDL Chol Calc (NIH): 115 mg/dL — ABNORMAL HIGH (ref 0–99)
Triglycerides: 59 mg/dL (ref 0–149)
VLDL Cholesterol Cal: 12 mg/dL (ref 5–40)

## 2020-07-15 NOTE — Assessment & Plan Note (Addendum)
Quit smoking 2012  12/20/2015  extensive coaching HFA effectiveness =    75% > continue symbicort 160 2bid - Spirometry 01/17/2016  FEV1 1.62 (44%)  Ratio 61 - 05/28/2016   try bevespi 2bid > improved 09/03/2016 but decided liked symbicort better = 80 bid> increased to 160 2bid  08/13/17  - 04/14/2018  After extensive coaching inhaler device  effectiveness =   90% p 4 tries  - 07/14/2018  After extensive coaching inhaler device  effectiveness =    90%  - PFT's  07/14/2018  FEV1 1.80 (57 % ) ratio 60  p 9 % improvement from saba p nothing prior to study with DLCO  94 % corrects to 121 % for alv volume   And erv 16%    GOLD II well compensated on symb 160 2bid with  Minimal need for saba and more limited by leg than doe   Might be a good candidate for trial of breztri 2 bid if fails symbiocort 160 either in terms of limiting sob or aecopd.   Reviewed again: I spent extra time with pt today reviewing appropriate use of albuterol for prn use on exertion with the following points: 1) saba is for relief of sob that does not improve by walking a slower pace or resting but rather if the pt does not improve after trying this first. 2) If the pt is convinced, as many are, that saba helps recover from activity faster then it's easy to tell if this is the case by re-challenging : ie stop, take the inhaler, then p 5 minutes try the exact same activity (intensity of workload) that just caused the symptoms and see if they are substantially diminished or not after saba 3) if there is an activity that reproducibly causes the symptoms, try the saba 15 min before the activity on alternate days   If in fact the saba really does help, then fine to continue to use it prn but advised may need to look closer at the maintenance regimen being used to achieve better control of airways disease with exertion.

## 2020-07-15 NOTE — Assessment & Plan Note (Signed)
On 02 3lpm since d/c 11/18/2015 - HCO3  34  12/14/15  - 01/17/2016 sats mid 90's RA at rest so rec 2lpm sleeping / exerting but none needed at rest  - 04/16/2016  Walked 4lpm pulsed x 3 laps @ 185 ft each stopped due to end of study, mild sob, no desat  - 08/13/2017  Walked RA x 3 laps @ 185 ft each stopped due to  End of study, nl to mod fast pace, no desat , min sob   - 01/13/2018  Walked RA x 3 laps @ 185 ft each stopped due to  End of study, nl pace, no sob or desat   - HCO3  01/13/2018  =  30 c/w only mild hypercarbia   - ONO RA  04/01/18 desat x 5 h and 20 min so needs 2lpm   - 04/14/2018  Walked RA x 3 laps @ 185 ft each stopped due to  End of study, sats 88% corrected on 2lpm  - 04/22/18  ono on 3lpm = 1h 44 min  So rec 4lpm and repeat on 4lpm > done 05/01/18  only 12 min sat @ < 89% so no change rx  - 10/20/2018 pt titrated down to 2lpm with wt loss and "sleeps great"    Doing better with wt loss so ok on 2lpm and titrate daytime to keep > 90%  Advised Make sure you check your oxygen saturations at highest level of activity to be sure it stays over 90% and adjust upward to maintain this level if needed but remember to turn it back to previous settings when you stop (to conserve your supply).           Each maintenance medication was reviewed in detail including emphasizing most importantly the difference between maintenance and prns and under what circumstances the prns are to be triggered using an action plan format where appropriate.  Total time for H and P, chart review, counseling, reviewing hfa  device and generating customized AVS unique to this office visit / charting = 20 min

## 2020-07-16 ENCOUNTER — Encounter: Payer: Self-pay | Admitting: Nurse Practitioner

## 2020-07-19 ENCOUNTER — Other Ambulatory Visit: Payer: Self-pay | Admitting: Internal Medicine

## 2020-07-19 ENCOUNTER — Other Ambulatory Visit: Payer: Self-pay | Admitting: Nurse Practitioner

## 2020-07-19 DIAGNOSIS — J302 Other seasonal allergic rhinitis: Secondary | ICD-10-CM

## 2020-07-19 DIAGNOSIS — F411 Generalized anxiety disorder: Secondary | ICD-10-CM

## 2020-07-19 NOTE — Telephone Encounter (Signed)
Can you please advise on xanax refill?

## 2020-07-27 DIAGNOSIS — R351 Nocturia: Secondary | ICD-10-CM | POA: Diagnosis not present

## 2020-07-27 DIAGNOSIS — Z125 Encounter for screening for malignant neoplasm of prostate: Secondary | ICD-10-CM | POA: Diagnosis not present

## 2020-07-27 DIAGNOSIS — N401 Enlarged prostate with lower urinary tract symptoms: Secondary | ICD-10-CM | POA: Diagnosis not present

## 2020-07-27 DIAGNOSIS — N528 Other male erectile dysfunction: Secondary | ICD-10-CM | POA: Diagnosis not present

## 2020-08-10 DIAGNOSIS — J449 Chronic obstructive pulmonary disease, unspecified: Secondary | ICD-10-CM | POA: Diagnosis not present

## 2020-08-23 ENCOUNTER — Other Ambulatory Visit: Payer: Self-pay | Admitting: Family Medicine

## 2020-08-23 DIAGNOSIS — F411 Generalized anxiety disorder: Secondary | ICD-10-CM

## 2020-08-23 DIAGNOSIS — J302 Other seasonal allergic rhinitis: Secondary | ICD-10-CM

## 2020-08-25 NOTE — Telephone Encounter (Signed)
Refill request for Alprazolam 0.25mg  tabs

## 2020-09-09 DIAGNOSIS — J449 Chronic obstructive pulmonary disease, unspecified: Secondary | ICD-10-CM | POA: Diagnosis not present

## 2020-09-26 ENCOUNTER — Other Ambulatory Visit: Payer: Self-pay | Admitting: Nurse Practitioner

## 2020-09-26 ENCOUNTER — Other Ambulatory Visit: Payer: Self-pay | Admitting: Family Medicine

## 2020-09-26 DIAGNOSIS — J302 Other seasonal allergic rhinitis: Secondary | ICD-10-CM

## 2020-09-26 DIAGNOSIS — F411 Generalized anxiety disorder: Secondary | ICD-10-CM

## 2020-09-27 ENCOUNTER — Other Ambulatory Visit: Payer: Self-pay

## 2020-10-10 DIAGNOSIS — J9611 Chronic respiratory failure with hypoxia: Secondary | ICD-10-CM | POA: Diagnosis not present

## 2020-10-10 DIAGNOSIS — J449 Chronic obstructive pulmonary disease, unspecified: Secondary | ICD-10-CM | POA: Diagnosis not present

## 2020-10-14 ENCOUNTER — Ambulatory Visit: Payer: Medicaid Other | Admitting: Nurse Practitioner

## 2020-10-26 ENCOUNTER — Other Ambulatory Visit: Payer: Self-pay | Admitting: Nurse Practitioner

## 2020-10-26 DIAGNOSIS — J302 Other seasonal allergic rhinitis: Secondary | ICD-10-CM

## 2020-10-26 DIAGNOSIS — F411 Generalized anxiety disorder: Secondary | ICD-10-CM

## 2020-11-01 ENCOUNTER — Telehealth: Payer: Self-pay | Admitting: Nurse Practitioner

## 2020-11-01 NOTE — Telephone Encounter (Signed)
This is from last week, so not sure if it is duplicated.

## 2020-11-02 ENCOUNTER — Other Ambulatory Visit: Payer: Self-pay

## 2020-11-02 ENCOUNTER — Other Ambulatory Visit: Payer: Self-pay | Admitting: Nurse Practitioner

## 2020-11-02 DIAGNOSIS — F411 Generalized anxiety disorder: Secondary | ICD-10-CM

## 2020-11-02 DIAGNOSIS — J302 Other seasonal allergic rhinitis: Secondary | ICD-10-CM

## 2020-11-02 MED ORDER — ALPRAZOLAM 0.25 MG PO TABS: ORAL_TABLET | ORAL | 0 refills | Status: DC

## 2020-11-02 MED ORDER — FLUTICASONE PROPIONATE 50 MCG/ACT NA SUSP: 2.0000 | Freq: Every day | NASAL | 11 refills | Status: DC

## 2020-11-02 NOTE — Telephone Encounter (Signed)
sent 

## 2020-11-09 DIAGNOSIS — J9611 Chronic respiratory failure with hypoxia: Secondary | ICD-10-CM | POA: Diagnosis not present

## 2020-11-09 DIAGNOSIS — J449 Chronic obstructive pulmonary disease, unspecified: Secondary | ICD-10-CM | POA: Diagnosis not present

## 2020-11-26 ENCOUNTER — Other Ambulatory Visit: Payer: Self-pay | Admitting: Internal Medicine

## 2020-11-26 ENCOUNTER — Other Ambulatory Visit: Payer: Self-pay | Admitting: Nurse Practitioner

## 2020-11-26 DIAGNOSIS — N451 Epididymitis: Secondary | ICD-10-CM

## 2020-11-28 NOTE — Telephone Encounter (Signed)
Is this okay?

## 2020-11-29 ENCOUNTER — Other Ambulatory Visit: Payer: Self-pay | Admitting: Internal Medicine

## 2020-11-29 MED ORDER — SYMBICORT 160-4.5 MCG/ACT IN AERO
INHALATION_SPRAY | RESPIRATORY_TRACT | 11 refills | Status: DC
Start: 2020-11-29 — End: 2021-12-26

## 2020-12-05 ENCOUNTER — Other Ambulatory Visit: Payer: Self-pay | Admitting: Nurse Practitioner

## 2020-12-05 DIAGNOSIS — F411 Generalized anxiety disorder: Secondary | ICD-10-CM

## 2020-12-05 DIAGNOSIS — J302 Other seasonal allergic rhinitis: Secondary | ICD-10-CM

## 2020-12-06 ENCOUNTER — Other Ambulatory Visit: Payer: Self-pay | Admitting: Family Medicine

## 2020-12-06 DIAGNOSIS — J302 Other seasonal allergic rhinitis: Secondary | ICD-10-CM

## 2020-12-06 MED ORDER — FLUTICASONE PROPIONATE 50 MCG/ACT NA SUSP: NASAL | 3 refills | Status: DC

## 2020-12-06 NOTE — Telephone Encounter (Signed)
Is this okay to refill? 

## 2020-12-10 DIAGNOSIS — J449 Chronic obstructive pulmonary disease, unspecified: Secondary | ICD-10-CM | POA: Diagnosis not present

## 2020-12-10 DIAGNOSIS — J9611 Chronic respiratory failure with hypoxia: Secondary | ICD-10-CM | POA: Diagnosis not present

## 2020-12-14 ENCOUNTER — Other Ambulatory Visit: Payer: Self-pay | Admitting: Nurse Practitioner

## 2020-12-14 DIAGNOSIS — F411 Generalized anxiety disorder: Secondary | ICD-10-CM

## 2020-12-14 MED ORDER — ALPRAZOLAM 0.25 MG PO TABS: 0.2500 mg | ORAL_TABLET | Freq: Every evening | ORAL | 0 refills | Status: DC | PRN

## 2020-12-21 ENCOUNTER — Ambulatory Visit (INDEPENDENT_AMBULATORY_CARE_PROVIDER_SITE_OTHER): Payer: Medicaid Other | Admitting: Nurse Practitioner

## 2020-12-21 ENCOUNTER — Encounter: Payer: Self-pay | Admitting: Nurse Practitioner

## 2020-12-21 ENCOUNTER — Other Ambulatory Visit: Payer: Self-pay

## 2020-12-21 VITALS — BP 134/78 | HR 68 | Temp 97.7°F | Ht 69.0 in | Wt 270.0 lb

## 2020-12-21 DIAGNOSIS — I10 Essential (primary) hypertension: Secondary | ICD-10-CM | POA: Diagnosis not present

## 2020-12-21 DIAGNOSIS — Z13220 Encounter for screening for lipoid disorders: Secondary | ICD-10-CM | POA: Diagnosis not present

## 2020-12-21 DIAGNOSIS — R7303 Prediabetes: Secondary | ICD-10-CM

## 2020-12-21 DIAGNOSIS — F411 Generalized anxiety disorder: Secondary | ICD-10-CM

## 2020-12-21 DIAGNOSIS — I5032 Chronic diastolic (congestive) heart failure: Secondary | ICD-10-CM | POA: Diagnosis not present

## 2020-12-21 DIAGNOSIS — F5105 Insomnia due to other mental disorder: Secondary | ICD-10-CM

## 2020-12-21 DIAGNOSIS — F99 Mental disorder, not otherwise specified: Secondary | ICD-10-CM | POA: Diagnosis not present

## 2020-12-21 NOTE — Progress Notes (Signed)
[facility] Patient [facility] [address] [place], [place]  [address] Phone:  [phone]   Fax:  [phone]   Established Patient Office Visit  Subjective:  Patient ID: Kenneth Casey, male    DOB: [birth year]  Age: 65 y.o. MRN: [id number]  CC:  Chief Complaint  Patient presents with  . Follow-up    Follow up ,    HPI Kenneth Casey presents for follow up. He  has a past medical history of COPD (chronic obstructive pulmonary disease) (HCC), Obese, and Pneumonia.   He does continue to follow up with pulmonology for the COPD. He admits that this time of the year is rough.  He has insomnia due to anxiety disorder. He does not rest well. This is also related to the COPD and CHF.He does not follow up with cardiology.  He sleeps in a recliner mostly. He does feel like he has been able to use his adjustable bed a few more times in the last few months.    Past Medical History:  Diagnosis Date  . COPD (chronic obstructive pulmonary disease) (HCC)   . Obese   . Pneumonia     Past Surgical History:  Procedure Laterality Date  . CHOLECYSTECTOMY      Family History  Problem Relation Age of Onset  . Cancer Mother        breast cancer, lung cancer  . Diabetes Father   . Cancer Maternal Aunt   . Heart disease Paternal Uncle     Social History   Socioeconomic History  . Marital status: Married    Spouse name: Not on file  . Number of children: Not on file  . Years of education: Not on file  . Highest education level: Not on file  Occupational History  . Not on file  Tobacco Use  . Smoking status: Former Smoker    Packs/day: 2.00    Years: 30.00    Pack years: 60.00    Types: Cigarettes    Quit date: 01/08/2011    Years since quitting: 9.9  . Smokeless tobacco: Never Used  Vaping Use  . Vaping Use: Former  Substance and Sexual Activity  . Alcohol use: No    Alcohol/week: 0.0 standard drinks  . Drug use: No  . Sexual activity: Not on file  Other Topics  Concern  . Not on file  Social History Narrative  . Not on file   Social Determinants of Health   Financial Resource Strain: Not on file  Food Insecurity: Not on file  Transportation Needs: Not on file  Physical Activity: Not on file  Stress: Not on file  Social Connections: Not on file  Intimate Partner Violence: Not on file    Outpatient Medications Prior to Visit  Medication Sig Dispense Refill  . acetaminophen (TYLENOL) 500 MG tablet Take 1,000 mg by mouth every 6 (six) hours as needed for moderate pain.    Marland Kitchen albuterol (PROVENTIL) (2.5 MG/3ML) 0.083% nebulizer solution USE 1 VIAL IN NEBULIZER EVERY 4 HOURS AS NEEDED FOR WHEEZING OR SHORTNESS OF BREATH 375 mL 1  . ALPRAZolam (XANAX) 0.25 MG tablet Take 1 tablet (0.25 mg total) by mouth at bedtime as needed for anxiety. 30 tablet 0  . busPIRone (BUSPAR) 10 MG tablet Take 1 tablet (10 mg total) by mouth 2 (two) times daily. 180 tablet 3  . carvedilol (COREG) 6.25 MG tablet Take 1 tablet (6.25 mg total) by mouth 2 (two) times daily with a meal. 180  tablet 3  . dextromethorphan-guaiFENesin (MUCINEX DM) 30-600 MG 12hr tablet Take 1 tablet by mouth 2 (two) times daily. 30 tablet 1  . fluticasone (FLONASE) 50 MCG/ACT nasal spray USE 2 SPRAY(S) IN EACH NOSTRIL ONCE DAILY AS NEEDED FOR ALLERGIES 16 g 3  . gabapentin (NEURONTIN) 400 MG capsule Take 1 capsule (400 mg total) by mouth 2 (two) times daily. 180 capsule 3  . naproxen (NAPROSYN) 500 MG tablet TAKE 1 TABLET BY MOUTH TWICE DAILY WITH A MEAL 60 tablet 0  . OXYGEN 2 with sleep and exertion    . PROAIR HFA 108 (90 Base) MCG/ACT inhaler INHALE 2 PUFFS BY MOUTH EVERY 6 HOURS AS NEEDED FOR  WHEEZING  OR  SHORTNESS  OF  BREATH 18 g 3  . SYMBICORT 160-4.5 MCG/ACT inhaler INHALE 2 PUFFS BY MOUTH FIRST THING IN THE MORNING AND THEN ANOTHER 2 PUFFS ABOUT 12 HOURS LATER 10.2 g 11  . furosemide (LASIX) 40 MG tablet Take 40 mg by mouth daily as needed. (Patient not taking: Reported on 12/21/2020)     . tamsulosin (FLOMAX) 0.4 MG CAPS capsule Take 0.4 mg by mouth daily. (Patient not taking: Reported on 12/21/2020)  11   No facility-administered medications prior to visit.    Allergies  Allergen Reactions  . Adhesive [Tape] Rash    ROS Review of Systems  Respiratory: Positive for cough and shortness of breath (sometimes with exertion up a hill).   Cardiovascular: Negative for chest pain.  Gastrointestinal: Negative for nausea.       He admits that last month he a was having food stuck throat and he drank an cola and then he would throw up. He denies any problems in Jan.  Denies any acid reflux       Objective:    Physical Exam Constitutional:      Appearance: He is obese.  HENT:     Head: Normocephalic and atraumatic.  Cardiovascular:     Rate and Rhythm: Normal rate and regular rhythm.     Pulses: Normal pulses.     Heart sounds: Normal heart sounds.  Pulmonary:     Comments: Right side coarse sounds Abdominal:     Palpations: Abdomen is soft.     Tenderness: There is no abdominal tenderness. There is no right CVA tenderness or left CVA tenderness.     Comments: Abdominal weakness  Neurological:     Mental Status: He is alert.     BP 134/78 (BP Location: Left Arm, Patient Position: Sitting, Cuff Size: Normal)   Pulse 68   Temp 97.7 F (36.5 C) (Temporal)   Ht 5\' 9"  (1.753 m)   Wt 270 lb (122.5 kg)   SpO2 95%   BMI 39.87 kg/m  Wt Readings from Last 3 Encounters:  12/21/20 270 lb (122.5 kg)  07/14/20 253 lb (114.8 kg)  07/14/20 253 lb (114.8 kg)     Health Maintenance Due  Topic Date Due  . OPHTHALMOLOGY EXAM  Never done  . INFLUENZA VACCINE  07/10/2020    There are no preventive care reminders to display for this patient.  Lab Results  Component Value Date   TSH 2.564 12/08/2015   Lab Results  Component Value Date   WBC 12.0 (H) 01/13/2020   HGB 16.1 01/13/2020   HCT 47.9 01/13/2020   MCV 85 01/13/2020   PLT 302 01/13/2020   Lab Results   Component Value Date   NA 142 12/21/2020   K 4.7 12/21/2020  CO2 24 01/14/2019   GLUCOSE 99 12/21/2020   BUN 24 12/21/2020   CREATININE 0.95 12/21/2020   BILITOT 0.3 12/21/2020   ALKPHOS 77 12/21/2020   AST 17 12/21/2020   ALT 16 01/14/2019   PROT 7.0 12/21/2020   ALBUMIN 4.5 12/21/2020   CALCIUM 9.2 12/21/2020   ANIONGAP 7 12/14/2015   Lab Results  Component Value Date   CHOL 163 12/21/2020   Lab Results  Component Value Date   HDL 35 (L) 12/21/2020   Lab Results  Component Value Date   LDLCALC 117 (H) 12/21/2020   Lab Results  Component Value Date   TRIG 55 12/21/2020   Lab Results  Component Value Date   CHOLHDL 4.7 12/21/2020   Lab Results  Component Value Date   HGBA1C 5.4 01/13/2020      Assessment & Plan:   Problem List Items Addressed This Visit      Cardiovascular and Mediastinum   Chronic diastolic CHF (congestive heart failure) (HCC) Stable on Carvedilol and prn furosemide      Other   Insomnia Persistent encourage over the counter sleep aides. He used this when he was out of his medication; alprazolam     Other Visit Diagnoses    Generalized anxiety disorder    -  Primary Persistent continues on alprazolam, he is down to 30 tabs per month. Will continue to wean to off. He is not interested in getting established with psychiatry.   Prediabetes     Declined A1C or CBG. Labs pending   Relevant Orders   Lipid panel (Completed)   Comp. Metabolic Panel (12) (Completed)   Essential hypertension    Stable with carvedilol     Relevant Orders   Comp. Metabolic Panel (12) (Completed)   Screening for cholesterol level       Relevant Orders   Lipid panel (Completed)      No orders of the defined types were placed in this encounter.   Follow-up: Return in about 6 months (around 06/20/2021) for follow up HTN ([id number]) [id number].    Barbette Merino, NP

## 2020-12-21 NOTE — Patient Instructions (Addendum)
Prediabetes Eating Plan Prediabetes is a condition that causes blood sugar (glucose) levels to be higher than normal. This increases the risk for developing type 2 diabetes (type 2 diabetes mellitus). Working with a health care provider or nutrition specialist (dietitian) to make diet and lifestyle changes can help prevent the onset of diabetes. These changes may help you:  Control your blood glucose levels.  Improve your cholesterol levels.  Manage your blood pressure. What are tips for following this plan? Reading food labels  Read food labels to check the amount of fat, salt (sodium), and sugar in prepackaged foods. Avoid foods that have: ? Saturated fats. ? Trans fats. ? Added sugars.  Avoid foods that have more than 300 milligrams (mg) of sodium per serving. Limit your sodium intake to less than 2,300 mg each day. Shopping  Avoid buying pre-made and processed foods.  Avoid buying drinks with added sugar. Cooking  Cook with olive oil. Do not use butter, lard, or ghee.  Bake, broil, grill, steam, or boil foods. Avoid frying. Meal planning  Work with your dietitian to create an eating plan that is right for you. This may include tracking how many calories you take in each day. Use a food diary, notebook, or mobile application to track what you eat at each meal.  Consider following a Mediterranean diet. This includes: ? Eating several servings of fresh fruits and vegetables each day. ? Eating fish at least twice a week. ? Eating one serving each day of whole grains, beans, nuts, and seeds. ? Using olive oil instead of other fats. ? Limiting alcohol. ? Limiting red meat. ? Using nonfat or low-fat dairy products.  Consider following a plant-based diet. This includes dietary choices that focus on eating mostly vegetables and fruit, grains, beans, nuts, and seeds.  If you have high blood pressure, you may need to limit your sodium intake or follow a diet such as the DASH  (Dietary Approaches to Stop Hypertension) eating plan. The DASH diet aims to lower high blood pressure.   Lifestyle  Set weight loss goals with help from your health care team. It is recommended that most people with prediabetes lose 7% of their body weight.  Exercise for at least 30 minutes 5 or more days a week.  Attend a support group or seek support from a mental health counselor.  Take over-the-counter and prescription medicines only as told by your health care provider. What foods are recommended? Fruits Berries. Bananas. Apples. Oranges. Grapes. Papaya. Mango. Pomegranate. Kiwi. Grapefruit. Cherries. Vegetables Lettuce. Spinach. Peas. Beets. Cauliflower. Cabbage. Broccoli. Carrots. Tomatoes. Squash. Eggplant. Herbs. Peppers. Onions. Cucumbers. Brussels sprouts. Grains Whole grains, such as whole-wheat or whole-grain breads, crackers, cereals, and pasta. Unsweetened oatmeal. Bulgur. Barley. Quinoa. Brown rice. Corn or whole-wheat flour tortillas or taco shells. Meats and other proteins Seafood. Poultry without skin. Lean cuts of pork and beef. Tofu. Eggs. Nuts. Beans. Dairy Low-fat or fat-free dairy products, such as yogurt, cottage cheese, and cheese. Beverages Water. Tea. Coffee. Sugar-free or diet soda. Seltzer water. Low-fat or nonfat milk. Milk alternatives, such as soy or almond milk. Fats and oils Olive oil. Canola oil. Sunflower oil. Grapeseed oil. Avocado. Walnuts. Sweets and desserts Sugar-free or low-fat pudding. Sugar-free or low-fat ice cream and other frozen treats. Seasonings and condiments Herbs. Sodium-free spices. Mustard. Relish. Low-salt, low-sugar ketchup. Low-salt, low-sugar barbecue sauce. Low-fat or fat-free mayonnaise. The items listed above may not be a complete list of recommended foods and beverages. Contact a dietitian for more   information. What foods are not recommended? Fruits Fruits canned with syrup. Vegetables Canned vegetables. Frozen  vegetables with butter or cream sauce. Grains Refined white flour and flour products, such as bread, pasta, snack foods, and cereals. Meats and other proteins Fatty cuts of meat. Poultry with skin. Breaded or fried meat. Processed meats. Dairy Full-fat yogurt, cheese, or milk. Beverages Sweetened drinks, such as iced tea and soda. Fats and oils Butter. Lard. Ghee. Sweets and desserts Baked goods, such as cake, cupcakes, pastries, cookies, and cheesecake. Seasonings and condiments Spice mixes with added salt. Ketchup. Barbecue sauce. Mayonnaise. The items listed above may not be a complete list of foods and beverages that are not recommended. Contact a dietitian for more information. Where to find more information  American Diabetes Association: www.diabetes.org Summary  You may need to make diet and lifestyle changes to help prevent the onset of diabetes. These changes can help you control blood sugar, improve cholesterol levels, and manage blood pressure.  Set weight loss goals with help from your health care team. It is recommended that most people with prediabetes lose 7% of their body weight.  Consider following a Mediterranean diet. This includes eating plenty of fresh fruits and vegetables, whole grains, beans, nuts, seeds, fish, and low-fat dairy, and using olive oil instead of other fats. This information is not intended to replace advice given to you by your health care provider. Make sure you discuss any questions you have with your health care provider. Document Revised: 02/25/2020 Document Reviewed: 02/25/2020 Elsevier Patient Education  2021 Elsevier Inc.  

## 2020-12-22 LAB — COMP. METABOLIC PANEL (12)
AST: 17 IU/L (ref 0–40)
Albumin/Globulin Ratio: 1.8 (ref 1.2–2.2)
Albumin: 4.5 g/dL (ref 3.8–4.8)
Alkaline Phosphatase: 77 IU/L (ref 44–121)
BUN/Creatinine Ratio: 25 — ABNORMAL HIGH (ref 10–24)
BUN: 24 mg/dL (ref 8–27)
Bilirubin Total: 0.3 mg/dL (ref 0.0–1.2)
Calcium: 9.2 mg/dL (ref 8.6–10.2)
Chloride: 103 mmol/L (ref 96–106)
Creatinine, Ser: 0.95 mg/dL (ref 0.76–1.27)
GFR calc Af Amer: 99 mL/min/{1.73_m2} (ref >59)
GFR calc non Af Amer: 85 mL/min/{1.73_m2} (ref >59)
Globulin, Total: 2.5 g/dL (ref 1.5–4.5)
Glucose: 99 mg/dL (ref 65–99)
Potassium: 4.7 mmol/L (ref 3.5–5.2)
Sodium: 142 mmol/L (ref 134–144)
Total Protein: 7 g/dL (ref 6.0–8.5)

## 2020-12-22 LAB — LIPID PANEL
Chol/HDL Ratio: 4.7 ratio (ref 0.0–5.0)
Cholesterol, Total: 163 mg/dL (ref 100–199)
HDL: 35 mg/dL — ABNORMAL LOW (ref >39)
LDL Chol Calc (NIH): 117 mg/dL — ABNORMAL HIGH (ref 0–99)
Triglycerides: 55 mg/dL (ref 0–149)
VLDL Cholesterol Cal: 11 mg/dL (ref 5–40)

## 2020-12-23 ENCOUNTER — Encounter: Payer: Self-pay | Admitting: Nurse Practitioner

## 2021-01-10 DIAGNOSIS — J9611 Chronic respiratory failure with hypoxia: Secondary | ICD-10-CM | POA: Diagnosis not present

## 2021-01-10 DIAGNOSIS — J449 Chronic obstructive pulmonary disease, unspecified: Secondary | ICD-10-CM | POA: Diagnosis not present

## 2021-01-16 ENCOUNTER — Other Ambulatory Visit: Payer: Self-pay | Admitting: Nurse Practitioner

## 2021-01-16 DIAGNOSIS — F411 Generalized anxiety disorder: Secondary | ICD-10-CM

## 2021-02-07 DIAGNOSIS — J449 Chronic obstructive pulmonary disease, unspecified: Secondary | ICD-10-CM | POA: Diagnosis not present

## 2021-02-07 DIAGNOSIS — J9611 Chronic respiratory failure with hypoxia: Secondary | ICD-10-CM | POA: Diagnosis not present

## 2021-02-17 ENCOUNTER — Other Ambulatory Visit: Payer: Self-pay | Admitting: Nurse Practitioner

## 2021-02-17 DIAGNOSIS — F411 Generalized anxiety disorder: Secondary | ICD-10-CM

## 2021-02-21 ENCOUNTER — Other Ambulatory Visit: Payer: Self-pay

## 2021-02-21 DIAGNOSIS — F411 Generalized anxiety disorder: Secondary | ICD-10-CM

## 2021-03-10 DIAGNOSIS — J9611 Chronic respiratory failure with hypoxia: Secondary | ICD-10-CM | POA: Diagnosis not present

## 2021-03-10 DIAGNOSIS — J449 Chronic obstructive pulmonary disease, unspecified: Secondary | ICD-10-CM | POA: Diagnosis not present

## 2021-03-22 ENCOUNTER — Other Ambulatory Visit: Payer: Self-pay | Admitting: Nurse Practitioner

## 2021-03-22 DIAGNOSIS — F411 Generalized anxiety disorder: Secondary | ICD-10-CM

## 2021-03-30 ENCOUNTER — Other Ambulatory Visit: Payer: Self-pay | Admitting: Nurse Practitioner

## 2021-03-30 DIAGNOSIS — N451 Epididymitis: Secondary | ICD-10-CM

## 2021-04-09 DIAGNOSIS — J9611 Chronic respiratory failure with hypoxia: Secondary | ICD-10-CM | POA: Diagnosis not present

## 2021-04-09 DIAGNOSIS — J449 Chronic obstructive pulmonary disease, unspecified: Secondary | ICD-10-CM | POA: Diagnosis not present

## 2021-04-20 ENCOUNTER — Other Ambulatory Visit: Payer: Self-pay | Admitting: Nurse Practitioner

## 2021-04-20 DIAGNOSIS — F411 Generalized anxiety disorder: Secondary | ICD-10-CM

## 2021-04-27 ENCOUNTER — Telehealth: Payer: Self-pay

## 2021-04-27 ENCOUNTER — Encounter: Payer: Self-pay | Admitting: Nurse Practitioner

## 2021-04-27 NOTE — Telephone Encounter (Signed)
He needs to provide [place] with his juror number and his expected date of service. Thanks

## 2021-04-27 NOTE — Telephone Encounter (Signed)
Everyone has a number but I have completed it.  It will be up front thanks

## 2021-04-27 NOTE — Telephone Encounter (Signed)
Patient notified to pick up letter.

## 2021-04-27 NOTE — Progress Notes (Signed)
   [facility] [address] [place], [place]  [address] Phone:  [phone]   Fax:  [phone] 

## 2021-04-27 NOTE — Telephone Encounter (Signed)
Pt is requiring to attend Jury duty and he's on H20 and is wanting Crystal to write him a note that he doesn't go out in public. Please let him know.

## 2021-05-01 ENCOUNTER — Other Ambulatory Visit: Payer: Self-pay | Admitting: Nurse Practitioner

## 2021-05-01 ENCOUNTER — Other Ambulatory Visit: Payer: Self-pay | Admitting: Internal Medicine

## 2021-05-01 DIAGNOSIS — N451 Epididymitis: Secondary | ICD-10-CM

## 2021-05-10 DIAGNOSIS — J449 Chronic obstructive pulmonary disease, unspecified: Secondary | ICD-10-CM | POA: Diagnosis not present

## 2021-05-10 DIAGNOSIS — J9611 Chronic respiratory failure with hypoxia: Secondary | ICD-10-CM | POA: Diagnosis not present

## 2021-05-18 ENCOUNTER — Other Ambulatory Visit: Payer: Self-pay | Admitting: Nurse Practitioner

## 2021-05-18 DIAGNOSIS — F411 Generalized anxiety disorder: Secondary | ICD-10-CM

## 2021-05-22 ENCOUNTER — Telehealth: Payer: Self-pay | Admitting: Internal Medicine

## 2021-05-22 DIAGNOSIS — J449 Chronic obstructive pulmonary disease, unspecified: Secondary | ICD-10-CM

## 2021-05-22 NOTE — Telephone Encounter (Signed)
Uses [employer] in [place] for his nebulizer supplies.  He stated that he is needing to get a new MASK for the nebulizer and not the kits.  Order has been sent for this.  Pt has a pending appt with MW on 07/14/2021.

## 2021-05-24 DIAGNOSIS — J449 Chronic obstructive pulmonary disease, unspecified: Secondary | ICD-10-CM | POA: Diagnosis not present

## 2021-05-30 ENCOUNTER — Other Ambulatory Visit: Payer: Self-pay | Admitting: Nurse Practitioner

## 2021-05-30 DIAGNOSIS — N451 Epididymitis: Secondary | ICD-10-CM

## 2021-06-09 DIAGNOSIS — J449 Chronic obstructive pulmonary disease, unspecified: Secondary | ICD-10-CM | POA: Diagnosis not present

## 2021-06-09 DIAGNOSIS — J9611 Chronic respiratory failure with hypoxia: Secondary | ICD-10-CM | POA: Diagnosis not present

## 2021-06-15 ENCOUNTER — Other Ambulatory Visit: Payer: Self-pay | Admitting: Nurse Practitioner

## 2021-06-15 DIAGNOSIS — F411 Generalized anxiety disorder: Secondary | ICD-10-CM

## 2021-06-21 ENCOUNTER — Other Ambulatory Visit: Payer: Self-pay

## 2021-06-21 ENCOUNTER — Encounter: Payer: Self-pay | Admitting: Nurse Practitioner

## 2021-06-21 ENCOUNTER — Ambulatory Visit (INDEPENDENT_AMBULATORY_CARE_PROVIDER_SITE_OTHER): Payer: Medicaid Other | Admitting: Nurse Practitioner

## 2021-06-21 VITALS — BP 159/71 | HR 75 | Temp 98.6°F | Ht 69.0 in | Wt 270.0 lb

## 2021-06-21 DIAGNOSIS — G629 Polyneuropathy, unspecified: Secondary | ICD-10-CM

## 2021-06-21 DIAGNOSIS — I5032 Chronic diastolic (congestive) heart failure: Secondary | ICD-10-CM

## 2021-06-21 DIAGNOSIS — J440 Chronic obstructive pulmonary disease with acute lower respiratory infection: Secondary | ICD-10-CM | POA: Diagnosis not present

## 2021-06-21 DIAGNOSIS — J302 Other seasonal allergic rhinitis: Secondary | ICD-10-CM

## 2021-06-21 DIAGNOSIS — F411 Generalized anxiety disorder: Secondary | ICD-10-CM

## 2021-06-21 DIAGNOSIS — Z13220 Encounter for screening for lipoid disorders: Secondary | ICD-10-CM

## 2021-06-21 DIAGNOSIS — R7303 Prediabetes: Secondary | ICD-10-CM | POA: Diagnosis not present

## 2021-06-21 DIAGNOSIS — I10 Essential (primary) hypertension: Secondary | ICD-10-CM

## 2021-06-21 DIAGNOSIS — J209 Acute bronchitis, unspecified: Secondary | ICD-10-CM

## 2021-06-21 LAB — POCT GLYCOSYLATED HEMOGLOBIN (HGB A1C): Hemoglobin A1C: 5.5 % (ref 4.0–5.6)

## 2021-06-21 MED ORDER — FLUTICASONE PROPIONATE 50 MCG/ACT NA SUSP: NASAL | 3 refills | Status: DC

## 2021-06-21 MED ORDER — BUSPIRONE HCL 10 MG PO TABS: 10.0000 mg | ORAL_TABLET | Freq: Two times a day (BID) | ORAL | 3 refills | Status: DC

## 2021-06-21 MED ORDER — CARVEDILOL 6.25 MG PO TABS: 6.2500 mg | ORAL_TABLET | Freq: Two times a day (BID) | ORAL | 3 refills | Status: DC

## 2021-06-21 MED ORDER — PREDNISONE 10 MG (21) PO TBPK: ORAL_TABLET | ORAL | 0 refills | Status: DC

## 2021-06-21 MED ORDER — GABAPENTIN 400 MG PO CAPS: 400.0000 mg | ORAL_CAPSULE | Freq: Two times a day (BID) | ORAL | 3 refills | Status: DC

## 2021-06-21 NOTE — Progress Notes (Signed)
[facility] Patient [facility] [address] [place], [place]  [address] Phone:  [phone]   Fax:  [phone]   Established Patient Office Visit  Subjective:  Patient ID: Kenneth Casey, male    DOB: [birth year]  Age: 65 y.o. MRN: [id number]  CC:  Chief Complaint  Patient presents with   Follow-up    6 month follow up; bronchitis.     HPI Kenneth Casey presents for follow up. He  has a past medical history of COPD (chronic obstructive pulmonary disease) (HCC), Obese, and Pneumonia.   He reports that he has some chest congestion. He denies any fever, chills or chest pain. He continues with his inhalers. He remains on oxygen 2 litters. He uses Flonase daily.  He reports that he was out side of Friday and got wet. He feels like this was his first bout this year.  He has had the prednisone in the past which was effective..   Past Medical History:  Diagnosis Date   COPD (chronic obstructive pulmonary disease) (HCC)    Obese    Pneumonia     Past Surgical History:  Procedure Laterality Date   CHOLECYSTECTOMY      Family History  Problem Relation Age of Onset   Cancer Mother        breast cancer, lung cancer   Diabetes Father    Cancer Maternal Aunt    Heart disease Paternal Uncle     Social History   Socioeconomic History   Marital status: Married    Spouse name: Not on file   Number of children: Not on file   Years of education: Not on file   Highest education level: Not on file  Occupational History   Not on file  Tobacco Use   Smoking status: Former    Packs/day: 2.00    Years: 30.00    Pack years: 60.00    Types: Cigarettes    Quit date: 01/08/2011    Years since quitting: 10.4   Smokeless tobacco: Never  Vaping Use   Vaping Use: Former  Substance and Sexual Activity   Alcohol use: No    Alcohol/week: 0.0 standard drinks   Drug use: No   Sexual activity: Not on file  Other Topics Concern   Not on file  Social History Narrative   Not on file    Social Determinants of Health   Financial Resource Strain: Not on file  Food Insecurity: Not on file  Transportation Needs: Not on file  Physical Activity: Not on file  Stress: Not on file  Social Connections: Not on file  Intimate Partner Violence: Not on file    Outpatient Medications Prior to Visit  Medication Sig Dispense Refill   acetaminophen (TYLENOL) 500 MG tablet Take 1,000 mg by mouth every 6 (six) hours as needed for moderate pain.     albuterol (PROVENTIL) (2.5 MG/3ML) 0.083% nebulizer solution USE 1 VIAL IN NEBULIZER EVERY 4 HOURS AS NEEDED FOR WHEEZING FOR SHORTNESS OF BREATH 375 mL 0   ALPRAZolam (XANAX) 0.25 MG tablet TAKE 1 TABLET BY MOUTH AT BEDTIME AS NEEDED FOR ANXIETY 28 tablet 0   dextromethorphan-guaiFENesin (MUCINEX DM) 30-600 MG 12hr tablet Take 1 tablet by mouth 2 (two) times daily. 30 tablet 1   naproxen (NAPROSYN) 500 MG tablet TAKE 1 TABLET BY MOUTH TWICE DAILY WITH A MEAL 60 tablet 0   OXYGEN 2 with sleep and exertion     PROAIR HFA 108 (90 Base) MCG/ACT inhaler  INHALE 2 PUFFS BY MOUTH EVERY 6 HOURS AS NEEDED FOR WHEEZING FOR SHORTNESS OF BREATH 18 g 0   SYMBICORT 160-4.5 MCG/ACT inhaler INHALE 2 PUFFS BY MOUTH FIRST THING IN THE MORNING AND THEN ANOTHER 2 PUFFS ABOUT 12 HOURS LATER 10.2 g 11   tamsulosin (FLOMAX) 0.4 MG CAPS capsule Take 0.4 mg by mouth daily.  11   busPIRone (BUSPAR) 10 MG tablet Take 1 tablet (10 mg total) by mouth 2 (two) times daily. 180 tablet 3   carvedilol (COREG) 6.25 MG tablet Take 1 tablet (6.25 mg total) by mouth 2 (two) times daily with a meal. 180 tablet 3   fluticasone (FLONASE) 50 MCG/ACT nasal spray USE 2 SPRAY(S) IN EACH NOSTRIL ONCE DAILY AS NEEDED FOR ALLERGIES 16 g 3   gabapentin (NEURONTIN) 400 MG capsule Take 1 capsule (400 mg total) by mouth 2 (two) times daily. 180 capsule 3   furosemide (LASIX) 40 MG tablet Take 40 mg by mouth daily as needed. (Patient not taking: Reported on 12/21/2020)     No  facility-administered medications prior to visit.    Allergies  Allergen Reactions   Adhesive [Tape] Rash    ROS Review of Systems    Objective:    Physical Exam Constitutional:      General: He is not in acute distress.    Appearance: He is obese. He is not ill-appearing, toxic-appearing or diaphoretic.  HENT:     Head: Normocephalic and atraumatic.     Nose: Nose normal.     Mouth/Throat:     Mouth: Mucous membranes are moist.  Cardiovascular:     Rate and Rhythm: Normal rate and regular rhythm.     Pulses: Normal pulses.  Pulmonary:     Breath sounds: No wheezing.     Comments: Coarse BS to bronchioles  Lung sound diminshed Abdominal:     Palpations: Abdomen is soft.     Comments: Increased abdominal girth   Musculoskeletal:     Right lower leg: No edema.     Left lower leg: No edema.  Skin:    General: Skin is warm.     Capillary Refill: Capillary refill takes less than 2 seconds.  Neurological:     General: No focal deficit present.     Mental Status: He is alert and oriented to person, place, and time.  Psychiatric:        Mood and Affect: Mood normal.        Behavior: Behavior normal.        Thought Content: Thought content normal.        Judgment: Judgment normal.    BP (!) 159/71 (BP Location: Right Arm, Patient Position: Sitting)   Pulse 75   Temp 98.6 F (37 C)   Ht 5\' 9"  (1.753 m)   Wt 270 lb 0.6 oz (122.5 kg)   SpO2 95%   BMI 39.88 kg/m  Wt Readings from Last 3 Encounters:  06/21/21 270 lb 0.6 oz (122.5 kg)  12/21/20 270 lb (122.5 kg)  07/14/20 253 lb (114.8 kg)     Health Maintenance Due  Topic Date Due   URINE MICROALBUMIN  07/14/2021    There are no preventive care reminders to display for this patient.  Lab Results  Component Value Date   TSH 2.564 12/08/2015   Lab Results  Component Value Date   WBC 12.0 (H) 01/13/2020   HGB 16.1 01/13/2020   HCT 47.9 01/13/2020   MCV 85 01/13/2020  PLT 302 01/13/2020   Lab  Results  Component Value Date   NA 142 12/21/2020   K 4.7 12/21/2020   CO2 24 01/14/2019   GLUCOSE 99 12/21/2020   BUN 24 12/21/2020   CREATININE 0.95 12/21/2020   BILITOT 0.3 12/21/2020   ALKPHOS 77 12/21/2020   AST 17 12/21/2020   ALT 16 01/14/2019   PROT 7.0 12/21/2020   ALBUMIN 4.5 12/21/2020   CALCIUM 9.2 12/21/2020   ANIONGAP 7 12/14/2015   Lab Results  Component Value Date   CHOL 163 12/21/2020   Lab Results  Component Value Date   HDL 35 (L) 12/21/2020   Lab Results  Component Value Date   LDLCALC 117 (H) 12/21/2020   Lab Results  Component Value Date   TRIG 55 12/21/2020   Lab Results  Component Value Date   CHOLHDL 4.7 12/21/2020   Lab Results  Component Value Date   HGBA1C 5.5 06/21/2021      Assessment & Plan:   Problem List Items Addressed This Visit       Cardiovascular and Mediastinum   Chronic diastolic CHF (congestive heart failure) (HCC) Stable    Relevant Medications   carvedilol (COREG) 6.25 MG tablet   Other Visit Diagnoses     Essential hypertension    -  Primary Encouraged on going compliance with current medication regimen Encouraged home monitoring and recording BP <130/80 Eating a heart-healthy diet with less salt Encouraged regular physical activity  Recommend Weight loss   Relevant Medications   carvedilol (COREG) 6.25 MG tablet   Other Relevant Orders   Comp. Metabolic Panel (12)   COPD with acute bronchitis (HCC)       Relevant Medications   predniSONE (STERAPRED UNI-PAK 21 TAB) 10 MG (21) TBPK tablet   fluticasone (FLONASE) 50 MCG/ACT nasal spray   Other Relevant Orders   CBC with Differential/Platelet   Screening for cholesterol level       Relevant Orders   Lipid panel   Prediabetes     Stable  Consider home glucose monitoring Weight loss at least 5% of current body weight is can be achieved with lifestyle modification dietary changes and regular daily exercise Encourage blood pressure control goal <120/80  and maintaining total cholesterol <200 Follow-up every 3 to 6 months for reevaluation Education material provided    Relevant Orders   HgB A1c (Completed)   Microalbumin, urine   Generalized anxiety disorder       Relevant Medications   busPIRone (BUSPAR) 10 MG tablet   Seasonal allergic rhinitis, unspecified trigger       Relevant Medications   fluticasone (FLONASE) 50 MCG/ACT nasal spray   Neuropathy       Relevant Medications   gabapentin (NEURONTIN) 400 MG capsule       Meds ordered this encounter  Medications   predniSONE (STERAPRED UNI-PAK 21 TAB) 10 MG (21) TBPK tablet    Sig: Per does pack    Dispense:  21 tablet    Refill:  0    Order Specific Question:   Supervising Provider    Answer:   [name], [name] [[id number]]   busPIRone (BUSPAR) 10 MG tablet    Sig: Take 1 tablet (10 mg total) by mouth 2 (two) times daily.    Dispense:  180 tablet    Refill:  3    Order Specific Question:   Supervising Provider    Answer:   [name], [name] [[id number]]   carvedilol (COREG) 6.25 MG  tablet    Sig: Take 1 tablet (6.25 mg total) by mouth 2 (two) times daily with a meal.    Dispense:  180 tablet    Refill:  3    Order Specific Question:   Supervising Provider    Answer:   Quentin Angst [[id number]]   fluticasone (FLONASE) 50 MCG/ACT nasal spray    Sig: USE 2 SPRAY(S) IN EACH NOSTRIL ONCE DAILY AS NEEDED FOR ALLERGIES    Dispense:  16 g    Refill:  3    Order Specific Question:   Supervising Provider    Answer:   Quentin Angst [[id number]]   gabapentin (NEURONTIN) 400 MG capsule    Sig: Take 1 capsule (400 mg total) by mouth 2 (two) times daily.    Dispense:  180 capsule    Refill:  3    Order Specific Question:   Supervising Provider    Answer:   Quentin Angst [id number]     Follow-up: Return in about 6 months (around 12/22/2021) for Follow up HTN [id number].    Barbette Merino, NP

## 2021-06-21 NOTE — Patient Instructions (Addendum)
[email or web address]">  Chronic Bronchitis, Adult  Chronic bronchitis is inflammation of the large airways (bronchial tubes) that carry air to the lungs. The inflammation causes more mucus (sputum) to build up. The inflammation and mucus make it hard to breathe. Thiscondition is a type of chronic obstructive pulmonary disease (COPD). Chronic bronchitis is a long-term (chronic) condition. It is defined as a chronic cough with sputum production: For at least 3 months of the year. For 2 years in a row. People with chronic bronchitis are more likely to get colds and otherinfections in the nose, throat, or airways. What are the causes? This condition is most often caused by: A history of smoking. Exposure to secondhand smoke or a smoky area for a long period of time. Lung problems, such as emphysema, asthma, bronchiectasis, or cystic fibrosis. Long-term exposure to certain fumes or chemicals that irritate the lungs. Infection. What are the signs or symptoms? Symptoms of chronic bronchitis may include: A cough that brings up mucus (productive cough). Shortness of breath. A whistling sound when you breathe (wheezing). Chest tightness. Colds or respiratory infections that go away and return. How is this diagnosed? This condition may be diagnosed based on: Your symptoms and medical history. A physical exam. Tests, such as: Testing a sputum sample. Blood tests. A chest X-ray. Tests of lung (pulmonary) function. How is this treated? There is no cure for chronic bronchitis. Treatment may help control your symptoms. This includes: Drinking fluids. This may help thin your mucus so it is easier to cough up. Mucus-clearing techniques. Taking medicine prescribed by your health care provider, such as: Inhaled medicine to improve airflow in and out of your lungs. Antibiotics to treat or prevent bacterial lung infections. Using oxygen therapy, if your blood oxygen level is  very low. Pulmonary rehabilitation. This is a program that helps you learn how to manage your breathing problem. The program may include exercise, education, counseling, treatment, and support. Follow these instructions at home:  Medicines Take over-the-counter and prescription medicines only as told by your health care provider. If you were prescribed an antibiotic medicine, take it as told by your health care provider. Do not stop taking the antibiotic even if you start to feel better. Lifestyle  Do not use any products that contain nicotine or tobacco, such as cigarettes, e-cigarettes, and chewing tobacco. If you need help quitting, ask your health care provider. Stay away from other people's smoke (secondhand smoke) and any irritants that make you cough more, such as chemical fumes. Eat a healthy diet and get regular exercise. Talk with your health care provider about what activities are safe for you.  Preventing infections Stay up to date on all immunizations, including the pneumonia and flu vaccines. Wash your hands often with soap and water for at least 20 seconds. If soap and water are not available, use hand sanitizer. Avoid contact with people who have symptoms of a cold or the flu. General instructions Drink enough fluids to keep your urine pale yellow. Use oxygen therapy at home as directed. Follow instructions from your health care provider about how to use oxygen safely and take steps to prevent fire. Do not smoke while using oxygen or allow others to smoke in your home. Keep all follow-up visits as told by your health care provider. This is important. Contact a health care provider if: Your shortness of breath or coughing gets worse even when you take medicine. Your mucus gets thicker or changes color. You are not able to cough up your  mucus. You have a fever. Get help right away if: You have trouble breathing. You have chest pain. You feel dizzy or confused. These  symptoms may represent a serious problem that is an emergency. Do not wait to see if the symptoms will go away. Get medical help right away. Call your local emergency services (911 in the [place] Do not drive yourself to the hospital. Summary Chronic bronchitis is inflammation of the large airways (bronchial tubes) that carry air to the lungs. The inflammation causes mucus (sputum) to build up. The inflammation and mucus make it hard to breathe. If you were prescribed an antibiotic medicine, take it as told by your health care provider. Do not stop taking the antibiotic even if you start to feel better. Drink enough fluids to keep your urine pale yellow. Drinking fluids may help thin your mucus so it is easier to cough up. Do not use any products that contain nicotine or tobacco, such as cigarettes, e-cigarettes, and chewing tobacco. If you need help quitting, ask your health care provider. This information is not intended to replace advice given to you by your health care provider. Make sure you discuss any questions you have with your healthcare provider. Document Revised: 01/11/2020 Document Reviewed: 01/12/2020 Elsevier Patient Education  2022 [employer] Inc.  Hypertension, Adult Hypertension is another name for high blood pressure. High blood pressure forces your heart to work harder to pump blood. This can cause problems overtime. There are two numbers in a blood pressure reading. There is a top number (systolic) over a bottom number (diastolic). It is best to have a blood pressure that is below 120/80. Healthy choicescan help lower your blood pressure, or you may need medicine to help lower it. What are the causes? The cause of this condition is not known. Some conditions may be related tohigh blood pressure. What increases the risk? Smoking. Having type 2 diabetes mellitus, high cholesterol, or both. Not getting enough exercise or physical activity. Being overweight. Having too much fat,  sugar, calories, or salt (sodium) in your diet. Drinking too much alcohol. Having long-term (chronic) kidney disease. Having a family history of high blood pressure. Age. Risk increases with age. Race. You may be at higher risk if you are African American. Gender. Men are at higher risk than women before age 20. After age 52, women are at higher risk than men. Having obstructive sleep apnea. Stress. What are the signs or symptoms? High blood pressure may not cause symptoms. Very high blood pressure (hypertensive crisis) may cause: Headache. Feelings of worry or nervousness (anxiety). Shortness of breath. Nosebleed. A feeling of being sick to your stomach (nausea). Throwing up (vomiting). Changes in how you see. Very bad chest pain. Seizures. How is this treated? This condition is treated by making healthy lifestyle changes, such as: Eating healthy foods. Exercising more. Drinking less alcohol. Your health care provider may prescribe medicine if lifestyle changes are not enough to get your blood pressure under control, and if: Your top number is above 130. Your bottom number is above 80. Your personal target blood pressure may vary. Follow these instructions at home: Eating and drinking  If told, follow the DASH eating plan. To follow this plan: Fill one half of your plate at each meal with fruits and vegetables. Fill one fourth of your plate at each meal with whole grains. Whole grains include whole-wheat pasta, brown rice, and whole-grain bread. Eat or drink low-fat dairy products, such as skim milk or low-fat yogurt. Fill  one fourth of your plate at each meal with low-fat (lean) proteins. Low-fat proteins include fish, chicken without skin, eggs, beans, and tofu. Avoid fatty meat, cured and processed meat, or chicken with skin. Avoid pre-made or processed food. Eat less than 1,500 mg of salt each day. Do not drink alcohol if: Your doctor tells you not to drink. You are  pregnant, may be pregnant, or are planning to become pregnant. If you drink alcohol: Limit how much you use to: 0-1 drink a day for women. 0-2 drinks a day for men. Be aware of how much alcohol is in your drink. In the [place] one drink equals one 12 oz bottle of beer (355 mL), one 5 oz glass of wine (148 mL), or one 1 oz glass of hard liquor (44 mL).  Lifestyle  Work with your doctor to stay at a healthy weight or to lose weight. Ask your doctor what the best weight is for you. Get at least 30 minutes of exercise most days of the week. This may include walking, swimming, or biking. Get at least 30 minutes of exercise that strengthens your muscles (resistance exercise) at least 3 days a week. This may include lifting weights or doing Pilates. Do not use any products that contain nicotine or tobacco, such as cigarettes, e-cigarettes, and chewing tobacco. If you need help quitting, ask your doctor. Check your blood pressure at home as told by your doctor. Keep all follow-up visits as told by your doctor. This is important.  Medicines Take over-the-counter and prescription medicines only as told by your doctor. Follow directions carefully. Do not skip doses of blood pressure medicine. The medicine does not work as well if you skip doses. Skipping doses also puts you at risk for problems. Ask your doctor about side effects or reactions to medicines that you should watch for. Contact a doctor if you: Think you are having a reaction to the medicine you are taking. Have headaches that keep coming back (recurring). Feel dizzy. Have swelling in your ankles. Have trouble with your vision. Get help right away if you: Get a very bad headache. Start to feel mixed up (confused). Feel weak or numb. Feel faint. Have very bad pain in your: Chest. Belly (abdomen). Throw up more than once. Have trouble breathing. Summary Hypertension is another name for high blood pressure. High blood pressure  forces your heart to work harder to pump blood. For most people, a normal blood pressure is less than 120/80. Making healthy choices can help lower blood pressure. If your blood pressure does not get lower with healthy choices, you may need to take medicine. This information is not intended to replace advice given to you by your health care provider. Make sure you discuss any questions you have with your healthcare provider. Document Revised: 08/06/2018 Document Reviewed: 08/06/2018 Elsevier Patient Education  2022 [employer].

## 2021-06-22 LAB — COMP. METABOLIC PANEL (12)
AST: 14 IU/L (ref 0–40)
Albumin/Globulin Ratio: 1.8 (ref 1.2–2.2)
Albumin: 4.3 g/dL (ref 3.8–4.8)
Alkaline Phosphatase: 77 IU/L (ref 44–121)
BUN/Creatinine Ratio: 18 (ref 10–24)
BUN: 15 mg/dL (ref 8–27)
Bilirubin Total: 0.4 mg/dL (ref 0.0–1.2)
Calcium: 9 mg/dL (ref 8.6–10.2)
Chloride: 102 mmol/L (ref 96–106)
Creatinine, Ser: 0.84 mg/dL (ref 0.76–1.27)
Globulin, Total: 2.4 g/dL (ref 1.5–4.5)
Glucose: 90 mg/dL (ref 65–99)
Potassium: 4.9 mmol/L (ref 3.5–5.2)
Sodium: 141 mmol/L (ref 134–144)
Total Protein: 6.7 g/dL (ref 6.0–8.5)
eGFR: 99 mL/min/{1.73_m2} (ref >59)

## 2021-06-22 LAB — CBC WITH DIFFERENTIAL/PLATELET
Basophils Absolute: 0.1 10*3/uL (ref 0.0–0.2)
Basos: 1 %
EOS (ABSOLUTE): 0.3 10*3/uL (ref 0.0–0.4)
Eos: 4 %
Hematocrit: 48.3 % (ref 37.5–51.0)
Hemoglobin: 15.6 g/dL (ref 13.0–17.7)
Immature Grans (Abs): 0 10*3/uL (ref 0.0–0.1)
Immature Granulocytes: 0 %
Lymphocytes Absolute: 1 10*3/uL (ref 0.7–3.1)
Lymphs: 14 %
MCH: 28.3 pg (ref 26.6–33.0)
MCHC: 32.3 g/dL (ref 31.5–35.7)
MCV: 88 fL (ref 79–97)
Monocytes Absolute: 0.8 10*3/uL (ref 0.1–0.9)
Monocytes: 11 %
Neutrophils Absolute: 4.6 10*3/uL (ref 1.4–7.0)
Neutrophils: 70 %
Platelets: 262 10*3/uL (ref 150–450)
RBC: 5.52 x10E6/uL (ref 4.14–5.80)
RDW: 13.1 % (ref 11.6–15.4)
WBC: 6.7 10*3/uL (ref 3.4–10.8)

## 2021-06-22 LAB — LIPID PANEL
Chol/HDL Ratio: 4.4 ratio (ref 0.0–5.0)
Cholesterol, Total: 144 mg/dL (ref 100–199)
HDL: 33 mg/dL — ABNORMAL LOW (ref >39)
LDL Chol Calc (NIH): 98 mg/dL (ref 0–99)
Triglycerides: 61 mg/dL (ref 0–149)
VLDL Cholesterol Cal: 13 mg/dL (ref 5–40)

## 2021-06-22 LAB — MICROALBUMIN, URINE: Microalbumin, Urine: 12.1 ug/mL

## 2021-06-30 ENCOUNTER — Other Ambulatory Visit: Payer: Self-pay | Admitting: Internal Medicine

## 2021-06-30 ENCOUNTER — Other Ambulatory Visit: Payer: Self-pay | Admitting: Nurse Practitioner

## 2021-06-30 DIAGNOSIS — N451 Epididymitis: Secondary | ICD-10-CM

## 2021-07-10 DIAGNOSIS — J9611 Chronic respiratory failure with hypoxia: Secondary | ICD-10-CM | POA: Diagnosis not present

## 2021-07-10 DIAGNOSIS — J449 Chronic obstructive pulmonary disease, unspecified: Secondary | ICD-10-CM | POA: Diagnosis not present

## 2021-07-14 ENCOUNTER — Encounter: Payer: Self-pay | Admitting: Internal Medicine

## 2021-07-14 ENCOUNTER — Ambulatory Visit (INDEPENDENT_AMBULATORY_CARE_PROVIDER_SITE_OTHER): Payer: Medicaid Other | Admitting: Internal Medicine

## 2021-07-14 ENCOUNTER — Other Ambulatory Visit: Payer: Self-pay

## 2021-07-14 ENCOUNTER — Ambulatory Visit (INDEPENDENT_AMBULATORY_CARE_PROVIDER_SITE_OTHER): Payer: Medicaid Other

## 2021-07-14 DIAGNOSIS — R059 Cough, unspecified: Secondary | ICD-10-CM | POA: Diagnosis not present

## 2021-07-14 DIAGNOSIS — J9612 Chronic respiratory failure with hypercapnia: Secondary | ICD-10-CM

## 2021-07-14 DIAGNOSIS — J9611 Chronic respiratory failure with hypoxia: Secondary | ICD-10-CM

## 2021-07-14 DIAGNOSIS — J449 Chronic obstructive pulmonary disease, unspecified: Secondary | ICD-10-CM

## 2021-07-14 MED ORDER — AZITHROMYCIN 250 MG PO TABS: ORAL_TABLET | ORAL | 0 refills | Status: DC

## 2021-07-14 NOTE — Progress Notes (Signed)
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     History of Present Illness  12/21/2015 1st [facility] office visit/ Velina Drollinger   Chief Complaint  Patient presents with   HFU    Pt states that his breathing has improved some, but not baseline for him. He c/o chest congestion and has had some cough with clear sputum. He has been wheezing some. He is using albuterol inhaler 4-5 x per day and neb 2-4 x per day.   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  Work on 09/13/2018:  Plan B = Backup Only use your albuterol as a rescue medication  Plan C = Crisis - only use your albuterol nebulizer if you first try Plan B   Daytime goal is to keep 02 sats above 90% at all times > adjust your 02 to get to that level Congratulations on weight loss  Please schedule a follow up visit in 3 months but call sooner if needed     07/14/2020  f/u ov/Taiven Greenley re: copd II / obesity /maint symb 160 2bid / limited by L leg pain/ weak   Chief Complaint  Patient presents with   Follow-up    Breathing is overall doing well. He states having some cough and congestion when it's humid out- brings up clear sputum in the am's. He is using albuterol inhaler 2 x per day and uses neb maybe 3 x per month.   Dyspnea:  Limited by knee/hip numbness painful paresthesias  Cough: about the same/ assoc with pnds  Sleeping: recliner x 45 degees SABA use: twice daily saba p ex, never pre or rechallenges 02:  2lpm hs and prn  Rec Make sure you check your oxygen saturations at highest level of activity  Please schedule a follow up visit in 12  months but call sooner if needed    07/14/2021  f/u ov/Mahika Vanvoorhis re: GOLD 2copd / obesity  maint on symbicort 160 and prn   Chief Complaint  Patient presents with   Follow-up    F/U COPD. Recently completed prednisone taper for Bronchitis. Using albuterol inhaler once daily and albuterol neb once daily. Uses oxygen at night 2L. Covid vaccine x3.  Dyspnea:  able to weed eat  Cough: still yellow  Sleeping: recliner 45 degrees x years comfortably  SABA use: less now that took predisone  02: 2lpm  Covid status:   vax x 3    No obvious day to day or daytime variability or assoc  mucus plugs or hemoptysis or cp or chest tightness, subjective wheeze or overt sinus or hb symptoms.   Sleeping as above  without nocturnal  or early am exacerbation  of respiratory  c/o's or need for noct saba. Also denies any obvious fluctuation  of symptoms with weather or environmental changes or other aggravating or alleviating factors except as outlined above   No unusual exposure hx or h/o childhood pna/ asthma or knowledge of premature birth.  Current Allergies, Complete Past Medical History, Past Surgical History, Family History, and Social History were reviewed in [employer] record.  ROS  The following are not active complaints unless bolded Hoarseness, sore throat, dysphagia, dental problems, itching, sneezing,  nasal congestion or discharge of excess mucus or purulent secretions, ear ache,   fever, chills, sweats, unintended wt loss or wt gain, classically pleuritic or exertional cp,  orthopnea pnd or arm/hand swelling  or leg swelling, presyncope, palpitations, abdominal pain, anorexia, nausea, vomiting, diarrhea  or change in bowel habits or change in bladder habits, change in stools or change in urine, dysuria, hematuria,  rash, arthralgias, visual complaints, headache, numbness, weakness or ataxia or problems with walking or coordination,  change in mood or  memory.        Current Meds  Medication Sig   acetaminophen (TYLENOL) 500 MG tablet Take 1,000 mg by mouth every 6 (six) hours as needed for moderate pain.   albuterol (PROVENTIL) (2.5 MG/3ML) 0.083% nebulizer solution USE 1 VIAL IN NEBULIZER EVERY 4 HOURS AS NEEDED FOR WHEEZING FOR SHORTNESS OF BREATH   ALPRAZolam (XANAX) 0.25 MG tablet TAKE 1 TABLET BY MOUTH AT BEDTIME AS NEEDED FOR ANXIETY   busPIRone (BUSPAR) 10 MG tablet Take 1 tablet (10 mg total) by mouth 2 (two) times daily.   carvedilol (COREG) 6.25 MG tablet Take 1 tablet (6.25 mg total) by mouth 2 (two) times daily with a meal.   dextromethorphan-guaiFENesin (MUCINEX DM) 30-600 MG 12hr tablet Take 1 tablet by mouth 2 (two) times daily.   fluticasone (FLONASE) 50 MCG/ACT nasal spray USE 2 SPRAY(S) IN EACH NOSTRIL ONCE DAILY AS NEEDED FOR ALLERGIES   gabapentin (NEURONTIN) 400 MG capsule  Take 1 capsule (400 mg total) by mouth 2 (two) times daily.   naproxen (NAPROSYN) 500 MG tablet TAKE 1 TABLET BY MOUTH TWICE DAILY WITH A MEAL   OXYGEN 2 with sleep and exertion   predniSONE (STERAPRED UNI-PAK 21 TAB) 10 MG (21) TBPK tablet Per does pack   PROAIR HFA 108 (90 Base) MCG/ACT inhaler INHALE 2 PUFFS BY MOUTH EVERY 6 HOURS AS NEEDED FOR WHEEZING AND FOR SHORTNESS OF BREATH   SYMBICORT  160-4.5 MCG/ACT inhaler INHALE 2 PUFFS BY MOUTH FIRST THING IN THE MORNING AND THEN ANOTHER 2 PUFFS ABOUT 12 HOURS LATER   tamsulosin (FLOMAX) 0.4 MG CAPS capsule Take 0.4 mg by mouth daily.                 Objective:   Physical Exam    07/14/2021      270 07/14/2020      253  07/15/2019     232  04/16/2016          309 >  05/28/2016  305 > 09/03/2016   318 >  02/13/2017  323 >  08/13/2017  326 > 01/13/2018  328 > 04/14/2018  312 > 07/14/2018    290 > 10/20/2018  267 > 01/17/2016          311   12/20/15 308 lb (139.708 kg)  12/13/15 315 lb (142.883 kg)  12/08/15 312 lb (141.522 kg)       Vital signs reviewed  07/14/2021  - Note at rest 02 sats  97% on RA   General appearance:    massively obese     HEENT : pt wearing mask not removed for exam due to covid - 19 concerns.   NECK :  without JVD/Nodes/TM/ nl carotid upstrokes bilaterally   LUNGS: no acc muscle use,  Min barrel  contour chest wall with bilateral distant insp/exp rhonchi  and  without cough on insp or exp maneuvers and min  Hyperresonant  to  percussion bilaterally     CV:  RRR  no s3 or murmur or increase in P2, and no edema   ABD:  massively obese soft and nontender with pos end  insp Hoover's  in the supine position. No bruits or organomegaly appreciated, bowel sounds nl  MS:   Nl gait/  ext warm without deformities, calf tenderness, cyanosis or clubbing No obvious joint restrictions   SKIN: warm and dry without lesions    NEURO:  alert, approp, nl sensorium with  no motor or cerebellar deficits apparent.          CXR PA and  Lateral:   07/14/2021 :    I personally reviewed images  / impression as follows:   Minimal increased markings R base s def as dz / CB pattern persists         Assessment:

## 2021-07-14 NOTE — Patient Instructions (Addendum)
Zpak   No change in recommendations    Please remember to go to the  x-ray department  for your tests - we will call you with the results when they are available     Please schedule a follow up visit in 12  months but call sooner if needed - ok to come to [place] office

## 2021-07-15 ENCOUNTER — Encounter: Payer: Self-pay | Admitting: Internal Medicine

## 2021-07-15 NOTE — Assessment & Plan Note (Signed)
PFTs 07/14/2018  erv =  16% at wt 290   Body mass index is 41.14 kg/m.  -  Trending up again  Lab Results  Component Value Date   TSH 2.564 12/08/2015      Contributing to doe and risk of GERD >>>   reviewed the need and the process to achieve and maintain neg calorie balance > defer f/u primary care including intermittently monitoring thyroid status

## 2021-07-15 NOTE — Assessment & Plan Note (Addendum)
Quit smoking 2012  12/20/2015  extensive coaching HFA effectiveness =    75% > continue symbicort 160 2bid - Spirometry 01/17/2016  FEV1 1.62 (44%)  Ratio 61 - 05/28/2016   try bevespi 2bid > improved 09/03/2016 but decided liked symbicort better = 80 bid> increased to 160 2bid  08/13/17  - 04/14/2018  After extensive coaching inhaler device  effectiveness =   90% p 4 tries  - 07/14/2018  After extensive coaching inhaler device  effectiveness =    90%  - PFT's  07/14/2018  FEV1 1.80 (57 % ) ratio 60  p 9 % improvement from saba p nothing prior to study with DLCO  94 % corrects to 121 % for alv volume   And erv 16% @ 290 lbs  S/p mild flare rx with pred still discolored mucus c/w CB so rec: zpak.    Group D in terms of symptom/risk and laba/lama/ICS  therefore appropriate rx at this point >>> did not like the effects of bevespi (likely the lama component ) so remains on symicort 160 2bid  With approp saba:  I spent extra time with pt today reviewing appropriate use of albuterol for prn use on exertion with the following points: 1) saba is for relief of sob that does not improve by walking a slower pace or resting but rather if the pt does not improve after trying this first. 2) If the pt is convinced, as many are, that saba helps recover from activity faster then it's easy to tell if this is the case by re-challenging : ie stop, take the inhaler, then p 5 minutes try the exact same activity (intensity of workload) that just caused the symptoms and see if they are substantially diminished or not after saba 3) if there is an activity that reproducibly causes the symptoms, try the saba 15 min before the activity on alternate days   If in fact the saba really does help, then fine to continue to use it prn but advised may need to look closer at the maintenance regimen being used to achieve better control of airways disease with exertion.

## 2021-07-15 NOTE — Assessment & Plan Note (Signed)
On 02 3lpm since d/c 11/18/2015 - HCO3  34  12/14/15  - 01/17/2016 sats mid 90's RA at rest so rec 2lpm sleeping / exerting but none needed at rest  - 04/16/2016  Walked 4lpm pulsed x 3 laps @ 185 ft each stopped due to end of study, mild sob, no desat  - 08/13/2017  Walked RA x 3 laps @ 185 ft each stopped due to  End of study, nl to mod fast pace, no desat , min sob   - 01/13/2018  Walked RA x 3 laps @ 185 ft each stopped due to  End of study, nl pace, no sob or desat   - HCO3  01/13/2018  =  30 c/w only mild hypercarbia   - ONO RA  04/01/18 desat x 5 h and 20 min so needs 2lpm   - 04/14/2018  Walked RA x 3 laps @ 185 ft each stopped due to  End of study, sats 88% corrected on 2lpm  - 04/22/18  ono on 3lpm = 1h 44 min  So rec 4lpm and repeat on 4lpm > done 05/01/18  only 12 min sat @ < 89% so no change rx  - 10/20/2018 pt titrated down to 2lpm with wt loss and "sleeps great"   Reminded: Make sure you check your oxygen saturation  at your highest level of activity  to be sure it stays over 90% and adjust  02 flow upward to maintain this level if needed but remember to turn it back to previous settings when you stop (to conserve your supply).          Each maintenance medication was reviewed in detail including emphasizing most importantly the difference between maintenance and prns and under what circumstances the prns are to be triggered using an action plan format where appropriate.  Total time for H and P, chart review, counseling, reviewing hfa/ neb and 02  device(s) and generating customized AVS unique to this office visit / same day charting = 22 min

## 2021-07-17 ENCOUNTER — Encounter: Payer: Self-pay | Admitting: *Deleted

## 2021-07-19 ENCOUNTER — Other Ambulatory Visit: Payer: Self-pay | Admitting: Nurse Practitioner

## 2021-07-19 DIAGNOSIS — F411 Generalized anxiety disorder: Secondary | ICD-10-CM

## 2021-07-21 ENCOUNTER — Telehealth: Payer: Self-pay | Admitting: Internal Medicine

## 2021-07-21 MED ORDER — AMOXICILLIN-POT CLAVULANATE 875-125 MG PO TABS: 1.0000 | ORAL_TABLET | Freq: Two times a day (BID) | ORAL | 0 refills | Status: AC

## 2021-07-21 NOTE — Telephone Encounter (Signed)
Cough productive yellow mucus p zpak/no sob over baseline  Rec Augmentin x 10 days and mucinex dm 1200 mg every 12 hours as need

## 2021-07-21 NOTE — Telephone Encounter (Signed)
Spoke with pt who states he has finished Z-pak but is still feeling somewhat congested. Pt states Dr. Sherene Sires mentioned maybe starting prednisone but pt is concerned that prednisone may interfere with having a tooth pulled on Thursday. Dr. Sherene Sires please advise.

## 2021-07-24 DIAGNOSIS — J449 Chronic obstructive pulmonary disease, unspecified: Secondary | ICD-10-CM | POA: Diagnosis not present

## 2021-07-24 DIAGNOSIS — J9611 Chronic respiratory failure with hypoxia: Secondary | ICD-10-CM | POA: Diagnosis not present

## 2021-07-24 NOTE — Telephone Encounter (Signed)
Called spoke with patient Shared Dr. Thurston Hole recommendations Patient states Dr. Sherene Sires already ordered the augmentin and patient has been taking. Patient is now having stomach issues, he is taking medication with food and eating yogart.  Patient wants to stop taking medication after today.  Sending to Dr. Sherene Sires for further recommendations.

## 2021-07-24 NOTE — Telephone Encounter (Signed)
Sorry to hear that  [facility] to leave off and see how he feels then if cough comes back go ahead and try omnicef 300 bid x 7 days (similar drug in terms of coverage but very gentle on the GI tract)

## 2021-07-24 NOTE — Telephone Encounter (Signed)
Called spoke with patient.  He will stop abx and call Wednesday or Thursday if cough comes back . He does not want omnicef yet.  Nothing further needed at this time.

## 2021-07-28 ENCOUNTER — Other Ambulatory Visit: Payer: Self-pay | Admitting: Family Medicine

## 2021-07-28 DIAGNOSIS — N451 Epididymitis: Secondary | ICD-10-CM

## 2021-08-10 DIAGNOSIS — J449 Chronic obstructive pulmonary disease, unspecified: Secondary | ICD-10-CM | POA: Diagnosis not present

## 2021-08-10 DIAGNOSIS — J9611 Chronic respiratory failure with hypoxia: Secondary | ICD-10-CM | POA: Diagnosis not present

## 2021-08-23 ENCOUNTER — Other Ambulatory Visit: Payer: Self-pay | Admitting: Family Medicine

## 2021-08-23 DIAGNOSIS — N451 Epididymitis: Secondary | ICD-10-CM

## 2021-08-24 DIAGNOSIS — N401 Enlarged prostate with lower urinary tract symptoms: Secondary | ICD-10-CM | POA: Diagnosis not present

## 2021-08-24 DIAGNOSIS — R351 Nocturia: Secondary | ICD-10-CM | POA: Diagnosis not present

## 2021-08-24 DIAGNOSIS — N528 Other male erectile dysfunction: Secondary | ICD-10-CM | POA: Diagnosis not present

## 2021-08-31 DIAGNOSIS — J9611 Chronic respiratory failure with hypoxia: Secondary | ICD-10-CM | POA: Diagnosis not present

## 2021-08-31 DIAGNOSIS — J449 Chronic obstructive pulmonary disease, unspecified: Secondary | ICD-10-CM | POA: Diagnosis not present

## 2021-09-01 ENCOUNTER — Other Ambulatory Visit: Payer: Self-pay | Admitting: Internal Medicine

## 2021-09-09 DIAGNOSIS — J9611 Chronic respiratory failure with hypoxia: Secondary | ICD-10-CM | POA: Diagnosis not present

## 2021-09-09 DIAGNOSIS — J449 Chronic obstructive pulmonary disease, unspecified: Secondary | ICD-10-CM | POA: Diagnosis not present

## 2021-09-21 ENCOUNTER — Other Ambulatory Visit: Payer: Self-pay | Admitting: Internal Medicine

## 2021-09-21 DIAGNOSIS — F411 Generalized anxiety disorder: Secondary | ICD-10-CM

## 2021-09-25 ENCOUNTER — Other Ambulatory Visit: Payer: Self-pay | Admitting: Internal Medicine

## 2021-09-25 DIAGNOSIS — F411 Generalized anxiety disorder: Secondary | ICD-10-CM

## 2021-09-26 NOTE — Telephone Encounter (Signed)
Requested Prescriptions  Pending Prescriptions Disp Refills  . ALPRAZolam (XANAX) 0.25 MG tablet [Pharmacy Med Name: ALPRAZolam 0.25 MG Oral Tablet] 28 tablet 0    Sig: TAKE 1 TABLET BY MOUTH AT BEDTIME AS NEEDED FOR ANXIETY     There is no refill protocol information for this order      

## 2021-10-10 DIAGNOSIS — J449 Chronic obstructive pulmonary disease, unspecified: Secondary | ICD-10-CM | POA: Diagnosis not present

## 2021-10-10 DIAGNOSIS — J9611 Chronic respiratory failure with hypoxia: Secondary | ICD-10-CM | POA: Diagnosis not present

## 2021-10-23 ENCOUNTER — Other Ambulatory Visit: Payer: Self-pay | Admitting: Family Medicine

## 2021-10-23 ENCOUNTER — Other Ambulatory Visit: Payer: Self-pay | Admitting: Internal Medicine

## 2021-10-23 DIAGNOSIS — N451 Epididymitis: Secondary | ICD-10-CM

## 2021-10-23 DIAGNOSIS — F411 Generalized anxiety disorder: Secondary | ICD-10-CM

## 2021-10-23 NOTE — Telephone Encounter (Signed)
Requested Prescriptions  Pending Prescriptions Disp Refills  . ALPRAZolam (XANAX) 0.25 MG tablet [Pharmacy Med Name: ALPRAZolam 0.25 MG Oral Tablet] 28 tablet 0    Sig: TAKE 1 TABLET BY MOUTH AT BEDTIME AS NEEDED FOR ANXIETY     There is no refill protocol information for this order

## 2021-10-24 ENCOUNTER — Telehealth: Payer: Self-pay | Admitting: Internal Medicine

## 2021-10-24 MED ORDER — ALBUTEROL SULFATE HFA 108 (90 BASE) MCG/ACT IN AERS: 1.0000 | INHALATION_SPRAY | Freq: Four times a day (QID) | RESPIRATORY_TRACT | 5 refills | Status: DC | PRN

## 2021-10-24 NOTE — Telephone Encounter (Signed)
MW please advise if okay to switch from Pro air to Ventolin? Will contact patient. Thanks :)

## 2021-10-24 NOTE — Telephone Encounter (Signed)
Yes same drug

## 2021-10-24 NOTE — Telephone Encounter (Signed)
This was already done this morning.  Nothing further is needed.

## 2021-10-24 NOTE — Telephone Encounter (Signed)
Pharmacy sent request that the inhaler be changed to ventolin HFA due to insurance reasons.  This has been done.

## 2021-10-26 ENCOUNTER — Telehealth: Payer: Self-pay | Admitting: Internal Medicine

## 2021-10-26 MED ORDER — OSELTAMIVIR PHOSPHATE 75 MG PO CAPS: 75.0000 mg | ORAL_CAPSULE | Freq: Every day | ORAL | 0 refills | Status: DC

## 2021-10-26 NOTE — Telephone Encounter (Signed)
Internet says standard is 75 mg daily x 7 days

## 2021-10-26 NOTE — Telephone Encounter (Signed)
Called and spoke with patient. He is aware that MW is ok with [place] sending in the Tamiflu for him. RX has sent in for him.   Nothing further needed at time of call.

## 2021-10-26 NOTE — Telephone Encounter (Signed)
Spoke with pt who states granddaughter has tested positive for the flu and pt was advised to begin Tamiflu to prevent any sickness. Pt states chronic wheezing. Pt denies fever, chills, GI upset or any other symptoms. Pt would like to prevent himself from becoming sick. Dr. Sherene Sires please advise.

## 2021-10-26 NOTE — Telephone Encounter (Signed)
Pcp usually does the flu shots and Tamiflu but if he wants rx fine with me - needs to make sure gets the flu shot though

## 2021-10-26 NOTE — Telephone Encounter (Signed)
Okay MW please advise there are several different doses of tamiflu in epic. Please review pended order to be sure this is okay. Thanks :) Once reviewed will contact patient and send in.

## 2021-11-09 DIAGNOSIS — J9611 Chronic respiratory failure with hypoxia: Secondary | ICD-10-CM | POA: Diagnosis not present

## 2021-11-09 DIAGNOSIS — J449 Chronic obstructive pulmonary disease, unspecified: Secondary | ICD-10-CM | POA: Diagnosis not present

## 2021-11-20 ENCOUNTER — Other Ambulatory Visit: Payer: Self-pay | Admitting: Nurse Practitioner

## 2021-11-20 DIAGNOSIS — J302 Other seasonal allergic rhinitis: Secondary | ICD-10-CM

## 2021-11-20 DIAGNOSIS — N451 Epididymitis: Secondary | ICD-10-CM

## 2021-12-05 ENCOUNTER — Encounter: Payer: Self-pay | Admitting: Family Medicine

## 2021-12-05 ENCOUNTER — Ambulatory Visit (INDEPENDENT_AMBULATORY_CARE_PROVIDER_SITE_OTHER): Payer: Medicaid Other | Admitting: Family Medicine

## 2021-12-05 ENCOUNTER — Other Ambulatory Visit: Payer: Self-pay

## 2021-12-05 VITALS — BP 147/79 | HR 76 | Temp 99.1°F | Ht 69.0 in | Wt 293.2 lb

## 2021-12-05 DIAGNOSIS — F411 Generalized anxiety disorder: Secondary | ICD-10-CM

## 2021-12-05 DIAGNOSIS — R21 Rash and other nonspecific skin eruption: Secondary | ICD-10-CM | POA: Diagnosis not present

## 2021-12-05 DIAGNOSIS — I10 Essential (primary) hypertension: Secondary | ICD-10-CM

## 2021-12-05 DIAGNOSIS — R7303 Prediabetes: Secondary | ICD-10-CM | POA: Diagnosis not present

## 2021-12-05 DIAGNOSIS — I5032 Chronic diastolic (congestive) heart failure: Secondary | ICD-10-CM

## 2021-12-05 LAB — POCT GLYCOSYLATED HEMOGLOBIN (HGB A1C)
HbA1c POC (<> result, manual entry): 6 % (ref 4.0–5.6)
HbA1c, POC (controlled diabetic range): 6 % (ref 0.0–7.0)
HbA1c, POC (prediabetic range): 6 % (ref 5.7–6.4)
Hemoglobin A1C: 6 % — AB (ref 4.0–5.6)

## 2021-12-05 MED ORDER — ALPRAZOLAM 0.25 MG PO TABS: ORAL_TABLET | ORAL | 0 refills | Status: DC

## 2021-12-05 MED ORDER — TRIAMCINOLONE ACETONIDE 0.5 % EX OINT: 1.0000 "application " | TOPICAL_OINTMENT | Freq: Two times a day (BID) | CUTANEOUS | 0 refills | Status: DC

## 2021-12-05 NOTE — Patient Instructions (Addendum)
Semaglutide Injection °What is this medication? °SEMAGLUTIDE (SEM a GLOO tide) treats type 2 diabetes. It works by increasing insulin levels in your body, which decreases your blood sugar (glucose). It also reduces the amount of sugar released into the blood and slows down your digestion. It can also be used to lower the risk of heart attack and stroke in people with type 2 diabetes. Changes to diet and exercise are often combined with this medication. °This medicine may be used for other purposes; ask your health care provider or pharmacist if you have questions. °COMMON BRAND NAME(S): OZEMPIC °What should I tell my care team before I take this medication? °They need to know if you have any of these conditions: °Endocrine tumors (MEN 2) or if someone in your family had these tumors °Eye disease, vision problems °History of pancreatitis °Kidney disease °Stomach problems °Thyroid cancer or if someone in your family had thyroid cancer °An unusual or allergic reaction to semaglutide, other medications, foods, dyes, or preservatives °Pregnant or trying to get pregnant °Breast-feeding °How should I use this medication? °This medication is for injection under the skin of your upper leg (thigh), stomach area, or upper arm. It is given once every week (every 7 days). You will be taught how to prepare and give this medication. Use exactly as directed. Take your medication at regular intervals. Do not take it more often than directed. °If you use this medication with insulin, you should inject this medication and the insulin separately. Do not mix them together. Do not give the injections right next to each other. Change (rotate) injection sites with each injection. °It is important that you put your used needles and syringes in a special sharps container. Do not put them in a trash can. If you do not have a sharps container, call your pharmacist or care team to get one. °A special MedGuide will be given to you by the  pharmacist with each prescription and refill. Be sure to read this information carefully each time. °This medication comes with INSTRUCTIONS FOR USE. Ask your pharmacist for directions on how to use this medication. Read the information carefully. Talk to your pharmacist or care team if you have questions. °Talk to your care team about the use of this medication in children. Special care may be needed. °Overdosage: If you think you have taken too much of this medicine contact a poison control center or emergency room at once. °NOTE: This medicine is only for you. Do not share this medicine with others. °What if I miss a dose? °If you miss a dose, take it as soon as you can within 5 days after the missed dose. Then take your next dose at your regular weekly time. If it has been longer than 5 days after the missed dose, do not take the missed dose. Take the next dose at your regular time. Do not take double or extra doses. If you have questions about a missed dose, contact your care team for advice. °What may interact with this medication? °Other medications for diabetes °Many medications may cause changes in blood sugar, these include: °Alcohol containing beverages °Antiviral medications for HIV or AIDS °Aspirin and aspirin-like medications °Certain medications for blood pressure, heart disease, irregular heart beat °Chromium °Diuretics °Male hormones, such as estrogens or progestins, birth control pills °Fenofibrate °Gemfibrozil °Isoniazid °Lanreotide °Male hormones or anabolic steroids °MAOIs like Carbex, Eldepryl, Marplan, Nardil, and Parnate °Medications for weight loss °Medications for allergies, asthma, cold, or cough °Medications for depression,   anxiety, or psychotic disturbances °Niacin °Nicotine °NSAIDs, medications for pain and inflammation, like ibuprofen or naproxen °Octreotide °Pasireotide °Pentamidine °Phenytoin °Probenecid °Quinolone antibiotics such as ciprofloxacin, levofloxacin, ofloxacin °Some  herbal dietary supplements °Steroid medications such as prednisone or cortisone °Sulfamethoxazole; trimethoprim °Thyroid hormones °Some medications can hide the warning symptoms of low blood sugar (hypoglycemia). You may need to monitor your blood sugar more closely if you are taking one of these medications. These include: °Beta-blockers, often used for high blood pressure or heart problems (examples include atenolol, metoprolol, propranolol) °Clonidine °Guanethidine °Reserpine °This list may not describe all possible interactions. Give your health care provider a list of all the medicines, herbs, non-prescription drugs, or dietary supplements you use. Also tell them if you smoke, drink alcohol, or use illegal drugs. Some items may interact with your medicine. °What should I watch for while using this medication? °Visit your care team for regular checks on your progress. °Drink plenty of fluids while taking this medication. Check with your care team if you get an attack of severe diarrhea, nausea, and vomiting. The loss of too much body fluid can make it dangerous for you to take this medication. °A test called the HbA1C (A1C) will be monitored. This is a simple blood test. It measures your blood sugar control over the last 2 to 3 months. You will receive this test every 3 to 6 months. °Learn how to check your blood sugar. Learn the symptoms of low and high blood sugar and how to manage them. °Always carry a quick-source of sugar with you in case you have symptoms of low blood sugar. Examples include hard sugar candy or glucose tablets. Make sure others know that you can choke if you eat or drink when you develop serious symptoms of low blood sugar, such as seizures or unconsciousness. They must get medical help at once. °Tell your care team if you have high blood sugar. You might need to change the dose of your medication. If you are sick or exercising more than usual, you might need to change the dose of your  medication. °Do not skip meals. Ask your care team if you should avoid alcohol. Many nonprescription cough and cold products contain sugar or alcohol. These can affect blood sugar. °Pens should never be shared. Even if the needle is changed, sharing may result in passing of viruses like hepatitis or HIV. °Wear a medical ID bracelet or chain, and carry a card that describes your disease and details of your medication and dosage times. °Do not become pregnant while taking this medication. Women should inform their care team if they wish to become pregnant or think they might be pregnant. There is a potential for serious side effects to an unborn child. Talk to your care team for more information. °What side effects may I notice from receiving this medication? °Side effects that you should report to your care team as soon as possible: °Allergic reactions--skin rash, itching, hives, swelling of the face, lips, tongue, or throat °Change in vision °Dehydration--increased thirst, dry mouth, feeling faint or lightheaded, headache, dark yellow or brown urine °Gallbladder problems--severe stomach pain, nausea, vomiting, fever °Heart palpitations--rapid, pounding, or irregular heartbeat °Kidney injury--decrease in the amount of urine, swelling of the ankles, hands, or feet °Pancreatitis--severe stomach pain that spreads to your back or gets worse after eating or when touched, fever, nausea, vomiting °Thyroid cancer--new mass or lump in the neck, pain or trouble swallowing, trouble breathing, hoarseness °Side effects that usually do not require medical   attention (report to your care team if they continue or are bothersome): Diarrhea Loss of appetite Nausea Stomach pain Vomiting This list may not describe all possible side effects. Call your doctor for medical advice about side effects. You may report side effects to FDA at 1-800-FDA-1088. Where should I keep my medication? Keep out of the reach of children. Store  unopened pens in a refrigerator between 2 and 8 degrees C (36 and 46 degrees F). Do not freeze. Protect from light and heat. After you first use the pen, it can be stored for 56 days at room temperature between 15 and 30 degrees C (59 and 86 degrees F) or in a refrigerator. Throw away your used pen after 56 days or after the expiration date, whichever comes first. Do not store your pen with the needle attached. If the needle is left on, medication may leak from the pen. NOTE: This sheet is a summary. It may not cover all possible information. If you have questions about this medicine, talk to your doctor, pharmacist, or health care provider.  2022 Elsevier/Gold Standard (2021-03-02 00:00:00) COPD and Physical Activity Chronic obstructive pulmonary disease (COPD) is a long-term, or chronic, condition that affects the lungs. COPD is a general term that can be used to describe many problems that cause inflammation of the lungs and limit airflow. These conditions include chronic bronchitis and emphysema. The main symptom of COPD is shortness of breath, which makes it harder to do even simple tasks. This can also make it harder to exercise and stay active. Talk with your health care provider about treatments to help you breathe better and actions you can take to prevent breathing problems during physical activity. What are the benefits of exercising when you have COPD? Exercising regularly is an important part of a healthy lifestyle. You can still exercise and do physical activities even though you have COPD. Exercise and physical activity improve your shortness of breath by increasing blood flow (circulation). This causes your heart to pump more oxygen through your body. Moderate exercise can: Improve oxygen use. Increase your energy level. Help with shortness of breath. Strengthen your breathing muscles. Improve heart health. Help with sleep. Improve your self-esteem and feelings of self-worth. Lower  depression, stress, and anxiety. Exercise can benefit everyone with COPD. The severity of your disease may affect how hard you can exercise, especially at first, but everyone can benefit. Talk with your health care provider about how much exercise is safe for you, and which activities and exercises are safe for you. What actions can I take to prevent breathing problems during physical activity? Sign up for a pulmonary rehabilitation program. This type of program may include: Education about lung diseases. Exercise classes that teach you how to exercise and be more active while improving your breathing. This usually involves: Exercise using your lower extremities, such as a stationary bicycle. About 30 minutes of exercise, 2 to 5 times per week, for 6 to 12 weeks. Strength training, such as push-ups or leg lifts. Nutrition education. Group classes in which you can talk with others who also have COPD and learn ways to manage stress. If you use an oxygen tank, you should use it while you exercise. Work with your health care provider to adjust your oxygen for your physical activity. Your resting flow rate is different from your flow rate during physical activity. How to manage your breathing while exercising While you are exercising: Take slow breaths. Pace yourself, and do nottry to go too  fast. Purse your lips while breathing out. Pursing your lips is similar to a kissing or whistling position. If doing exercise that uses a quick burst of effort, such as weight lifting: Breathe in before starting the exercise. Breathe out during the hardest part of the exercise, such as raising the weights. Where to find support You can find support for exercising with COPD from: Your health care provider. A pulmonary rehabilitation program. Your local health department or community health programs. Support groups, either online or in-person. Your health care provider may be able to recommend support  groups. Where to find more information You can find more information about exercising with COPD from: American Lung Association: lung.org COPD Foundation: copdfoundation.org Contact a health care provider if: Your symptoms get worse. You have nausea. You have a fever. You want to start a new exercise program or a new activity. Get help right away if: You have chest pain. You cannot breathe. These symptoms may represent a serious problem that is an emergency. Do not wait to see if the symptoms will go away. Get medical help right away. Call your local emergency services (911 in the [place] Do not drive yourself to the hospital. Summary COPD is a general term that can be used to describe many different lung problems that cause lung inflammation and limit airflow. This includes chronic bronchitis and emphysema. Exercise and physical activity improve your shortness of breath by increasing blood flow (circulation). This causes your heart to provide more oxygen to your body. Contact your health care provider before starting any exercise program or new activity. Ask your health care provider what exercises and activities are safe for you. This information is not intended to replace advice given to you by your health care provider. Make sure you discuss any questions you have with your health care provider. Document Revised: 10/04/2020 Document Reviewed: 10/04/2020 Elsevier Patient Education  2022 [employer].

## 2021-12-05 NOTE — Progress Notes (Signed)
[facility]   Established Patient Office Visit  Subjective:  Patient ID: Kenneth Casey, male    DOB: 09-08-1956  Age: 65 y.o. MRN: [id number]  CC:  Chief Complaint  Patient presents with   Follow-up    Pt is here today for his 6 month follow up visit. Pt states that she is having some skin irritation around his mouth when his wears his oxygen mask. Pt is also having trouble sleeping.    HPI Kenneth Casey is a 65 year old male with a medical history significant for hypertension, COPD, chronic diastolic heart failure, chronic respiratory failure with hypoxia on home oxygen, and morbid obesity presents for follow-up of chronic conditions.  Patient also states that he has been having difficulty sleeping due to issues with breathing.  Patient states that he has to sleep sitting up or on multiple pillows.  He feels as if he is drowning when he lies flat.  Patient sleeps with supplemental oxygen.  He was previously on Xanax for this problem, but has been without this medication for around 3 months.  He states that he is tired during the day and "almost never" feels completely rested.  Patient also has a long history of COPD.  He follows with pulmonology around every 3 months.  He has been utilizing prescribed inhalers consistently.  He has not had COPD exacerbation in greater than a year.  He denies any shortness of breath or chest pain today.  He endorses some wheezing and coughing. Patient also has a history of hypertension.  He does not check blood pressure at home.  He has not been following a low-fat, low-carb diet or exercising.  He says that he is mostly sedentary since the weather has gotten colder.  Patient has gained approximately 23 pounds over the past several months.  He has a history of prediabetes.  He denies any headache, dizziness, polyuria, polydipsia, or polyphagia. Patient is also complaining of skin irritation around his mouth and above  his ears.  Patient states that he typically wears a mask daily.  He has not started any new products like lotions or soaps, has not traveled recently, and does not have animals.  He has not attempted any over-the-counter interventions to alleviate this problem.       Past Medical History:  Diagnosis Date   COPD (chronic obstructive pulmonary disease) (HCC)    Obese    Pneumonia     Past Surgical History:  Procedure Laterality Date   CHOLECYSTECTOMY      Family History  Problem Relation Age of Onset   Cancer Mother        breast cancer, lung cancer   Diabetes Father    Cancer Maternal Aunt    Heart disease Paternal Uncle     Social History   Socioeconomic History   Marital status: Married    Spouse name: Not on file   Number of children: Not on file   Years of education: Not on file   Highest education level: Not on file  Occupational History   Not on file  Tobacco Use   Smoking status: Former    Packs/day: 2.00    Years: 30.00    Pack years: 60.00    Types: Cigarettes    Quit date: 01/08/2011    Years since quitting: 10.9   Smokeless tobacco: Never  Vaping Use   Vaping Use: Former  Substance and Sexual Activity   Alcohol use: No  Alcohol/week: 0.0 standard drinks   Drug use: No   Sexual activity: Yes    Birth control/protection: None  Other Topics Concern   Not on file  Social History Narrative   Not on file   Social Determinants of Health   Financial Resource Strain: Not on file  Food Insecurity: Not on file  Transportation Needs: Not on file  Physical Activity: Not on file  Stress: Not on file  Social Connections: Not on file  Intimate Partner Violence: Not on file    Outpatient Medications Prior to Visit  Medication Sig Dispense Refill   acetaminophen (TYLENOL) 500 MG tablet Take 1,000 mg by mouth every 6 (six) hours as needed for moderate pain.     albuterol (PROVENTIL) (2.5 MG/3ML) 0.083% nebulizer solution USE 1 VIAL IN NEBULIZER EVERY  4 HOURS AS NEEDED FOR WHEEZING FOR SHORTNESS OF BREATH 375 mL 0   albuterol (VENTOLIN HFA) 108 (90 Base) MCG/ACT inhaler Inhale 1-2 puffs into the lungs every 6 (six) hours as needed for wheezing or shortness of breath. 18 g 5   busPIRone (BUSPAR) 10 MG tablet Take 1 tablet (10 mg total) by mouth 2 (two) times daily. 180 tablet 3   carvedilol (COREG) 6.25 MG tablet Take 1 tablet (6.25 mg total) by mouth 2 (two) times daily with a meal. 180 tablet 3   dextromethorphan-guaiFENesin (MUCINEX DM) 30-600 MG 12hr tablet Take 1 tablet by mouth 2 (two) times daily. 30 tablet 1   fluticasone (FLONASE) 50 MCG/ACT nasal spray USE 2 SPRAY(S) IN EACH NOSTRIL ONCE DAILY AS NEEDED FOR ALLERGIES 16 g 0   gabapentin (NEURONTIN) 400 MG capsule Take 1 capsule (400 mg total) by mouth 2 (two) times daily. 180 capsule 3   naproxen (NAPROSYN) 500 MG tablet TAKE 1 TABLET BY MOUTH TWICE DAILY WITH A MEAL 60 tablet 0   oseltamivir (TAMIFLU) 75 MG capsule Take 1 capsule (75 mg total) by mouth daily. 7 capsule 0   OXYGEN 2 with sleep and exertion     predniSONE (STERAPRED UNI-PAK 21 TAB) 10 MG (21) TBPK tablet Per does pack 21 tablet 0   SYMBICORT 160-4.5 MCG/ACT inhaler INHALE 2 PUFFS BY MOUTH FIRST THING IN THE MORNING AND THEN ANOTHER 2 PUFFS ABOUT 12 HOURS LATER 10.2 g 11   tamsulosin (FLOMAX) 0.4 MG CAPS capsule Take 0.4 mg by mouth daily.  11   ALPRAZolam (XANAX) 0.25 MG tablet TAKE 1 TABLET BY MOUTH AT BEDTIME AS NEEDED FOR ANXIETY (Patient not taking: Reported on 12/05/2021) 28 tablet 0   No facility-administered medications prior to visit.    Allergies  Allergen Reactions   Adhesive [Tape] Rash    ROS Review of Systems  Constitutional:  Positive for fatigue.  Eyes: Negative.   Respiratory:  Positive for cough and wheezing. Negative for shortness of breath.   Cardiovascular: Negative.  Negative for chest pain and leg swelling.  Endocrine: Negative for polydipsia, polyphagia and polyuria.  Musculoskeletal:  Negative.   Skin: Negative.   Allergic/Immunologic: Positive for environmental allergies.  Neurological: Negative.   Psychiatric/Behavioral: Negative.       Objective:    Physical Exam Constitutional:      Appearance: He is obese.  Eyes:     Pupils: Pupils are equal, round, and reactive to light.  Cardiovascular:     Rate and Rhythm: Normal rate and regular rhythm.  Pulmonary:     Effort: Pulmonary effort is normal.  Abdominal:     General: Bowel sounds are normal.  Skin:    General: Skin is warm.     Findings: Rash present.  Neurological:     General: No focal deficit present.     Mental Status: Mental status is at baseline.  Psychiatric:        Mood and Affect: Mood normal.        Behavior: Behavior normal.        Thought Content: Thought content normal.    BP (!) 150/76    Pulse 76    Temp 99.1 F (37.3 C)    Ht '5\' 9"'  (1.753 m)    Wt 293 lb 3.2 oz (133 kg)    SpO2 92%    BMI 43.30 kg/m  Wt Readings from Last 3 Encounters:  12/05/21 293 lb 3.2 oz (133 kg)  07/14/21 270 lb 9.6 oz (122.7 kg)  06/21/21 270 lb 0.6 oz (122.5 kg)     Health Maintenance Due  Topic Date Due   OPHTHALMOLOGY EXAM  Never done   COLONOSCOPY (Pts 45-66yr Insurance coverage will need to be confirmed)  Never done   Zoster Vaccines- Shingrix (1 of 2) Never done   Pneumococcal Vaccine 145621Years old (2 - PCV) 07/14/2020   COVID-19 Vaccine (3 - Booster for Moderna series) 09/30/2020   INFLUENZA VACCINE  07/10/2021   FOOT EXAM  07/14/2021    There are no preventive care reminders to display for this patient.  Lab Results  Component Value Date   TSH 2.564 12/08/2015   Lab Results  Component Value Date   WBC 6.7 06/21/2021   HGB 15.6 06/21/2021   HCT 48.3 06/21/2021   MCV 88 06/21/2021   PLT 262 06/21/2021   Lab Results  Component Value Date   NA 141 06/21/2021   K 4.9 06/21/2021   CO2 24 01/14/2019   GLUCOSE 90 06/21/2021   BUN 15 06/21/2021   CREATININE 0.84 06/21/2021    BILITOT 0.4 06/21/2021   ALKPHOS 77 06/21/2021   AST 14 06/21/2021   ALT 16 01/14/2019   PROT 6.7 06/21/2021   ALBUMIN 4.3 06/21/2021   CALCIUM 9.0 06/21/2021   ANIONGAP 7 12/14/2015   EGFR 99 06/21/2021   Lab Results  Component Value Date   CHOL 144 06/21/2021   Lab Results  Component Value Date   HDL 33 (L) 06/21/2021   Lab Results  Component Value Date   LDLCALC 98 06/21/2021   Lab Results  Component Value Date   TRIG 61 06/21/2021   Lab Results  Component Value Date   CHOLHDL 4.4 06/21/2021   Lab Results  Component Value Date   HGBA1C 5.5 06/21/2021      Assessment & Plan:   Problem List Items Addressed This Visit       Cardiovascular and Mediastinum   Chronic diastolic CHF (congestive heart failure) ([facility]   Other Visit Diagnoses     Prediabetes    -  Primary   Relevant Orders   HgB A1c   Essential hypertension       Generalized anxiety disorder         1. Prediabetes Today, patient's hemoglobin A1c is 5.8, which is increased over the past several months.  Patient advised to follow a low-fat, low carbohydrate diet divided over small meals throughout the day.  Also, low impact cardiovascular exercise such as a brisk walk would be beneficial. - HgB A1c - Comprehensive metabolic panel; Future - CBC with Differential; Future  2. Essential hypertension BP (!) 147/79 (BP Location: Left  Arm, Cuff Size: Large)    Pulse 76    Temp 99.1 F (37.3 C)    Ht '5\' 9"'  (1.753 m)    Wt 293 lb 3.2 oz (133 kg)    SpO2 92%    BMI 43.30 kg/m  - Continue medication, monitor blood pressure at home. Continue DASH diet.  Reminder to go to the ER if any CP, SOB, nausea, dizziness, severe HA, changes vision/speech, left arm numbness and tingling and jaw pain.   - CBC with Differential; Future  3. Chronic diastolic CHF (congestive heart failure) ([place]) Patient to continue to follow-up with specialist  4. Generalized anxiety disorder  - ALPRAZolam (XANAX) 0.25 MG tablet;  TAKE 1 TABLET BY MOUTH AT BEDTIME AS NEEDED FOR ANXIETY  Dispense: 30 tablet; Refill: 0  5. Rash and nonspecific skin eruption  - triamcinolone ointment (KENALOG) 0.5 %; Apply 1 application topically 2 (two) times daily.  Dispense: 30 g; Refill: 0   Follow-up: Return in about 3 months (around 03/05/2022) for hypertension.    Donia Pounds  APRN, MSN, FNP-C [facility] [address] [place], [place] [address] [phone]

## 2021-12-10 DIAGNOSIS — J9611 Chronic respiratory failure with hypoxia: Secondary | ICD-10-CM | POA: Diagnosis not present

## 2021-12-10 DIAGNOSIS — J449 Chronic obstructive pulmonary disease, unspecified: Secondary | ICD-10-CM | POA: Diagnosis not present

## 2021-12-19 ENCOUNTER — Ambulatory Visit: Payer: Medicaid Other | Admitting: Family Medicine

## 2021-12-21 ENCOUNTER — Ambulatory Visit: Payer: Medicaid Other | Admitting: Nurse Practitioner

## 2021-12-25 ENCOUNTER — Other Ambulatory Visit: Payer: Self-pay | Admitting: Internal Medicine

## 2021-12-25 ENCOUNTER — Other Ambulatory Visit: Payer: Self-pay | Admitting: Nurse Practitioner

## 2021-12-25 DIAGNOSIS — N451 Epididymitis: Secondary | ICD-10-CM

## 2022-01-10 DIAGNOSIS — J449 Chronic obstructive pulmonary disease, unspecified: Secondary | ICD-10-CM | POA: Diagnosis not present

## 2022-01-10 DIAGNOSIS — J9611 Chronic respiratory failure with hypoxia: Secondary | ICD-10-CM | POA: Diagnosis not present

## 2022-01-16 ENCOUNTER — Other Ambulatory Visit: Payer: Self-pay | Admitting: Family Medicine

## 2022-01-16 DIAGNOSIS — F411 Generalized anxiety disorder: Secondary | ICD-10-CM

## 2022-01-18 ENCOUNTER — Telehealth: Payer: Self-pay

## 2022-01-18 ENCOUNTER — Other Ambulatory Visit: Payer: Self-pay | Admitting: Family Medicine

## 2022-01-18 DIAGNOSIS — F411 Generalized anxiety disorder: Secondary | ICD-10-CM

## 2022-01-18 MED ORDER — ALPRAZOLAM 0.25 MG PO TABS: ORAL_TABLET | ORAL | 0 refills | Status: DC

## 2022-01-18 NOTE — Telephone Encounter (Signed)
Xanax refill 

## 2022-01-18 NOTE — Progress Notes (Signed)
Reviewed PDMP substance reporting system prior to prescribing opiate medications. No inconsistencies noted.  ?Meds ordered this encounter  ?Medications  ? ALPRAZolam (XANAX) 0.25 MG tablet  ?  Sig: TAKE 1 TABLET BY MOUTH AT BEDTIME AS NEEDED FOR ANXIETY  ?  Dispense:  30 tablet  ?  Refill:  0  ?  Order Specific Question:   Supervising Provider  ?  Answer:   JEGEDE, [name] [[id number]]  ? Zacarias Krauter Moore Sady Monaco  APRN, MSN, FNP-C ?[facility] ?[facility] ?[address]  ?[place], [place] [address] ?[phone] ? ?

## 2022-01-19 NOTE — Telephone Encounter (Signed)
This was refilled on yesterday 01/18/22.

## 2022-01-24 ENCOUNTER — Other Ambulatory Visit: Payer: Self-pay | Admitting: Internal Medicine

## 2022-01-24 ENCOUNTER — Other Ambulatory Visit: Payer: Self-pay | Admitting: Nurse Practitioner

## 2022-01-24 DIAGNOSIS — J302 Other seasonal allergic rhinitis: Secondary | ICD-10-CM

## 2022-02-05 ENCOUNTER — Other Ambulatory Visit: Payer: Self-pay | Admitting: Family Medicine

## 2022-02-05 DIAGNOSIS — R21 Rash and other nonspecific skin eruption: Secondary | ICD-10-CM

## 2022-02-06 ENCOUNTER — Telehealth: Payer: Self-pay | Admitting: Internal Medicine

## 2022-02-06 MED ORDER — AZITHROMYCIN 250 MG PO TABS: ORAL_TABLET | ORAL | 0 refills | Status: AC

## 2022-02-06 MED ORDER — PREDNISONE 10 MG PO TABS: ORAL_TABLET | ORAL | 0 refills | Status: DC

## 2022-02-06 NOTE — Telephone Encounter (Signed)
Spoke to patient.  He c/o prod cough with yellow to black sputum, wheezing, head/chest congestion and sob with exertion x10d Denied f/c/s or additional sx. He wears 2L QHS. Spo2 is maintaining around 92%.  He is using albuterol HFA 6-8x daily, albuterol solution TID, mucinex 2400mg  daily and symbicort BID.  Fully vaccinated against covid an flu.  No recent covid or flu test.   Dr. , please advise.

## 2022-02-06 NOTE — Telephone Encounter (Signed)
Prefer he come in for aecopd but if can't accomodate him this week  Zpak  Prednisone 10 mg take  4 each am x 2 days,   2 each am x 2 days,  1 each am x 2 days and stop   F/u with me 2-4 weeks

## 2022-02-06 NOTE — Telephone Encounter (Signed)
Lm x1 for patient.  

## 2022-02-06 NOTE — Telephone Encounter (Signed)
Patient is aware of recommendations and voiced his understanding.  There is no availability this week with MW or NP.  Appt scheduled 03/05/2022. Prednisone and zpak sent to preferred pharmacy.  Nothing further needed.

## 2022-02-07 DIAGNOSIS — J9611 Chronic respiratory failure with hypoxia: Secondary | ICD-10-CM | POA: Diagnosis not present

## 2022-02-07 DIAGNOSIS — J449 Chronic obstructive pulmonary disease, unspecified: Secondary | ICD-10-CM | POA: Diagnosis not present

## 2022-02-20 ENCOUNTER — Other Ambulatory Visit: Payer: Self-pay | Admitting: Family Medicine

## 2022-02-20 DIAGNOSIS — F411 Generalized anxiety disorder: Secondary | ICD-10-CM

## 2022-03-05 ENCOUNTER — Encounter: Payer: Self-pay | Admitting: Internal Medicine

## 2022-03-05 ENCOUNTER — Ambulatory Visit (INDEPENDENT_AMBULATORY_CARE_PROVIDER_SITE_OTHER): Payer: Medicaid Other

## 2022-03-05 ENCOUNTER — Ambulatory Visit (INDEPENDENT_AMBULATORY_CARE_PROVIDER_SITE_OTHER): Payer: Medicaid Other | Admitting: Internal Medicine

## 2022-03-05 ENCOUNTER — Other Ambulatory Visit: Payer: Self-pay

## 2022-03-05 DIAGNOSIS — J9612 Chronic respiratory failure with hypercapnia: Secondary | ICD-10-CM | POA: Diagnosis not present

## 2022-03-05 DIAGNOSIS — J449 Chronic obstructive pulmonary disease, unspecified: Secondary | ICD-10-CM

## 2022-03-05 DIAGNOSIS — J9611 Chronic respiratory failure with hypoxia: Secondary | ICD-10-CM

## 2022-03-05 DIAGNOSIS — R059 Cough, unspecified: Secondary | ICD-10-CM | POA: Diagnosis not present

## 2022-03-05 MED ORDER — PREDNISONE 10 MG PO TABS: ORAL_TABLET | ORAL | 0 refills | Status: DC

## 2022-03-05 MED ORDER — FAMOTIDINE 20 MG PO TABS: ORAL_TABLET | ORAL | 11 refills | Status: DC

## 2022-03-05 MED ORDER — PANTOPRAZOLE SODIUM 40 MG PO TBEC
40.0000 mg | DELAYED_RELEASE_TABLET | Freq: Every day | ORAL | 2 refills | Status: DC
Start: 2022-03-05 — End: 2023-06-12

## 2022-03-05 NOTE — Progress Notes (Signed)
?Subjective:  ?  ? Patient ID: Kenneth Casey, male   DOB: 28-Oct-1956    MRN: [id number] ? ? ? 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 ? ?  ? ?History of Present Illness  ?12/21/2015 1st [facility] office visit/ Kenneth Casey   ?Chief Complaint  ?Patient presents with  ? HFU  ?  Pt states that his breathing has improved some, but not baseline for him. He c/o chest congestion and has had some cough with clear sputum. He has been wheezing some. He is using albuterol inhaler 4-5 x per day and neb 2-4 x per day.   ?has very poor hfa technique on symbicort 160 / cough worse in am ?Doe = MMRC2 = can't walk a nl pace on a flat grade s sob ?rec ?For cough mucinex dm 1200 mg every 12 hours as needed  ?Plan A = automatic = Symbicort 160 Take 2 puffs first thing in am and then another 2 puffs about 12 hours later.  ?Plan B= Backup ?Only use your albuterol as a rescue medication to  ?Plan C = crisis ?Only use nebulizer if you try the ventolin first and it doesn't work  ?Try prilosec otc 20mg   Take 30-60 min before first meal of the day and Pepcid ac (famotidine) 20 mg one @  bedtime until cough is completely gone for at least a week without the need for cough suppression ?GERD diet  ?  ?   ? ?04/14/2018  f/u ov/Kenneth Casey re:   Copd GOLD III on symb 160 2bid and 02 2lpm hs and 5lpm walking  ?Chief Complaint  ?Patient presents with  ? Follow-up  ?  Increased SOB since the last visit- relates to the humid weather. He states sometimes hears "gurgling in chest". He is using his albuterol inhaler 2-3 x per day and neb once per wk on average.   ?Dyspnea:  On 02 5lpm pulsed walks up 20 min  L legs gives out first  ?Cough: not much ?Sleep: on 2lpm / 15 degrees  ?SABA use:  Varies depending on activity ?rec ?No change in medications ?Work on inhaler  technique:   ?Congratulations on your wt loss > keep it up  ?  ? ? ? ?07/14/2018  f/u ov/Kenneth Casey re: copd gold II (improved from priors) on symb 160 2bid  ?Chief Complaint  ?Patient presents with  ? Follow-up  ?  PFT today, SOB, productive cough, headache, dental issues, O2 with activity and sleep  ? Dyspnea:  20 min on 5lpm walking at park some hills ?Cough: some am / dark, thick  ?Sleeping: ok on 4lpm rarely wakes up with HA  ?SABA use: 4-5 x per week and neb if out in heat  ?02: 4lpm hs and 5lpm ex   ?rec ?Plan A = Automatic = Symbicort 160 Take 2 puffs first thing in am and then another 2 puffs about 12 hours later.  ?Work on 09/13/2018:  ?Plan B = Backup ?Only use your albuterol as a rescue medication  ?Plan C = Crisis ?- only use your albuterol nebulizer if you first try Plan B   ?Daytime goal is to keep 02 sats above 90% at all times > adjust your 02 to get to that level ?Congratulations on weight loss  ?  Please schedule a follow up visit in 3 months but call sooner if needed  ? ? ?07/14/2020  f/u ov/Kenneth Casey re: copd II / obesity /maint symb 160 2bid / limited by L leg pain/ weak   ?Chief Complaint  ?Patient presents with  ? Follow-up  ?  Breathing is overall doing well. He states having some cough and congestion when it's humid out- brings up clear sputum in the am's. He is using albuterol inhaler 2 x per day and uses neb maybe 3 x per month.   ?Dyspnea:  Limited by knee/hip numbness painful paresthesias  ?Cough: about the same/ assoc with pnds  ?Sleeping: recliner x 45 degees ?SABA use: twice daily saba p ex, never pre or rechallenges ?02:  2lpm hs and prn  ?Rec ?Zpak  ?No change in recommendations  ? ? ?03/05/2022  f/u ov/Kenneth Casey re: GOLD 2 copd   maint on symb 160  for falre since 01/27/22  ?Chief Complaint  ?Patient presents with  ? Follow-up  ?  Pt states he has had complaints of congestion in chest. States that he does feel some better after recent prednisone and zpak. States he does cough  occasionally and is now getting up clear phlegm.  ?Dyspnea:  still limited by knees and hips  ?Cough: white mucus worse p supper / on mucienx dm better but still coughing  ?Sleeping: 45 degree electric bed  ?SABA use: twice daily  ?02: 2lpm hs and very little in house  ?Covid status:   vax x 4  ? ? ?No obvious day to day or daytime variability or assoc excess/ purulent sputum or mucus plugs or hemoptysis or cp or chest tightness, subjective wheeze or overt sinus or hb symptoms.  ? ?Sleeping  without nocturnal  or early am exacerbation  of respiratory  c/o's or need for noct saba. Also denies any obvious fluctuation of symptoms with weather or environmental changes or other aggravating or alleviating factors except as outlined above  ? ?No unusual exposure hx or h/o childhood pna/ asthma or knowledge of premature birth. ? ?Current Allergies, Complete Past Medical History, Past Surgical History, Family History, and Social History were reviewed in [employer] record. ? ?ROS  The following are not active complaints unless bolded ?Hoarseness, sore throat, dysphagia, dental problems, itching, sneezing,  nasal congestion or discharge of excess mucus or purulent secretions, ear ache,   fever, chills, sweats, unintended wt loss or wt gain, classically pleuritic or exertional cp,  orthopnea pnd or arm/hand swelling  or leg swelling, presyncope, palpitations, abdominal pain, anorexia, nausea, vomiting, diarrhea  or change in bowel habits or change in bladder habits, change in stools or change in urine, dysuria, hematuria,  rash, arthralgias, visual complaints, headache, numbness, weakness or ataxia or problems with walking or coordination,  change in mood or  memory. ?      ? ?Current Meds  ?Medication Sig  ? acetaminophen (TYLENOL) 500 MG tablet Take 1,000 mg by mouth every 6 (six) hours as needed for moderate pain.  ? albuterol (PROVENTIL) (2.5 MG/3ML) 0.083% nebulizer solution USE 1 VIAL IN NEBULIZER  EVERY 4 HOURS AS NEEDED FOR WHEEZING FOR SHORTNESS OF BREATH  ? albuterol (VENTOLIN HFA) 108 (90 Base) MCG/ACT inhaler Inhale 1-2 puffs into the lungs every 6 (six) hours as needed for wheezing or shortness of breath.  ? ALPRAZolam (XANAX) 0.25 MG tablet TAKE 1 TABLET BY MOUTH AT BEDTIME AS NEEDED FOR ANXIETY  ? carvedilol (COREG) 6.25 MG tablet Take  1 tablet (6.25 mg total) by mouth 2 (two) times daily with a meal.  ? dextromethorphan-guaiFENesin (MUCINEX DM) 30-600 MG 12hr tablet Take 1 tablet by mouth 2 (two) times daily.  ? fluticasone (FLONASE) 50 MCG/ACT nasal spray USE 2 SPRAY(S) IN EACH NOSTRIL ONCE DAILY AS NEEDED FOR ALLERGIES  ? gabapentin (NEURONTIN) 400 MG capsule Take 1 capsule (400 mg total) by mouth 2 (two) times daily.  ? naproxen (NAPROSYN) 500 MG tablet TAKE 1 TABLET BY MOUTH TWICE DAILY WITH A MEAL  ? OXYGEN 2 with sleep and exertion  ? SYMBICORT 160-4.5 MCG/ACT inhaler INHALE 2 PUFFS BY MOUTH FIRST THING IN THE MORNING THEN ANOTHER 2 PUFFS ABOUT 12 HOURS LATER  ? tamsulosin (FLOMAX) 0.4 MG CAPS capsule Take 0.4 mg by mouth daily.  ? triamcinolone ointment (KENALOG) 0.5 % APPLY  OINTMENT TOPICALLY TWICE DAILY  ?    ? ?  ?  ?  ?Objective:  ? Physical Exam ? ?wts ?  ?03/05/2022    299  ?07/14/2020      253 ? 07/15/2019     232  ?04/16/2016          309 >  05/28/2016  305 > 09/03/2016   318 >  02/13/2017  323 >  08/13/2017  326 > 01/13/2018  328 > 04/14/2018  312 > 07/14/2018    290 > 10/20/2018  267 > ?01/17/2016          311   ?12/20/15 308 lb (139.708 kg)  ?12/13/15 315 lb (142.883 kg)  ?12/08/15 312 lb (141.522 kg)  ?   ?Vital signs reviewed  03/05/2022  - Note at rest 02 sats  99% on RA  ? ?General appearance:    obese wm nad/ rattling cough  ? ?  HEENT : pt wearing mask not removed for exam due to covid - 19 concerns.  ? ?NECK :  without JVD/Nodes/TM/ nl carotid upstrokes bilaterally ? ? ?LUNGS: no acc muscle use,  Min barrel  contour chest wall with bilateral  insp/exp rhonchi  and  without cough on insp or exp  maneuvers and min  Hyperresonant  to  percussion bilaterally   ? ? ?CV:  RRR  no s3 or murmur or increase in P2, and no edema  ? ?ABD:  soft and nontender with pos end  insp Hoover's  in the supine position. No bru

## 2022-03-05 NOTE — Patient Instructions (Addendum)
For cough continue mucinex dm 1200 mg every 12 hours as needed ? ?Prednisone 10 mg take  4 each am x 2 days,   2 each am x 2 days,  1 each am x 2 days and stop  ? ?Pantoprazole (protonix) 40 mg   Take  30-60 min before first meal of the day and Pepcid (famotidine)  20 mg after supper until the cough is gone for a week ? ?GERD (REFLUX)  is an extremely common cause of respiratory symptoms just like yours , many times with no obvious heartburn at all.  ? ? It can be treated with medication, but also with lifestyle changes including elevation of the head of your bed (ideally with 6 -8inch blocks under the headboard of your bed),  Smoking cessation, avoidance of late meals, excessive alcohol, and avoid fatty foods, chocolate, peppermint, colas, red wine, and acidic juices such as orange juice.  ?NO MINT OR MENTHOL PRODUCTS SO NO COUGH DROPS  ?USE SUGARLESS CANDY INSTEAD (Jolley ranchers or Stover's or Life Savers) or even ice chips will also do - the key is to swallow to prevent all throat clearing. ?NO OIL BASED VITAMINS - use powdered substitutes.  Avoid fish oil when coughing.  ? ?Work on inhaler technique:  relax and gently blow all the way out then take a nice smooth full deep breath back in, triggering the inhaler at same time you start breathing in.  Hold for up to 5 seconds if you can. Blow out thru nose. Rinse and gargle with water when done.  If mouth or throat bother you at all,  try brushing teeth/gums/tongue with arm and hammer toothpaste/ make a slurry and gargle and spit out.  ? ?   ? ?Please remember to go to the  x-ray department  for your tests - we will call you with the results when they are available   ? ?Please schedule a follow up visit in 3 months but call sooner if needed  ?

## 2022-03-06 ENCOUNTER — Encounter: Payer: Self-pay | Admitting: Internal Medicine

## 2022-03-06 NOTE — Assessment & Plan Note (Signed)
On 02 3lpm since d/c 11/18/2015 ?- HCO3  34  12/14/15  ?- 01/17/2016 sats mid 90's RA at rest so rec 2lpm sleeping / exerting but none needed at rest  ?- 04/16/2016  Walked 4lpm pulsed x 3 laps @ 185 ft each stopped due to end of study, mild sob, no desat  ?- 08/13/2017  Walked RA x 3 laps @ 185 ft each stopped due to  End of study, nl to mod fast pace, no desat , min sob   ?- 01/13/2018  Walked RA x 3 laps @ 185 ft each stopped due to  End of study, nl pace, no sob or desat   ?- HCO3  01/13/2018  =  30 c/w only mild hypercarbia   ?- ONO RA  04/01/18 desat x 5 h and 20 min so needs 2lpm   ?- 04/14/2018  Walked RA x 3 laps @ 185 ft each stopped due to  End of study, sats 88% corrected on 2lpm  ?- 04/22/18  ono on 3lpm = 1h 44 min  So rec 4lpm and repeat on 4lpm > done 05/01/18  only 12 min sat @ < 89% so no change rx  ?- 10/20/2018 pt titrated down to 2lpm with wt loss and "sleeps great"  ? ? ?No change rx = 2lpm hs and titrate daytime to keep > 90% ? ?    ?  ? ?Each maintenance medication was reviewed in detail including emphasizing most importantly the difference between maintenance and prns and under what circumstances the prns are to be triggered using an action plan format where appropriate. ? ?Total time for H and P, chart review, counseling, reviewing hfa/02/neb device(s) and generating customized AVS unique to this office visit / same day charting =20 min  ?     ?

## 2022-03-06 NOTE — Assessment & Plan Note (Addendum)
Quit smoking 2012  ?12/20/2015  extensive coaching HFA effectiveness =    75% > continue symbicort 160 2bid ?- Spirometry 01/17/2016  FEV1 1.62 (44%)  Ratio 61 ?- 05/28/2016   try bevespi 2bid > improved 09/03/2016 but decided liked symbicort better = 80 bid> increased to 160 2bid  08/13/17  ?- 04/14/2018  After extensive coaching inhaler device  effectiveness =   90% p 4 tries  ?- 07/14/2018  After extensive coaching inhaler device  effectiveness =    90%  ?- PFT's  07/14/2018  FEV1 1.80 (57 % ) ratio 60  p 9 % improvement from saba p nothing prior to study with DLCO  94 % corrects to 121 % for alv volume   And erv 16% @ 290 lbs ? ?- The proper method of use, as well as anticipated side effects, of a metered-dose inhaler were discussed and demonstrated to the patient using teach back method. Improved effectiveness after extensive coaching during this visit to a level of approximately 80 % from a baseline of 50 % > continue symb 160 plus Prednisone 10 mg take  4 each am x 2 days,   2 each am x 2 days,  1 each am x 2 days and stop  ? ?Cough flared with uri > reminded to use ppi/ h2 hs until cough resolves plus max mucinex dm  ? ?  ?

## 2022-03-10 DIAGNOSIS — J9611 Chronic respiratory failure with hypoxia: Secondary | ICD-10-CM | POA: Diagnosis not present

## 2022-03-10 DIAGNOSIS — J449 Chronic obstructive pulmonary disease, unspecified: Secondary | ICD-10-CM | POA: Diagnosis not present

## 2022-03-27 ENCOUNTER — Other Ambulatory Visit: Payer: Self-pay | Admitting: Family Medicine

## 2022-03-27 DIAGNOSIS — F411 Generalized anxiety disorder: Secondary | ICD-10-CM

## 2022-03-30 ENCOUNTER — Ambulatory Visit (INDEPENDENT_AMBULATORY_CARE_PROVIDER_SITE_OTHER): Payer: Medicaid Other | Admitting: Nurse Practitioner

## 2022-03-30 ENCOUNTER — Ambulatory Visit (HOSPITAL_COMMUNITY)
Admission: RE | Admit: 2022-03-30 | Discharge: 2022-03-30 | Disposition: A | Discharge status: Home / Self Care | Payer: Medicaid Other | Source: Ambulatory Visit | Attending: Nurse Practitioner | Admitting: Nurse Practitioner

## 2022-03-30 ENCOUNTER — Encounter: Payer: Self-pay | Admitting: Nurse Practitioner

## 2022-03-30 VITALS — BP 161/83 | HR 65 | Temp 99.3°F | Ht 69.0 in | Wt 300.8 lb

## 2022-03-30 DIAGNOSIS — M722 Plantar fascial fibromatosis: Secondary | ICD-10-CM | POA: Insufficient documentation

## 2022-03-30 DIAGNOSIS — M7731 Calcaneal spur, right foot: Secondary | ICD-10-CM | POA: Diagnosis not present

## 2022-03-30 MED ORDER — DICLOFENAC SODIUM 75 MG PO TBEC: 75.0000 mg | DELAYED_RELEASE_TABLET | Freq: Two times a day (BID) | ORAL | 0 refills | Status: DC

## 2022-03-30 NOTE — Progress Notes (Signed)
@Patient  ID: , male    DOB: 04/23/1956, 66 y.o.   MRN: [id number]  Chief Complaint  Patient presents with   Foot Pain    Patient is here today for right foot pains.    Referring provider: [id number], FNP   HPI  Patient presents today with right foot pain.  He states that his foot hurts from the arch to the back of the heel.  He states that this started 5 weeks ago.  We discussed proper shoe inserts, using ice packs as needed and resting his foot.  We will order Voltaren tablets. Denies f/c/s, n/v/d, hemoptysis, PND, leg swelling Denies chest pain or edema    Note: Blood pressure elevated - patient states that it is from being in the doctor's office  He checks blood pressure at home and states that it has been within normal range     Allergies  Allergen Reactions   Adhesive [Tape] Rash    Immunization History  Administered Date(s) Administered   Influenza Split 09/10/2015   Influenza,inj,Quad PF,6+ Mos 09/03/2016, 08/13/2017, 08/28/2018, 09/10/2019   Influenza-Unspecified 08/10/2018   Moderna Sars-Covid-2 Vaccination 07/11/2020, 08/05/2020   Pneumococcal Polysaccharide-23 07/15/2019    Past Medical History:  Diagnosis Date   COPD (chronic obstructive pulmonary disease) ([facility])    Obese    Pneumonia     Tobacco History: Social History   Tobacco Use  Smoking Status Former   Packs/day: 2.00   Years: 30.00   Total pack years: 60.00   Types: Cigarettes   Quit date: 01/08/2011   Years since quitting: 11.6  Smokeless Tobacco Never   Counseling given: Not Answered   Outpatient Encounter Medications as of 03/30/2022  Medication Sig   acetaminophen (TYLENOL) 500 MG tablet Take 1,000 mg by mouth every 6 (six) hours as needed for moderate pain.   albuterol (PROVENTIL) (2.5 MG/3ML) 0.083% nebulizer solution USE 1 VIAL IN NEBULIZER EVERY 4 HOURS AS NEEDED FOR WHEEZING FOR SHORTNESS OF BREATH   albuterol (VENTOLIN HFA) 108 (90 Base) MCG/ACT inhaler  Inhale 1-2 puffs into the lungs every 6 (six) hours as needed for wheezing or shortness of breath.   diclofenac (VOLTAREN) 75 MG EC tablet Take 1 tablet (75 mg total) by mouth 2 (two) times daily.   famotidine (PEPCID) 20 MG tablet One after supper   fluticasone (FLONASE) 50 MCG/ACT nasal spray USE 2 SPRAY(S) IN EACH NOSTRIL ONCE DAILY AS NEEDED FOR ALLERGIES   OXYGEN 2 with sleep and exertion   pantoprazole (PROTONIX) 40 MG tablet Take 1 tablet (40 mg total) by mouth daily. Take 30-60 min before first meal of the day   SYMBICORT 160-4.5 MCG/ACT inhaler INHALE 2 PUFFS BY MOUTH FIRST THING IN THE MORNING THEN ANOTHER 2 PUFFS ABOUT 12 HOURS LATER   tamsulosin (FLOMAX) 0.4 MG CAPS capsule Take 0.4 mg by mouth daily.   triamcinolone ointment (KENALOG) 0.5 % APPLY  OINTMENT TOPICALLY TWICE DAILY   [DISCONTINUED] ALPRAZolam (XANAX) 0.25 MG tablet TAKE 1 TABLET BY MOUTH AT BEDTIME AS NEEDED FOR ANXIETY   [DISCONTINUED] carvedilol (COREG) 6.25 MG tablet Take 1 tablet (6.25 mg total) by mouth 2 (two) times daily with a meal.   [DISCONTINUED] gabapentin (NEURONTIN) 400 MG capsule Take 1 capsule (400 mg total) by mouth 2 (two) times daily.   [DISCONTINUED] naproxen (NAPROSYN) 500 MG tablet TAKE 1 TABLET BY MOUTH TWICE DAILY WITH A MEAL   dextromethorphan-guaiFENesin (MUCINEX DM) 30-600 MG 12hr tablet Take 1 tablet by mouth 2 (two) times  daily.   predniSONE (DELTASONE) 10 MG tablet Take  4 each am x 2 days,   2 each am x 2 days,  1 each am x 2 days and stop   No facility-administered encounter medications on file as of 03/30/2022.     Review of Systems  Review of Systems  Constitutional: Negative.   HENT: Negative.    Cardiovascular: Negative.   Gastrointestinal: Negative.   Musculoskeletal:        Foot pain  Allergic/Immunologic: Negative.   Neurological: Negative.   Psychiatric/Behavioral: Negative.         Physical Exam  BP (!) 161/83   Pulse 65   Temp 99.3 F (37.4 C)   Ht 5\' 9"   (1.753 m)   Wt (!) 300 lb 12.8 oz (136.4 kg)   SpO2 97%   BMI 44.42 kg/m   Wt Readings from Last 5 Encounters:  08/14/22 298 lb 3.2 oz (135.3 kg)  07/10/22 (!) 301 lb 3.2 oz (136.6 kg)  03/30/22 (!) 300 lb 12.8 oz (136.4 kg)  03/05/22 299 lb 12.8 oz (136 kg)  12/05/21 293 lb 3.2 oz (133 kg)     Physical Exam Vitals and nursing note reviewed.  Constitutional:      General: He is not in acute distress.    Appearance: He is well-developed.  Cardiovascular:     Rate and Rhythm: Normal rate and regular rhythm.  Pulmonary:     Effort: Pulmonary effort is normal.     Breath sounds: Normal breath sounds.  Skin:    General: Skin is warm and dry.  Neurological:     Mental Status: He is alert and oriented to person, place, and time.      Lab Results:  CBC    Component Value Date/Time   WBC 6.7 06/21/2021 1050   WBC 12.4 (H) 10/29/2017 1537   RBC 5.52 06/21/2021 1050   RBC 5.37 10/29/2017 1537   HGB 15.6 06/21/2021 1050   HCT 48.3 06/21/2021 1050   PLT 262 06/21/2021 1050   MCV 88 06/21/2021 1050   MCH 28.3 06/21/2021 1050   MCH 27.4 10/29/2017 1537   MCHC 32.3 06/21/2021 1050   MCHC 32.6 10/29/2017 1537   RDW 13.1 06/21/2021 1050   LYMPHSABS 1.0 06/21/2021 1050   MONOABS 690 01/01/2017 1355   EOSABS 0.3 06/21/2021 1050   BASOSABS 0.1 06/21/2021 1050    BMET    Component Value Date/Time   NA 141 07/10/2022 1208   K 4.1 07/10/2022 1208   CL 100 07/10/2022 1208   CO2 24 07/10/2022 1208   GLUCOSE 92 07/10/2022 1208   GLUCOSE 99 10/29/2017 1537   BUN 22 07/10/2022 1208   CREATININE 0.97 07/10/2022 1208   CREATININE 1.08 10/29/2017 1537   CALCIUM 9.2 07/10/2022 1208   GFRNONAA 85 12/21/2020 1123   GFRNONAA 75 10/29/2017 1537   GFRAA 99 12/21/2020 1123   GFRAA 87 10/29/2017 1537    BNP    Component Value Date/Time   BNP 9.9 08/13/2017 1243    ProBNP No results found for: "PROBNP"  Imaging: No results found.   Assessment & Plan:   Plantar  fasciitis - diclofenac (VOLTAREN) 75 MG EC tablet; Take 1 tablet (75 mg total) by mouth 2 (two) times daily.  Dispense: 30 tablet; Refill: 0 - DG Foot Complete Right   - may use ice packs   - may try shoe inserts  Follow up:  Follow up as needed     [place]  [place] Baba, NP 09/05/2022

## 2022-03-30 NOTE — Patient Instructions (Signed)
1. Plantar fasciitis ? ?- diclofenac (VOLTAREN) 75 MG EC tablet; Take 1 tablet (75 mg total) by mouth 2 (two) times daily.  Dispense: 30 tablet; Refill: 0 ?- DG Foot Complete Right ? ? - may use ice packs ? ? - may try shoe inserts ? ?Follow up: ? ?Follow up as needed ? ?Plantar Fasciitis ? ?Plantar fasciitis is a painful foot condition that affects the heel. It occurs when the band of tissue that connects the toes to the heel bone (plantar fascia) becomes irritated. This can happen as the result of exercising too much or doing other repetitive activities (overuse injury). ?Plantar fasciitis can cause mild irritation to severe pain that makes it difficult to walk or move. The pain is usually worse in the morning after sleeping, or after sitting or lying down for a period of time. Pain may also be worse after long periods of walking or standing. ?What are the causes? ?This condition may be caused by: ?Standing for long periods of time. ?Wearing shoes that do not have good arch support. ?Doing activities that put stress on joints (high-impact activities). This includes ballet and exercise that makes your heart beat faster (aerobic exercise), such as running. ?Being overweight. ?An abnormal way of walking (gait). ?Tight muscles in the back of your lower leg (calf). ?High arches in your feet or flat feet. ?Starting a new athletic activity. ?What are the signs or symptoms? ?The main symptom of this condition is heel pain. Pain may get worse after the following: ?Taking the first steps after a time of rest, especially in the morning after awakening, or after you have been sitting or lying down for a while. ?Long periods of standing still. ?Pain may decrease after 30-45 minutes of activity, such as gentle walking. ?How is this diagnosed? ?This condition may be diagnosed based on your medical history, a physical exam, and your symptoms. Your health care provider will check for: ?A tender area on the bottom of your foot. ?A  high arch in your foot or flat feet. ?Pain when you move your foot. ?Difficulty moving your foot. ?You may have imaging tests to confirm the diagnosis, such as: ?X-rays. ?Ultrasound. ?MRI. ?How is this treated? ?Treatment for plantar fasciitis depends on how severe your condition is. Treatment may include: ?Rest, ice, pressure (compression), and raising (elevating) the affected foot. This is called RICE therapy. Your health care provider may recommend RICE therapy along with over-the-counter pain medicines to manage your pain. ?Exercises to stretch your calves and your plantar fascia. ?A splint that holds your foot in a stretched, upward position while you sleep (night splint). ?Physical therapy to relieve symptoms and prevent problems in the future. ?Injections of steroid medicine (cortisone) to relieve pain and inflammation. ?Stimulating your plantar fascia with electrical impulses (extracorporeal shock wave therapy). This is usually the last treatment option before surgery. ?Surgery, if other treatments have not worked after 12 months. ?Follow these instructions at home: ?Managing pain, stiffness, and swelling ? ?If directed, put ice on the painful area. To do this: ?Put ice in a plastic bag, or use a frozen bottle of water. ?Place a towel between your skin and the bag or bottle. ?Roll the bottom of your foot over the bag or bottle. ?Do this for 20 minutes, 2-3 times a day. ?Wear athletic shoes that have air-sole or gel-sole cushions, or try soft shoe inserts that are designed for plantar fasciitis. ?Elevate your foot above the level of your heart while you are sitting or  lying down. ?Activity ?Avoid activities that cause pain. Ask your health care provider what activities are safe for you. ?Do physical therapy exercises and stretches as told by your health care provider. ?Try activities and forms of exercise that are easier on your joints (low impact). Examples include swimming, water aerobics, and  biking. ?General instructions ?Take over-the-counter and prescription medicines only as told by your health care provider. ?Wear a night splint while sleeping, if told by your health care provider. Loosen the splint if your toes tingle, become numb, or turn cold and blue. ?Maintain a healthy weight, or work with your health care provider to lose weight as needed. ?Keep all follow-up visits. This is important. ?Contact a health care provider if you have: ?Symptoms that do not go away with home treatment. ?Pain that gets worse. ?Pain that affects your ability to move or do daily activities. ?Summary ?Plantar fasciitis is a painful foot condition that affects the heel. It occurs when the band of tissue that connects the toes to the heel bone (plantar fascia) becomes irritated. ?Heel pain is the main symptom of this condition. It may get worse after exercising too much or standing still for a long time. ?Treatment varies, but it usually starts with rest, ice, pressure (compression), and raising (elevating) the affected foot. This is called RICE therapy. Over-the-counter medicines can also be used to manage pain. ?This information is not intended to replace advice given to you by your health care provider. Make sure you discuss any questions you have with your health care provider. ?Document Revised: 03/14/2020 Document Reviewed: 03/14/2020 ?Elsevier Patient Education ? [place]. ? ? ?

## 2022-04-02 ENCOUNTER — Other Ambulatory Visit: Payer: Self-pay | Admitting: Nurse Practitioner

## 2022-04-02 DIAGNOSIS — M722 Plantar fascial fibromatosis: Secondary | ICD-10-CM

## 2022-04-02 DIAGNOSIS — M7750 Other enthesopathy of unspecified foot: Secondary | ICD-10-CM

## 2022-04-09 DIAGNOSIS — J9611 Chronic respiratory failure with hypoxia: Secondary | ICD-10-CM | POA: Diagnosis not present

## 2022-04-09 DIAGNOSIS — J449 Chronic obstructive pulmonary disease, unspecified: Secondary | ICD-10-CM | POA: Diagnosis not present

## 2022-04-16 ENCOUNTER — Ambulatory Visit (INDEPENDENT_AMBULATORY_CARE_PROVIDER_SITE_OTHER): Payer: Medicaid Other | Admitting: Podiatry

## 2022-04-16 ENCOUNTER — Encounter: Payer: Self-pay | Admitting: Podiatry

## 2022-04-16 DIAGNOSIS — M722 Plantar fascial fibromatosis: Secondary | ICD-10-CM

## 2022-04-16 MED ORDER — METHYLPREDNISOLONE 4 MG PO TBPK: ORAL_TABLET | ORAL | 0 refills | Status: DC

## 2022-04-16 NOTE — Progress Notes (Signed)
? ?  Subjective: ?66 y.o. male presenting today as a new patient for evaluation of right heel pain this been going on for about 5 weeks now.  Patient went to his PCP where x-rays were taken and he was prescribed a muscle relaxer.  He says the muscle relaxer did not help.  He recently got some arch supports which have begun helping alleviate pressure from his heel.  He presents for further treatment and evaluation ? ? ?Past Medical History:  ?Diagnosis Date  ? COPD (chronic obstructive pulmonary disease) ([place])   ? Obese   ? Pneumonia   ? ?Past Surgical History:  ?Procedure Laterality Date  ? CHOLECYSTECTOMY    ? ?Allergies  ?Allergen Reactions  ? Adhesive [Tape] Rash  ? ? ? ?Objective: ?Physical Exam ?General: The patient is alert and oriented x3 in no acute distress. ? ?Dermatology: Skin is warm, dry and supple bilateral lower extremities. Negative for open lesions or macerations bilateral.  ? ?Vascular: Dorsalis Pedis and Posterior Tibial pulses palpable bilateral.  Capillary fill time is immediate to all digits. ? ?Neurological: Epicritic and protective threshold intact bilateral.  ? ?Musculoskeletal: Tenderness to palpation to the plantar aspect of the right heel along the plantar fascia. All other joints range of motion within normal limits bilateral. Strength 5/5 in all groups bilateral.  ? ?Radiographic exam RT foot taken by PCP: ?Normal osseous mineralization. Joint spaces preserved. No fracture/dislocation/boney destruction. No other soft tissue abnormalities or radiopaque foreign bodies.  Plantar heel spur noted on lateral view ? ?Assessment: ?1. Plantar fasciitis right ? ?Plan of Care:  ?1. Patient evaluated. Xrays reviewed.   ?2.  Patient declined injection today ?3. Rx for Medrol Dose Pack placed ?4.  Patient has naproxen at home.  Continue after completion of the Dosepak. ?5. Plantar fascial band(s) dispensed ?6.  Continue OTC arch supports that were already purchased ?7.  Patient may discontinue the  muscle relaxer prescribed ?8.  Return to clinic in 4 weeks.   ? ?*Wife's name is Butch Penny ? ? ?Edrick Kins, DPM ?[facility] ? ?Dr. Edrick Kins, DPM  ?  ?[address]                                        ?[place], [place] [address]                ?Office [phone]  ?Fax [phone] ? ? ? ? ? ?

## 2022-04-24 ENCOUNTER — Other Ambulatory Visit: Payer: Self-pay | Admitting: Family Medicine

## 2022-04-24 DIAGNOSIS — F411 Generalized anxiety disorder: Secondary | ICD-10-CM

## 2022-04-25 ENCOUNTER — Other Ambulatory Visit: Payer: Self-pay | Admitting: Family Medicine

## 2022-04-25 DIAGNOSIS — F411 Generalized anxiety disorder: Secondary | ICD-10-CM

## 2022-04-25 MED ORDER — ALPRAZOLAM 0.25 MG PO TABS: ORAL_TABLET | ORAL | 0 refills | Status: DC

## 2022-04-25 NOTE — Progress Notes (Signed)
Reviewed PDMP substance reporting system prior to prescribing opiate medications. No inconsistencies noted.  ?Meds ordered this encounter  ?Medications  ? ALPRAZolam (XANAX) 0.25 MG tablet  ?  Sig: TAKE 1 TABLET BY MOUTH AT BEDTIME AS NEEDED FOR ANXIETY  ?  Dispense:  30 tablet  ?  Refill:  0  ?  Order Specific Question:   Supervising Provider  ?  AnswerQuentin Angst [[id number]]  ? Nolon Nations  APRN, MSN, FNP-C ?[facility] ?[facility] ?[address]  ?[place], [place] [address] ?[phone] ? ?

## 2022-05-10 DIAGNOSIS — J9611 Chronic respiratory failure with hypoxia: Secondary | ICD-10-CM | POA: Diagnosis not present

## 2022-05-10 DIAGNOSIS — J449 Chronic obstructive pulmonary disease, unspecified: Secondary | ICD-10-CM | POA: Diagnosis not present

## 2022-05-21 ENCOUNTER — Ambulatory Visit (INDEPENDENT_AMBULATORY_CARE_PROVIDER_SITE_OTHER): Payer: Medicaid Other | Admitting: Podiatry

## 2022-05-21 DIAGNOSIS — M722 Plantar fascial fibromatosis: Secondary | ICD-10-CM

## 2022-05-21 NOTE — Progress Notes (Signed)
   Subjective: 66 y.o. male presenting today for follow-up evaluation of plantar fasciitis to the right foot.  Patient states that overall he is doing much better.  He no longer has the severe pain that he was experiencing prior.  He says the prednisone pack helped significantly.  He also continues to wear the arch supports that he purchased.   Past Medical History:  Diagnosis Date   COPD (chronic obstructive pulmonary disease) ([place])    Obese    Pneumonia    Past Surgical History:  Procedure Laterality Date   CHOLECYSTECTOMY     Allergies  Allergen Reactions   Adhesive [Tape] Rash     Objective: Physical Exam General: The patient is alert and oriented x3 in no acute distress.  Dermatology: Skin is warm, dry and supple bilateral lower extremities. Negative for open lesions or macerations bilateral.   Vascular: Dorsalis Pedis and Posterior Tibial pulses palpable bilateral.  Capillary fill time is immediate to all digits.  Neurological: Epicritic and protective threshold intact bilateral.   Musculoskeletal: Minimal tenderness to palpation to the plantar aspect of the right heel along the plantar fascia. All other joints range of motion within normal limits bilateral. Strength 5/5 in all groups bilateral.   Radiographic exam RT foot taken by PCP 03/30/2022: Normal osseous mineralization. Joint spaces preserved. No fracture/dislocation/boney destruction. No other soft tissue abnormalities or radiopaque foreign bodies.  Plantar heel spur noted on lateral view  Assessment: 1. Plantar fasciitis right  Plan of Care:  1. Patient evaluated.  Overall the patient is doing much better 2.  Patient declined injection today 3.  Continue naproxen at home. 4.  Continue plantar fascial brace 5.  Continue OTC arch supports that were already purchased 6.  Return to clinic as needed  *Wife's name is Cherre Huger, DPM [facility]  Dr. Edrick Kins, DPM    [address], [place] [address]                Office [phone]  Fax [phone]

## 2022-05-25 ENCOUNTER — Other Ambulatory Visit: Payer: Self-pay | Admitting: Family Medicine

## 2022-05-25 DIAGNOSIS — F411 Generalized anxiety disorder: Secondary | ICD-10-CM

## 2022-06-05 ENCOUNTER — Ambulatory Visit: Payer: Medicaid Other | Admitting: Family Medicine

## 2022-06-09 DIAGNOSIS — J449 Chronic obstructive pulmonary disease, unspecified: Secondary | ICD-10-CM | POA: Diagnosis not present

## 2022-06-09 DIAGNOSIS — J9611 Chronic respiratory failure with hypoxia: Secondary | ICD-10-CM | POA: Diagnosis not present

## 2022-06-25 ENCOUNTER — Other Ambulatory Visit: Payer: Self-pay | Admitting: Nurse Practitioner

## 2022-06-25 ENCOUNTER — Other Ambulatory Visit: Payer: Self-pay | Admitting: Family Medicine

## 2022-06-25 DIAGNOSIS — F411 Generalized anxiety disorder: Secondary | ICD-10-CM

## 2022-06-25 DIAGNOSIS — N451 Epididymitis: Secondary | ICD-10-CM

## 2022-07-10 ENCOUNTER — Encounter: Payer: Self-pay | Admitting: Family Medicine

## 2022-07-10 ENCOUNTER — Ambulatory Visit (INDEPENDENT_AMBULATORY_CARE_PROVIDER_SITE_OTHER): Payer: Medicaid Other | Admitting: Family Medicine

## 2022-07-10 VITALS — BP 146/81 | HR 74 | Temp 97.5°F | Ht 69.0 in | Wt 301.2 lb

## 2022-07-10 DIAGNOSIS — G629 Polyneuropathy, unspecified: Secondary | ICD-10-CM

## 2022-07-10 DIAGNOSIS — N451 Epididymitis: Secondary | ICD-10-CM | POA: Diagnosis not present

## 2022-07-10 DIAGNOSIS — R7303 Prediabetes: Secondary | ICD-10-CM | POA: Diagnosis not present

## 2022-07-10 DIAGNOSIS — J449 Chronic obstructive pulmonary disease, unspecified: Secondary | ICD-10-CM | POA: Diagnosis not present

## 2022-07-10 DIAGNOSIS — E669 Obesity, unspecified: Secondary | ICD-10-CM | POA: Diagnosis not present

## 2022-07-10 DIAGNOSIS — E8881 Metabolic syndrome: Secondary | ICD-10-CM | POA: Diagnosis not present

## 2022-07-10 DIAGNOSIS — J9611 Chronic respiratory failure with hypoxia: Secondary | ICD-10-CM | POA: Diagnosis not present

## 2022-07-10 DIAGNOSIS — I10 Essential (primary) hypertension: Secondary | ICD-10-CM

## 2022-07-10 LAB — POCT GLYCOSYLATED HEMOGLOBIN (HGB A1C)
HbA1c POC (<> result, manual entry): 6 % (ref 4.0–5.6)
HbA1c, POC (controlled diabetic range): 6 % (ref 0.0–7.0)
HbA1c, POC (prediabetic range): 6 % (ref 5.7–6.4)
Hemoglobin A1C: 6 % — AB (ref 4.0–5.6)

## 2022-07-10 LAB — POCT URINALYSIS DIP (CLINITEK)
Blood, UA: NEGATIVE
Glucose, UA: NEGATIVE mg/dL
Ketones, POC UA: NEGATIVE mg/dL
Leukocytes, UA: NEGATIVE
Nitrite, UA: NEGATIVE
POC PROTEIN,UA: NEGATIVE
Spec Grav, UA: >=1.03 — AB (ref 1.010–1.025)
Urobilinogen, UA: 0.2 E.U./dL
pH, UA: 6 (ref 5.0–8.0)

## 2022-07-10 MED ORDER — SEMAGLUTIDE (1 MG/DOSE) 4 MG/3ML ~~LOC~~ SOPN: 1.0000 mg | PEN_INJECTOR | SUBCUTANEOUS | 11 refills | Status: DC

## 2022-07-10 MED ORDER — CARVEDILOL 6.25 MG PO TABS: 6.2500 mg | ORAL_TABLET | Freq: Two times a day (BID) | ORAL | 3 refills | Status: DC

## 2022-07-10 MED ORDER — GABAPENTIN 400 MG PO CAPS: 400.0000 mg | ORAL_CAPSULE | Freq: Two times a day (BID) | ORAL | 3 refills | Status: DC

## 2022-07-10 MED ORDER — NAPROXEN 500 MG PO TABS
500.0000 mg | ORAL_TABLET | Freq: Two times a day (BID) | ORAL | 3 refills | Status: DC
Start: 2022-07-10 — End: 2023-02-22

## 2022-07-10 NOTE — Progress Notes (Signed)
Patient [facility]  Established Patient Office Visit  Subjective   Patient ID: Kenneth Casey, male    DOB: 05/29/1956  Age: 66 y.o. MRN: [id number]  Chief Complaint  Patient presents with   Follow-up    Pt is here for 6 months follow up visit. Pt is requesting refill naproxen,gabapentin    Kenneth Casey is a 66 year old male with a medical history significant for COPD, chronic diastolic CHF, chronic respiratory failure with hypoxia on nocturnal oxygen, prediabetes, and morbid obesity presents for follow-up of chronic conditions.  Patient states that he has been doing well and is without complaints on today.  COPD: Patient has a history of COPD. He periodically experiences wheezing and shortness of breath. He is on home oxygen. Patient is well managed by his pulmonology team at [facility].  Patient has not had any recent COPD exacerbations.   Prediabetes: Patient has a history of prediabetes.  He is not on any antidiabetic medications.  He was previously on a ketogenic diet and has been unsuccessful.  Lately, patient has been mostly eating an unhealthy diet.  He does not exercise.  He remains active around his home.  He is severely obese.  He denies any polydipsia, polyuria, or polyphagia.  Patient has some numbness and tingling to bilateral lower extremities.    Patient Active Problem List   Diagnosis Date Noted   Chronic respiratory failure with hypoxia and hypercapnia ([place]) 12/21/2015   COPD GOLD III 12/14/2015   Chronic diastolic CHF (congestive heart failure) ([place]) 54/00/8676   Diastolic dysfunction 19/50/9326   Morbid obesity due to excess calories ([place]) 12/08/2015   Leukocytosis 12/08/2015   On home oxygen therapy 12/08/2015   Bilateral edema of lower extremity 12/08/2015   Sinus tachycardia 12/08/2015   Insomnia 12/08/2015   Past Medical History:  Diagnosis Date   COPD (chronic obstructive pulmonary disease) (HCC)    Obese     Pneumonia    Past Surgical History:  Procedure Laterality Date   CHOLECYSTECTOMY     Social History   Tobacco Use   Smoking status: Former    Packs/day: 2.00    Years: 30.00    Total pack years: 60.00    Types: Cigarettes    Quit date: 01/08/2011    Years since quitting: 11.5   Smokeless tobacco: Never  Vaping Use   Vaping Use: Former  Substance Use Topics   Alcohol use: No    Alcohol/week: 0.0 standard drinks of alcohol   Drug use: No   Social History   Socioeconomic History   Marital status: Married    Spouse name: Not on file   Number of children: Not on file   Years of education: Not on file   Highest education level: Not on file  Occupational History   Not on file  Tobacco Use   Smoking status: Former    Packs/day: 2.00    Years: 30.00    Total pack years: 60.00    Types: Cigarettes    Quit date: 01/08/2011    Years since quitting: 11.5   Smokeless tobacco: Never  Vaping Use   Vaping Use: Former  Substance and Sexual Activity   Alcohol use: No    Alcohol/week: 0.0 standard drinks of alcohol   Drug use: No   Sexual activity: Yes    Birth control/protection: None  Other Topics Concern   Not on file  Social History Narrative   Not on file  Social Determinants of Health   Financial Resource Strain: Not on file  Food Insecurity: Not on file  Transportation Needs: Not on file  Physical Activity: Not on file  Stress: Not on file  Social Connections: Not on file  Intimate Partner Violence: Not on file   Family Status  Relation Name Status   Mother  (Not Specified)   Father  (Not Specified)   Mat Aunt  (Not Specified)   Annamarie Major  (Not Specified)   Family History  Problem Relation Age of Onset   Cancer Mother        breast cancer, lung cancer   Diabetes Father    Cancer Maternal Aunt    Heart disease Paternal Uncle    Allergies  Allergen Reactions   Adhesive [Tape] Rash      Review of Systems  Constitutional: Negative.   HENT:  Negative.    Eyes: Negative.   Respiratory:  Positive for shortness of breath and wheezing.   Cardiovascular: Negative.   Gastrointestinal: Negative.   Genitourinary: Negative.   Skin: Negative.   Neurological: Negative.   Psychiatric/Behavioral:  The patient is nervous/anxious.       Objective:     BP (!) 146/81 (BP Location: Left Arm, Patient Position: Sitting, Cuff Size: Large)   Pulse 74   Temp (!) 97.5 F (36.4 C)   Ht '5\' 9"'  (1.753 m)   Wt (!) 301 lb 3.2 oz (136.6 kg)   SpO2 92%   BMI 44.48 kg/m  BP Readings from Last 3 Encounters:  07/10/22 (!) 146/81  03/30/22 (!) 161/83  03/05/22 124/72   Wt Readings from Last 3 Encounters:  07/10/22 (!) 301 lb 3.2 oz (136.6 kg)  03/30/22 (!) 300 lb 12.8 oz (136.4 kg)  03/05/22 299 lb 12.8 oz (136 kg)      Physical Exam Constitutional:      Appearance: He is obese.  Eyes:     Pupils: Pupils are equal, round, and reactive to light.  Cardiovascular:     Rate and Rhythm: Normal rate and regular rhythm.     Pulses: Normal pulses.  Pulmonary:     Effort: Pulmonary effort is normal.  Abdominal:     General: Bowel sounds are normal.     Comments: Abdominal obesity, abdomen pendulous  Musculoskeletal:        General: Normal range of motion.  Skin:    General: Skin is warm.  Neurological:     General: No focal deficit present.     Mental Status: Mental status is at baseline.  Psychiatric:        Mood and Affect: Mood normal.        Behavior: Behavior normal.        Thought Content: Thought content normal.        Judgment: Judgment normal.     Results for orders placed or performed in visit on 07/10/22  POCT glycosylated hemoglobin (Hb A1C)  Result Value Ref Range   Hemoglobin A1C 6.0 (A) 4.0 - 5.6 %   HbA1c POC (<> result, manual entry) 6.0 4.0 - 5.6 %   HbA1c, POC (prediabetic range) 6.0 5.7 - 6.4 %   HbA1c, POC (controlled diabetic range) 6.0 0.0 - 7.0 %    Last CBC Lab Results  Component Value Date   WBC  6.7 06/21/2021   HGB 15.6 06/21/2021   HCT 48.3 06/21/2021   MCV 88 06/21/2021   MCH 28.3 06/21/2021   RDW 13.1 06/21/2021  PLT 262 03/47/4259   Last metabolic panel Lab Results  Component Value Date   GLUCOSE 90 06/21/2021   NA 141 06/21/2021   K 4.9 06/21/2021   CL 102 06/21/2021   CO2 24 01/14/2019   BUN 15 06/21/2021   CREATININE 0.84 06/21/2021   EGFR 99 06/21/2021   CALCIUM 9.0 06/21/2021   PHOS 5.0 (H) 11/18/2015   PROT 6.7 06/21/2021   ALBUMIN 4.3 06/21/2021   LABGLOB 2.4 06/21/2021   AGRATIO 1.8 06/21/2021   BILITOT 0.4 06/21/2021   ALKPHOS 77 06/21/2021   AST 14 06/21/2021   ALT 16 01/14/2019   ANIONGAP 7 12/14/2015   Last lipids Lab Results  Component Value Date   CHOL 144 06/21/2021   HDL 33 (L) 06/21/2021   LDLCALC 98 06/21/2021   TRIG 61 06/21/2021   CHOLHDL 4.4 06/21/2021   Last hemoglobin A1c Lab Results  Component Value Date   HGBA1C 6.0 (A) 07/10/2022   HGBA1C 6.0 07/10/2022   HGBA1C 6.0 07/10/2022   HGBA1C 6.0 07/10/2022   Last thyroid functions Lab Results  Component Value Date   TSH 2.564 12/08/2015   Last vitamin D No results found for: "25OHVITD2", "25OHVITD3", "VD25OH" Last vitamin B12 and Folate No results found for: "VITAMINB12", "FOLATE"    The 10-year ASCVD risk score (Arnett DK, et al., 2019) is: 24.7%    Assessment & Plan:   Problem List Items Addressed This Visit   None Visit Diagnoses     Prediabetes    -  Primary   Relevant Orders   POCT URINALYSIS DIP (CLINITEK)   POCT glycosylated hemoglobin (Hb A1C) (Completed)     1. Prediabetes Patient's hemoglobin A1c is 6.0.  Patient was initially diagnosed with prediabetes 3 years prior.  He refuses to restart metformin.  We will start a trial of semaglutide once weekly.  Discussed at length.  Discussed potential side effects and how to administer this medication at length. - POCT URINALYSIS DIP (CLINITEK) - POCT glycosylated hemoglobin (Hb A1C) - Semaglutide, 1  MG/DOSE, 4 MG/3ML SOPN; Inject 1 mg as directed once a week.  Dispense: 3 mL; Refill: 11 - Lipid Panel - CMP and Liver  2. Morbid obesity due to excess calories [facility]) Patient is severely obese.  Discussed increases in weight over the past year.  Patient given written information on diet and exercise.  We will consider referral to diabetes and nutrition.  Patient states that he knows what to eat and has been on diets over the years.  3. Abdominal obesity and metabolic syndrome Refer to above [facility] Weights   07/10/22 1115  Weight: (!) 301 lb 3.2 oz (136.6 kg)     4. Epididymitis Patient was previously prescribed naproxen for this problem.  He is requesting a refill on that medication. - naproxen (NAPROSYN) 500 MG tablet; Take 1 tablet (500 mg total) by mouth 2 (two) times daily with a meal.  Dispense: 60 tablet; Refill: 3  5. Neuropathy  - gabapentin (NEURONTIN) 400 MG capsule; Take 1 capsule (400 mg total) by mouth 2 (two) times daily.  Dispense: 180 capsule; Refill: 3  6. Essential hypertension BP (!) 146/81 (BP Location: Left Arm, Patient Position: Sitting, Cuff Size: Large)   Pulse 74   Temp (!) 97.5 F (36.4 C)   Ht '5\' 9"'  (1.753 m)   Wt (!) 301 lb 3.2 oz (136.6 kg)   SpO2 92%   BMI 44.48 kg/m   - carvedilol (COREG) 6.25 MG tablet; Take 1 tablet (  6.25 mg total) by mouth 2 (two) times daily with a meal.  Dispense: 180 tablet; Refill: 3 - CMP and Liver   Return in about 6 months (around 01/10/2023) for obesity, pre-diabetes.   Donia Pounds  APRN, MSN, FNP-C Patient [facility] [address] [place], [place] [address] [phone]

## 2022-07-10 NOTE — Patient Instructions (Signed)
Semaglutide Injection What is this medication? SEMAGLUTIDE (SEM a GLOO tide) treats type 2 diabetes. It works by increasing insulin levels in your body, which decreases your blood sugar (glucose). It also reduces the amount of sugar released into the blood and slows down your digestion. It can also be used to lower the risk of heart attack and stroke in people with type 2 diabetes. Changes to diet and exercise are often combined with this medication. This medicine may be used for other purposes; ask your health care provider or pharmacist if you have questions. COMMON BRAND NAME(S): OZEMPIC What should I tell my care team before I take this medication? They need to know if you have any of these conditions: Endocrine tumors (MEN 2) or if someone in your family had these tumors Eye disease, vision problems History of pancreatitis Kidney disease Stomach problems Thyroid cancer or if someone in your family had thyroid cancer An unusual or allergic reaction to semaglutide, other medications, foods, dyes, or preservatives Pregnant or trying to get pregnant Breast-feeding How should I use this medication? This medication is for injection under the skin of your upper leg (thigh), stomach area, or upper arm. It is given once every week (every 7 days). You will be taught how to prepare and give this medication. Use exactly as directed. Take your medication at regular intervals. Do not take it more often than directed. If you use this medication with insulin, you should inject this medication and the insulin separately. Do not mix them together. Do not give the injections right next to each other. Change (rotate) injection sites with each injection. It is important that you put your used needles and syringes in a special sharps container. Do not put them in a trash can. If you do not have a sharps container, call your pharmacist or care team to get one. A special MedGuide will be given to you by the  pharmacist with each prescription and refill. Be sure to read this information carefully each time. This medication comes with INSTRUCTIONS FOR USE. Ask your pharmacist for directions on how to use this medication. Read the information carefully. Talk to your pharmacist or care team if you have questions. Talk to your care team about the use of this medication in children. Special care may be needed. Overdosage: If you think you have taken too much of this medicine contact a poison control center or emergency room at once. NOTE: This medicine is only for you. Do not share this medicine with others. What if I miss a dose? If you miss a dose, take it as soon as you can within 5 days after the missed dose. Then take your next dose at your regular weekly time. If it has been longer than 5 days after the missed dose, do not take the missed dose. Take the next dose at your regular time. Do not take double or extra doses. If you have questions about a missed dose, contact your care team for advice. What may interact with this medication? Other medications for diabetes Many medications may cause changes in blood sugar, these include: Alcohol containing beverages Antiviral medications for HIV or AIDS Aspirin and aspirin-like medications Certain medications for blood pressure, heart disease, irregular heart beat Chromium Diuretics Male hormones, such as estrogens or progestins, birth control pills Fenofibrate Gemfibrozil Isoniazid Lanreotide Male hormones or anabolic steroids MAOIs like Carbex, Eldepryl, Marplan, Nardil, and Parnate Medications for weight loss Medications for allergies, asthma, cold, or cough Medications for depression,   anxiety, or psychotic disturbances Niacin Nicotine NSAIDs, medications for pain and inflammation, like ibuprofen or naproxen Octreotide Pasireotide Pentamidine Phenytoin Probenecid Quinolone antibiotics such as ciprofloxacin, levofloxacin, ofloxacin Some  herbal dietary supplements Steroid medications such as prednisone or cortisone Sulfamethoxazole; trimethoprim Thyroid hormones Some medications can hide the warning symptoms of low blood sugar (hypoglycemia). You may need to monitor your blood sugar more closely if you are taking one of these medications. These include: Beta-blockers, often used for high blood pressure or heart problems (examples include atenolol, metoprolol, propranolol) Clonidine Guanethidine Reserpine This list may not describe all possible interactions. Give your health care provider a list of all the medicines, herbs, non-prescription drugs, or dietary supplements you use. Also tell them if you smoke, drink alcohol, or use illegal drugs. Some items may interact with your medicine. What should I watch for while using this medication? Visit your care team for regular checks on your progress. Drink plenty of fluids while taking this medication. Check with your care team if you get an attack of severe diarrhea, nausea, and vomiting. The loss of too much body fluid can make it dangerous for you to take this medication. A test called the HbA1C (A1C) will be monitored. This is a simple blood test. It measures your blood sugar control over the last 2 to 3 months. You will receive this test every 3 to 6 months. Learn how to check your blood sugar. Learn the symptoms of low and high blood sugar and how to manage them. Always carry a quick-source of sugar with you in case you have symptoms of low blood sugar. Examples include hard sugar candy or glucose tablets. Make sure others know that you can choke if you eat or drink when you develop serious symptoms of low blood sugar, such as seizures or unconsciousness. They must get medical help at once. Tell your care team if you have high blood sugar. You might need to change the dose of your medication. If you are sick or exercising more than usual, you might need to change the dose of your  medication. Do not skip meals. Ask your care team if you should avoid alcohol. Many nonprescription cough and cold products contain sugar or alcohol. These can affect blood sugar. Pens should never be shared. Even if the needle is changed, sharing may result in passing of viruses like hepatitis or HIV. Wear a medical ID bracelet or chain, and carry a card that describes your disease and details of your medication and dosage times. Do not become pregnant while taking this medication. Women should inform their care team if they wish to become pregnant or think they might be pregnant. There is a potential for serious side effects to an unborn child. Talk to your care team for more information. What side effects may I notice from receiving this medication? Side effects that you should report to your care team as soon as possible: Allergic reactions--skin rash, itching, hives, swelling of the face, lips, tongue, or throat Change in vision Dehydration--increased thirst, dry mouth, feeling faint or lightheaded, headache, dark yellow or brown urine Gallbladder problems--severe stomach pain, nausea, vomiting, fever Heart palpitations--rapid, pounding, or irregular heartbeat Kidney injury--decrease in the amount of urine, swelling of the ankles, hands, or feet Pancreatitis--severe stomach pain that spreads to your back or gets worse after eating or when touched, fever, nausea, vomiting Thyroid cancer--new mass or lump in the neck, pain or trouble swallowing, trouble breathing, hoarseness Side effects that usually do not require medical   attention (report to your care team if they continue or are bothersome): Diarrhea Loss of appetite Nausea Stomach pain Vomiting This list may not describe all possible side effects. Call your doctor for medical advice about side effects. You may report side effects to FDA at 1-800-FDA-1088. Where should I keep my medication? Keep out of the reach of children. Store  unopened pens in a refrigerator between 2 and 8 degrees C (36 and 46 degrees F). Do not freeze. Protect from light and heat. After you first use the pen, it can be stored for 56 days at room temperature between 15 and 30 degrees C (59 and 86 degrees F) or in a refrigerator. Throw away your used pen after 56 days or after the expiration date, whichever comes first. Do not store your pen with the needle attached. If the needle is left on, medication may leak from the pen. NOTE: This sheet is a summary. It may not cover all possible information. If you have questions about this medicine, talk to your doctor, pharmacist, or health care provider.  2023 Elsevier/Gold Standard (2021-03-02 00:00:00)  

## 2022-07-11 ENCOUNTER — Telehealth: Payer: Self-pay

## 2022-07-11 LAB — LIPID PANEL
Chol/HDL Ratio: 5.7 ratio — ABNORMAL HIGH (ref 0.0–5.0)
Cholesterol, Total: 181 mg/dL (ref 100–199)
HDL: 32 mg/dL — ABNORMAL LOW (ref >39)
LDL Chol Calc (NIH): 134 mg/dL — ABNORMAL HIGH (ref 0–99)
Triglycerides: 81 mg/dL (ref 0–149)
VLDL Cholesterol Cal: 15 mg/dL (ref 5–40)

## 2022-07-11 LAB — CMP AND LIVER
ALT: 14 IU/L (ref 0–44)
AST: 22 IU/L (ref 0–40)
Albumin: 4.3 g/dL (ref 3.9–4.9)
Alkaline Phosphatase: 77 IU/L (ref 44–121)
BUN: 22 mg/dL (ref 8–27)
Bilirubin Total: 0.5 mg/dL (ref 0.0–1.2)
Bilirubin, Direct: 0.14 mg/dL (ref 0.00–0.40)
CO2: 24 mmol/L (ref 20–29)
Calcium: 9.2 mg/dL (ref 8.6–10.2)
Chloride: 100 mmol/L (ref 96–106)
Creatinine, Ser: 0.97 mg/dL (ref 0.76–1.27)
Glucose: 92 mg/dL (ref 70–99)
Potassium: 4.1 mmol/L (ref 3.5–5.2)
Sodium: 141 mmol/L (ref 134–144)
Total Protein: 6.7 g/dL (ref 6.0–8.5)
eGFR: 88 mL/min/{1.73_m2} (ref >59)

## 2022-07-11 NOTE — Telephone Encounter (Signed)
PA started for Ozempic (1 MG/DOSE) 4MG /3ML pen-injectors waiting on patient  insurance approval  Request Reference Number: . OZEMPIC INJ 4MG /3ML is approved through 07/12/2023

## 2022-07-26 NOTE — Progress Notes (Signed)
Reviewed all laboratory values.  Cholesterol results show LDL (bad cholesterol) elevated at 134 from 98 one year ago.  Remind patient that during his office visit, it was found that hemoglobin A1c is 6.0, which is consistent with prediabetes.  Patient was started on Ozempic 1 mg weekly, will recheck hemoglobin A1c in 3 months.  Discussed the importance of following a low-fat, low carbohydrate diet divided over small meals throughout the day. Follow up in clinic as scheduled.   Nolon Nations  APRN, MSN, FNP-C Patient Care [facility] [address] [place], [place] [address] [phone]

## 2022-07-31 ENCOUNTER — Other Ambulatory Visit: Payer: Self-pay | Admitting: Family Medicine

## 2022-07-31 DIAGNOSIS — F411 Generalized anxiety disorder: Secondary | ICD-10-CM

## 2022-08-01 ENCOUNTER — Other Ambulatory Visit: Payer: Self-pay | Admitting: Nurse Practitioner

## 2022-08-10 DIAGNOSIS — J9611 Chronic respiratory failure with hypoxia: Secondary | ICD-10-CM | POA: Diagnosis not present

## 2022-08-10 DIAGNOSIS — J449 Chronic obstructive pulmonary disease, unspecified: Secondary | ICD-10-CM | POA: Diagnosis not present

## 2022-08-14 ENCOUNTER — Encounter: Payer: Self-pay | Admitting: Internal Medicine

## 2022-08-14 ENCOUNTER — Ambulatory Visit (INDEPENDENT_AMBULATORY_CARE_PROVIDER_SITE_OTHER): Payer: Medicaid Other | Admitting: Internal Medicine

## 2022-08-14 DIAGNOSIS — J9612 Chronic respiratory failure with hypercapnia: Secondary | ICD-10-CM | POA: Diagnosis not present

## 2022-08-14 DIAGNOSIS — J9611 Chronic respiratory failure with hypoxia: Secondary | ICD-10-CM

## 2022-08-14 DIAGNOSIS — Z87891 Personal history of nicotine dependence: Secondary | ICD-10-CM | POA: Diagnosis not present

## 2022-08-14 DIAGNOSIS — J449 Chronic obstructive pulmonary disease, unspecified: Secondary | ICD-10-CM | POA: Diagnosis not present

## 2022-08-14 NOTE — Patient Instructions (Signed)
No change in medications   Please schedule a follow up visit in 6  months but call sooner if needed  - [place] office  

## 2022-08-14 NOTE — Assessment & Plan Note (Signed)
Quit smoking 2022 - rec LDSCT 08/14/2022 >>>   Low-dose CT lung cancer screening is recommended for patients who are 72-64 years of age with a 20+ pack-year history of smoking and who are currently smoking or quit <=15 years ago. No coughing up blood  No unintentional weight loss of > 15 pounds in the last 6 months - pt is eligible for scanning yearly until 2026   Discussed in detail all the  indications, usual  risks and alternatives  relative to the benefits with patient who wants to think about it and address at next ov - will call in meantime if changes his mind         Each maintenance medication was reviewed in detail including emphasizing most importantly the difference between maintenance and prns and under what circumstances the prns are to be triggered using an action plan format where appropriate.  Total time for H and P, chart review, counseling, reviewing hfa/neb /02  device(s) and generating customized AVS unique to this office visit / same day charting = 32 min

## 2022-08-14 NOTE — Assessment & Plan Note (Addendum)
Quit smoking 2012  12/20/2015  extensive coaching HFA effectiveness =    75% > continue symbicort 160 2bid - Spirometry 01/17/2016  FEV1 1.62 (44%)  Ratio 61 - 05/28/2016   try bevespi 2bid > improved 09/03/2016 but decided liked symbicort better = 80 bid> increased to 160 2bid  08/13/17  - 04/14/2018  After extensive coaching inhaler device  effectiveness =   90% p 4 tries  - 07/14/2018  After extensive coaching inhaler device  effectiveness =    90%  - PFT's  07/14/2018  FEV1 1.80 (57 % ) ratio 60  p 9 % improvement from saba p nothing prior to study with DLCO  94 % corrects to 121 % for alv volume   And erv 16% @ 290 lbs   Group D (now reclassified as E) in terms of symptom/risk and laba/lama/ICS  therefore appropriate rx at this point >>>  breztri offered but prefers symbicort 160 2bid and approp saba   - The proper method of use, as well as anticipated side effects, of a metered-dose inhaler were discussed and demonstrated to the patient using teach back method

## 2022-08-14 NOTE — Progress Notes (Signed)
Subjective:     Patient ID: Kenneth Casey, male   DOB: [birth year]    MRN: [id number]    Brief patient profile:  42  yowm furniture salesman Quit smoking around 2012 at wt around 210 and did fine until winter of 2016 cough/sob self rx with albuterol helped some hfa and neb then added BREO  And proved to have GOLD III 01/2016.     History of Present Illness  12/21/2015 1st [facility] office visit/ Javontae Marlette   Chief Complaint  Patient presents with   HFU    Pt states that his breathing has improved some, but not baseline for him. He c/o chest congestion and has had some cough with clear sputum. He has been wheezing some. He is using albuterol inhaler 4-5 x per day and neb 2-4 x per day.   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  Plan B= Backup Only use your albuterol as a rescue medication to  Plan C = crisis Only use nebulizer if you try the ventolin first and it doesn't work  Try prilosec otc 20mg   Take 30-60 min before first meal of the day and Pepcid ac (famotidine) 20 mg one @  bedtime until cough is completely gone for at least a week without the need for cough suppression GERD diet             03/05/2022  f/u ov/Ikran Patman re: GOLD 2 copd   maint on symb 160  for flare since 01/27/22  Chief Complaint  Patient presents with   Follow-up    Pt states he has had complaints of congestion in chest. States that he does feel some better after recent prednisone and zpak. States he does cough occasionally and is now getting up clear phlegm.  Dyspnea:  still limited by knees and hips  Cough: white mucus worse p supper / on mucienx dm better but still coughing  Sleeping: 45 degree electric bed  SABA use: twice daily  02: 2lpm hs and very  little in house  Covid status:   vax x 4  Rec For cough continue mucinex dm 1200 mg every 12 hours as needed Prednisone 10 mg take  4 each am x 2 days,   2 each am x 2 days,  1 each am x 2 days and stop  Pantoprazole (protonix) 40 mg   Take  30-60 min before first meal of the day and Pepcid (famotidine)  20 mg after supper until the cough is gone for a week GERD diet  Work on inhaler technique:  Cxr: ok     08/14/2022  f/u ov/[place] re: GOLD 2   maint on symbicort 160 2bid   Chief Complaint  Patient presents with   Follow-up    Pt states he is now on Ozempic. No new issues with breathing.   Dyspnea:  still limited by L >R  Cough: very little / mucoid am  Sleeping: 45 degree electric bed, limited by vertigo SABA use: neb not lately / rare hfa  02: 2lpm hs/ usually above 90% daytime     No obvious day to day or daytime variability or assoc excess/ purulent sputum  or mucus plugs or hemoptysis or cp or chest tightness, subjective wheeze or overt sinus or hb symptoms.   Sleeping as above  without nocturnal  or early am exacerbation  of respiratory  c/o's or need for noct saba. Also denies any obvious fluctuation of symptoms with weather or environmental changes or other aggravating or alleviating factors except as outlined above   No unusual exposure hx or h/o childhood pna/ asthma or knowledge of premature birth.  Current Allergies, Complete Past Medical History, Past Surgical History, Family History, and Social History were reviewed in [employer] record.  ROS  The following are not active complaints unless bolded Hoarseness, sore throat, dysphagia, dental problems, itching, sneezing,  nasal congestion or discharge of excess mucus or purulent secretions, ear ache,   fever, chills, sweats, unintended wt loss or wt gain, classically pleuritic or exertional cp,  orthopnea pnd or arm/hand swelling  or leg swelling, presyncope, palpitations, abdominal pain, anorexia,  nausea, vomiting, diarrhea  or change in bowel habits or change in bladder habits, change in stools or change in urine, dysuria, hematuria,  rash, arthralgias, visual complaints, headache, numbness, weakness or ataxia or problems with walking or coordination,  change in mood or  memory.        Current Meds  Medication Sig   acetaminophen (TYLENOL) 500 MG tablet Take 1,000 mg by mouth every 6 (six) hours as needed for moderate pain.   albuterol (PROVENTIL) (2.5 MG/3ML) 0.083% nebulizer solution USE 1 VIAL IN NEBULIZER EVERY 4 HOURS AS NEEDED FOR WHEEZING FOR SHORTNESS OF BREATH   albuterol (VENTOLIN HFA) 108 (90 Base) MCG/ACT inhaler Inhale 1-2 puffs into the lungs every 6 (six) hours as needed for wheezing or shortness of breath.   ALPRAZolam (XANAX) 0.25 MG tablet TAKE 1 TABLET BY MOUTH AT BEDTIME AS NEEDED FOR ANXIETY   busPIRone (BUSPAR) 10 MG tablet Take 10 mg by mouth 2 (two) times daily.   carvedilol (COREG) 6.25 MG tablet Take 1 tablet (6.25 mg total) by mouth 2 (two) times daily with a meal.   dextromethorphan-guaiFENesin (MUCINEX DM) 30-600 MG 12hr tablet Take 1 tablet by mouth 2 (two) times daily.   diclofenac (VOLTAREN) 75 MG EC tablet Take 1 tablet (75 mg total) by mouth 2 (two) times daily.   famotidine (PEPCID) 20 MG tablet One after supper   fluticasone (FLONASE) 50 MCG/ACT nasal spray USE 2 SPRAY(S) IN EACH NOSTRIL ONCE DAILY AS NEEDED FOR ALLERGIES   gabapentin (NEURONTIN) 400 MG capsule Take 1 capsule (400 mg total) by mouth 2 (two) times daily.   methylPREDNISolone (MEDROL DOSEPAK) 4 MG TBPK tablet 6 day dose pack - take as directed   naproxen (NAPROSYN) 500 MG tablet Take 1 tablet (500 mg total) by mouth 2 (two) times daily with a meal.   OXYGEN 2 with sleep and exertion   pantoprazole (PROTONIX) 40 MG tablet Take 1 tablet (40 mg total) by mouth daily. Take 30-60 min before first meal of the day   predniSONE (DELTASONE) 10 MG tablet Take  4 each am x 2 days,   2 each am x 2  days,  1 each am x 2 days and stop   Semaglutide, 1 MG/DOSE, 4 MG/3ML SOPN Inject 1 mg as directed once a week.   SYMBICORT 160-4.5 MCG/ACT inhaler INHALE 2 PUFFS BY MOUTH FIRST THING IN THE MORNING THEN ANOTHER 2 PUFFS ABOUT 12 HOURS LATER   tamsulosin (FLOMAX) 0.4 MG CAPS capsule Take 0.4 mg by mouth daily.  triamcinolone ointment (KENALOG) 0.5 % APPLY  OINTMENT TOPICALLY TWICE DAILY                Objective:   Physical Exam  wts     08/14/2022        298  03/05/2022    299  07/14/2020      253  07/15/2019     232  04/16/2016          309 >  05/28/2016  305 > 09/03/2016   318 >  02/13/2017  323 >  08/13/2017  326 > 01/13/2018  328 > 04/14/2018  312 > 07/14/2018    290 > 10/20/2018  267 > 01/17/2016          311   12/20/15 308 lb (139.708 kg)  12/13/15 315 lb (142.883 kg)  12/08/15 312 lb (141.522 kg)      Vital signs reviewed  08/14/2022  - Note at rest 02 sats  94% on RA   General appearance:    obese pleasant wm nad    HEENT : Oropharynx  clear  Nasal turbinates nl    NECK :  without  apparent JVD/ palpable Nodes/TM    LUNGS: no acc muscle use,  Min barrel  contour chest wall with bilateral  insp /exp rhonchi   and  without cough on insp or exp maneuvers and min  Hyperresonant  to  percussion bilaterally    CV:  RRR  no s3 or murmur or increase in P2, and no edema   ABD:  obese soft and nontender with pos end  insp Hoover's  in the supine position.  No bruits or organomegaly appreciated   MS:  Nl gait/ ext warm without deformities Or obvious joint restrictions  calf tenderness, cyanosis or clubbing     SKIN: warm and dry without lesions    NEURO:  alert, approp, nl sensorium with  no motor or cerebellar deficits apparent.           Assessment:

## 2022-08-14 NOTE — Assessment & Plan Note (Signed)
On 02 3lpm since d/c 11/18/2015 - HCO3  34  12/14/15  - 01/17/2016 sats mid 90's RA at rest so rec 2lpm sleeping / exerting but none needed at rest  - 04/16/2016  Walked 4lpm pulsed x 3 laps @ 185 ft each stopped due to end of study, mild sob, no desat  - 08/13/2017  Walked RA x 3 laps @ 185 ft each stopped due to  End of study, nl to mod fast pace, no desat , min sob   - 01/13/2018  Walked RA x 3 laps @ 185 ft each stopped due to  End of study, nl pace, no sob or desat   - HCO3  01/13/2018  =  30 c/w only mild hypercarbia   - ONO RA  04/01/18 desat x 5 h and 20 min so needs 2lpm   - 04/14/2018  Walked RA x 3 laps @ 185 ft each stopped due to  End of study, sats 88% corrected on 2lpm  - 04/22/18  ono on 3lpm = 1h 44 min  So rec 4lpm and repeat on 4lpm > done 05/01/18  only 12 min sat @ < 89% so no change rx  - 10/20/2018 pt titrated down to 2lpm with wt loss and "sleeps great"   rx as of 08/14/2022  = 2lpm and prn daytime but not needing

## 2022-08-17 DIAGNOSIS — N401 Enlarged prostate with lower urinary tract symptoms: Secondary | ICD-10-CM | POA: Diagnosis not present

## 2022-08-24 DIAGNOSIS — N401 Enlarged prostate with lower urinary tract symptoms: Secondary | ICD-10-CM | POA: Diagnosis not present

## 2022-08-24 DIAGNOSIS — R351 Nocturia: Secondary | ICD-10-CM | POA: Diagnosis not present

## 2022-08-24 DIAGNOSIS — N528 Other male erectile dysfunction: Secondary | ICD-10-CM | POA: Diagnosis not present

## 2022-09-05 ENCOUNTER — Encounter: Payer: Self-pay | Admitting: Nurse Practitioner

## 2022-09-05 DIAGNOSIS — M722 Plantar fascial fibromatosis: Secondary | ICD-10-CM | POA: Insufficient documentation

## 2022-09-05 NOTE — Assessment & Plan Note (Signed)
-   diclofenac (VOLTAREN) 75 MG EC tablet; Take 1 tablet (75 mg total) by mouth 2 (two) times daily.  Dispense: 30 tablet; Refill: 0 - DG Foot Complete Right   - may use ice packs   - may try shoe inserts  Follow up:  Follow up as needed

## 2022-09-06 ENCOUNTER — Other Ambulatory Visit: Payer: Self-pay | Admitting: Nurse Practitioner

## 2022-09-06 DIAGNOSIS — F411 Generalized anxiety disorder: Secondary | ICD-10-CM

## 2022-09-09 DIAGNOSIS — J449 Chronic obstructive pulmonary disease, unspecified: Secondary | ICD-10-CM | POA: Diagnosis not present

## 2022-09-09 DIAGNOSIS — J9611 Chronic respiratory failure with hypoxia: Secondary | ICD-10-CM | POA: Diagnosis not present

## 2022-09-10 ENCOUNTER — Other Ambulatory Visit: Payer: Self-pay | Admitting: Nurse Practitioner

## 2022-09-12 ENCOUNTER — Other Ambulatory Visit: Payer: Self-pay

## 2022-09-12 DIAGNOSIS — F411 Generalized anxiety disorder: Secondary | ICD-10-CM

## 2022-09-13 ENCOUNTER — Other Ambulatory Visit: Payer: Self-pay | Admitting: Nurse Practitioner

## 2022-09-13 DIAGNOSIS — F411 Generalized anxiety disorder: Secondary | ICD-10-CM

## 2022-09-13 MED ORDER — ALPRAZOLAM 0.25 MG PO TABS: ORAL_TABLET | ORAL | 0 refills | Status: DC

## 2022-10-09 ENCOUNTER — Other Ambulatory Visit: Payer: Self-pay | Admitting: Nurse Practitioner

## 2022-10-09 DIAGNOSIS — J302 Other seasonal allergic rhinitis: Secondary | ICD-10-CM

## 2022-10-09 DIAGNOSIS — F411 Generalized anxiety disorder: Secondary | ICD-10-CM

## 2022-10-10 DIAGNOSIS — J449 Chronic obstructive pulmonary disease, unspecified: Secondary | ICD-10-CM | POA: Diagnosis not present

## 2022-10-10 DIAGNOSIS — J9611 Chronic respiratory failure with hypoxia: Secondary | ICD-10-CM | POA: Diagnosis not present

## 2022-10-18 ENCOUNTER — Other Ambulatory Visit: Payer: Self-pay | Admitting: Family Medicine

## 2022-10-18 DIAGNOSIS — F411 Generalized anxiety disorder: Secondary | ICD-10-CM

## 2022-10-18 MED ORDER — ALPRAZOLAM 0.25 MG PO TABS: ORAL_TABLET | ORAL | 0 refills | Status: DC

## 2022-10-18 NOTE — Progress Notes (Signed)
Reviewed PDMP substance reporting system prior to prescribing opiate medications. No inconsistencies noted.  Meds ordered this encounter  Medications   ALPRAZolam (XANAX) 0.25 MG tablet    Sig: Take one tablet by mouth at bedtime as needed for anxiety    Dispense:  30 tablet    Refill:  0    Order Specific Question:   Supervising Provider    Answer:   Quentin Angst [[id number]]   Nolon Nations  APRN, MSN, FNP-C Patient Care [facility] [address] [place], [place] [address] [phone]

## 2022-11-04 ENCOUNTER — Other Ambulatory Visit: Payer: Self-pay | Admitting: Nurse Practitioner

## 2022-11-09 ENCOUNTER — Telehealth: Payer: Self-pay

## 2022-11-09 DIAGNOSIS — J449 Chronic obstructive pulmonary disease, unspecified: Secondary | ICD-10-CM | POA: Diagnosis not present

## 2022-11-09 DIAGNOSIS — J9611 Chronic respiratory failure with hypoxia: Secondary | ICD-10-CM | POA: Diagnosis not present

## 2022-11-09 NOTE — Telephone Encounter (Signed)
Caller & Relationship to patient:  MRN #  [id number]   Call Back Number:   Date of Last Office Visit: 07/10/22  Date of Next Office Visit: 01/08/23   Medication(s) to be Refilled: Buspirone  Preferred Pharmacy:  [facility]  ** Please notify patient to allow 48-72 hours to process** **Let patient know to contact pharmacy at the end of the day to make sure medication is ready. ** **If patient has not been seen in a year or longer, book an appointment **Advise to use MyChart for refill requests OR to contact their pharmacy

## 2022-11-09 NOTE — Telephone Encounter (Signed)
[facility] is requesting to fill pt buspirone . Please advise [facility]

## 2022-11-12 ENCOUNTER — Other Ambulatory Visit: Payer: Self-pay | Admitting: Family Medicine

## 2022-11-12 MED ORDER — BUSPIRONE HCL 10 MG PO TABS: 10.0000 mg | ORAL_TABLET | Freq: Two times a day (BID) | ORAL | 1 refills | Status: DC

## 2022-11-12 NOTE — Progress Notes (Signed)
Meds ordered this encounter  Medications   busPIRone (BUSPAR) 10 MG tablet    Sig: Take 1 tablet (10 mg total) by mouth 2 (two) times daily.    Dispense:  180 tablet    Refill:  1    Order Specific Question:   Supervising Provider    Answer:   Quentin Angst [[id number]]   Nolon Nations  APRN, MSN, FNP-C Patient Care [facility] [address] [place], [place] [address] [phone]

## 2022-11-17 ENCOUNTER — Other Ambulatory Visit: Payer: Self-pay | Admitting: Family Medicine

## 2022-11-17 DIAGNOSIS — F411 Generalized anxiety disorder: Secondary | ICD-10-CM

## 2022-11-20 NOTE — Telephone Encounter (Signed)
[facility] is requesting to fill pt xanax. Please advise [facility] °

## 2022-12-10 DIAGNOSIS — J449 Chronic obstructive pulmonary disease, unspecified: Secondary | ICD-10-CM | POA: Diagnosis not present

## 2022-12-10 DIAGNOSIS — J9611 Chronic respiratory failure with hypoxia: Secondary | ICD-10-CM | POA: Diagnosis not present

## 2022-12-28 IMAGING — DX DG CHEST 2V
2 series · 2 of 2 positions shown · non-contrast
Comparison: 07/14/2021

CLINICAL DATA: Cough

EXAM:
CHEST - 2 VIEW

[chest pa]
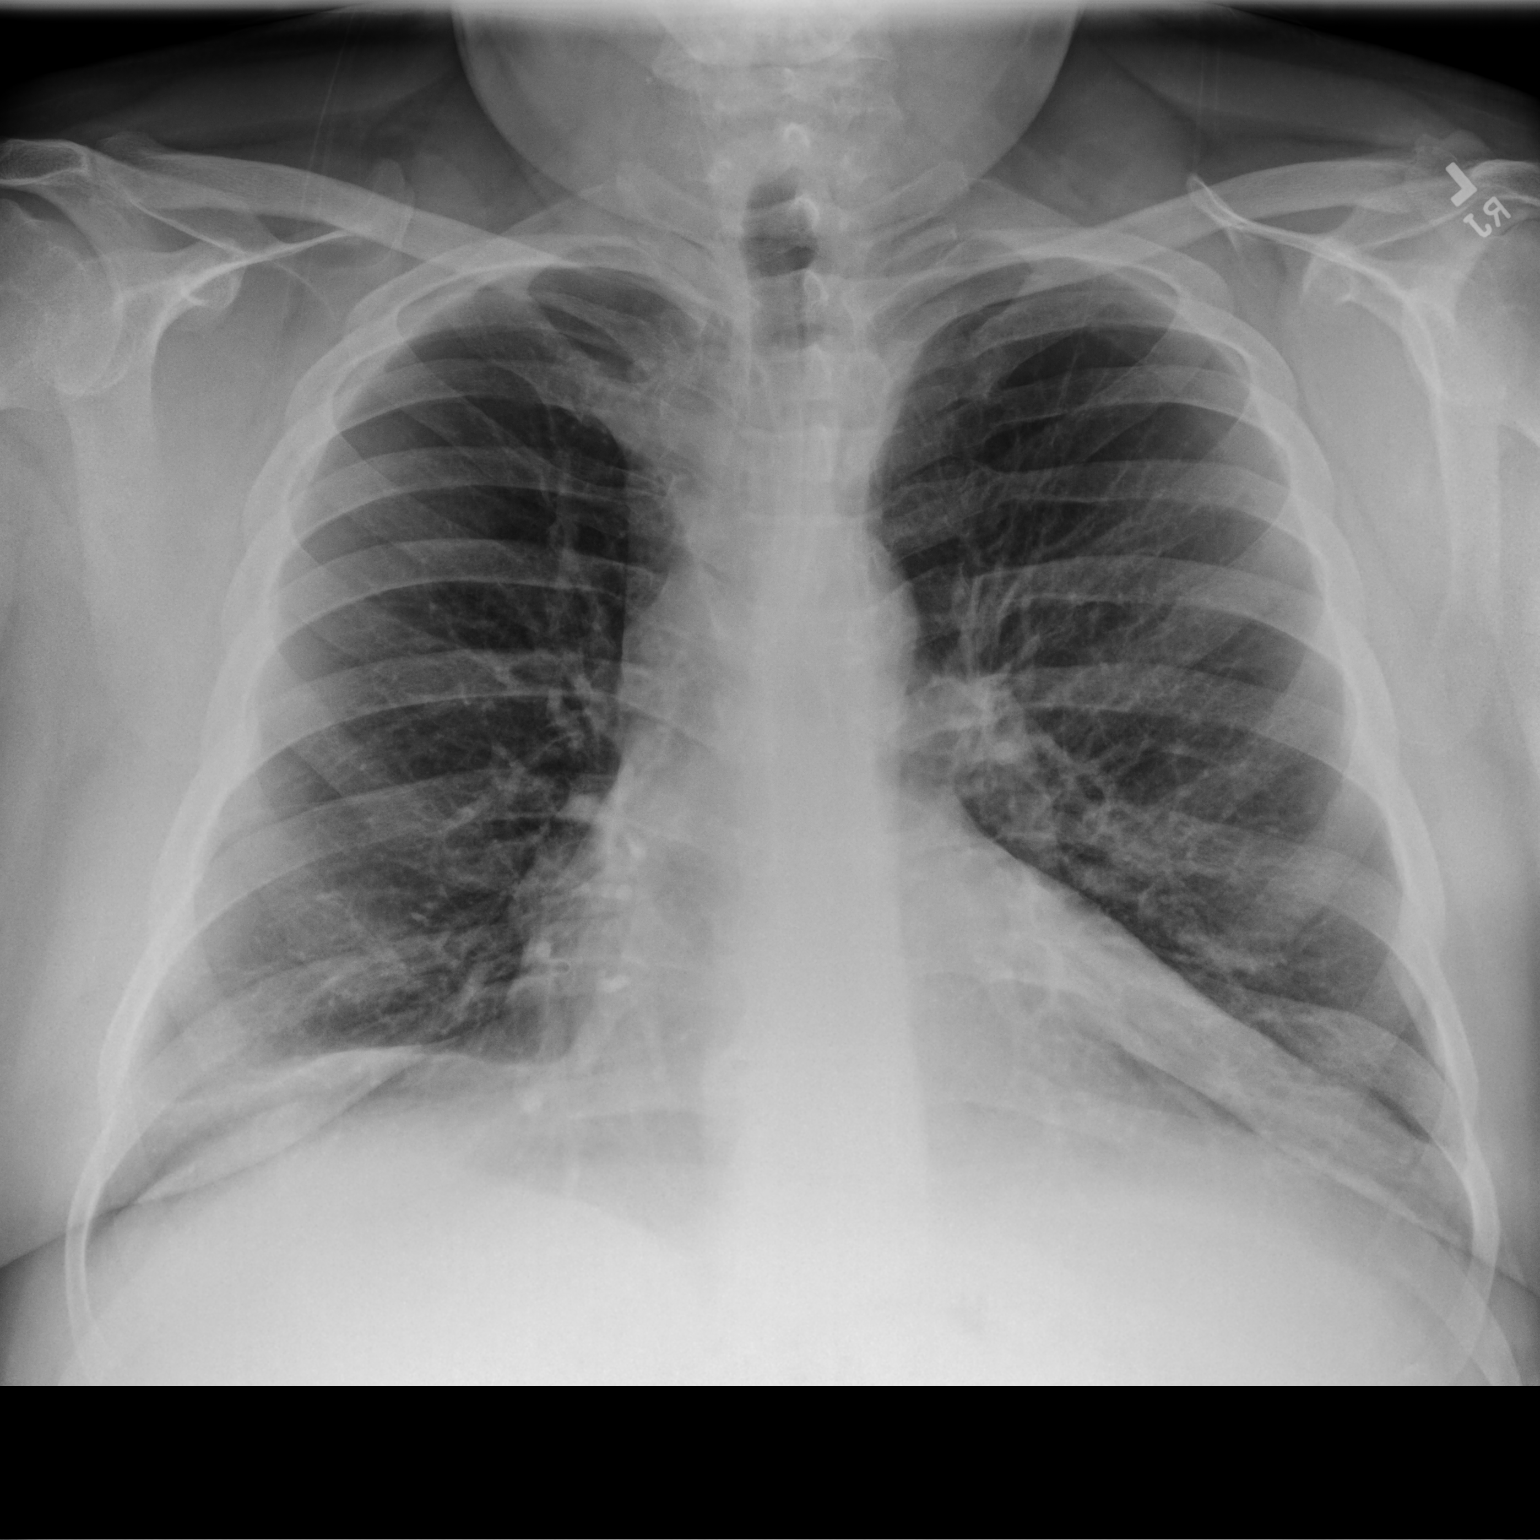

[chest lat]
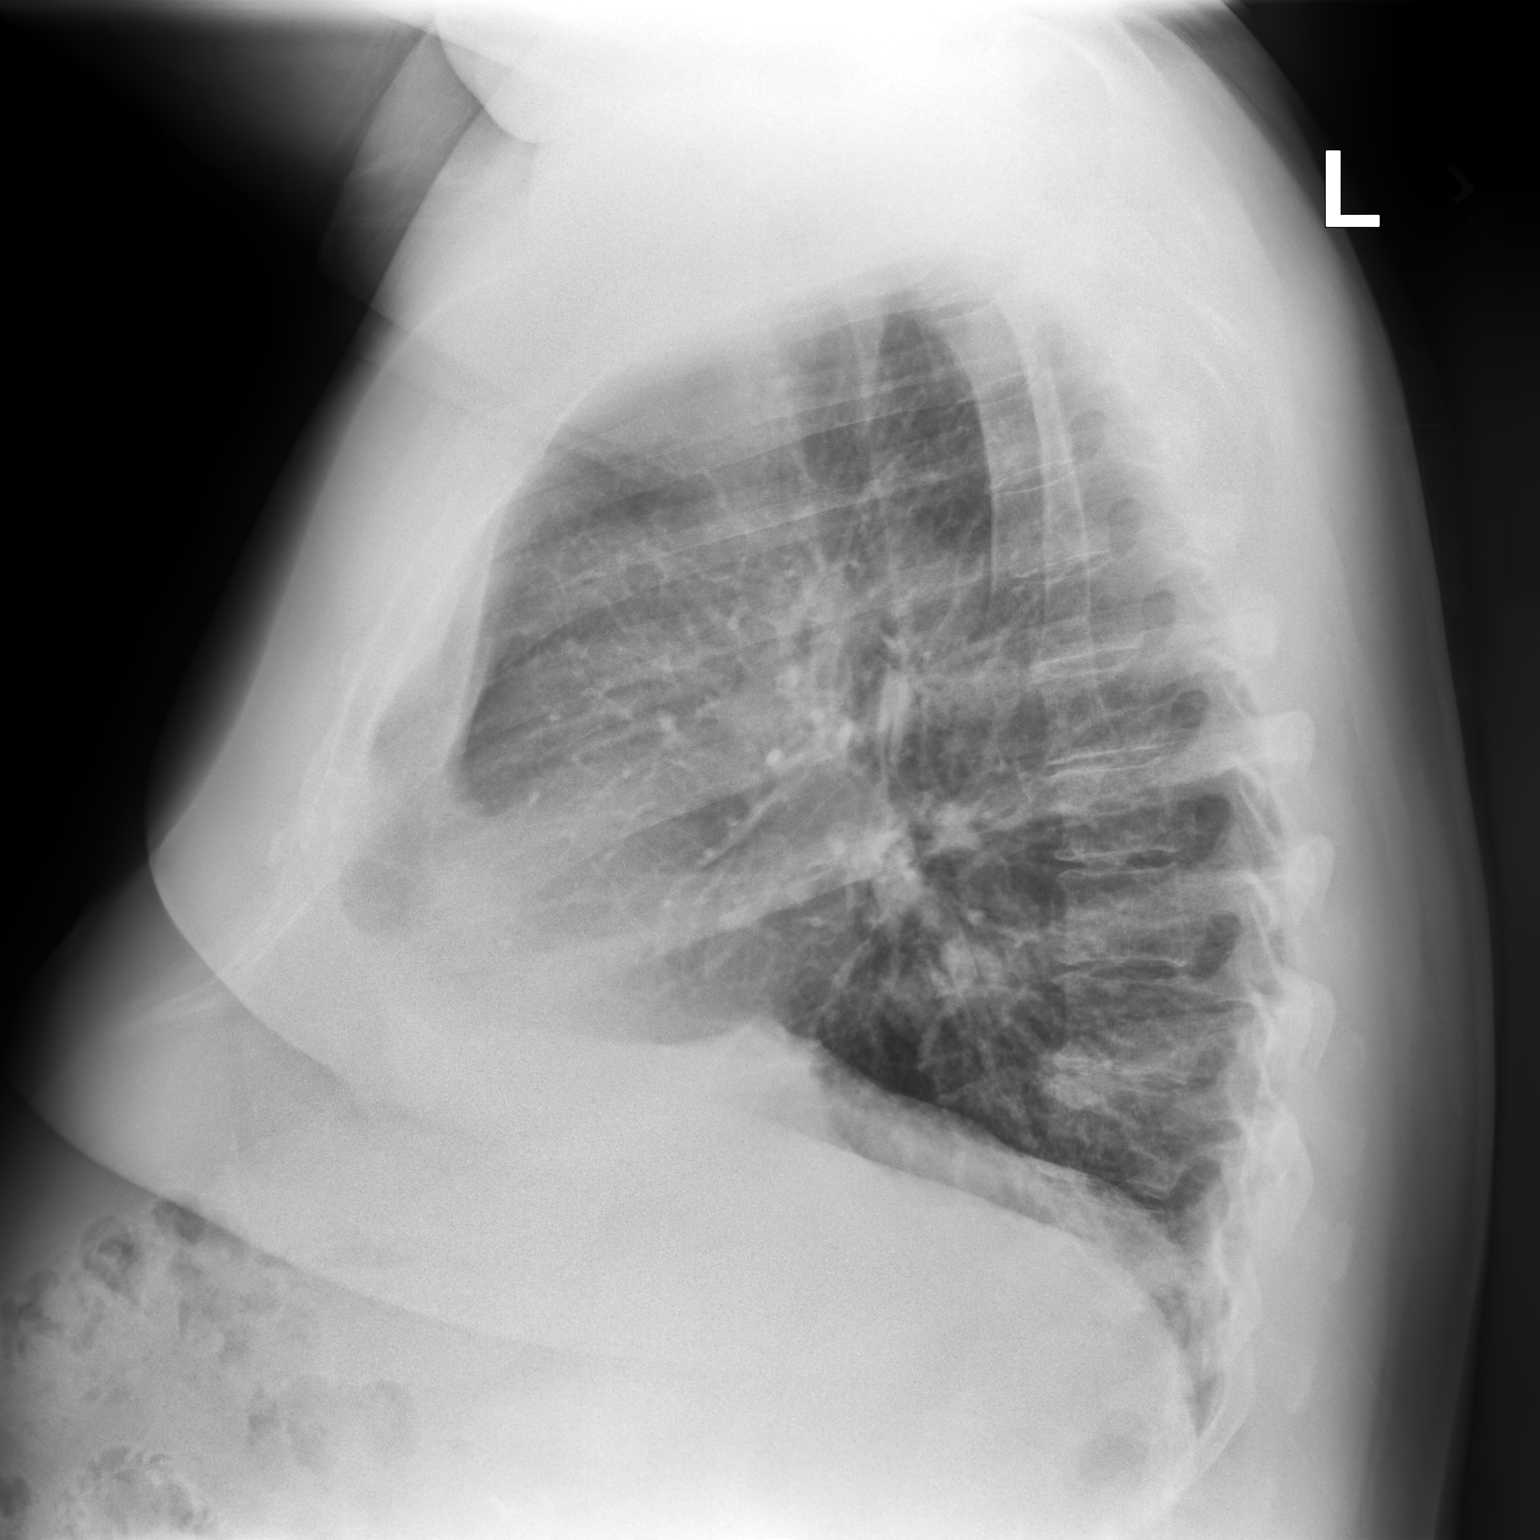

[2 of 2 positions shown; findings below may reference images not displayed]

FINDINGS: The heart size and mediastinal contours are within normal limits.
Both lungs are clear. The visualized skeletal structures are
unremarkable.
IMPRESSION: No active cardiopulmonary disease.

## 2022-12-29 ENCOUNTER — Other Ambulatory Visit: Payer: Self-pay | Admitting: Nurse Practitioner

## 2022-12-29 ENCOUNTER — Other Ambulatory Visit: Payer: Self-pay | Admitting: Family Medicine

## 2022-12-29 DIAGNOSIS — F411 Generalized anxiety disorder: Secondary | ICD-10-CM

## 2022-12-29 DIAGNOSIS — J302 Other seasonal allergic rhinitis: Secondary | ICD-10-CM

## 2023-01-03 ENCOUNTER — Other Ambulatory Visit: Payer: Self-pay

## 2023-01-03 ENCOUNTER — Other Ambulatory Visit: Payer: Self-pay | Admitting: Family Medicine

## 2023-01-03 DIAGNOSIS — F411 Generalized anxiety disorder: Secondary | ICD-10-CM

## 2023-01-03 NOTE — Telephone Encounter (Signed)
Please advise [facility] 

## 2023-01-08 ENCOUNTER — Ambulatory Visit (INDEPENDENT_AMBULATORY_CARE_PROVIDER_SITE_OTHER): Payer: Medicaid Other | Admitting: Family Medicine

## 2023-01-08 ENCOUNTER — Encounter: Payer: Self-pay | Admitting: Family Medicine

## 2023-01-08 VITALS — BP 137/79 | HR 87 | Temp 97.7°F | Ht 68.0 in | Wt 301.2 lb

## 2023-01-08 DIAGNOSIS — J440 Chronic obstructive pulmonary disease with acute lower respiratory infection: Secondary | ICD-10-CM | POA: Diagnosis not present

## 2023-01-08 DIAGNOSIS — E785 Hyperlipidemia, unspecified: Secondary | ICD-10-CM | POA: Diagnosis not present

## 2023-01-08 DIAGNOSIS — I10 Essential (primary) hypertension: Secondary | ICD-10-CM

## 2023-01-08 DIAGNOSIS — R7303 Prediabetes: Secondary | ICD-10-CM

## 2023-01-08 DIAGNOSIS — F411 Generalized anxiety disorder: Secondary | ICD-10-CM | POA: Diagnosis not present

## 2023-01-08 DIAGNOSIS — J209 Acute bronchitis, unspecified: Secondary | ICD-10-CM | POA: Diagnosis not present

## 2023-01-08 MED ORDER — ACCU-CHEK GUIDE ME W/DEVICE KIT: 1.0000 | PACK | Freq: Two times a day (BID) | 0 refills | Status: AC

## 2023-01-08 MED ORDER — ACCU-CHEK GUIDE VI STRP: ORAL_STRIP | 12 refills | Status: AC

## 2023-01-08 MED ORDER — ACCU-CHEK SOFTCLIX LANCETS MISC: 12 refills | Status: AC

## 2023-01-08 NOTE — Patient Instructions (Signed)

## 2023-01-08 NOTE — Progress Notes (Signed)
Established Patient Office Visit  Subjective   Patient ID: Kenneth Casey, male    DOB: [birth year]  Age: 67 y.o. MRN: [id number]  Chief Complaint  Patient presents with   Follow-up    Hypertension and pre diabetes    Kenneth Casey is a 67 year old male with a medical history significant for COPD, chronic respiratory failure on home oxygen, prediabetes, and morbid obesity presents for follow-up of chronic conditions.  Patient is accompanied by his wife.  Patient is doing well and is without complaint.  He has been taking all prescribed medications consistently.  He has not been following a carbohydrate modified diet or exercising.  Patient has been assisting his spouse who is currently undergoing treatments for breast cancer.  Patient has a history of COPD.  He is followed by pulmonology.  Patient mostly uses home oxygen at night.  He denies any wheezing, persistent cough, or shortness of breath today.    Patient Active Problem List   Diagnosis Date Noted   Plantar fasciitis 09/05/2022   Former smoker 08/14/2022   Chronic respiratory failure with hypoxia and hypercapnia ([place]) 12/21/2015   COPD GOLD 2 12/14/2015   Chronic diastolic CHF (congestive heart failure) ([place]) 0000000   Diastolic dysfunction 99991111   Morbid obesity due to excess calories ([place]) 12/08/2015   Leukocytosis 12/08/2015   On home oxygen therapy 12/08/2015   Bilateral edema of lower extremity 12/08/2015   Sinus tachycardia 12/08/2015   Insomnia 12/08/2015   Past Medical History:  Diagnosis Date   COPD (chronic obstructive pulmonary disease) (HCC)    Obese    Pneumonia    Past Surgical History:  Procedure Laterality Date   CHOLECYSTECTOMY     Social History   Tobacco Use   Smoking status: Former    Packs/day: 2.00    Years: 30.00    Total pack years: 60.00    Types: Cigarettes    Quit date: 01/08/2011    Years since quitting: 12.0   Smokeless tobacco: Never  Vaping Use   Vaping Use: Former   Substance Use Topics   Alcohol use: No    Alcohol/week: 0.0 standard drinks of alcohol   Drug use: No   Social History   Socioeconomic History   Marital status: Married    Spouse name: Not on file   Number of children: Not on file   Years of education: Not on file   Highest education level: Not on file  Occupational History   Not on file  Tobacco Use   Smoking status: Former    Packs/day: 2.00    Years: 30.00    Total pack years: 60.00    Types: Cigarettes    Quit date: 01/08/2011    Years since quitting: 12.0   Smokeless tobacco: Never  Vaping Use   Vaping Use: Former  Substance and Sexual Activity   Alcohol use: No    Alcohol/week: 0.0 standard drinks of alcohol   Drug use: No   Sexual activity: Yes    Birth control/protection: None  Other Topics Concern   Not on file  Social History Narrative   Not on file   Social Determinants of Health   Financial Resource Strain: Not on file  Food Insecurity: Not on file  Transportation Needs: Not on file  Physical Activity: Not on file  Stress: Not on file  Social Connections: Not on file  Intimate Partner Violence: Not on file   Family Status  Relation Name Status   Mother  (  Not Specified)   Father  (Not Specified)   Mat Aunt  (Not Specified)   Annamarie Major  (Not Specified)   Family History  Problem Relation Age of Onset   Cancer Mother        breast cancer, lung cancer   Diabetes Father    Cancer Maternal Aunt    Heart disease Paternal Uncle    Allergies  Allergen Reactions   Adhesive [Tape] Rash      Review of Systems  HENT: Negative.    Respiratory: Negative.    Cardiovascular: Negative.   Gastrointestinal: Negative.   Genitourinary: Negative.   Musculoskeletal: Negative.   Neurological: Negative.   Psychiatric/Behavioral: Negative.        Objective:     BP 137/79   Pulse 87   Temp 97.7 F (36.5 C)   Ht 5' 8"$  (1.727 m)   Wt (!) 301 lb 3.2 oz (136.6 kg)   SpO2 97%   BMI 45.80 kg/m  BP  Readings from Last 3 Encounters:  01/21/23 (!) 140/88  01/08/23 137/79  08/14/22 132/70   Wt Readings from Last 3 Encounters:  01/21/23 (!) 305 lb 3.2 oz (138.4 kg)  01/08/23 (!) 301 lb 3.2 oz (136.6 kg)  08/14/22 298 lb 3.2 oz (135.3 kg)      Physical Exam Constitutional:      Appearance: He is obese.  Eyes:     Pupils: Pupils are equal, round, and reactive to light.  Cardiovascular:     Rate and Rhythm: Normal rate and regular rhythm.     Pulses: Normal pulses.  Pulmonary:     Effort: Pulmonary effort is normal.  Abdominal:     General: Bowel sounds are normal.  Musculoskeletal:        General: Normal range of motion.  Skin:    General: Skin is warm.  Neurological:     General: No focal deficit present.     Mental Status: He is alert. Mental status is at baseline.  Psychiatric:        Mood and Affect: Mood normal.        Behavior: Behavior normal.        Thought Content: Thought content normal.        Judgment: Judgment normal.      Results for orders placed or performed in visit on 01/08/23  Lipid Panel  Result Value Ref Range   Cholesterol, Total 171 100 - 199 mg/dL   Triglycerides 69 0 - 149 mg/dL   HDL 30 (L) >39 mg/dL   VLDL Cholesterol Cal 13 5 - 40 mg/dL   LDL Chol Calc (NIH) 128 (H) 0 - 99 mg/dL   Chol/HDL Ratio 5.7 (H) 0.0 - 5.0 ratio  Comprehensive metabolic panel  Result Value Ref Range   Glucose 94 70 - 99 mg/dL   BUN 17 8 - 27 mg/dL   Creatinine, Ser 0.92 0.76 - 1.27 mg/dL   eGFR 93 >59 mL/min/1.73   BUN/Creatinine Ratio 18 10 - 24   Sodium 137 134 - 144 mmol/L   Potassium 4.8 3.5 - 5.2 mmol/L   Chloride 98 96 - 106 mmol/L   CO2 26 20 - 29 mmol/L   Calcium 9.5 8.6 - 10.2 mg/dL   Total Protein 6.9 6.0 - 8.5 g/dL   Albumin 4.4 3.9 - 4.9 g/dL   Globulin, Total 2.5 1.5 - 4.5 g/dL   Albumin/Globulin Ratio 1.8 1.2 - 2.2   Bilirubin Total 0.4 0.0 - 1.2 mg/dL  Alkaline Phosphatase 86 44 - 121 IU/L   AST 11 0 - 40 IU/L   ALT 11 0 - 44 IU/L     Last CBC Lab Results  Component Value Date   WBC 6.7 06/21/2021   HGB 15.6 06/21/2021   HCT 48.3 06/21/2021   MCV 88 06/21/2021   MCH 28.3 06/21/2021   RDW 13.1 06/21/2021   PLT 262 [id number]   Last metabolic panel Lab Results  Component Value Date   GLUCOSE 94 01/08/2023   NA 137 01/08/2023   K 4.8 01/08/2023   CL 98 01/08/2023   CO2 26 01/08/2023   BUN 17 01/08/2023   CREATININE 0.92 01/08/2023   EGFR 93 01/08/2023   CALCIUM 9.5 01/08/2023   PHOS 5.0 (H) 11/18/2015   PROT 6.9 01/08/2023   ALBUMIN 4.4 01/08/2023   LABGLOB 2.5 01/08/2023   AGRATIO 1.8 01/08/2023   BILITOT 0.4 01/08/2023   ALKPHOS 86 01/08/2023   AST 11 01/08/2023   ALT 11 01/08/2023   ANIONGAP 7 12/14/2015   Last lipids Lab Results  Component Value Date   CHOL 171 01/08/2023   HDL 30 (L) 01/08/2023   LDLCALC 128 (H) 01/08/2023   TRIG 69 01/08/2023   CHOLHDL 5.7 (H) 01/08/2023   Last hemoglobin A1c Lab Results  Component Value Date   HGBA1C 6.0 (A) 07/10/2022   HGBA1C 6.0 07/10/2022   HGBA1C 6.0 07/10/2022   HGBA1C 6.0 07/10/2022   Last thyroid functions Lab Results  Component Value Date   TSH 2.564 12/08/2015   Last vitamin D No results found for: "25OHVITD2", "25OHVITD3", "VD25OH" Last vitamin B12 and Folate No results found for: "VITAMINB12", "FOLATE"    The 10-year ASCVD risk score (Arnett DK, et al., 2019) is: 33.6%    Assessment & Plan:   Problem List Items Addressed This Visit       Other   Morbid obesity due to excess calories ([place])   Other Visit Diagnoses     Prediabetes    -  Primary   Relevant Medications   Accu-Chek Softclix Lancets lancets   Blood Glucose Monitoring Suppl (ACCU-CHEK GUIDE ME) w/Device KIT   glucose blood (ACCU-CHEK GUIDE) test strip   Other Relevant Orders   POCT glycosylated hemoglobin (Hb A1C)   Essential hypertension       Relevant Orders   Comprehensive metabolic panel (Completed)   Generalized anxiety disorder       COPD  with acute bronchitis ([place])       Hyperlipidemia LDL goal <100       Relevant Orders   Lipid Panel (Completed)     1. Prediabetes  - POCT glycosylated hemoglobin (Hb A1C) - Accu-Chek Softclix Lancets lancets; Use as instructed  Dispense: 200 each; Refill: 12 - Blood Glucose Monitoring Suppl (ACCU-CHEK GUIDE ME) w/Device KIT; 1 each by Does not apply route 2 (two) times daily.  Dispense: 1 kit; Refill: 0 - glucose blood (ACCU-CHEK GUIDE) test strip; Use as instructed  Dispense: 200 each; Refill: 12  2. Essential hypertension BP 137/79   Pulse 87   Temp 97.7 F (36.5 C)   Ht 5' 8"$  (1.727 m)   Wt (!) 301 lb 3.2 oz (136.6 kg)   SpO2 97%   BMI 45.80 kg/m  - Continue medication, monitor blood pressure at home. Continue DASH diet.  Reminder to go to the ER if any CP, SOB, nausea, dizziness, severe HA, changes vision/speech, left arm numbness and tingling and jaw pain.   - Comprehensive  metabolic panel  3. Morbid obesity due to excess calories [facility]) The patient is asked to make an attempt to improve diet and exercise patterns to aid in medical management of this problem.   4. Generalized anxiety disorder Continue alprazolam as needed  5. COPD with acute bronchitis ([place]) Continue to follow-up with pulmonology as scheduled  6. Hyperlipidemia LDL goal <100  - Lipid Panel   Return in about 3 months (around 04/09/2023).   Donia Pounds  APRN, MSN, FNP-C Patient [facility] [address] [place], [place] [address] [phone]

## 2023-01-09 LAB — LIPID PANEL
Chol/HDL Ratio: 5.7 ratio — ABNORMAL HIGH (ref 0.0–5.0)
Cholesterol, Total: 171 mg/dL (ref 100–199)
HDL: 30 mg/dL — ABNORMAL LOW (ref >39)
LDL Chol Calc (NIH): 128 mg/dL — ABNORMAL HIGH (ref 0–99)
Triglycerides: 69 mg/dL (ref 0–149)
VLDL Cholesterol Cal: 13 mg/dL (ref 5–40)

## 2023-01-09 LAB — COMPREHENSIVE METABOLIC PANEL
ALT: 11 IU/L (ref 0–44)
AST: 11 IU/L (ref 0–40)
Albumin/Globulin Ratio: 1.8 (ref 1.2–2.2)
Albumin: 4.4 g/dL (ref 3.9–4.9)
Alkaline Phosphatase: 86 IU/L (ref 44–121)
BUN/Creatinine Ratio: 18 (ref 10–24)
BUN: 17 mg/dL (ref 8–27)
Bilirubin Total: 0.4 mg/dL (ref 0.0–1.2)
CO2: 26 mmol/L (ref 20–29)
Calcium: 9.5 mg/dL (ref 8.6–10.2)
Chloride: 98 mmol/L (ref 96–106)
Creatinine, Ser: 0.92 mg/dL (ref 0.76–1.27)
Globulin, Total: 2.5 g/dL (ref 1.5–4.5)
Glucose: 94 mg/dL (ref 70–99)
Potassium: 4.8 mmol/L (ref 3.5–5.2)
Sodium: 137 mmol/L (ref 134–144)
Total Protein: 6.9 g/dL (ref 6.0–8.5)
eGFR: 93 mL/min/{1.73_m2} (ref >59)

## 2023-01-10 DIAGNOSIS — J9611 Chronic respiratory failure with hypoxia: Secondary | ICD-10-CM | POA: Diagnosis not present

## 2023-01-10 DIAGNOSIS — J449 Chronic obstructive pulmonary disease, unspecified: Secondary | ICD-10-CM | POA: Diagnosis not present

## 2023-01-21 ENCOUNTER — Encounter: Payer: Self-pay | Admitting: Internal Medicine

## 2023-01-21 ENCOUNTER — Ambulatory Visit (INDEPENDENT_AMBULATORY_CARE_PROVIDER_SITE_OTHER): Payer: Medicaid Other | Admitting: Internal Medicine

## 2023-01-21 VITALS — BP 140/88 | HR 82 | Ht 68.0 in | Wt 305.2 lb

## 2023-01-21 DIAGNOSIS — J9611 Chronic respiratory failure with hypoxia: Secondary | ICD-10-CM

## 2023-01-21 DIAGNOSIS — Z87891 Personal history of nicotine dependence: Secondary | ICD-10-CM

## 2023-01-21 DIAGNOSIS — J449 Chronic obstructive pulmonary disease, unspecified: Secondary | ICD-10-CM

## 2023-01-21 DIAGNOSIS — J9612 Chronic respiratory failure with hypercapnia: Secondary | ICD-10-CM

## 2023-01-21 MED ORDER — SPIRIVA RESPIMAT 2.5 MCG/ACT IN AERS: INHALATION_SPRAY | RESPIRATORY_TRACT | 11 refills | Status: DC

## 2023-01-21 NOTE — Progress Notes (Signed)
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     History of Present Illness  12/21/2015 [facility] office visit/ Kenneth Casey   Chief Complaint  Patient presents with   HFU    Pt states that his breathing has improved some, but not baseline for him. He c/o chest congestion and has had some cough with clear sputum. He has been wheezing some. He is using albuterol inhaler 4-5 x per day and neb 2-4 x per day.   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  Plan B= Backup Only use your albuterol as a rescue medication to  Plan C = crisis Only use nebulizer if you try the ventolin first and it doesn't work  Try prilosec otc 15m  Take 30-60 min before first meal of the day and Pepcid ac (famotidine) 20 mg one @  bedtime until cough is completely gone for at least a week without the need for cough suppression GERD diet          01/21/2023  f/u ov/Kenneth Casey office/Kenneth Casey re: GOLD 2  maint on symbicort 160   Chief Complaint  Patient presents with   Follow-up    Breathing is about the same since last ov   Dyspnea:  no regular walking  Cough: worse x one week /non productive/ already  on protonix / pepcid  Sleeping: starts out in recliner 60 degrees/ bed is electric also but never < 30 degrees withougt cough/ sob  SABA use: not much  02: 2lpm hs only  Lung cancer screening: ordered 01/21/2023    No obvious day to day or daytime variability or assoc excess/  purulent sputum or mucus plugs or hemoptysis or cp or chest tightness, subjective wheeze or overt sinus or hb symptoms.     Also denies any obvious fluctuation of symptoms with weather or environmental changes or other aggravating or alleviating factors except as outlined above   No unusual exposure hx or h/o childhood pna/ asthma or knowledge of premature birth.  Current Allergies, Complete Past Medical History, Past Surgical History, Family History, and Social History were reviewed in [employer].  ROS  The following are not active complaints unless bolded Hoarseness, sore throat, dysphagia, dental problems, itching, sneezing,  nasal congestion or discharge of excess mucus or purulent secretions, ear ache,   fever, chills, sweats, unintended wt loss or wt gain, classically pleuritic or exertional cp,  orthopnea pnd or arm/hand swelling  or leg swelling, presyncope, palpitations, abdominal pain, anorexia, nausea, vomiting, diarrhea  or change in bowel habits or change in bladder habits, change in stools or change in urine, dysuria, hematuria,  rash, arthralgias, visual complaints, headache, numbness, weakness or ataxia or problems with walking or coordination,  change in mood or  memory.        Current Meds  Medication Sig   Accu-Chek Softclix Lancets lancets Use as instructed   acetaminophen (TYLENOL) 500 MG tablet Take 1,000 mg by mouth every 6 (six) hours as needed for moderate pain.   albuterol (PROVENTIL) (2.5 MG/3ML) 0.083% nebulizer solution USE 1 VIAL IN NEBULIZER EVERY 4 HOURS AS NEEDED FOR WHEEZING FOR SHORTNESS OF BREATH   albuterol (VENTOLIN HFA) 108 (90 Base) MCG/ACT inhaler Inhale 1-2 puffs into the lungs every 6 (six) hours as needed for wheezing or shortness of breath.   ALPRAZolam (XANAX) 0.25 MG tablet TAKE 1 TABLET BY MOUTH AT BEDTIME AS NEEDED FOR ANXIETY   Blood Glucose Monitoring Suppl (ACCU-CHEK GUIDE ME) w/Device KIT 1 each by Does not apply  route 2 (two) times daily.   busPIRone (BUSPAR) 10 MG tablet Take 1 tablet (10 mg total) by mouth 2 (two) times daily.   carvedilol (COREG) 6.25 MG tablet Take 1 tablet (6.25 mg total) by mouth 2 (two) times daily with a meal.   dextromethorphan-guaiFENesin (MUCINEX DM) 30-600 MG 12hr tablet Take 1 tablet by mouth 2 (two) times daily.   diclofenac (VOLTAREN) 75 MG EC tablet Take 1 tablet (75 mg total) by mouth 2 (two) times daily.   famotidine (PEPCID) 20 MG tablet One after supper   fluticasone (FLONASE) 50 MCG/ACT nasal spray USE 2 SPRAY(S) IN EACH NOSTRIL ONCE DAILY AS NEEDED FOR ALLERGIES   gabapentin (NEURONTIN) 400 MG capsule Take 1 capsule (400 mg total) by mouth 2 (two) times daily.   glucose blood (ACCU-CHEK GUIDE) test strip Use as instructed   methylPREDNISolone (MEDROL DOSEPAK) 4 MG TBPK tablet 6 day dose pack - take as directed   naproxen (NAPROSYN) 500 MG tablet Take 1 tablet (500 mg total) by mouth 2 (two) times daily with a meal.   OXYGEN 2 with sleep and exertion   pantoprazole (PROTONIX) 40 MG tablet Take 1 tablet (40 mg total) by mouth daily. Take 30-60 min before first meal of the day   predniSONE (DELTASONE) 10 MG tablet Take  4 each am x 2 days,   2 each am x 2 days,  1 each am x 2 days and stop   Semaglutide, 1 MG/DOSE, 4 MG/3ML SOPN Inject 1 mg as directed once a week.   SYMBICORT 160-4.5 MCG/ACT inhaler INHALE 2 PUFFS BY MOUTH FIRST THING IN THE MORNING THEN ANOTHER 2 PUFFS ABOUT 12 HOURS LATER   tamsulosin (FLOMAX) 0.4 MG CAPS capsule Take 0.4 mg by mouth daily.   triamcinolone ointment (KENALOG) 0.5 % APPLY  OINTMENT TOPICALLY TWICE DAILY                 Objective:   Physical Exam  wts   01/21/2023      305   08/14/2022        298  03/05/2022    299  07/14/2020      253  07/15/2019     232  04/16/2016          309 >  05/28/2016  305 > 09/03/2016   318 >  02/13/2017  323 >  08/13/2017  326 > 01/13/2018  328 > 04/14/2018  312 > 07/14/2018    290 > 10/20/2018  267 > 01/17/2016           311   12/20/15 308 lb (139.708 kg)  12/13/15 315 lb (142.883 kg)  12/08/15 312 lb (141.522 kg)     Vital signs reviewed  01/21/2023  - Note at rest 02 sats  94%  on RA   General appearance:    obese wm    HEENT : wearing mask    NECK :  without  apparent JVD/ palpable Nodes/TM    LUNGS: no acc muscle use,  Min barrel  contour chest wall with bilateral  min insp/exp rhonchi  and  without cough on insp or exp maneuvers and min  Hyperresonant  to  percussion bilaterally    CV:  RRR  no s3 or murmur or increase in P2, and no edema   ABD:  obese soft and nontender    MS:  Nl gait/ ext warm without deformities Or obvious joint restrictions  calf tenderness, cyanosis or clubbing     SKIN: warm and dry without lesions    NEURO:  alert, approp, nl sensorium with  no motor or cerebellar deficits apparent.           Assessment:

## 2023-01-21 NOTE — Assessment & Plan Note (Signed)
PFTs 07/14/2018  erv =  16% at wt 290   Body mass index is 46.41 kg/m.  -  trending up still  Lab Results  Component Value Date   TSH 2.564 12/08/2015      Contributing to doe and risk of GERD >>>   reviewed the need and the process to achieve and maintain neg calorie balance > defer f/u primary care including intermittently monitoring thyroid status

## 2023-01-21 NOTE — Assessment & Plan Note (Signed)
Quit smoking 2012  12/20/2015  extensive coaching HFA effectiveness =    75% > continue symbicort 160 2bid - Spirometry 01/17/2016  FEV1 1.62 (44%)  Ratio 61 - 05/28/2016   try bevespi 2bid > improved 09/03/2016 but decided liked symbicort better = 80 bid> increased to 160 2bid  08/13/17  - 04/14/2018  After extensive coaching inhaler device  effectiveness =   90% p 4 tries  - 07/14/2018  After extensive coaching inhaler device  effectiveness =    90%  - PFT's  07/14/2018  FEV1 1.80 (57 % ) ratio 60  p 9 % improvement from saba p nothing prior to study with DLCO  94 % corrects to 121 % for alv volume   And erv 16% @ 290 lbs - 01/21/2023  After extensive coaching inhaler device,  effectiveness =    90% with SMI > add spiriva 2.5 2 each am    Group D (now reclassified as E) in terms of symptom/risk and laba/lama/ICS  therefore appropriate rx at this point >>>  continue symbicort 160/ add spiriva smi and approp saba  Re SABA :  I spent extra time with pt today reviewing appropriate use of albuterol for prn use on exertion with the following points: 1) saba is for relief of sob that does not improve by walking a slower pace or resting but rather if the pt does not improve after trying this first. 2) If the pt is convinced, as many are, that saba helps recover from activity faster then it's easy to tell if this is the case by re-challenging : ie stop, take the inhaler, then p 5 minutes try the exact same activity (intensity of workload) that just caused the symptoms and see if they are substantially diminished or not after saba 3) if there is an activity that reproducibly causes the symptoms, try the saba 15 min before the activity on alternate days   If in fact the saba really does help, then fine to continue to use it prn but advised may need to look closer at the maintenance regimen being used to achieve better control of airways disease with exertion.

## 2023-01-21 NOTE — Assessment & Plan Note (Addendum)
On 02 3lpm since d/c 11/18/2015 - HCO3  34  12/14/15  - 01/17/2016 sats mid 90's RA at rest so rec 2lpm sleeping / exerting but none needed at rest  - 04/16/2016  Walked 4lpm pulsed x 3 laps @ 185 ft each stopped due to end of study, mild sob, no desat  - 08/13/2017  Walked RA x 3 laps @ 185 ft each stopped due to  End of study, nl to mod fast pace, no desat , min sob   - 01/13/2018  Walked RA x 3 laps @ 185 ft each stopped due to  End of study, nl pace, no sob or desat   - HCO3  01/13/2018  =  30 c/w only mild hypercarbia   - ONO RA  04/01/18 desat x 5 h and 20 min so needs 2lpm   - 04/14/2018  Walked RA x 3 laps @ 185 ft each stopped due to  End of study, sats 88% corrected on 2lpm  - 04/22/18  ono on 3lpm = 1h 44 min  So rec 4lpm and repeat on 4lpm > done 05/01/18  only 12 min sat @ < 89% so no change rx  - 10/20/2018 pt titrated down to 2lpm with wt loss and "sleeps great"   Advised: Make sure you check your oxygen saturation  AT  your highest level of activity (not after you stop)   to be sure it stays over 90% and adjust  02 flow upward to maintain this level if needed but remember to turn it back to previous settings when you stop (to conserve your supply).   F/u q 3 m, sooner prn          Each maintenance medication was reviewed in detail including emphasizing most importantly the difference between maintenance and prns and under what circumstances the prns are to be triggered using an action plan format where appropriate.  Total time for H and P, chart review, counseling, reviewing hfa/smi/02 neb  device(s) and generating customized AVS unique to this office visit / same day charting > 30 min for multiple  refractory respiratory  symptoms

## 2023-01-21 NOTE — Patient Instructions (Addendum)
Add spiriva 2 puffs after only the AM Symbicort for 2 weeks and fill the prescription if you note better exercise tolerance on it   Make sure you check your oxygen saturation at your highest level of activity(NOT after you stop)  to be sure it stays over 88% and keep track of it at least once a week, more often if breathing getting worse, and let me know if losing ground. (Collect the dots to connect the dots approach)    Please schedule a follow up visit in 3 months but call sooner if needed

## 2023-02-02 ENCOUNTER — Other Ambulatory Visit: Payer: Self-pay | Admitting: Family Medicine

## 2023-02-02 DIAGNOSIS — F411 Generalized anxiety disorder: Secondary | ICD-10-CM

## 2023-02-04 NOTE — Telephone Encounter (Signed)
Please advise [facility] 

## 2023-02-05 ENCOUNTER — Telehealth: Payer: Self-pay

## 2023-02-05 NOTE — Telephone Encounter (Signed)
Fax from [facility] was reviewed a medication was already sent in. [facility]

## 2023-02-08 DIAGNOSIS — J449 Chronic obstructive pulmonary disease, unspecified: Secondary | ICD-10-CM | POA: Diagnosis not present

## 2023-02-08 DIAGNOSIS — J9611 Chronic respiratory failure with hypoxia: Secondary | ICD-10-CM | POA: Diagnosis not present

## 2023-02-19 ENCOUNTER — Telehealth: Payer: Self-pay | Admitting: Internal Medicine

## 2023-02-22 ENCOUNTER — Other Ambulatory Visit: Payer: Self-pay | Admitting: Family Medicine

## 2023-02-22 DIAGNOSIS — N451 Epididymitis: Secondary | ICD-10-CM

## 2023-02-22 NOTE — Telephone Encounter (Signed)
Please advise due to [place] being out. [place]

## 2023-03-08 ENCOUNTER — Other Ambulatory Visit: Payer: Self-pay | Admitting: Nurse Practitioner

## 2023-03-08 ENCOUNTER — Other Ambulatory Visit: Payer: Self-pay | Admitting: Family Medicine

## 2023-03-08 DIAGNOSIS — F411 Generalized anxiety disorder: Secondary | ICD-10-CM

## 2023-03-08 DIAGNOSIS — J302 Other seasonal allergic rhinitis: Secondary | ICD-10-CM

## 2023-03-08 NOTE — Telephone Encounter (Signed)
Encounter opened in error

## 2023-03-08 NOTE — Telephone Encounter (Signed)
Please advise kH 

## 2023-03-11 ENCOUNTER — Other Ambulatory Visit: Payer: Self-pay | Admitting: Family Medicine

## 2023-03-11 DIAGNOSIS — J9611 Chronic respiratory failure with hypoxia: Secondary | ICD-10-CM | POA: Diagnosis not present

## 2023-03-11 DIAGNOSIS — F411 Generalized anxiety disorder: Secondary | ICD-10-CM

## 2023-03-11 DIAGNOSIS — J449 Chronic obstructive pulmonary disease, unspecified: Secondary | ICD-10-CM | POA: Diagnosis not present

## 2023-03-11 MED ORDER — ALPRAZOLAM 0.25 MG PO TABS: ORAL_TABLET | ORAL | 0 refills | Status: DC

## 2023-03-11 NOTE — Progress Notes (Signed)
Meds ordered this encounter  Medications   ALPRAZolam (XANAX) 0.25 MG tablet    Sig: TAKE 1 TABLET AT BEDTIME    Dispense:  30 tablet    Refill:  0    Order Specific Question:   Supervising Provider    Answer:   Tresa Garter [id number]   Donia Pounds  APRN, MSN, FNP-C [facility] [address] [place], [place] [address] [phone]

## 2023-03-17 ENCOUNTER — Other Ambulatory Visit: Payer: Self-pay | Admitting: Internal Medicine

## 2023-03-17 DIAGNOSIS — J449 Chronic obstructive pulmonary disease, unspecified: Secondary | ICD-10-CM

## 2023-03-19 ENCOUNTER — Encounter: Payer: Self-pay | Admitting: *Deleted

## 2023-03-25 ENCOUNTER — Other Ambulatory Visit: Payer: Self-pay | Admitting: *Deleted

## 2023-03-25 DIAGNOSIS — Z87891 Personal history of nicotine dependence: Secondary | ICD-10-CM

## 2023-03-25 DIAGNOSIS — Z122 Encounter for screening for malignant neoplasm of respiratory organs: Secondary | ICD-10-CM

## 2023-03-26 ENCOUNTER — Other Ambulatory Visit: Payer: Self-pay | Admitting: Nurse Practitioner

## 2023-03-26 DIAGNOSIS — N451 Epididymitis: Secondary | ICD-10-CM

## 2023-03-27 NOTE — Telephone Encounter (Signed)
Please advise due to [place] being out. KH 

## 2023-04-02 ENCOUNTER — Ambulatory Visit (INDEPENDENT_AMBULATORY_CARE_PROVIDER_SITE_OTHER): Payer: Medicaid Other | Admitting: Physician Assistant

## 2023-04-02 ENCOUNTER — Encounter: Payer: Self-pay | Admitting: Physician Assistant

## 2023-04-02 DIAGNOSIS — Z87891 Personal history of nicotine dependence: Secondary | ICD-10-CM | POA: Diagnosis not present

## 2023-04-02 NOTE — Patient Instructions (Signed)
Thank you for participating in the [facility] Scre[facility]. It was our pleasure to meet you today. We will call you with the results of your scan within the next few days. Your scan will be assigned a Lung RADS category score by the physicians reading the scans.  This Lung RADS score determines follow up scanning.  See below for description of categories, and follow up screening recommendations. We will be in touch to schedule your follow up screening annually or based on recommendations of our providers. We will fax a copy of your scan results to your Primary Care Physician, or the physician who referred you to the program, to ensure they have the results. Please call the office if you have any questions or concerns regarding your scanning experience or results.  Our office number is [phone]. Please speak with Denise Phelps, RN. , or  Denise Buckner RN, They are  our Lung Cancer Screening RN.'s If They are unavailable when you call, Please leave a message on the voice mail. We will return your call at our earliest convenience.This voice mail is monitored several times a day.  Remember, if your scan is normal, we will scan you annually as long as you continue to meet the criteria for the program. (Age 50-80, Current smoker or smoker who has quit within the last 15 years). If you are a smoker, remember, quitting is the single most powerful action that you can take to decrease your risk of lung cancer and other pulmonary, breathing related problems. We know quitting is hard, and we are here to help.  Please let us know if there is anything we can do to help you meet your goal of quitting. If you are a former smoker, congratulations. We are proud of you! Remain smoke free! Remember you can refer friends or family members through the number above.  We will screen them to make sure they meet criteria for the program. Thank you for helping us take better care of you by  participating in Lung Screening.  You can receive free nicotine replacement therapy ( patches, gum or mints) by calling 1-800-QUIT NOW. Please call so we can get you on the path to becoming  a non-smoker. I know it is hard, but you can do this!  Lung RADS Categories:  Lung RADS 1: no nodules or definitely non-concerning nodules.  Recommendation is for a repeat annual scan in 12 months.  Lung RADS 2:  nodules that are non-concerning in appearance and behavior with a very low likelihood of becoming an active cancer. Recommendation is for a repeat annual scan in 12 months.  Lung RADS 3: nodules that are probably non-concerning , includes nodules with a low likelihood of becoming an active cancer.  Recommendation is for a 6-month repeat screening scan. Often noted after an upper respiratory illness. We will be in touch to make sure you have no questions, and to schedule your 6-month scan.  Lung RADS 4 A: nodules with concerning findings, recommendation is most often for a follow up scan in 3 months or additional testing based on our provider's assessment of the scan. We will be in touch to make sure you have no questions and to schedule the recommended 3 month follow up scan.  Lung RADS 4 B:  indicates findings that are concerning. We will be in touch with you to schedule additional diagnostic testing based on our provider's  assessment of the scan.  Other options for assistance in smoking cessation (   As covered by your insurance benefits)  Hypnosis for smoking cessation  [employer] [phone]  Acupuncture for smoking cessation  [facility] [phone]   

## 2023-04-02 NOTE — Progress Notes (Signed)
Virtual Visit via Telephone Note  I connected with Kenneth Casey on 04/02/23 at 10:30 AM EDT by telephone and verified that I am speaking with the correct person using two identifiers.  Location: Patient: home Provider: working virtually from home   I discussed the limitations, risks, security and privacy concerns of performing an evaluation and management service by telephone and the availability of in person appointments. I also discussed with the patient that there may be a patient responsible charge related to this service. The patient expressed understanding and agreed to proceed.       Shared Decision Making Visit Lung Cancer Screening Program [id number])   Eligibility: Age 67 y.o. Pack Years Smoking History Calculation 80 (# packs/per year x # years smoked) Recent History of coughing up blood  no Unexplained weight loss? no ( >Than 15 pounds within the last 6 months ) Prior History Lung / other cancer no (Diagnosis within the last 5 years already requiring surveillance chest CT Scans). Smoking Status Former Smoker Former Smokers: Years since quit: 12 years  Quit Date: 2012  Visit Components: Discussion included one or more decision making aids. yes Discussion included risk/benefits of screening. yes Discussion included potential follow up diagnostic testing for abnormal scans. yes Discussion included meaning and risk of over diagnosis. yes Discussion included meaning and risk of False Positives. yes Discussion included meaning of total radiation exposure. yes  Counseling Included: Importance of adherence to annual lung cancer LDCT screening. yes Impact of comorbidities on ability to participate in the program. yes Ability and willingness to under diagnostic treatment. yes  Smoking Cessation Counseling: Former Smokers:  Discussed the importance of maintaining cigarette abstinence. yes Diagnosis Code: Personal History of Nicotine Dependence. U04.540 Information  about tobacco cessation classes and interventions provided to patient. Yes Patient provided with "ticket" for LDCT Scan. N/a Written Order for Lung Cancer Screening with LDCT placed in Epic. Yes (CT Chest Lung Cancer Screening Low Dose W/O CM) [id number] Z12.2-Screening of respiratory organs Z87.891-Personal history of nicotine dependence    I spent 25 minutes of face to face time/virtual visit time  with the patient discussing the risks and benefits of lung cancer screening. We took the time to pause the power point at intervals to allow for questions to be asked and answered to ensure understanding. We discussed that he had taken the single most powerful action possible to decrease his risk of developing lung cancer when he quit smoking. I counseled him to remain smoke free, and to contact me if he ever had the desire to smoke again so that I can provide resources and tools to help support the effort to remain smoke free. We discussed the time and location of the scan, and that either  Abigail Miyamoto RN, Karlton Lemon, RN or I  or I will call / send a letter with the results within  24-72 hours of receiving them. He has the office contact information in the event he needs to speak with me,  he verbalized understanding of all of the above and had no further questions upon leaving the office.     I explained to the patient that there has been a high incidence of coronary artery disease noted on these exams. I explained that this is a non-gated exam therefore degree or severity cannot be determined. This patient is not on statin therapy. I have asked the patient to follow-up with their PCP regarding any incidental finding of coronary artery disease and management with diet or medication as  they feel is clinically indicated. The patient verbalized understanding of the above and had no further questions.     Otilio Carpen Trigger Frasier, PA-C

## 2023-04-04 ENCOUNTER — Ambulatory Visit (HOSPITAL_COMMUNITY)
Admission: RE | Admit: 2023-04-04 | Discharge: 2023-04-04 | Disposition: A | Discharge status: Home / Self Care | Payer: Medicaid Other | Source: Ambulatory Visit | Attending: Acute Care | Admitting: Acute Care

## 2023-04-04 DIAGNOSIS — Z87891 Personal history of nicotine dependence: Secondary | ICD-10-CM | POA: Diagnosis not present

## 2023-04-04 DIAGNOSIS — Z122 Encounter for screening for malignant neoplasm of respiratory organs: Secondary | ICD-10-CM | POA: Diagnosis present

## 2023-04-08 ENCOUNTER — Telehealth: Payer: Self-pay | Admitting: Acute Care

## 2023-04-08 NOTE — Telephone Encounter (Signed)
Call report  °

## 2023-04-08 NOTE — Telephone Encounter (Signed)
Received call report from [facility] with [facility] on patient's LCS CT done on 04/04/23. Kenneth Casey, please review the result/impression copied below:  IMPRESSION: 1. Lung-RADS 3, probably benign findings. Short-term follow-up in 6 months is recommended with repeat low-dose chest CT without contrast (please use the following order, "CT CHEST LCS NODULE FOLLOW-UP W/O CM"). Somewhat plaque-like nodular solid focus of consolidation in the posterior apical right upper lobe measuring 7.3 mm in volume derived mean diameter. 2. Three-vessel coronary atherosclerosis. 3. Aortic Atherosclerosis (ICD10-I70.0) and Emphysema (ICD10-J43.9)  Please advise, thank you.  **Also routing to lung nodule pool**

## 2023-04-10 DIAGNOSIS — J9611 Chronic respiratory failure with hypoxia: Secondary | ICD-10-CM | POA: Diagnosis not present

## 2023-04-10 DIAGNOSIS — J449 Chronic obstructive pulmonary disease, unspecified: Secondary | ICD-10-CM | POA: Diagnosis not present

## 2023-04-11 ENCOUNTER — Other Ambulatory Visit: Payer: Self-pay | Admitting: Family Medicine

## 2023-04-11 DIAGNOSIS — J302 Other seasonal allergic rhinitis: Secondary | ICD-10-CM

## 2023-04-11 DIAGNOSIS — F411 Generalized anxiety disorder: Secondary | ICD-10-CM

## 2023-04-11 NOTE — Telephone Encounter (Signed)
Please advise [facility] 

## 2023-04-23 ENCOUNTER — Other Ambulatory Visit: Payer: Self-pay | Admitting: Internal Medicine

## 2023-04-29 NOTE — Progress Notes (Unsigned)
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     History of Present Illness  12/21/2015 1st [facility] office visit/ Kenneth Casey   Chief Complaint  Patient presents with   HFU    Pt states that his breathing has improved some, but not baseline for him. He c/o chest congestion and has had some cough with clear sputum. He has been wheezing some. He is using albuterol inhaler 4-5 x per day and neb 2-4 x per day.   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  Plan B= Backup Only use your albuterol as a rescue medication to  Plan C = crisis Only use nebulizer if you try the ventolin first and it doesn't work  Try prilosec otc 20mg   Take 30-60 min before first meal of the day and Pepcid ac (famotidine) 20 mg one @  bedtime until cough is completely gone for at least a week without the need for cough suppression GERD diet          01/21/2023  f/u ov/[place] office/Kenneth Casey re: GOLD 2  maint on symbicort 160   Chief Complaint  Patient presents with   Follow-up    Breathing is about the same since last ov   Dyspnea:  no regular walking  Cough: worse x one week /non productive/ already  on protonix / pepcid  Sleeping: starts out in recliner 60 degrees/ bed is electric also but never < 30 degrees withougt cough/ sob  SABA use: not much  02: 2lpm hs only  Lung cancer screening: ordered 01/21/2023  Rec Add spiriva 2 puffs after only the AM Symbicort for 2 weeks  and fill the prescription if you note better exercise tolerance on it  Make sure you check your oxygen saturation at your highest level of activity(NOT after you stop)  to be sure it stays over 88%  Please schedule a follow up visit in 3 months but call sooner if needed    04/30/2023  f/u ov/[place] office/Kenneth Casey re: GOLD 2 copd  maint on ***  No chief complaint on file.   Dyspnea:  *** Cough: *** Sleeping: *** SABA use: *** 02: *** Covid status: *** Lung cancer screening: ***   No obvious day to day or daytime variability or assoc excess/ purulent sputum or mucus plugs or hemoptysis or cp or chest tightness, subjective wheeze or overt sinus or hb symptoms.   *** without nocturnal  or early am exacerbation  of respiratory  c/o's or need for noct saba. Also denies any obvious fluctuation of symptoms with weather or environmental changes or other aggravating or alleviating factors except as outlined above   No unusual exposure hx or h/o childhood pna/ asthma or knowledge of premature birth.  Current Allergies, Complete Past Medical History, Past Surgical History, Family History, and Social History were reviewed  in [employer] record.  ROS  The following are not active complaints unless bolded Hoarseness, sore throat, dysphagia, dental problems, itching, sneezing,  nasal congestion or discharge of excess mucus or purulent secretions, ear ache,   fever, chills, sweats, unintended wt loss or wt gain, classically pleuritic or exertional cp,  orthopnea pnd or arm/hand swelling  or leg swelling, presyncope, palpitations, abdominal pain, anorexia, nausea, vomiting, diarrhea  or change in bowel habits or change in bladder habits, change in stools or change in urine, dysuria, hematuria,  rash, arthralgias, visual complaints, headache, numbness, weakness or ataxia or problems with walking or coordination,  change in mood or  memory.        No outpatient medications have been  marked as taking for the 04/30/23 encounter (Appointment) with Kenneth Cowden, MD.                 Objective:   Physical Exam  wts   04/30/2023       ***  01/21/2023      305   08/14/2022        298  03/05/2022    299  07/14/2020      253  07/15/2019     232  04/16/2016          309 >  05/28/2016  305 > 09/03/2016   318 >  02/13/2017  323 >  08/13/2017  326 > 01/13/2018  328 > 04/14/2018  312 > 07/14/2018    290 > 10/20/2018  267 > 01/17/2016          311   12/20/15 308 lb (139.708 kg)  12/13/15 315 lb (142.883 kg)  12/08/15 312 lb (141.522 kg)     Vital signs reviewed  04/30/2023  - Note at rest 02 sats  ***% on ***   General appearance:    ***      Min bar      Assessment:

## 2023-04-30 ENCOUNTER — Encounter: Payer: Self-pay | Admitting: Internal Medicine

## 2023-04-30 ENCOUNTER — Ambulatory Visit (INDEPENDENT_AMBULATORY_CARE_PROVIDER_SITE_OTHER): Payer: Medicaid Other | Admitting: Internal Medicine

## 2023-04-30 VITALS — BP 142/80 | HR 80 | Ht 68.0 in | Wt 300.8 lb

## 2023-04-30 DIAGNOSIS — J449 Chronic obstructive pulmonary disease, unspecified: Secondary | ICD-10-CM

## 2023-04-30 DIAGNOSIS — J9611 Chronic respiratory failure with hypoxia: Secondary | ICD-10-CM | POA: Diagnosis not present

## 2023-04-30 DIAGNOSIS — J9612 Chronic respiratory failure with hypercapnia: Secondary | ICD-10-CM | POA: Diagnosis not present

## 2023-04-30 MED ORDER — PREDNISONE 10 MG PO TABS: ORAL_TABLET | ORAL | 0 refills | Status: DC

## 2023-04-30 NOTE — Patient Instructions (Addendum)
Prednisone 10 mg take  4 each am x 2 days,   2 each am x 2 days,  1 each am x 2 days and stop   You do have calcium on your heart arteries  which puts you at a higher risk of a heart attack and you may need to be placed on cholesterol and see a heart doctor in the feature.  In the meantime the best option is more exercise and wt loss   Please schedule a follow up visit in 4 months but call sooner if needed

## 2023-04-30 NOTE — Assessment & Plan Note (Signed)
PFTs 07/14/2018  erv =  16% at wt 290   Body mass index is 45.74 kg/m.  -  trending hfa/smi/ 02  Lab Results  Component Value Date   TSH 2.564 12/08/2015    Contributing to doe and risk of GERD >>>   reviewed the need and the process to achieve and maintain neg calorie balance > defer f/u primary care including intermittently monitoring thyroid status         Each maintenance medication was reviewed in detail including emphasizing most importantly the difference between maintenance and prns and under what circumstances the prns are to be triggered using an action plan format where appropriate.  Total time for H and P, chart review, counseling, reviewing hfa/smi/02 device(s) and generating customized AVS unique to this office visit / same day charting > 30 min

## 2023-04-30 NOTE — Assessment & Plan Note (Signed)
Quit smoking 2012  12/20/2015  extensive coaching HFA effectiveness =    75% > continue symbicort 160 2bid - Spirometry 01/17/2016  FEV1 1.62 (44%)  Ratio 61 - 05/28/2016   try bevespi 2bid > improved 09/03/2016 but decided liked symbicort better = 80 bid> increased to 160 2bid  08/13/17  - 04/14/2018  After extensive coaching inhaler device  effectiveness =   90% p 4 tries  - 07/14/2018  After extensive coaching inhaler device  effectiveness =    90%  - PFT's  07/14/2018  FEV1 1.80 (57 % ) ratio 60  p 9 % improvement from saba p nothing prior to study with DLCO  94 % corrects to 121 % for alv volume   And erv 16% @ 290 lbs - 01/21/2023  After extensive coaching inhaler device,  effectiveness =    90% with SMI > add spiriva 2.5 2 each am    Group D (now reclassified as E) in terms of symptom/risk and laba/lama/ICS  therefore appropriate rx at this point >>>  symb 160/spiriva 2.5   For flare with pollen season:  Prednisone 10 mg take  4 each am x 2 days,   2 each am x 2 days,  1 each am x 2 days and stop

## 2023-04-30 NOTE — Assessment & Plan Note (Signed)
On 02 3lpm since d/c 11/18/2015 - HCO3  34  12/14/15  - 01/17/2016 sats mid 90's RA at rest so rec 2lpm sleeping / exerting but none needed at rest  - 04/16/2016  Walked 4lpm pulsed x 3 laps @ 185 ft each stopped due to end of study, mild sob, no desat  - 08/13/2017  Walked RA x 3 laps @ 185 ft each stopped due to  End of study, nl to mod fast pace, no desat , min sob   - 01/13/2018  Walked RA x 3 laps @ 185 ft each stopped due to  End of study, nl pace, no sob or desat   - HCO3  01/13/2018  =  30 c/w only mild hypercarbia   - ONO RA  04/01/18 desat x 5 h and 20 min so needs 2lpm   - 04/14/2018  Walked RA x 3 laps @ 185 ft each stopped due to  End of study, sats 88% corrected on 2lpm  - 04/22/18  ono on 3lpm = 1h 44 min  So rec 4lpm and repeat on 4lpm > done 05/01/18  only 12 min sat @ < 89% so no change rx  - 10/20/2018 pt titrated down to 2lpm with wt loss and "sleeps great"   Again advise: Make sure you check your oxygen saturation  AT  your highest level of activity (not after you stop)   to be sure it stays over 90% and adjust  02 flow upward to maintain this level if needed but remember to turn it back to previous settings when you stop (to conserve your supply).

## 2023-05-01 ENCOUNTER — Other Ambulatory Visit: Payer: Self-pay | Admitting: Acute Care

## 2023-05-01 DIAGNOSIS — Z87891 Personal history of nicotine dependence: Secondary | ICD-10-CM

## 2023-05-01 DIAGNOSIS — R911 Solitary pulmonary nodule: Secondary | ICD-10-CM

## 2023-05-11 ENCOUNTER — Other Ambulatory Visit: Payer: Self-pay | Admitting: Family Medicine

## 2023-05-11 DIAGNOSIS — J302 Other seasonal allergic rhinitis: Secondary | ICD-10-CM

## 2023-05-11 DIAGNOSIS — N451 Epididymitis: Secondary | ICD-10-CM

## 2023-05-11 DIAGNOSIS — F411 Generalized anxiety disorder: Secondary | ICD-10-CM

## 2023-05-11 DIAGNOSIS — J449 Chronic obstructive pulmonary disease, unspecified: Secondary | ICD-10-CM | POA: Diagnosis not present

## 2023-05-11 DIAGNOSIS — J9611 Chronic respiratory failure with hypoxia: Secondary | ICD-10-CM | POA: Diagnosis not present

## 2023-05-13 NOTE — Telephone Encounter (Signed)
Please advise [facility] 

## 2023-05-14 ENCOUNTER — Other Ambulatory Visit: Payer: Self-pay

## 2023-05-14 DIAGNOSIS — N451 Epididymitis: Secondary | ICD-10-CM

## 2023-05-14 DIAGNOSIS — J302 Other seasonal allergic rhinitis: Secondary | ICD-10-CM

## 2023-05-14 DIAGNOSIS — F411 Generalized anxiety disorder: Secondary | ICD-10-CM

## 2023-05-14 MED ORDER — FLUTICASONE PROPIONATE 50 MCG/ACT NA SUSP
NASAL | 0 refills | Status: DC
Start: 2023-05-14 — End: 2023-07-22

## 2023-05-14 NOTE — Progress Notes (Signed)
This encounter was created in error - please disregard.

## 2023-05-14 NOTE — Telephone Encounter (Signed)
Please advised kh

## 2023-05-14 NOTE — Telephone Encounter (Signed)
Please advise. kh 

## 2023-06-10 ENCOUNTER — Other Ambulatory Visit: Payer: Self-pay | Admitting: Family Medicine

## 2023-06-10 ENCOUNTER — Other Ambulatory Visit: Payer: Self-pay | Admitting: Internal Medicine

## 2023-06-10 DIAGNOSIS — N451 Epididymitis: Secondary | ICD-10-CM

## 2023-06-10 DIAGNOSIS — F411 Generalized anxiety disorder: Secondary | ICD-10-CM

## 2023-06-10 DIAGNOSIS — J9611 Chronic respiratory failure with hypoxia: Secondary | ICD-10-CM | POA: Diagnosis not present

## 2023-06-10 DIAGNOSIS — J449 Chronic obstructive pulmonary disease, unspecified: Secondary | ICD-10-CM

## 2023-06-10 NOTE — Telephone Encounter (Signed)
Please advise [facility] 

## 2023-06-14 ENCOUNTER — Other Ambulatory Visit: Payer: Self-pay

## 2023-06-17 ENCOUNTER — Other Ambulatory Visit: Payer: Self-pay

## 2023-06-22 ENCOUNTER — Other Ambulatory Visit: Payer: Self-pay | Admitting: Family Medicine

## 2023-06-22 DIAGNOSIS — R7303 Prediabetes: Secondary | ICD-10-CM

## 2023-06-25 ENCOUNTER — Other Ambulatory Visit: Payer: Self-pay | Admitting: Family Medicine

## 2023-06-25 DIAGNOSIS — R7303 Prediabetes: Secondary | ICD-10-CM

## 2023-07-01 ENCOUNTER — Other Ambulatory Visit: Payer: Self-pay | Admitting: Internal Medicine

## 2023-07-01 DIAGNOSIS — J449 Chronic obstructive pulmonary disease, unspecified: Secondary | ICD-10-CM

## 2023-07-09 ENCOUNTER — Ambulatory Visit: Payer: Self-pay | Admitting: Family Medicine

## 2023-07-11 ENCOUNTER — Ambulatory Visit: Payer: Medicaid Other | Admitting: Family Medicine

## 2023-07-11 VITALS — BP 138/68 | HR 80 | Temp 97.2°F | Wt 300.0 lb

## 2023-07-11 DIAGNOSIS — E785 Hyperlipidemia, unspecified: Secondary | ICD-10-CM | POA: Diagnosis not present

## 2023-07-11 DIAGNOSIS — F411 Generalized anxiety disorder: Secondary | ICD-10-CM | POA: Diagnosis not present

## 2023-07-11 DIAGNOSIS — I10 Essential (primary) hypertension: Secondary | ICD-10-CM | POA: Diagnosis not present

## 2023-07-11 DIAGNOSIS — J069 Acute upper respiratory infection, unspecified: Secondary | ICD-10-CM

## 2023-07-11 DIAGNOSIS — R7303 Prediabetes: Secondary | ICD-10-CM | POA: Diagnosis not present

## 2023-07-11 DIAGNOSIS — J9611 Chronic respiratory failure with hypoxia: Secondary | ICD-10-CM | POA: Diagnosis not present

## 2023-07-11 DIAGNOSIS — R058 Other specified cough: Secondary | ICD-10-CM | POA: Diagnosis not present

## 2023-07-11 DIAGNOSIS — J449 Chronic obstructive pulmonary disease, unspecified: Secondary | ICD-10-CM | POA: Diagnosis not present

## 2023-07-11 DIAGNOSIS — M7750 Other enthesopathy of unspecified foot: Secondary | ICD-10-CM

## 2023-07-11 LAB — POCT GLYCOSYLATED HEMOGLOBIN (HGB A1C): Hemoglobin A1C: 5.7 % — AB (ref 4.0–5.6)

## 2023-07-11 MED ORDER — AZITHROMYCIN 250 MG PO TABS
ORAL_TABLET | ORAL | 5 refills | Status: AC
Start: 2023-07-11 — End: 2023-07-16

## 2023-07-11 MED ORDER — OZEMPIC (1 MG/DOSE) 4 MG/3ML ~~LOC~~ SOPN
1.0000 mg | PEN_INJECTOR | SUBCUTANEOUS | 0 refills | Status: DC
Start: 2023-07-11 — End: 2024-04-07

## 2023-07-11 MED ORDER — ALPRAZOLAM 0.25 MG PO TABS
ORAL_TABLET | ORAL | 0 refills | Status: DC
Start: 2023-07-11 — End: 2023-08-13

## 2023-07-11 MED ORDER — HYDROCOD POLI-CHLORPHE POLI ER 10-8 MG/5ML PO SUER
5.0000 mL | Freq: Two times a day (BID) | ORAL | 0 refills | Status: DC | PRN
Start: 2023-07-11 — End: 2024-04-07

## 2023-07-11 NOTE — Patient Instructions (Signed)

## 2023-07-11 NOTE — Progress Notes (Signed)
Established Patient Office Visit  Subjective   Patient ID: Kenneth Casey, male    DOB: 28-Aug-1956  Age: 67 y.o. MRN: [id number]  Chief Complaint  Patient presents with   Follow-up    Over all health pre diabetes     Kenneth Casey is a very pleasant 67 year old male that presents accompanied by his wife for a follow-up of chronic conditions.  Patient has medical history significant for obesity, COPD on home oxygen, and chronic respiratory failure with hypoxia.  Patient states that he has been doing well and is without complaint.  Patient was previously prescribed Ozempic for prediabetes.  Patient has not experienced any weight loss on this medication.  His hemoglobin A1c has been well-controlled.  Patient denies any polyuria, polydipsia, or polyphagia.  He has not been following a carbohydrate modified diet or exercising. Patient has a history of COPD.  He is followed by pulmonology every 3 months.  He mostly wears his oxygen at bedtime.  He has not had a COPD exacerbation over the past year.  Patient is complaining of upper respiratory symptoms that include productive cough, nasal congestion, and wheezing over the past week.  Symptoms have been unrelieved by over-the-counter medications.  Patient states that he has been utilizing albuterol inhaler without very much relief.    Patient Active Problem List   Diagnosis Date Noted   Plantar fasciitis 09/05/2022   Former smoker 08/14/2022   Chronic respiratory failure with hypoxia and hypercapnia (HCC) 12/21/2015   COPD GOLD 2 12/14/2015   Chronic diastolic CHF (congestive heart failure) (HCC) 12/13/2015   Diastolic dysfunction 12/08/2015   Morbid obesity due to excess calories (HCC) 12/08/2015   Leukocytosis 12/08/2015   On home oxygen therapy 12/08/2015   Bilateral edema of lower extremity 12/08/2015   Sinus tachycardia 12/08/2015   Insomnia 12/08/2015   Past Medical History:  Diagnosis Date   COPD (chronic obstructive pulmonary  disease) (HCC)    Obese    Pneumonia    Past Surgical History:  Procedure Laterality Date   CHOLECYSTECTOMY     Social History   Tobacco Use   Smoking status: Former    Current packs/day: 0.00    Average packs/day: 2.0 packs/day for 39.0 years (78.0 ttl pk-yrs)    Types: Cigarettes    Start date: 01/09/1972    Quit date: 01/08/2011    Years since quitting: 12.5   Smokeless tobacco: Never  Vaping Use   Vaping status: Former  Substance Use Topics   Alcohol use: No    Alcohol/week: 0.0 standard drinks of alcohol   Drug use: No   Social History   Socioeconomic History   Marital status: Married    Spouse name: Not on file   Number of children: Not on file   Years of education: Not on file   Highest education level: Not on file  Occupational History   Not on file  Tobacco Use   Smoking status: Former    Current packs/day: 0.00    Average packs/day: 2.0 packs/day for 39.0 years (78.0 ttl pk-yrs)    Types: Cigarettes    Start date: 01/09/1972    Quit date: 01/08/2011    Years since quitting: 12.5   Smokeless tobacco: Never  Vaping Use   Vaping status: Former  Substance and Sexual Activity   Alcohol use: No    Alcohol/week: 0.0 standard drinks of alcohol   Drug use: No   Sexual activity: Yes    Birth control/protection: None  Other Topics  Concern   Not on file  Social History Narrative   Not on file   Social Determinants of Health   Financial Resource Strain: Not on file  Food Insecurity: Not on file  Transportation Needs: Not on file  Physical Activity: Not on file  Stress: Not on file  Social Connections: Not on file  Intimate Partner Violence: Not on file   Family Status  Relation Name Status   Mother  (Not Specified)   Father  (Not Specified)   Mat Aunt  (Not Specified)   Oneal Grout  (Not Specified)  No partnership data on file   Family History  Problem Relation Age of Onset   Cancer Mother        breast cancer, lung cancer   Diabetes Father     Cancer Maternal Aunt    Heart disease Paternal Uncle    Allergies  Allergen Reactions   Adhesive [Tape] Rash      Review of Systems  Constitutional: Negative.   HENT: Negative.    Respiratory: Negative.    Genitourinary: Negative.   Musculoskeletal: Negative.   Skin: Negative.   Psychiatric/Behavioral: Negative.        Objective:     BP 138/68   Pulse 80   Temp (!) 97.2 F (36.2 C)   Wt 300 lb (136.1 kg)   SpO2 95%   BMI 45.61 kg/m  BP Readings from Last 3 Encounters:  07/11/23 138/68  04/30/23 (!) 142/80  01/21/23 (!) 140/88   Wt Readings from Last 3 Encounters:  07/11/23 300 lb (136.1 kg)  04/30/23 (!) 300 lb 12.8 oz (136.4 kg)  01/21/23 (!) 305 lb 3.2 oz (138.4 kg)      Physical Exam Constitutional:      Appearance: Normal appearance.  Eyes:     Pupils: Pupils are equal, round, and reactive to light.  Cardiovascular:     Rate and Rhythm: Normal rate and regular rhythm.  Pulmonary:     Effort: Pulmonary effort is normal.  Abdominal:     General: Bowel sounds are normal.  Skin:    General: Skin is warm.  Neurological:     General: No focal deficit present.     Mental Status: He is alert. Mental status is at baseline.  Psychiatric:        Mood and Affect: Mood normal.        Behavior: Behavior normal.        Thought Content: Thought content normal.        Judgment: Judgment normal.      Results for orders placed or performed in visit on 07/11/23  CMP and Liver  Result Value Ref Range   Glucose 95 70 - 99 mg/dL   BUN 18 8 - 27 mg/dL   Creatinine, Ser 1.61 0.76 - 1.27 mg/dL   eGFR 88 >09 UE/AVW/0.98   Sodium 140 134 - 144 mmol/L   Potassium 4.5 3.5 - 5.2 mmol/L   Chloride 103 96 - 106 mmol/L   CO2 26 20 - 29 mmol/L   Calcium 9.0 8.6 - 10.2 mg/dL   Total Protein 6.4 6.0 - 8.5 g/dL   Albumin 4.1 3.9 - 4.9 g/dL   Bilirubin Total 0.3 0.0 - 1.2 mg/dL   Bilirubin, Direct 1.19 0.00 - 0.40 mg/dL   Alkaline Phosphatase 72 44 - 121 IU/L   AST  17 0 - 40 IU/L   ALT 14 0 - 44 IU/L  Lipid Panel  Result Value Ref Range  Cholesterol, Total 158 100 - 199 mg/dL   Triglycerides 84 0 - 149 mg/dL   HDL 30 (L) >89 mg/dL   VLDL Cholesterol Cal 16 5 - 40 mg/dL   LDL Chol Calc (NIH) 381 (H) 0 - 99 mg/dL   Chol/HDL Ratio 5.3 (H) 0.0 - 5.0 ratio  POCT glycosylated hemoglobin (Hb A1C)  Result Value Ref Range   Hemoglobin A1C 5.7 (A) 4.0 - 5.6 %   HbA1c POC (<> result, manual entry)     HbA1c, POC (prediabetic range)     HbA1c, POC (controlled diabetic range)      Last CBC Lab Results  Component Value Date   WBC 6.7 06/21/2021   HGB 15.6 06/21/2021   HCT 48.3 06/21/2021   MCV 88 06/21/2021   MCH 28.3 06/21/2021   RDW 13.1 06/21/2021   PLT 262 06/21/2021   Last metabolic panel Lab Results  Component Value Date   GLUCOSE 95 07/11/2023   NA 140 07/11/2023   K 4.5 07/11/2023   CL 103 07/11/2023   CO2 26 07/11/2023   BUN 18 07/11/2023   CREATININE 0.96 07/11/2023   EGFR 88 07/11/2023   CALCIUM 9.0 07/11/2023   PHOS 5.0 (H) 11/18/2015   PROT 6.4 07/11/2023   ALBUMIN 4.1 07/11/2023   LABGLOB 2.5 01/08/2023   AGRATIO 1.8 01/08/2023   BILITOT 0.3 07/11/2023   ALKPHOS 72 07/11/2023   AST 17 07/11/2023   ALT 14 07/11/2023   ANIONGAP 7 12/14/2015   Last lipids Lab Results  Component Value Date   CHOL 158 07/11/2023   HDL 30 (L) 07/11/2023   LDLCALC 112 (H) 07/11/2023   TRIG 84 07/11/2023   CHOLHDL 5.3 (H) 07/11/2023   Last hemoglobin A1c Lab Results  Component Value Date   HGBA1C 5.7 (A) 07/11/2023   Last thyroid functions Lab Results  Component Value Date   TSH 2.564 12/08/2015   Last vitamin D No results found for: "25OHVITD2", "25OHVITD3", "VD25OH" Last vitamin B12 and Folate No results found for: "VITAMINB12", "FOLATE"    The 10-year ASCVD risk score (Arnett DK, et al., 2019) is: 27.3%    Assessment & Plan:   Problem List Items Addressed This Visit   None Visit Diagnoses     Prediabetes    -   Primary   Relevant Medications   Semaglutide, 1 MG/DOSE, (OZEMPIC, 1 MG/DOSE,) 4 MG/3ML SOPN   Other Relevant Orders   POCT glycosylated hemoglobin (Hb A1C) (Completed)   Ambulatory referral to Ophthalmology   CMP and Liver (Completed)   Upper respiratory tract infection, unspecified type       Relevant Medications   chlorpheniramine-HYDROcodone (TUSSIONEX) 10-8 MG/5ML   Essential hypertension       Relevant Orders   Ambulatory referral to Ophthalmology   CMP and Liver (Completed)   Hyperlipidemia LDL goal <100       Generalized anxiety disorder       Relevant Medications   ALPRAZolam (XANAX) 0.25 MG tablet   Generalized anxiety disorder       Refill on Xanax   Relevant Medications   ALPRAZolam (XANAX) 0.25 MG tablet   Productive cough       Relevant Medications   chlorpheniramine-HYDROcodone (TUSSIONEX) 10-8 MG/5ML   Hyperlipidemia, unspecified hyperlipidemia type       Relevant Orders   Lipid Panel (Completed)     1. Prediabetes  - POCT glycosylated hemoglobin (Hb A1C) - Semaglutide, 1 MG/DOSE, (OZEMPIC, 1 MG/DOSE,) 4 MG/3ML SOPN; Inject 1 mg into  the skin once a week.  Dispense: 3 mL; Refill: 0 - Ambulatory referral to Ophthalmology - CMP and Liver  2. Upper respiratory tract infection, unspecified type  - azithromycin (ZITHROMAX) 250 MG tablet; Take 2 tablets on day 1, then 1 tablet daily on days 2 through 5  Dispense: 6 tablet; Refill: 5 - chlorpheniramine-HYDROcodone (TUSSIONEX) 10-8 MG/5ML; Take 5 mLs by mouth every 12 (twelve) hours as needed for cough.  Dispense: 70 mL; Refill: 0  3. Essential hypertension BP 138/68   Pulse 80   Temp (!) 97.2 F (36.2 C)   Wt 300 lb (136.1 kg)   SpO2 95%   BMI 45.61 kg/m   - Ambulatory referral to Ophthalmology - CMP and Liver  4. Hyperlipidemia LDL goal <100 The patient is asked to make an attempt to improve diet and exercise patterns to aid in medical management of this problem.   5. Generalized anxiety  disorder  - ALPRAZolam (XANAX) 0.25 MG tablet; TAKE 1 TABLET BY MOUTH AT BEDTIME  Dispense: 30 tablet; Refill: 0  6. Generalized anxiety disorder  - ALPRAZolam (XANAX) 0.25 MG tablet; TAKE 1 TABLET BY MOUTH AT BEDTIME  Dispense: 30 tablet; Refill: 0  7. Productive cough  - chlorpheniramine-HYDROcodone (TUSSIONEX) 10-8 MG/5ML; Take 5 mLs by mouth every 12 (twelve) hours as needed for cough.  Dispense: 70 mL; Refill: 0  8. Hyperlipidemia, unspecified hyperlipidemia type The 10-year ASCVD risk score (Arnett DK, et al., 2019) is: 27.3%   Values used to calculate the score:     Age: 25 years     Sex: Male     Is Non-Hispanic African American: No     Diabetic: Yes     Tobacco smoker: No     Systolic Blood Pressure: 138 mmHg     Is BP treated: No     HDL Cholesterol: 30 mg/dL     Total Cholesterol: 158 mg/dL  - Lipid Panel   Return in 6 months (on 01/11/2024) for copd, pre-diabetes, hypertension.     Nolon Nations  APRN, MSN, FNP-C Patient Care [facility] [address] [place], [place] [address] [phone]

## 2023-07-12 ENCOUNTER — Other Ambulatory Visit: Payer: Self-pay | Admitting: Family Medicine

## 2023-07-12 DIAGNOSIS — E785 Hyperlipidemia, unspecified: Secondary | ICD-10-CM

## 2023-07-12 MED ORDER — ATORVASTATIN CALCIUM 20 MG PO TABS
20.0000 mg | ORAL_TABLET | Freq: Every day | ORAL | 3 refills | Status: DC
Start: 2023-07-12 — End: 2024-06-25

## 2023-07-12 NOTE — Progress Notes (Signed)
Meds ordered this encounter  ?Medications  ? atorvastatin (LIPITOR) 20 MG tablet  ?  Sig: Take 1 tablet (20 mg total) by mouth daily.  ?  Dispense:  90 tablet  ?  Refill:  3  ?  Order Specific Question:   Supervising Provider  ?  AnswerTresa Garter [[id number]]  ?  ? ?Donia Pounds  APRN, MSN, FNP-C ?Patient [place] ?[facility] ?[address]  ?[place], [place] [address] ?[phone] ? ?

## 2023-07-22 ENCOUNTER — Other Ambulatory Visit: Payer: Self-pay

## 2023-07-22 ENCOUNTER — Other Ambulatory Visit: Payer: Self-pay | Admitting: Family Medicine

## 2023-07-22 DIAGNOSIS — J302 Other seasonal allergic rhinitis: Secondary | ICD-10-CM

## 2023-07-23 ENCOUNTER — Telehealth: Payer: Self-pay | Admitting: Family Medicine

## 2023-07-23 NOTE — Telephone Encounter (Signed)
Pt called stating his prescription got messed up and he needs [place] or her nurse to call him about it please

## 2023-07-25 ENCOUNTER — Other Ambulatory Visit: Payer: Self-pay

## 2023-07-25 NOTE — Telephone Encounter (Signed)
Pt was on ozempic and for some reason it is no longer covered. Please advise if a PA will need to be done or if patient assistance can be applied for. Thank you for being right here with me .   Renelda Loma RMA

## 2023-07-26 ENCOUNTER — Other Ambulatory Visit: Payer: Self-pay

## 2023-08-11 DIAGNOSIS — J449 Chronic obstructive pulmonary disease, unspecified: Secondary | ICD-10-CM | POA: Diagnosis not present

## 2023-08-11 DIAGNOSIS — J9611 Chronic respiratory failure with hypoxia: Secondary | ICD-10-CM | POA: Diagnosis not present

## 2023-08-12 ENCOUNTER — Other Ambulatory Visit: Payer: Self-pay | Admitting: Family Medicine

## 2023-08-12 DIAGNOSIS — N451 Epididymitis: Secondary | ICD-10-CM

## 2023-08-12 DIAGNOSIS — F411 Generalized anxiety disorder: Secondary | ICD-10-CM

## 2023-08-13 ENCOUNTER — Telehealth: Payer: Self-pay

## 2023-08-13 NOTE — Telephone Encounter (Signed)
Pt never was given his tussionex  due to the pharmacy not having it. Please advise if you want him to have this on hand . If so please send to [facility] or [facility] in [place]. Thanks [employer]

## 2023-08-15 ENCOUNTER — Other Ambulatory Visit: Payer: Self-pay

## 2023-08-15 DIAGNOSIS — R21 Rash and other nonspecific skin eruption: Secondary | ICD-10-CM

## 2023-08-15 MED ORDER — TRIAMCINOLONE ACETONIDE 0.5 % EX OINT
TOPICAL_OINTMENT | Freq: Two times a day (BID) | CUTANEOUS | 1 refills | Status: DC
Start: 2023-08-15 — End: 2024-10-21

## 2023-08-15 NOTE — Telephone Encounter (Signed)
Called pt and lvm for call back [facility]

## 2023-08-15 NOTE — Telephone Encounter (Signed)
Please advise Kh 

## 2023-08-26 NOTE — Progress Notes (Unsigned)
Subjective:    Patient ID: Kenneth Casey, male   DOB: 06/17/1956    MRN: [id number]  Brief patient profile:  52  yowm retired Management consultant Quit smoking around 2012 at wt around 210 and did fine until winter of 2016 cough/sob self rx with albuterol helped some hfa and neb then added BREO  And proved to have GOLD III 01/2016.     History of Present Illness  12/21/2015 1st [facility] office visit/ Kenneth Casey  wt 308  Chief Complaint  Patient presents with   HFU    Pt states that his breathing has improved some, but not baseline for him. He c/o chest congestion and has had some cough with clear sputum. He has been wheezing some. He is using albuterol inhaler 4-5 x per day and neb 2-4 x per day.   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  Plan B= Backup Only use your albuterol as a rescue medication to  Plan C = crisis Only use nebulizer if you try the ventolin first and it doesn't work  Try prilosec otc 20mg   Take 30-60 min before first meal of the day and Pepcid ac (famotidine) 20 mg one @  bedtime until cough is completely gone for at least a week without the need for cough suppression GERD diet          01/21/2023  f/u ov/[place] office/Kenneth Casey re: GOLD 2  maint on symbicort 160   Chief Complaint  Patient presents with   Follow-up    Breathing is about the same since last ov   Dyspnea:  no regular walking  Cough: worse x one week /non productive/ already  on protonix / pepcid  Sleeping: starts out in recliner 60 degrees/ bed is electric also but never < 30 degrees withougt cough/ sob  SABA use: not much  02: 2lpm hs only  Lung cancer screening: ordered 01/21/2023  Rec Add spiriva 2 puffs after only the AM Symbicort for  2 weeks and fill the prescription if you note better exercise tolerance on it  Make sure you check your oxygen saturation at your highest level of activity(NOT after you stop)  to be sure it stays over 88%  Please schedule a follow up visit in 3 months but call sooner if needed    04/30/2023 3 m f/u ov/[place] office/Kenneth Casey re: 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 He is also noticing some wheezing.   Dyspnea:  stops due to legs / does weed eating but always rides mower Cough: more than usual last few days / on mucinex dm /slt bloody  Sleeping: no change  SABA use: avg  hfa  sev x days  02: 2lpm hs /  has back pack not using for portable nor checking sats with exertion  Rec Prednisone 10 mg take  4 each am x 2 days,   2 each am x 2 days,  1 each am x 2 days and stop  You do have calcium on your heart arteries  which puts  you at a higher risk of a heart attack and you may need to be placed on cholesterol and see a heart doctor in the feature. In the meantime the best option is more exercise and wt loss     08/28/2023  4 m f/u ov/[place] office/Kenneth Casey re: GOLD 2 copd/ 02 dep hs maint on symbicort/spiriva   Chief Complaint  Patient presents with   Follow-up   Dyspnea:  very sedentary / slowed by paintfull numbness in L lateral thigh rx gabapentin Cough: last few days worsening again, does best on prednisone but adding to wt gain  Sleeping: mostly in either recliner or bed at 30 degrees s   resp cc  SABA use: 2-3 x per day / neb rarely  02: 2lpm hs and prn but rarely uses daytimes/ not monitoring either.   Lung cancer screening: in program   No obvious day to day or daytime variability or assoc excess/ purulent sputum or mucus plugs or hemoptysis or cp or chest tightness, subjective wheeze or overt sinus or hb symptoms.    Also denies any obvious fluctuation of symptoms with weather  or environmental changes or other aggravating or alleviating factors except as outlined above   No unusual exposure hx or h/o childhood pna/ asthma or knowledge of premature birth.  Current Allergies, Complete Past Medical History, Past Surgical History, Family History, and Social History were reviewed in [employer] record.  ROS  The following are not active complaints unless bolded Hoarseness, sore throat, dysphagia, dental problems, itching, sneezing,  nasal congestion or discharge of excess mucus or purulent secretions, ear ache,   fever, chills, sweats, unintended wt loss or wt gain, classically pleuritic or exertional cp,  orthopnea pnd or arm/hand swelling  or leg swelling, presyncope, palpitations, abdominal pain, anorexia, nausea, vomiting, diarrhea  or change in bowel habits or change in bladder habits, change in stools or change in urine, dysuria, hematuria,  rash, arthralgias, visual complaints, headache, numbness, weakness or ataxia or problems with walking or coordination,  change in mood or  memory.        Current Meds  Medication Sig   Accu-Chek Softclix Lancets lancets Use as instructed   acetaminophen (TYLENOL) 500 MG tablet Take 1,000 mg by mouth every 6 (six) hours as needed for moderate pain.   albuterol (PROVENTIL) (2.5 MG/3ML) 0.083% nebulizer solution USE 1 VIAL IN NEBULIZER EVERY 4 HOURS AS NEEDED FOR WHEEZING FOR SHORTNESS OF BREATH   albuterol (VENTOLIN HFA) 108 (90 Base) MCG/ACT inhaler Inhale 1-2 puffs into the lungs every 6 (six) hours as needed for wheezing or shortness of breath.   ALPRAZolam (XANAX) 0.25 MG tablet TAKE 1 TABLET BY MOUTH AT BEDTIME   atorvastatin (LIPITOR) 20 MG tablet Take 1 tablet (20 mg total) by mouth daily.   Blood Glucose Monitoring Suppl (ACCU-CHEK GUIDE ME) w/Device KIT 1 each by Does not apply route 2 (two) times daily.   busPIRone (BUSPAR) 10 MG tablet Take 1 tablet (10 mg total) by mouth 2 (two) times daily.    dextromethorphan-guaiFENesin (MUCINEX DM) 30-600 MG 12hr tablet Take 1 tablet by mouth 2 (two) times daily.   diclofenac (VOLTAREN) 75 MG EC tablet Take 1 tablet (75 mg total) by mouth 2 (two) times daily.   famotidine (PEPCID) 20 MG tablet TAKE 1 TABLET BY MOUTH AFTER SUPPER   fluticasone (FLONASE) 50 MCG/ACT nasal spray Use 2 spray(s) in each nostril once daily   glucose blood (ACCU-CHEK GUIDE) test strip Use as  instructed   naproxen (NAPROSYN) 500 MG tablet TAKE 1 TABLET BY MOUTH TWICE DAILY WITH A MEAL   OXYGEN 2 with sleep and exertion   predniSONE (DELTASONE) 10 MG tablet Take  4 each am x 2 days,   2 each am x 2 days,  1 each am x 2 days and stop   SYMBICORT 160-4.5 MCG/ACT inhaler INHALE 2 PUFFS BY MOUTH FIRST THING IN THE MORNING THEN ANOTHER 2 PUFFS ABOUT 12 HOURS LATER   tamsulosin (FLOMAX) 0.4 MG CAPS capsule Take 0.4 mg by mouth daily.   Tiotropium Bromide Monohydrate (SPIRIVA RESPIMAT) 2.5 MCG/ACT AERS 2 pffs each am   triamcinolone ointment (KENALOG) 0.5 % Apply topically 2 (two) times daily.                       Objective:   Physical Exam  wts   08/28/2023      302  04/30/2023      300  01/21/2023      305   08/14/2022        298  03/05/2022    299  07/14/2020      253  07/15/2019     232  04/16/2016          309 >  05/28/2016  305 > 09/03/2016   318 >  02/13/2017  323 >  08/13/2017  326 > 01/13/2018  328 > 04/14/2018  312 > 07/14/2018    290 > 10/20/2018  267 > 01/17/2016          311   12/20/15 308 lb (139.708 kg)  12/13/15 315 lb (142.883 kg)  12/08/15 312 lb (141.522 kg)     Vital signs reviewed  08/28/2023  - Note at rest 02 sats  94% on RA   General appearance:    obese amb wm nad   HEENT : Oropharynx  clear      NECK :  without  apparent JVD/ palpable Nodes/TM    LUNGS: no acc muscle use,  Min barrel  contour chest wall with bilateral  slightly decreased bs s audible wheeze and  without cough on insp or exp maneuvers and min  Hyperresonant  to  percussion bilaterally     CV:  RRR  no s3 or murmur or increase in P2, and no edema   ABD:  soft and nontender with pos end  insp Hoover's  in the supine position.  No bruits or organomegaly appreciated   MS:  Nl gait/ ext warm without deformities Or obvious joint restrictions  calf tenderness, cyanosis or clubbing     SKIN: warm and dry without lesions    NEURO:  alert, approp, nl sensorium with  no motor or cerebellar deficits apparent.          I personally reviewed images and agree with radiology impression as follows:   Chest LDCT 04/04/23    1. Lung-RADS 3, probably benign findings. Short-term follow-up in 6 months is recommended with repeat low-dose chest CT without contrast (please use the following order, "CT CHEST LCS NODULE FOLLOW-UP W/OCM"). Somewhat plaque-like nodular solid focus of consolidation in the posterior apical right upper lobe measuring 7.3 mm in volume derived mean diameter. 2. Three-vessel coronary atherosclerosis. 3. Aortic Atherosclerosis (ICD10-I70.0) and Emphysema (ICD10-J43.9).      Assessment:

## 2023-08-28 ENCOUNTER — Ambulatory Visit (INDEPENDENT_AMBULATORY_CARE_PROVIDER_SITE_OTHER): Payer: Medicaid Other | Admitting: Internal Medicine

## 2023-08-28 ENCOUNTER — Encounter: Payer: Self-pay | Admitting: Internal Medicine

## 2023-08-28 DIAGNOSIS — J449 Chronic obstructive pulmonary disease, unspecified: Secondary | ICD-10-CM

## 2023-08-28 DIAGNOSIS — J9612 Chronic respiratory failure with hypercapnia: Secondary | ICD-10-CM

## 2023-08-28 DIAGNOSIS — J9611 Chronic respiratory failure with hypoxia: Secondary | ICD-10-CM

## 2023-08-28 MED ORDER — OHTUVAYRE 3 MG/2.5ML IN SUSP: 3.0000 mL | Freq: Two times a day (BID) | RESPIRATORY_TRACT | 11 refills | Status: DC

## 2023-08-28 NOTE — Assessment & Plan Note (Signed)
Quit smoking 2012  12/20/2015  extensive coaching HFA effectiveness =    75% > continue symbicort 160 2bid - Spirometry 01/17/2016  FEV1 1.62 (44%)  Ratio 61 - 05/28/2016   try bevespi 2bid > improved 09/03/2016 but decided liked symbicort better = 80 bid> increased to 160 2bid  08/13/17  - 04/14/2018  After extensive coaching inhaler device  effectiveness =   90% p 4 tries  - 07/14/2018  After extensive coaching inhaler device  effectiveness =    90%  - PFT's  07/14/2018  FEV1 1.80 (57 % ) ratio 60  p 9 % improvement from saba p nothing prior to study with DLCO  94 % corrects to 121 % for alv volume   And erv 16% @ 290 lbs - 01/21/2023  After extensive coaching inhaler device,  effectiveness =    90% with SMI > add spiriva 2.5 2 each am  - 08/28/2023 trial of ohtuvayre due to continued aecopd requiring prednisone    Group D (now reclassified as E) in terms of symptom/risk and laba/lama/ICS  therefore appropriate rx at this point >>>  symb/spiriva and prn saba plus trial of ohtuvayre as outlined above

## 2023-08-28 NOTE — Patient Instructions (Addendum)
Ohtuvare one vial twice daily in addition to your present medications and your cough and need for albuterol and breathing problems should improve   Please schedule a follow up visit in 6 months but call sooner if needed

## 2023-08-28 NOTE — Assessment & Plan Note (Signed)
On 02 3lpm since d/c 11/18/2015 - HCO3  34  12/14/15  - 01/17/2016 sats mid 90's RA at rest so rec 2lpm sleeping / exerting but none needed at rest  - 04/16/2016  Walked 4lpm pulsed x 3 laps @ 185 ft each stopped due to end of study, mild sob, no desat  - 08/13/2017  Walked RA x 3 laps @ 185 ft each stopped due to  End of study, nl to mod fast pace, no desat , min sob   - 01/13/2018  Walked RA x 3 laps @ 185 ft each stopped due to  End of study, nl pace, no sob or desat   - HCO3  01/13/2018  =  30 c/w only mild hypercarbia   - ONO RA  04/01/18 desat x 5 h and 20 min so needs 2lpm   - 04/14/2018  Walked RA x 3 laps @ 185 ft each stopped due to  End of study, sats 88% corrected on 2lpm  - 04/22/18  ono on 3lpm = 1h 44 min  So rec 4lpm and repeat on 4lpm > done 05/01/18  only 12 min sat @ < 89% so no change rx  - 10/20/2018 pt titrated down to 2lpm with wt loss and "sleeps great"   Advised: Make sure you check your oxygen saturation  AT  your highest level of activity (not after you stop)   to be sure it stays over 90% and adjust  02 flow upward to maintain this level if needed but remember to turn it back to previous settings when you stop (to conserve your supply).          Each maintenance medication was reviewed in detail including emphasizing most importantly the difference between maintenance and prns and under what circumstances the prns are to be triggered using an action plan format where appropriate.  Total time for H and P, chart review, counseling, reviewing smi/hfa/neb device(s) and generating customized AVS unique to this office visit / same day charting > 30 min

## 2023-08-29 ENCOUNTER — Telehealth: Payer: Self-pay | Admitting: Pharmacist

## 2023-08-29 DIAGNOSIS — J449 Chronic obstructive pulmonary disease, unspecified: Secondary | ICD-10-CM

## 2023-08-29 NOTE — Telephone Encounter (Signed)
Received Ohtuvayre enrollment form from [facility] clinic. Completed and faxed w insurance card to [employer]  Fax: [phone] Phone: [phone]  Chesley Mires, PharmD, MPH, BCPS, CPP Clinical Pharmacist (Rheumatology and Pulmonology)

## 2023-09-10 DIAGNOSIS — J449 Chronic obstructive pulmonary disease, unspecified: Secondary | ICD-10-CM | POA: Diagnosis not present

## 2023-09-10 DIAGNOSIS — J9611 Chronic respiratory failure with hypoxia: Secondary | ICD-10-CM | POA: Diagnosis not present

## 2023-09-11 ENCOUNTER — Other Ambulatory Visit: Payer: Self-pay | Admitting: Family Medicine

## 2023-09-11 DIAGNOSIS — N451 Epididymitis: Secondary | ICD-10-CM

## 2023-09-11 DIAGNOSIS — G629 Polyneuropathy, unspecified: Secondary | ICD-10-CM

## 2023-09-11 DIAGNOSIS — F411 Generalized anxiety disorder: Secondary | ICD-10-CM

## 2023-09-13 NOTE — Telephone Encounter (Signed)
Called [facility] for update on Ohtuvayre referral. Per rep, she talked to him on 09/11/2023 and completed welcome call.  Rx was triaged to [facility]. Per pharmacy, prior authorization is pending on 09/06/2023. Patient may be eligible for bridge program.  Phone: [phone]  Chesley Mires, PharmD, MPH, BCPS, CPP Clinical Pharmacist (Rheumatology and Pulmonology)

## 2023-09-17 ENCOUNTER — Telehealth: Payer: Self-pay

## 2023-09-17 NOTE — Telephone Encounter (Signed)
PA need for pt  Kenneth Casey   [facility]  07/09/2056

## 2023-09-18 NOTE — Telephone Encounter (Signed)
Per separate encounter:   Submitted a Prior Authorization request to [facility] MEDICAID for [facility] via CoverMyMeds. Will update once we receive a response.  Key: [id number]

## 2023-09-18 NOTE — Telephone Encounter (Signed)
Myrna from [employer] needs prior authorization. Myrna phone number is [phone].

## 2023-09-23 DIAGNOSIS — N401 Enlarged prostate with lower urinary tract symptoms: Secondary | ICD-10-CM | POA: Diagnosis not present

## 2023-09-23 DIAGNOSIS — R351 Nocturia: Secondary | ICD-10-CM | POA: Diagnosis not present

## 2023-09-23 NOTE — Telephone Encounter (Signed)
Received a fax regarding Prior Authorization from [facility] MEDICAID for [facility]. Authorization has been DENIED because: (1) You have failed one preferred drug as confirmed by claims history or submission of medical records. The preferred drugs: roflumilast tablet, Anoro Ellipta, Atrovent HFA, Combivent Respimat, Incruse Ellipta, ipratropium nebulizer solution, ipratropium/albuterol nebulizer solution and Stiolto Respimat. (2) You cannot use one preferred drug (please specify contraindication or intolerance).  Patient has tried Duonebs in the past. Left VM for patient to see if he is okay with [place] signing appeal request form on his behalf  Appeal letter drafted for submission pending response from pt Case # [id number]  Chesley Mires, PharmD, MPH, BCPS, CPP Clinical Pharmacist (Rheumatology and Pulmonology)

## 2023-09-26 NOTE — Telephone Encounter (Signed)
Received return call from patient. He has provided verbal consent to sign appeal form on his behalf to move forward with [facility] appeal.  Submitted an URGENT appeal to [facility] MEDICAID for [facility].  PA # [id number] Phone: [phone] Fax: [phone]   Chesley Mires, PharmD, MPH, BCPS, CPP Clinical Pharmacist (Rheumatology and Pulmonology)

## 2023-10-01 ENCOUNTER — Other Ambulatory Visit: Payer: Self-pay | Admitting: Family Medicine

## 2023-10-01 ENCOUNTER — Other Ambulatory Visit: Payer: Self-pay | Admitting: Internal Medicine

## 2023-10-01 DIAGNOSIS — J302 Other seasonal allergic rhinitis: Secondary | ICD-10-CM

## 2023-10-03 NOTE — Telephone Encounter (Signed)
Received VM from [facility] Medicaid in regards to expedited [facility] appeal. Authorization for [facility] has been reviewed and denial is OVERTURNED.  OHTUVAYRE is APPROVED from 09/27/2023 through 09/26/2024. Case # [id number] Approved from 09/27/23-09/26/2024  Returned call to [facility] to advise of approval. They will notate and follow-up with pharmacist and patient prn. I called patient and his wife to notify of this so he is aware that pharmacy will reach out. They verbalized understanding  Chesley Mires, PharmD, MPH, BCPS, CPP Clinical Pharmacist (Rheumatology and Pulmonology)

## 2023-10-07 ENCOUNTER — Ambulatory Visit (HOSPITAL_COMMUNITY)
Admission: RE | Admit: 2023-10-07 | Discharge: 2023-10-07 | Disposition: A | Discharge status: Home / Self Care | Payer: Medicaid Other | Source: Ambulatory Visit | Attending: Acute Care | Admitting: Acute Care

## 2023-10-07 DIAGNOSIS — R911 Solitary pulmonary nodule: Secondary | ICD-10-CM | POA: Insufficient documentation

## 2023-10-07 DIAGNOSIS — Z87891 Personal history of nicotine dependence: Secondary | ICD-10-CM | POA: Insufficient documentation

## 2023-10-13 ENCOUNTER — Other Ambulatory Visit: Payer: Self-pay | Admitting: Family Medicine

## 2023-10-13 DIAGNOSIS — F411 Generalized anxiety disorder: Secondary | ICD-10-CM

## 2023-10-13 DIAGNOSIS — N451 Epididymitis: Secondary | ICD-10-CM

## 2023-10-14 NOTE — Telephone Encounter (Signed)
Please advise Kh

## 2023-10-15 MED ORDER — OHTUVAYRE 3 MG/2.5ML IN SUSP: 3.0000 mL | Freq: Two times a day (BID) | RESPIRATORY_TRACT | 11 refills | Status: DC

## 2023-10-15 NOTE — Telephone Encounter (Signed)
Called [facility] [phone]) to check if they had been able to reach out to the patient. They were able to get in touch with him and shipped the medication on 10/04/2023.   Sofie Rower, PharmD [facility] PGY-1

## 2023-10-24 ENCOUNTER — Telehealth: Payer: Self-pay | Admitting: Internal Medicine

## 2023-10-24 NOTE — Telephone Encounter (Signed)
Pt calling in for CT Scan results

## 2023-10-28 NOTE — Telephone Encounter (Signed)
Radiology delay but should be out by end of week

## 2023-10-28 NOTE — Telephone Encounter (Signed)
Pt requesting ct results. Please advise.

## 2023-11-10 DIAGNOSIS — J9611 Chronic respiratory failure with hypoxia: Secondary | ICD-10-CM | POA: Diagnosis not present

## 2023-11-10 DIAGNOSIS — J449 Chronic obstructive pulmonary disease, unspecified: Secondary | ICD-10-CM | POA: Diagnosis not present

## 2023-11-11 ENCOUNTER — Other Ambulatory Visit: Payer: Self-pay | Admitting: Nurse Practitioner

## 2023-11-11 ENCOUNTER — Other Ambulatory Visit: Payer: Self-pay | Admitting: Family Medicine

## 2023-11-11 ENCOUNTER — Other Ambulatory Visit: Payer: Self-pay

## 2023-11-11 ENCOUNTER — Other Ambulatory Visit (HOSPITAL_COMMUNITY): Payer: Self-pay | Admitting: Nurse Practitioner

## 2023-11-11 DIAGNOSIS — F411 Generalized anxiety disorder: Secondary | ICD-10-CM

## 2023-11-11 DIAGNOSIS — Z87891 Personal history of nicotine dependence: Secondary | ICD-10-CM

## 2023-11-11 DIAGNOSIS — Z122 Encounter for screening for malignant neoplasm of respiratory organs: Secondary | ICD-10-CM

## 2023-11-11 DIAGNOSIS — J302 Other seasonal allergic rhinitis: Secondary | ICD-10-CM

## 2023-11-11 MED ORDER — ALPRAZOLAM 0.25 MG PO TABS: ORAL_TABLET | ORAL | 0 refills | Status: DC

## 2023-11-20 ENCOUNTER — Other Ambulatory Visit: Payer: Self-pay | Admitting: Family Medicine

## 2023-11-20 DIAGNOSIS — N451 Epididymitis: Secondary | ICD-10-CM

## 2023-11-29 ENCOUNTER — Telehealth: Payer: Self-pay

## 2023-11-29 NOTE — Telephone Encounter (Signed)
Copied from CRM [id number]. Topic: General - Call Back - No Documentation >> Nov 29, 2023 11:22 AM Prudencio Pair wrote: Reason for CRM: Patient stated he received a call and was left a voicemail to give a call back. Checked chart and there are no notes in regard to a phone call. Please give patient a call back. CB: [phone].

## 2023-12-09 ENCOUNTER — Other Ambulatory Visit: Payer: Self-pay

## 2023-12-09 ENCOUNTER — Telehealth: Payer: Self-pay | Admitting: Internal Medicine

## 2023-12-09 MED ORDER — AZITHROMYCIN 250 MG PO TABS: 250.0000 mg | ORAL_TABLET | Freq: Every day | ORAL | 0 refills | Status: DC

## 2023-12-09 MED ORDER — PREDNISONE 10 MG PO TABS: ORAL_TABLET | ORAL | 0 refills | Status: DC

## 2023-12-09 NOTE — Telephone Encounter (Signed)
Called and informed patient that medications have been sent to [facility]. Sent medication into [facility] .

## 2023-12-09 NOTE — Telephone Encounter (Signed)
Patient has a cough,congestion and low grade fever. He would like for something to be called in.   Pharmacy: [facility] in [place]

## 2023-12-09 NOTE — Telephone Encounter (Signed)
Zpak/ Prednisone 10 mg take  4 each am x 2 days,   2 each am x 2 days,  1 each am x 2 days and stop

## 2023-12-09 NOTE — Telephone Encounter (Signed)
Called and spoke with patient regarding message , pt stated that him and his wife tested positive over the weekend. Pt wanted to know if he could have something sent  to pharmacy

## 2023-12-11 DIAGNOSIS — J9611 Chronic respiratory failure with hypoxia: Secondary | ICD-10-CM | POA: Diagnosis not present

## 2023-12-11 DIAGNOSIS — J449 Chronic obstructive pulmonary disease, unspecified: Secondary | ICD-10-CM | POA: Diagnosis not present

## 2023-12-12 ENCOUNTER — Other Ambulatory Visit: Payer: Self-pay | Admitting: Family Medicine

## 2023-12-12 ENCOUNTER — Other Ambulatory Visit: Payer: Self-pay | Admitting: Nurse Practitioner

## 2023-12-12 DIAGNOSIS — J302 Other seasonal allergic rhinitis: Secondary | ICD-10-CM

## 2023-12-12 DIAGNOSIS — I10 Essential (primary) hypertension: Secondary | ICD-10-CM

## 2023-12-12 DIAGNOSIS — F411 Generalized anxiety disorder: Secondary | ICD-10-CM

## 2023-12-12 NOTE — Telephone Encounter (Signed)
 Please advise [facility]

## 2024-01-11 DIAGNOSIS — J9611 Chronic respiratory failure with hypoxia: Secondary | ICD-10-CM | POA: Diagnosis not present

## 2024-01-11 DIAGNOSIS — J449 Chronic obstructive pulmonary disease, unspecified: Secondary | ICD-10-CM | POA: Diagnosis not present

## 2024-01-14 ENCOUNTER — Ambulatory Visit: Payer: Self-pay | Admitting: Family Medicine

## 2024-01-22 ENCOUNTER — Encounter: Payer: Self-pay | Admitting: Nurse Practitioner

## 2024-01-27 ENCOUNTER — Other Ambulatory Visit: Payer: Self-pay | Admitting: Internal Medicine

## 2024-01-27 ENCOUNTER — Other Ambulatory Visit: Payer: Self-pay | Admitting: Family Medicine

## 2024-01-27 ENCOUNTER — Other Ambulatory Visit: Payer: Self-pay | Admitting: Nurse Practitioner

## 2024-01-27 DIAGNOSIS — G629 Polyneuropathy, unspecified: Secondary | ICD-10-CM

## 2024-01-27 DIAGNOSIS — J449 Chronic obstructive pulmonary disease, unspecified: Secondary | ICD-10-CM

## 2024-01-27 DIAGNOSIS — J302 Other seasonal allergic rhinitis: Secondary | ICD-10-CM

## 2024-02-08 DIAGNOSIS — J449 Chronic obstructive pulmonary disease, unspecified: Secondary | ICD-10-CM | POA: Diagnosis not present

## 2024-02-08 DIAGNOSIS — J9611 Chronic respiratory failure with hypoxia: Secondary | ICD-10-CM | POA: Diagnosis not present

## 2024-02-12 ENCOUNTER — Other Ambulatory Visit: Payer: Self-pay | Admitting: Internal Medicine

## 2024-02-21 ENCOUNTER — Encounter: Payer: Self-pay | Admitting: Nurse Practitioner

## 2024-02-24 ENCOUNTER — Other Ambulatory Visit: Payer: Self-pay | Admitting: Internal Medicine

## 2024-02-24 ENCOUNTER — Other Ambulatory Visit: Payer: Self-pay | Admitting: Nurse Practitioner

## 2024-02-24 DIAGNOSIS — N451 Epididymitis: Secondary | ICD-10-CM

## 2024-02-24 DIAGNOSIS — J449 Chronic obstructive pulmonary disease, unspecified: Secondary | ICD-10-CM

## 2024-02-24 NOTE — Telephone Encounter (Signed)
 Please advise [facility]

## 2024-02-25 ENCOUNTER — Other Ambulatory Visit: Payer: Self-pay | Admitting: Internal Medicine

## 2024-02-29 NOTE — Progress Notes (Unsigned)
 Subjective:    Patient ID: Kenneth Casey, male   DOB: 21-Jan-1956    MRN: [id number]  Brief patient profile:  77 yowm retired Management consultant Quit smoking around 2012 at wt around 210 and did fine until winter of 2016 cough/sob self rx with albuterol helped some hfa and neb then added BREO  And proved to have GOLD III 01/2016.     History of Present Illness  12/21/2015 1st [facility] office visit/ Kenneth Casey  wt 308  Chief Complaint  Patient presents with   HFU    Pt states that his breathing has improved some, but not baseline for him. He c/o chest congestion and has had some cough with clear sputum. He has been wheezing some. He is using albuterol inhaler 4-5 x per day and neb 2-4 x per day.   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  Rec Ohtuvare one vial twice  daily in addition to your present medications >>   your cough and need for albuterol and breathing problems should improve Please schedule a follow up visit in 6 months but call sooner if needed    03/02/2024  f/u ov/[place] office/Kenneth Casey re: GOLD 2  maint on Ohtuvayre per Mask/ neb/ symbicort /  spiriva  Dyspnea:  stops due to radicular L  leg pain / pushing buggy biggest store = [employer]  Cough: slt  grey mucus x sev weeks / finished abx sev months  for teeth lower  Sleeping: bed x 20 degree, recliner 30 degrees s  resp cc  SABA use: hfa  refill qomonth   / neb rarely  02: 2lpm hs recliner/ prn daytime but not titrating as rec to "burn fat off"   Lung cancer screening: 10/07/23 RADS 2    No obvious day to day or daytime variability or assoc excess/ purulent sputum or mucus plugs or hemoptysis or cp or chest tightness, subjective wheeze or overt sinus or hb symptoms.    Also denies any obvious fluctuation of symptoms with weather or environmental changes or other aggravating or alleviating factors except as outlined above   No unusual exposure  hx or h/o childhood pna/ asthma or knowledge of premature birth.  Current Allergies, Complete Past Medical History, Past Surgical History, Family History, and Social History were reviewed in [employer] record.  ROS  The following are not active complaints unless bolded Hoarseness, sore throat, dysphagia, dental problems, itching, sneezing,  nasal congestion or discharge of excess mucus or purulent secretions, ear ache,   fever, chills, sweats, unintended wt loss or wt gain, classically pleuritic or exertional cp,  orthopnea pnd or arm/hand swelling  or leg swelling, presyncope, palpitations, abdominal pain, anorexia, nausea, vomiting, diarrhea  or change in bowel habits or change in bladder habits, change in stools or change in urine, dysuria, hematuria,  rash, arthralgias, visual complaints, headache, numbness, weakness or ataxia  or problems with walking or coordination,  change in mood or  memory.        Current Meds  Medication Sig   Accu-Chek Softclix Lancets lancets Use as instructed   acetaminophen (TYLENOL) 500 MG tablet Take 1,000 mg by mouth every 6 (six) hours as needed for moderate pain.   albuterol (PROVENTIL) (2.5 MG/3ML) 0.083% nebulizer solution USE 1 VIAL IN NEBULIZER EVERY 4 HOURS AS NEEDED FOR WHEEZING AND FOR SHORTNESS OF BREATH   atorvastatin (LIPITOR) 20 MG tablet Take 1 tablet (20 mg total) by mouth daily.   Blood Glucose Monitoring Suppl (ACCU-CHEK GUIDE ME) w/Device KIT 1 each by Does not apply route 2 (two) times daily.   busPIRone (BUSPAR) 10 MG tablet Take 1 tablet by mouth twice daily   carvedilol (COREG) 6.25 MG tablet TAKE 1 TABLET BY MOUTH TWICE DAILY WITH A MEAL   chlorpheniramine-HYDROcodone (TUSSIONEX) 10-8 MG/5ML Take 5 mLs by mouth every 12 (twelve) hours as needed for cough.   dextromethorphan-guaiFENesin (MUCINEX DM) 30-600 MG 12hr tablet Take 1 tablet by mouth 2 (two) times daily.   diclofenac (VOLTAREN) 75 MG EC tablet Take 1 tablet (75 mg total) by mouth 2 (two) times daily.   Ensifentrine (OHTUVAYRE) 3 MG/2.5ML SUSP Inhale 3 mLs into the lungs in the morning and at bedtime.   famotidine (PEPCID) 20 MG tablet TAKE 1 TABLET BY MOUTH ONCE DAILY AFTER SUPPER   fluticasone (FLONASE) 50 MCG/ACT nasal spray Use 2 spray(s) in each nostril once daily   gabapentin (NEURONTIN) 400 MG capsule Take 1 capsule by mouth twice daily   glucose blood (ACCU-CHEK GUIDE) test strip Use as instructed   naproxen (NAPROSYN) 500 MG tablet TAKE 1 TABLET BY MOUTH TWICE DAILY WITH A MEAL   OXYGEN 2 with sleep and exertion   pantoprazole (PROTONIX) 40 MG tablet TAKE 1 TABLET BY MOUTH ONCE DAILY 30-60  MINUTES  BEFORE  FIRST  MEAL   Semaglutide, 1 MG/DOSE, (OZEMPIC, 1 MG/DOSE,) 4 MG/3ML SOPN Inject 1 mg into the skin once a week.   SYMBICORT 160-4.5 MCG/ACT inhaler INHALE 2 PUFFS BY MOUTH FIRST THING IN THE  MORNING THEN ANOTHER 2 PUFFS ABOUT 12 HOURS LATER   tamsulosin (FLOMAX) 0.4 MG CAPS capsule Take 0.4 mg by mouth daily.   Tiotropium Bromide Monohydrate (SPIRIVA RESPIMAT) 2.5 MCG/ACT AERS INHALE TWO PUFFS EACH MORNING   triamcinolone ointment (KENALOG) 0.5 % Apply topically 2 (two) times daily.   VENTOLIN HFA 108 (90 Base) MCG/ACT inhaler INHALE 1 TO 2 PUFFS BY MOUTH EVERY 6 HOURS AS NEEDED FOR WHEEZING AND FOR SHORTNESS OF BREATH          Objective:   Physical Exam  wts   03/02/2024  329  08/28/2023      302  04/30/2023      300  01/21/2023      305   08/14/2022        298  03/05/2022    299  07/14/2020      253  07/15/2019     232  04/16/2016          309 >  05/28/2016  305 > 09/03/2016   318 >  02/13/2017  323 >  08/13/2017  326 > 01/13/2018  328 > 04/14/2018  312 > 07/14/2018    290 > 10/20/2018  267 > 01/17/2016          311   12/20/15 308 lb (139.708 kg)  12/13/15 315 lb (142.883 kg)  12/08/15 312 lb (141.522 kg)      Vital signs reviewed  03/02/2024  - Note at rest 02 sats  89% on RA   General appearance:    MO (by bmi) amb wm nad / minimally congest cough on voluntary maneuver     HEENT : Oropharynx  clear   Nasal turbinates nl    NECK :  without  apparent JVD/ palpable Nodes/TM    LUNGS: no acc muscle use,  Min barrel  contour chest wall with bilateral  slightly decreased bs s audible wheeze and  without cough on insp or exp maneuvers and min  Hyperresonant  to  percussion bilaterally    CV:  RRR  no s3 or murmur or increase in P2, and no edema   ABD:  massively obese soft and nontender    MS:  Nl gait/ ext warm without deformities Or obvious joint restrictions  calf tenderness, cyanosis or clubbing     SKIN: warm and dry without lesions    NEURO:  alert, approp, nl sensorium with  no motor or cerebellar deficits apparent.         I personally reviewed images and agree with radiology impression as follows:   Chest LDSCT     10/07/23  1. Lung-RADS 2, benign appearance or  behavior. Continue annual screening with low-dose chest CT without contrast in 12 months. 2. Aortic atherosclerosis (ICD10-I70.0). Coronary artery calcification. 3.  Emphysema (ICD10-J43.9).        Assessment:

## 2024-03-02 ENCOUNTER — Ambulatory Visit: Payer: 59 | Admitting: Internal Medicine

## 2024-03-02 ENCOUNTER — Encounter: Payer: Self-pay | Admitting: Internal Medicine

## 2024-03-02 VITALS — BP 161/82 | HR 92 | Ht 68.0 in | Wt 329.0 lb

## 2024-03-02 DIAGNOSIS — J449 Chronic obstructive pulmonary disease, unspecified: Secondary | ICD-10-CM | POA: Diagnosis not present

## 2024-03-02 DIAGNOSIS — F411 Generalized anxiety disorder: Secondary | ICD-10-CM | POA: Diagnosis not present

## 2024-03-02 DIAGNOSIS — J9612 Chronic respiratory failure with hypercapnia: Secondary | ICD-10-CM

## 2024-03-02 DIAGNOSIS — J9611 Chronic respiratory failure with hypoxia: Secondary | ICD-10-CM

## 2024-03-02 MED ORDER — ALPRAZOLAM 0.25 MG PO TABS: ORAL_TABLET | ORAL | 1 refills | Status: DC

## 2024-03-02 MED ORDER — NEBULIZER MASK ADULT/TUBING MISC: 1.0000 | 5 refills | Status: AC

## 2024-03-02 NOTE — Assessment & Plan Note (Signed)
 Quit smoking 2012  12/20/2015  extensive coaching HFA effectiveness =    75% > continue symbicort 160 2bid - Spirometry 01/17/2016  FEV1 1.62 (44%)  Ratio 61 - 05/28/2016   try bevespi 2bid > improved 09/03/2016 but decided liked symbicort better = 80 bid> increased to 160 2bid  08/13/17  - 04/14/2018  After extensive coaching inhaler device  effectiveness =   90% p 4 tries  - 07/14/2018  After extensive coaching inhaler device  effectiveness =    90%  - PFT's  07/14/2018  FEV1 1.80 (57 % ) ratio 60  p 9 % improvement from saba p nothing prior to study with DLCO  94 % corrects to 121 % for alv volume   And erv 16% @ 290 lbs - 01/21/2023  After extensive coaching inhaler device,  effectiveness =    90% with SMI > add spiriva 2.5 2 each am  - 08/28/2023 trial of ohtuvayre due to continued aecopd requiring prednisone   Minimal improvement on doe(note though wt gain and L radiular leg pain may more important limiting causes) / cough/ need for SABA as of 03/02/2024 > continue ohtuvayre another 6 m then regroup

## 2024-03-02 NOTE — Assessment & Plan Note (Signed)
 Wt 210 when quit smoking  - PFTs 07/14/2018  erv =  16% at wt 290   Body mass index is 50.02 kg/m.  -  trending up again  Lab Results  Component Value Date   TSH 2.564 12/08/2015      Contributing to doe and risk of GERD/dvt/ PE >>>   reviewed the need and the process to achieve and maintain neg calorie balance > defer f/u primary care including intermittently monitoring thyroid status             Each maintenance medication was reviewed in detail including emphasizing most importantly the difference between maintenance and prns and under what circumstances the prns are to be triggered using an action plan format where appropriate.  Total time for H and P, chart review, counseling, reviewing hfa/ neb/02 pulse ox  device(s) and generating customized AVS unique to this office visit / same day charting = 32 minutes

## 2024-03-02 NOTE — Assessment & Plan Note (Signed)
 On 02 3lpm since d/c 11/18/2015 - HCO3  34  12/14/15  - 01/17/2016 sats mid 90's RA at rest so rec 2lpm sleeping / exerting but none needed at rest  - 04/16/2016  Walked 4lpm pulsed x 3 laps @ 185 ft each stopped due to end of study, mild sob, no desat  - 08/13/2017  Walked RA x 3 laps @ 185 ft each stopped due to  End of study, nl to mod fast pace, no desat , min sob   - 01/13/2018  Walked RA x 3 laps @ 185 ft each stopped due to  End of study, nl pace, no sob or desat   - HCO3  01/13/2018  =  30 c/w only mild hypercarbia   - ONO RA  04/01/18 desat x 5 h and 20 min so needs 2lpm   - 04/14/2018  Walked RA x 3 laps @ 185 ft each stopped due to  End of study, sats 88% corrected on 2lpm  - 04/22/18  ono on 3lpm = 1h 44 min  So rec 4lpm and repeat on 4lpm > done 05/01/18  only 12 min sat @ < 89% so no change rx  - 10/20/2018 pt titrated down to 2lpm with wt loss and "sleeps great"   Losing ground again since wt gain - again advised:  Make sure you check your oxygen saturation  AT  your highest level of activity (not after you stop)   to be sure it stays over 90% and adjust  02 flow upward to maintain this level if needed but remember to turn it back to previous settings when you stop (to conserve your supply).

## 2024-03-02 NOTE — Addendum Note (Signed)
 Addended by: Shelby Dubin on: 03/02/2024 11:24 AM   Modules accepted: Orders

## 2024-03-02 NOTE — Patient Instructions (Signed)
 Make sure you check your oxygen saturation  AT  your highest level of activity (not after you stop)   to be sure it stays over 90% and adjust  02 flow upward to maintain this level if needed but remember to turn it back to previous settings when you stop (to conserve your supply).   No change in our medications   Please schedule a follow up visit in 6  months but call sooner if needed

## 2024-03-09 ENCOUNTER — Telehealth: Payer: Self-pay | Admitting: Internal Medicine

## 2024-03-09 NOTE — Telephone Encounter (Signed)
 CMN received from [facility] signed and faxed back to [address] @ 4:19 pm

## 2024-03-09 NOTE — Telephone Encounter (Signed)
 CMN received from [place], faxed to [facility] for Dr.Wert to sign.

## 2024-03-09 NOTE — Telephone Encounter (Signed)
 Disregard

## 2024-03-10 DIAGNOSIS — J9611 Chronic respiratory failure with hypoxia: Secondary | ICD-10-CM | POA: Diagnosis not present

## 2024-03-10 DIAGNOSIS — J449 Chronic obstructive pulmonary disease, unspecified: Secondary | ICD-10-CM | POA: Diagnosis not present

## 2024-03-11 NOTE — Telephone Encounter (Signed)
 Rc'd fax back from [place] w/Dr. Wert's signature. Will fax to [facility], attach confirmation and send to scan

## 2024-03-17 ENCOUNTER — Other Ambulatory Visit: Payer: Self-pay | Admitting: Family Medicine

## 2024-03-17 DIAGNOSIS — J302 Other seasonal allergic rhinitis: Secondary | ICD-10-CM

## 2024-03-26 ENCOUNTER — Other Ambulatory Visit: Payer: Self-pay | Admitting: Internal Medicine

## 2024-03-26 DIAGNOSIS — N451 Epididymitis: Secondary | ICD-10-CM

## 2024-04-07 ENCOUNTER — Encounter: Payer: Self-pay | Admitting: Internal Medicine

## 2024-04-07 ENCOUNTER — Ambulatory Visit (INDEPENDENT_AMBULATORY_CARE_PROVIDER_SITE_OTHER): Payer: Self-pay | Admitting: Internal Medicine

## 2024-04-07 VITALS — BP 115/68 | HR 77 | Ht 68.0 in | Wt 331.2 lb

## 2024-04-07 DIAGNOSIS — E1169 Type 2 diabetes mellitus with other specified complication: Secondary | ICD-10-CM | POA: Insufficient documentation

## 2024-04-07 DIAGNOSIS — N400 Enlarged prostate without lower urinary tract symptoms: Secondary | ICD-10-CM | POA: Diagnosis not present

## 2024-04-07 DIAGNOSIS — F5105 Insomnia due to other mental disorder: Secondary | ICD-10-CM

## 2024-04-07 DIAGNOSIS — F411 Generalized anxiety disorder: Secondary | ICD-10-CM | POA: Insufficient documentation

## 2024-04-07 DIAGNOSIS — R1310 Dysphagia, unspecified: Secondary | ICD-10-CM | POA: Insufficient documentation

## 2024-04-07 DIAGNOSIS — J9611 Chronic respiratory failure with hypoxia: Secondary | ICD-10-CM | POA: Diagnosis not present

## 2024-04-07 DIAGNOSIS — J9612 Chronic respiratory failure with hypercapnia: Secondary | ICD-10-CM | POA: Diagnosis not present

## 2024-04-07 DIAGNOSIS — E782 Mixed hyperlipidemia: Secondary | ICD-10-CM | POA: Insufficient documentation

## 2024-04-07 DIAGNOSIS — I10 Essential (primary) hypertension: Secondary | ICD-10-CM | POA: Diagnosis not present

## 2024-04-07 DIAGNOSIS — J449 Chronic obstructive pulmonary disease, unspecified: Secondary | ICD-10-CM | POA: Diagnosis not present

## 2024-04-07 DIAGNOSIS — R1314 Dysphagia, pharyngoesophageal phase: Secondary | ICD-10-CM | POA: Insufficient documentation

## 2024-04-07 DIAGNOSIS — G4733 Obstructive sleep apnea (adult) (pediatric): Secondary | ICD-10-CM | POA: Insufficient documentation

## 2024-04-07 DIAGNOSIS — G629 Polyneuropathy, unspecified: Secondary | ICD-10-CM

## 2024-04-07 DIAGNOSIS — Z1211 Encounter for screening for malignant neoplasm of colon: Secondary | ICD-10-CM

## 2024-04-07 DIAGNOSIS — I5032 Chronic diastolic (congestive) heart failure: Secondary | ICD-10-CM | POA: Diagnosis not present

## 2024-04-07 MED ORDER — GABAPENTIN 400 MG PO CAPS: 400.0000 mg | ORAL_CAPSULE | Freq: Two times a day (BID) | ORAL | 1 refills | Status: DC

## 2024-04-07 MED ORDER — ALPRAZOLAM 0.5 MG PO TABS: 0.5000 mg | ORAL_TABLET | Freq: Every evening | ORAL | 1 refills | Status: DC | PRN

## 2024-04-07 MED ORDER — TIRZEPATIDE 2.5 MG/0.5ML ~~LOC~~ SOAJ: 2.5000 mg | SUBCUTANEOUS | 0 refills | Status: DC

## 2024-04-07 MED ORDER — CARVEDILOL 6.25 MG PO TABS: 6.2500 mg | ORAL_TABLET | Freq: Two times a day (BID) | ORAL | 1 refills | Status: DC

## 2024-04-07 NOTE — Assessment & Plan Note (Signed)
 BP Readings from Last 1 Encounters:  04/07/24 115/68   Well-controlled with Coreg  6.25 mg BID Counseled for compliance with the medications Advised DASH diet and moderate exercise/walking, at least 150 mins/week

## 2024-04-07 NOTE — Assessment & Plan Note (Signed)
 Continue atorvastatin  20 mg once daily Check lipid profile

## 2024-04-07 NOTE — Assessment & Plan Note (Signed)
 Referred to GI Continue pantoprazole  for GERD

## 2024-04-07 NOTE — Progress Notes (Signed)
 New Patient Office Visit  Subjective:  Patient ID: Kenneth Casey, male    DOB: June 01, 1956  Age: 68 y.o. MRN: [id number]  CC:  Chief Complaint  Patient presents with   Establish Care    Pt establishing care, sees Dr. Waymond Hailey for COPD. Is taking otc sleep medication, has questions about this interfering with xanax .   Leg Pain    Pt reports right leg pain ongoing for 2 months , unsure what caused this pain. States he has gained weight, previously on ozempic  for diabetes management.     HPI Kenneth Casey is a 68 y.o. male with past medical history of HTN, HFpEF, COPD, GERD, type II DM, OSA and morbid obesity who presents for establishing care.  HTN and HFpEF: BP is well-controlled. Takes carvedilol  6.25 mg twice daily regularly. Patient denies headache, dizziness, chest pain, dyspnea or palpitations.  Type II DM: His HbA1c has been well-controlled with Ozempic  lately, but has run out of it due to insurance coverage concern since 08/24. His HbA1c was 6.5 in 2016, and has been higher than that in the past. He has gained 30 lbs since stopping Ozempic .  He admits that he needs to improve his diet.  His physical activity is limited due to chronic bilateral knee pain and chronic respiratory failure.  Has chronic fatigue, but denies any polyuria or polydipsia.  He also has chronic numbness and burning sensation on right thigh area, takes gabapentin  400 mg BID.  GERD: He takes pantoprazole  40 mg QD and Pepcid  20 mg every evening for it.  He also reports dysphagia to solids, and has to vomit the food at times due to choking sensation.  COPD: He has Symbicort  and Ohtuvayre  neb for it.  Followed by pulmonology.  He has chronic, intermittent dyspnea and wheezing.  He uses oxygen  at nighttime and with exertion.  Anxiety and insomnia: He takes buspirone  10 mg QD and Xanax  0.25 mg nightly.  He has recently stopped taking Xanax , as it was not helping with his insomnia.  He has been taking ZzzQuil 2 tablets for  insomnia with inadequate response.  OA of knee: He reports bilateral knee pain and swelling, left > right.  His pain is constant, sharp, nonradiating and worse with prolonged standing and walking.  He has been given naproxen  as needed for pain, which helps somewhat.      Past Medical History:  Diagnosis Date   COPD (chronic obstructive pulmonary disease) (HCC)    Obese    Pneumonia     Past Surgical History:  Procedure Laterality Date   CHOLECYSTECTOMY      Family History  Problem Relation Age of Onset   Cancer Mother        breast cancer, lung cancer   Diabetes Father    Cancer Maternal Aunt    Heart disease Paternal Uncle     Social History   Socioeconomic History   Marital status: Married    Spouse name: Not on file   Number of children: Not on file   Years of education: Not on file   Highest education level: Not on file  Occupational History   Not on file  Tobacco Use   Smoking status: Former    Current packs/day: 0.00    Average packs/day: 2.0 packs/day for 39.0 years (78.0 ttl pk-yrs)    Types: Cigarettes    Start date: 01/09/1972    Quit date: 01/08/2011    Years since quitting: 13.2   Smokeless tobacco: Never  Vaping Use   Vaping status: Former  Substance and Sexual Activity   Alcohol use: No    Alcohol/week: 0.0 standard drinks of alcohol   Drug use: No   Sexual activity: Yes    Birth control/protection: None  Other Topics Concern   Not on file  Social History Narrative   Not on file   Social Drivers of Health   Financial Resource Strain: Not on file  Food Insecurity: Not on file  Transportation Needs: Not on file  Physical Activity: Not on file  Stress: Not on file  Social Connections: Not on file  Intimate Partner Violence: Not on file    ROS Review of Systems  Constitutional:  Negative for chills and fever.  HENT:  Negative for congestion and sore throat.   Eyes:  Negative for pain and discharge.  Respiratory:  Positive for  shortness of breath (Chronic, intermittent) and wheezing (Intermittent). Negative for cough.   Cardiovascular:  Negative for chest pain and palpitations.  Gastrointestinal:  Negative for diarrhea, nausea and vomiting.  Endocrine: Negative for polydipsia and polyuria.  Genitourinary:  Negative for dysuria and hematuria.  Musculoskeletal:  Positive for arthralgias (Bilateral knee pain). Negative for neck pain and neck stiffness.  Skin:  Negative for rash.  Neurological:  Positive for numbness (B/l LE (thigh)). Negative for dizziness and weakness.  Psychiatric/Behavioral:  Negative for agitation and behavioral problems.     Objective:   Today's Vitals: BP 115/68   Pulse 77   Ht 5\' 8"  (1.727 m)   Wt (!) 331 lb 3.2 oz (150.2 kg)   SpO2 92%   BMI 50.36 kg/m   Physical Exam Vitals reviewed.  Constitutional:      General: He is not in acute distress.    Appearance: He is obese. He is not diaphoretic.  HENT:     Head: Normocephalic and atraumatic.     Nose: Nose normal.     Mouth/Throat:     Mouth: Mucous membranes are moist.  Eyes:     General: No scleral icterus.    Extraocular Movements: Extraocular movements intact.  Cardiovascular:     Rate and Rhythm: Normal rate and regular rhythm.     Heart sounds: Normal heart sounds. No murmur heard. Pulmonary:     Breath sounds: Normal breath sounds. No wheezing or rales.  Musculoskeletal:     Cervical back: Neck supple. No tenderness.     Right knee: Swelling present. Decreased range of motion (Adduction).     Left knee: Swelling present. Normal range of motion.     Right lower leg: No edema.     Left lower leg: No edema.  Skin:    General: Skin is warm.     Findings: No rash.  Neurological:     General: No focal deficit present.     Mental Status: He is alert and oriented to person, place, and time.     Sensory: Sensory deficit (B/l thigh) present.  Psychiatric:        Mood and Affect: Mood normal.        Behavior: Behavior  normal.     Assessment & Plan:   Problem List Items Addressed This Visit       Cardiovascular and Mediastinum   Chronic diastolic CHF (congestive heart failure) (HCC)   Last echocardiogram reviewed from chart Appears euvolemic currently Continue Coreg       Relevant Medications   carvedilol  (COREG ) 6.25 MG tablet   Essential hypertension   BP Readings  from Last 1 Encounters:  04/07/24 115/68   Well-controlled with Coreg  6.25 mg BID Counseled for compliance with the medications Advised DASH diet and moderate exercise/walking, at least 150 mins/week       Relevant Medications   carvedilol  (COREG ) 6.25 MG tablet     Respiratory   COPD GOLD 2 (Chronic)   Overall stable with Symbicort  and as needed albuterol  Has Ohtuvayre  neb as well Followed by pulmonology      Relevant Orders   CBC with Differential/Platelet   Chronic respiratory failure with hypoxia and hypercapnia (HCC)   Due to COPD Has home O2, uses it at nighttime and with exertion      OSA (obstructive sleep apnea)   Reports history of sleep apnea, but did not tolerate CPAP device Would benefit from weight loss, started Mounjaro for type II DM        Digestive   Pharyngoesophageal dysphagia   Referred to GI Continue pantoprazole  for GERD      Relevant Orders   Ambulatory referral to Gastroenterology     Endocrine   Type 2 diabetes mellitus with other specified complication (HCC) - Primary   HbA1c was more than 6.5 till 2016 Associated with HTN neuropathy Overall well-controlled, but has gained about 30 lbs since last HbA1c check and has been off of Ozempic , check HbA1c Started Mounjaro 2.5 mg qw -plan to increase dose as tolerated Advised to follow diabetic diet On statin F/u CMP and lipid panel Diabetic eye exam: Advised to follow up with Ophthalmology for diabetic eye exam      Relevant Medications   tirzepatide (MOUNJARO) 2.5 MG/0.5ML Pen   Other Relevant Orders   Microalbumin /  creatinine urine ratio   CMP14+EGFR   Hemoglobin A1c     Genitourinary   Benign prostatic hyperplasia   Followed by urology On Flomax 0.4 mg QD        Other   GAD (generalized anxiety disorder) (Chronic)   Overall well controlled with buspirone  10 mg twice daily Increased dose of Xanax  to 0.5 mg nightly for persistent insomnia      Relevant Medications   ALPRAZolam  (XANAX ) 0.5 MG tablet   Insomnia   Xanax  0.25 mg was inadequate Has been taking ZzzQuil x 2 tablets without much relief Increased dose of Xanax  to 0.5 mg nightly Continue buspirone  10 mg twice daily for anxiety      Relevant Medications   ALPRAZolam  (XANAX ) 0.5 MG tablet   Mixed hyperlipidemia   Continue atorvastatin  20 mg once daily Check lipid profile      Relevant Medications   carvedilol  (COREG ) 6.25 MG tablet   Other Relevant Orders   Lipid Profile   Other Visit Diagnoses       Colon cancer screening       Relevant Orders   Ambulatory referral to Gastroenterology     Neuropathy       Relevant Medications   gabapentin  (NEURONTIN ) 400 MG capsule       Outpatient Encounter Medications as of 04/07/2024  Medication Sig   Accu-Chek Softclix Lancets lancets Use as instructed   acetaminophen  (TYLENOL ) 500 MG tablet Take 1,000 mg by mouth every 6 (six) hours as needed for moderate pain.   albuterol  (PROVENTIL ) (2.5 MG/3ML) 0.083% nebulizer solution USE 1 VIAL IN NEBULIZER EVERY 4 HOURS AS NEEDED FOR WHEEZING AND FOR SHORTNESS OF BREATH   ALPRAZolam  (XANAX ) 0.5 MG tablet Take 1 tablet (0.5 mg total) by mouth at bedtime as needed for anxiety.  atorvastatin  (LIPITOR) 20 MG tablet Take 1 tablet (20 mg total) by mouth daily.   Blood Glucose Monitoring Suppl (ACCU-CHEK GUIDE ME) w/Device KIT 1 each by Does not apply route 2 (two) times daily.   busPIRone  (BUSPAR ) 10 MG tablet Take 1 tablet by mouth twice daily   Ensifentrine  (OHTUVAYRE ) 3 MG/2.5ML SUSP Inhale 3 mLs into the lungs in the morning and at  bedtime.   famotidine  (PEPCID ) 20 MG tablet TAKE 1 TABLET BY MOUTH ONCE DAILY AFTER SUPPER   fluticasone  (FLONASE ) 50 MCG/ACT nasal spray Use 2 spray(s) in each nostril once daily   glucose blood (ACCU-CHEK GUIDE) test strip Use as instructed   naproxen  (NAPROSYN ) 500 MG tablet TAKE 1 TABLET BY MOUTH TWICE DAILY WITH A MEAL   OXYGEN  2 with sleep and exertion   pantoprazole  (PROTONIX ) 40 MG tablet TAKE 1 TABLET BY MOUTH ONCE DAILY 30-60  MINUTES  BEFORE  FIRST  MEAL   Respiratory Therapy Supplies (NEBULIZER MASK ADULT/TUBING) MISC 1 each by Does not apply route as directed.   SYMBICORT  160-4.5 MCG/ACT inhaler INHALE 2 PUFFS BY MOUTH FIRST THING IN THE MORNING THEN ANOTHER 2 PUFFS ABOUT 12 HOURS LATER   tirzepatide (MOUNJARO) 2.5 MG/0.5ML Pen Inject 2.5 mg into the skin once a week.   VENTOLIN  HFA 108 (90 Base) MCG/ACT inhaler INHALE 1 TO 2 PUFFS BY MOUTH EVERY 6 HOURS AS NEEDED FOR WHEEZING AND FOR SHORTNESS OF BREATH   [DISCONTINUED] carvedilol  (COREG ) 6.25 MG tablet TAKE 1 TABLET BY MOUTH TWICE DAILY WITH A MEAL   [DISCONTINUED] diclofenac  (VOLTAREN ) 75 MG EC tablet Take 1 tablet (75 mg total) by mouth 2 (two) times daily.   [DISCONTINUED] gabapentin  (NEURONTIN ) 400 MG capsule Take 1 capsule by mouth twice daily   [DISCONTINUED] Tiotropium Bromide Monohydrate  (SPIRIVA  RESPIMAT) 2.5 MCG/ACT AERS INHALE TWO PUFFS EACH MORNING   carvedilol  (COREG ) 6.25 MG tablet Take 1 tablet (6.25 mg total) by mouth 2 (two) times daily with a meal.   gabapentin  (NEURONTIN ) 400 MG capsule Take 1 capsule (400 mg total) by mouth 2 (two) times daily.   tamsulosin (FLOMAX) 0.4 MG CAPS capsule Take 0.4 mg by mouth daily. (Patient not taking: Reported on 04/07/2024)   triamcinolone  ointment (KENALOG ) 0.5 % Apply topically 2 (two) times daily. (Patient not taking: Reported on 04/07/2024)   [DISCONTINUED] ALPRAZolam  (XANAX ) 0.25 MG tablet TAKE 1 TABLET BY MOUTH AT BEDTIME (Patient not taking: Reported on 04/07/2024)    [DISCONTINUED] chlorpheniramine-HYDROcodone (TUSSIONEX) 10-8 MG/5ML Take 5 mLs by mouth every 12 (twelve) hours as needed for cough. (Patient not taking: Reported on 04/07/2024)   [DISCONTINUED] dextromethorphan-guaiFENesin  (MUCINEX  DM) 30-600 MG 12hr tablet Take 1 tablet by mouth 2 (two) times daily.   [DISCONTINUED] Semaglutide , 1 MG/DOSE, (OZEMPIC , 1 MG/DOSE,) 4 MG/3ML SOPN Inject 1 mg into the skin once a week. (Patient not taking: Reported on 04/07/2024)   No facility-administered encounter medications on file as of 04/07/2024.    Follow-up: Return in about 3 months (around 07/07/2024) for DM and insomnia.   Meldon Sport, MD

## 2024-04-07 NOTE — Assessment & Plan Note (Signed)
 Reports history of sleep apnea, but did not tolerate CPAP device Would benefit from weight loss, started Mounjaro for type II DM

## 2024-04-07 NOTE — Assessment & Plan Note (Signed)
 Overall stable with Symbicort  and as needed albuterol  Has Ohtuvayre  neb as well Followed by pulmonology

## 2024-04-07 NOTE — Assessment & Plan Note (Signed)
 Last echocardiogram reviewed from chart Appears euvolemic currently Continue Coreg 

## 2024-04-07 NOTE — Assessment & Plan Note (Signed)
 Overall well controlled with buspirone  10 mg twice daily Increased dose of Xanax  to 0.5 mg nightly for persistent insomnia

## 2024-04-07 NOTE — Assessment & Plan Note (Addendum)
 HbA1c was more than 6.5 till 2016 Associated with HTN neuropathy Overall well-controlled, but has gained about 30 lbs since last HbA1c check and has been off of Ozempic , check HbA1c Started Mounjaro 2.5 mg qw -plan to increase dose as tolerated Advised to follow diabetic diet On statin F/u CMP and lipid panel Diabetic eye exam: Advised to follow up with Ophthalmology for diabetic eye exam

## 2024-04-07 NOTE — Patient Instructions (Signed)
 Please start taking Xanax  0.5 mg at bedtime for insomnia.  Please start taking Mounjaro 2.5 mg once weekly.  Please continue to take medications as prescribed.  Please continue to follow low carb diet and perform moderate exercise/walking as tolerated.

## 2024-04-07 NOTE — Assessment & Plan Note (Signed)
 Due to COPD Has home O2, uses it at nighttime and with exertion

## 2024-04-07 NOTE — Assessment & Plan Note (Signed)
 Followed by urology On Flomax 0.4 mg QD

## 2024-04-07 NOTE — Assessment & Plan Note (Signed)
 Xanax  0.25 mg was inadequate Has been taking ZzzQuil x 2 tablets without much relief Increased dose of Xanax  to 0.5 mg nightly Continue buspirone  10 mg twice daily for anxiety

## 2024-04-08 ENCOUNTER — Telehealth: Payer: Self-pay | Admitting: Pharmacy Technician

## 2024-04-08 ENCOUNTER — Other Ambulatory Visit (HOSPITAL_COMMUNITY): Payer: Self-pay

## 2024-04-08 LAB — CMP14+EGFR
ALT: 22 IU/L (ref 0–44)
AST: 23 IU/L (ref 0–40)
Albumin: 4.3 g/dL (ref 3.9–4.9)
Alkaline Phosphatase: 93 IU/L (ref 44–121)
BUN/Creatinine Ratio: 17 (ref 10–24)
BUN: 17 mg/dL (ref 8–27)
Bilirubin Total: 0.6 mg/dL (ref 0.0–1.2)
CO2: 27 mmol/L (ref 20–29)
Calcium: 9.4 mg/dL (ref 8.6–10.2)
Chloride: 99 mmol/L (ref 96–106)
Creatinine, Ser: 0.99 mg/dL (ref 0.76–1.27)
Globulin, Total: 2.5 g/dL (ref 1.5–4.5)
Glucose: 85 mg/dL (ref 70–99)
Potassium: 4.7 mmol/L (ref 3.5–5.2)
Sodium: 139 mmol/L (ref 134–144)
Total Protein: 6.8 g/dL (ref 6.0–8.5)
eGFR: 85 mL/min/{1.73_m2} (ref >59)

## 2024-04-08 LAB — CBC WITH DIFFERENTIAL/PLATELET
Basophils Absolute: 0.1 10*3/uL (ref 0.0–0.2)
Basos: 1 %
EOS (ABSOLUTE): 0.4 10*3/uL (ref 0.0–0.4)
Eos: 4 %
Hematocrit: 49.7 % (ref 37.5–51.0)
Hemoglobin: 15.7 g/dL (ref 13.0–17.7)
Immature Grans (Abs): 0 10*3/uL (ref 0.0–0.1)
Immature Granulocytes: 0 %
Lymphocytes Absolute: 1.7 10*3/uL (ref 0.7–3.1)
Lymphs: 17 %
MCH: 28.3 pg (ref 26.6–33.0)
MCHC: 31.6 g/dL (ref 31.5–35.7)
MCV: 90 fL (ref 79–97)
Monocytes Absolute: 0.7 10*3/uL (ref 0.1–0.9)
Monocytes: 7 %
Neutrophils Absolute: 6.9 10*3/uL (ref 1.4–7.0)
Neutrophils: 71 %
Platelets: 290 10*3/uL (ref 150–450)
RBC: 5.54 x10E6/uL (ref 4.14–5.80)
RDW: 13.7 % (ref 11.6–15.4)
WBC: 9.9 10*3/uL (ref 3.4–10.8)

## 2024-04-08 LAB — LIPID PANEL
Chol/HDL Ratio: 3.6 ratio (ref 0.0–5.0)
Cholesterol, Total: 115 mg/dL (ref 100–199)
HDL: 32 mg/dL — ABNORMAL LOW (ref >39)
LDL Chol Calc (NIH): 70 mg/dL (ref 0–99)
Triglycerides: 60 mg/dL (ref 0–149)
VLDL Cholesterol Cal: 13 mg/dL (ref 5–40)

## 2024-04-08 LAB — HEMOGLOBIN A1C
Est. average glucose Bld gHb Est-mCnc: 134 mg/dL
Hgb A1c MFr Bld: 6.3 % — ABNORMAL HIGH (ref 4.8–5.6)

## 2024-04-08 NOTE — Telephone Encounter (Signed)
 Pharmacy Patient Advocate Encounter  Received notification from [facility] that Prior Authorization for Mounjaro 2.5MG /0.5ML auto-injectors has been APPROVED from 04/08/2024 to 12/09/2024. Ran test claim, Copay is $0.00. This test claim was processed through [facility]- copay amounts may vary at other pharmacies due to pharmacy/plan contracts, or as the patient moves through the different stages of their insurance plan.   PA #/Case ID/Reference #: [id number]

## 2024-04-08 NOTE — Telephone Encounter (Signed)
 Pharmacy Patient Advocate Encounter   Received notification from CoverMyMeds that prior authorization for Mounjaro 2.5MG /0.5ML auto-injectors is required/requested.   Insurance verification completed.   The patient is insured through [place] .   Per test claim: PA required; PA submitted to above mentioned insurance via CoverMyMeds Key/confirmation #/EOC [id number] Status is pending

## 2024-04-09 ENCOUNTER — Other Ambulatory Visit: Payer: Self-pay | Admitting: Internal Medicine

## 2024-04-09 DIAGNOSIS — J9611 Chronic respiratory failure with hypoxia: Secondary | ICD-10-CM | POA: Diagnosis not present

## 2024-04-09 DIAGNOSIS — J449 Chronic obstructive pulmonary disease, unspecified: Secondary | ICD-10-CM | POA: Diagnosis not present

## 2024-04-09 DIAGNOSIS — E1169 Type 2 diabetes mellitus with other specified complication: Secondary | ICD-10-CM

## 2024-04-09 LAB — MICROALBUMIN / CREATININE URINE RATIO
Creatinine, Urine: 319.8 mg/dL
Microalb/Creat Ratio: 9 mg/g{creat} (ref 0–29)
Microalbumin, Urine: 30.2 ug/mL

## 2024-04-09 NOTE — Telephone Encounter (Signed)
 Mounjaro filled on 04/07/24.

## 2024-04-13 ENCOUNTER — Encounter (INDEPENDENT_AMBULATORY_CARE_PROVIDER_SITE_OTHER): Payer: Self-pay | Admitting: *Deleted

## 2024-04-14 ENCOUNTER — Telehealth: Payer: Self-pay

## 2024-04-14 NOTE — Telephone Encounter (Unsigned)
 Copied from CRM [id number]. Topic: Clinical - Medication Question >> Apr 10, 2024 10:59 AM Hilton Lucky wrote: Reason for CRM: Patient has a question about nebulizer solution. Pharmacy states and label sttes it is 75 units but he is only getting 25 units. States that pharmacy is telling patient it has to do with the way they measure it. Patient wanted to notify provider he is not using 75 vials a month.

## 2024-04-14 NOTE — Telephone Encounter (Signed)
 Likely just getting 75 ml

## 2024-04-15 NOTE — Telephone Encounter (Signed)
 Per pharmacy patient received one box, 25 vials, 75mL.  Explained this to patient, he voiced understanding.  Patient also advises he has been having increased cough x3 days, brown mucus. He denies fever. Does have some wheezing. Has been using nebs more frequently. Is taking Mucinex  as directed, has not been taking any OTC cough medication. Has been using cough drops, advised to stop using mint/menthol products and only use sugar-free hard candy or other non-mint/menthol cough drops. He would like to know if you think he needs atbx treatment.   Please advise, thank you!

## 2024-05-04 ENCOUNTER — Other Ambulatory Visit: Payer: Self-pay | Admitting: Internal Medicine

## 2024-05-04 ENCOUNTER — Other Ambulatory Visit: Payer: Self-pay | Admitting: Nurse Practitioner

## 2024-05-04 DIAGNOSIS — N451 Epididymitis: Secondary | ICD-10-CM

## 2024-05-04 DIAGNOSIS — J302 Other seasonal allergic rhinitis: Secondary | ICD-10-CM

## 2024-05-04 DIAGNOSIS — E1169 Type 2 diabetes mellitus with other specified complication: Secondary | ICD-10-CM

## 2024-05-05 ENCOUNTER — Other Ambulatory Visit: Payer: Self-pay | Admitting: Internal Medicine

## 2024-05-05 DIAGNOSIS — E1169 Type 2 diabetes mellitus with other specified complication: Secondary | ICD-10-CM

## 2024-05-05 MED ORDER — TIRZEPATIDE 5 MG/0.5ML ~~LOC~~ SOAJ
5.0000 mg | SUBCUTANEOUS | 1 refills | Status: DC
Start: 2024-05-05 — End: 2024-10-19

## 2024-05-08 ENCOUNTER — Other Ambulatory Visit: Payer: Self-pay | Admitting: Nurse Practitioner

## 2024-05-08 DIAGNOSIS — J302 Other seasonal allergic rhinitis: Secondary | ICD-10-CM

## 2024-05-10 DIAGNOSIS — J9611 Chronic respiratory failure with hypoxia: Secondary | ICD-10-CM | POA: Diagnosis not present

## 2024-05-10 DIAGNOSIS — J449 Chronic obstructive pulmonary disease, unspecified: Secondary | ICD-10-CM | POA: Diagnosis not present

## 2024-05-25 ENCOUNTER — Other Ambulatory Visit: Payer: Self-pay | Admitting: Nurse Practitioner

## 2024-05-25 DIAGNOSIS — N451 Epididymitis: Secondary | ICD-10-CM

## 2024-05-26 ENCOUNTER — Ambulatory Visit (INDEPENDENT_AMBULATORY_CARE_PROVIDER_SITE_OTHER): Admitting: Gastroenterology

## 2024-06-02 ENCOUNTER — Other Ambulatory Visit: Payer: Self-pay | Admitting: Internal Medicine

## 2024-06-02 DIAGNOSIS — F411 Generalized anxiety disorder: Secondary | ICD-10-CM

## 2024-06-02 DIAGNOSIS — N451 Epididymitis: Secondary | ICD-10-CM

## 2024-06-02 DIAGNOSIS — F5105 Insomnia due to other mental disorder: Secondary | ICD-10-CM

## 2024-06-02 MED ORDER — NAPROXEN 500 MG PO TABS: 500.0000 mg | ORAL_TABLET | Freq: Two times a day (BID) | ORAL | 2 refills | Status: DC

## 2024-06-08 ENCOUNTER — Encounter (INDEPENDENT_AMBULATORY_CARE_PROVIDER_SITE_OTHER): Payer: Self-pay | Admitting: Gastroenterology

## 2024-06-08 ENCOUNTER — Ambulatory Visit (INDEPENDENT_AMBULATORY_CARE_PROVIDER_SITE_OTHER): Admitting: Gastroenterology

## 2024-06-08 VITALS — BP 115/74 | HR 65 | Temp 97.7°F | Ht 68.0 in | Wt 303.3 lb

## 2024-06-08 DIAGNOSIS — Z1211 Encounter for screening for malignant neoplasm of colon: Secondary | ICD-10-CM | POA: Insufficient documentation

## 2024-06-08 DIAGNOSIS — R1310 Dysphagia, unspecified: Secondary | ICD-10-CM | POA: Diagnosis not present

## 2024-06-08 DIAGNOSIS — Z8719 Personal history of other diseases of the digestive system: Secondary | ICD-10-CM | POA: Diagnosis not present

## 2024-06-08 NOTE — Patient Instructions (Signed)
 We will get you scheduled for EGD and colonoscopy Please take small bites, chew thoroughly and take sips of liquids between bites  Follow up 3 months  It was a pleasure to see you today. I want to create trusting relationships with patients and provide genuine, compassionate, and quality care. I truly value your feedback! please be on the lookout for a survey regarding your visit with me today. I appreciate your input about our visit and your time in completing this!    Tahjir Silveria L. Ilda Laskin, MSN, APRN, AGNP-C Adult-Gerontology Nurse Practitioner [facility] at [facility]

## 2024-06-08 NOTE — Progress Notes (Signed)
 Referring Provider: Tobie Suzzane POUR, MD Primary Care Physician:  Tobie Suzzane POUR, MD Primary GI Physician: (new) Dr. Cinderella   Chief Complaint  Patient presents with   Dysphagia    Having trouble swallowing solid foods, feels like it gets stuck at times   HPI:   Kenneth Casey is a 68 y.o. male with past medical history of COPD, DM type II, OSA, BPH, GAD, HLD  Patient presenting today as a new patient for: Dysphagia and to schedule first ever screening colonoscopy   Patient reports intermittent dysphagia for the past few years, states since he was intubated a few years ago when he had an acute illness. He reports that he has not had issues in about 2 months. Initially symptoms occurred only with rice then with meats. He notes that food sits in mid chest and sometimes he has to drink a carbonated drink to cough it back up. Denies progression of symptoms since onset. No issues with liquids. He is on protonix  and famotidine  which controls GERD pretty well. No odynophagia, abdominal pain, nausea, vomiting. No rectal bleeding or melena. He has some constipation due to being on mounjaro . He takes colace PRN when he feels constipated which seems to help. Using naproxen  almost daily, sometimes uses ibuprofen for chronic pain.   NSAID use: aleve , uses aleve  almost daily  Social hx: no smoking in about 15 years, etoh on occasionally  Fam hx: no CRC or liver disease   Last Colonoscopy: never  Last Endoscopy: never    [employer]   06/08/24 0819  Weight: (!) 303 lb 4.8 oz (137.6 kg)     Past Medical History:  Diagnosis Date   COPD (chronic obstructive pulmonary disease) (HCC)    Obese    Pneumonia     Past Surgical History:  Procedure Laterality Date   CHOLECYSTECTOMY      Current Outpatient Medications  Medication Sig Dispense Refill   Accu-Chek Softclix Lancets lancets Use as instructed 200 each 12   acetaminophen  (TYLENOL ) 500 MG tablet Take 1,000 mg by mouth every 6 (six)  hours as needed for moderate pain.     albuterol  (PROVENTIL ) (2.5 MG/3ML) 0.083% nebulizer solution USE 1 VIAL IN NEBULIZER EVERY 4 HOURS AS NEEDED FOR WHEEZING AND FOR SHORTNESS OF BREATH 75 mL 0   ALPRAZolam  (XANAX ) 0.5 MG tablet TAKE 1 TABLET BY MOUTH AT BEDTIME AS NEEDED FOR ANXIETY 30 tablet 3   atorvastatin  (LIPITOR) 20 MG tablet Take 1 tablet (20 mg total) by mouth daily. 90 tablet 3   Blood Glucose Monitoring Suppl (ACCU-CHEK GUIDE ME) w/Device KIT 1 each by Does not apply route 2 (two) times daily. 1 kit 0   busPIRone  (BUSPAR ) 10 MG tablet Take 1 tablet by mouth twice daily 180 tablet 0   carvedilol  (COREG ) 6.25 MG tablet Take 1 tablet (6.25 mg total) by mouth 2 (two) times daily with a meal. 180 tablet 1   Ensifentrine  (OHTUVAYRE ) 3 MG/2.5ML SUSP Inhale 3 mLs into the lungs in the morning and at bedtime. 150 mL 11   famotidine  (PEPCID ) 20 MG tablet TAKE 1 TABLET BY MOUTH ONCE DAILY AFTER SUPPER 90 tablet 1   fluticasone  (FLONASE ) 50 MCG/ACT nasal spray Use 2 spray(s) in each nostril once daily 16 g 2   gabapentin  (NEURONTIN ) 400 MG capsule Take 1 capsule (400 mg total) by mouth 2 (two) times daily. 180 capsule 1   glucose blood (ACCU-CHEK GUIDE) test strip Use as instructed 200 each 12  naproxen  (NAPROSYN ) 500 MG tablet Take 1 tablet (500 mg total) by mouth 2 (two) times daily with a meal. 60 tablet 2   OXYGEN  2 with sleep and exertion     pantoprazole  (PROTONIX ) 40 MG tablet TAKE 1 TABLET BY MOUTH ONCE DAILY 30-60  MINUTES  BEFORE  FIRST  MEAL 30 tablet 0   Respiratory Therapy Supplies (NEBULIZER MASK ADULT/TUBING) MISC 1 each by Does not apply route as directed. 1 each 5   SPIRIVA  RESPIMAT 2.5 MCG/ACT AERS SMARTSIG:2 Spray(s) By Mouth Every Morning     SYMBICORT  160-4.5 MCG/ACT inhaler INHALE 2 PUFFS BY MOUTH FIRST THING IN THE MORNING , THEN REPEAT ANOTHER 2 PUFFS ABOUT 12 HOURS LATER 11 g 0   tamsulosin (FLOMAX) 0.4 MG CAPS capsule Take 0.4 mg by mouth daily.  11   tirzepatide   (MOUNJARO ) 5 MG/0.5ML Pen Inject 5 mg into the skin once a week. 6 mL 1   triamcinolone  ointment (KENALOG ) 0.5 % Apply topically 2 (two) times daily. 30 g 1   VENTOLIN  HFA 108 (90 Base) MCG/ACT inhaler INHALE 1 TO 2 PUFFS BY MOUTH EVERY 6 HOURS AS NEEDED FOR WHEEZING AND FOR SHORTNESS OF BREATH 18 g 9   No current facility-administered medications for this visit.    Allergies as of 06/08/2024 - Review Complete 06/08/2024  Allergen Reaction Noted   Adhesive [tape] Rash 01/17/2016    Social History   Socioeconomic History   Marital status: Married    Spouse name: Not on file   Number of children: Not on file   Years of education: Not on file   Highest education level: Not on file  Occupational History   Not on file  Tobacco Use   Smoking status: Former    Current packs/day: 0.00    Average packs/day: 2.0 packs/day for 39.0 years (78.0 ttl pk-yrs)    Types: Cigarettes    Start date: 01/09/1972    Quit date: 01/08/2011    Years since quitting: 13.4   Smokeless tobacco: Never  Vaping Use   Vaping status: Former  Substance and Sexual Activity   Alcohol use: No    Alcohol/week: 0.0 standard drinks of alcohol   Drug use: No   Sexual activity: Yes    Birth control/protection: None  Other Topics Concern   Not on file  Social History Narrative   Not on file   Social Drivers of Health   Financial Resource Strain: Not on file  Food Insecurity: Not on file  Transportation Needs: Not on file  Physical Activity: Not on file  Stress: Not on file  Social Connections: Not on file    Review of systems General: negative for malaise, night sweats, fever, chills, weight loss Neck: Negative for lumps, goiter, pain and significant neck swelling Resp: Negative for cough, wheezing, dyspnea at rest CV: Negative for chest pain, leg swelling, palpitations, orthopnea GI: denies melena, hematochezia, nausea, vomiting, diarrhea, odyonophagia, early satiety or unintentional weight loss.  +dysphagia +constipation  MSK: Negative for joint pain or swelling, back pain, and muscle pain. Derm: Negative for itching or rash Psych: Denies depression, anxiety, memory loss, confusion. No homicidal or suicidal ideation.  Heme: Negative for prolonged bleeding, bruising easily, and swollen nodes. Endocrine: Negative for cold or heat intolerance, polyuria, polydipsia and goiter. Neuro: negative for tremor, gait imbalance, syncope and seizures. The remainder of the review of systems is noncontributory.  Physical Exam: BP 115/74 (BP Location: Right Arm, Patient Position: Sitting, Cuff Size: Large)   Pulse  65   Temp 97.7 F (36.5 C) (Oral)   Ht 5' 8 (1.727 m)   Wt (!) 303 lb 4.8 oz (137.6 kg)   SpO2 93%   BMI 46.12 kg/m  General:   Alert and oriented. No distress noted. Pleasant and cooperative.  Head:  Normocephalic and atraumatic. Eyes:  Conjuctiva clear without scleral icterus. Mouth:  Oral mucosa pink and moist. Good dentition. No lesions. Heart: Normal rate and rhythm, s1 and s2 heart sounds present.  Lungs: Clear lung sounds in all lobes. Respirations equal and unlabored. Abdomen:  +BS, soft, non-tender and non-distended. No rebound or guarding. No HSM or masses noted. Derm: No palmar erythema or jaundice Msk:  Symmetrical without gross deformities. Normal posture. Extremities:  Without edema. Neurologic:  Alert and  oriented x4 Psych:  Alert and cooperative. Normal mood and affect.  Invalid input(s): 6 MONTHS   ASSESSMENT: Kenneth Casey is a 68 y.o. male presenting today as a new patient for dysphagia and to schedule first screening colonoscopy  Dysphagia for the past 2 years, no progression of symptoms, usually only with drier, thicker foods. History of GERD, well managed on protonix  and famotidine . Recommend proceeding with EGD for further evaluation as I cannot rule out esophageal ring, web, stricture, stenosis. Should continue with PPI/H2B, chewing precautions.  No  previous screening colonoscopy. No family history of CRC, patient is of average risk. Patient is agreeable to proceed with screening colonoscopy a time of EGD, as above.  Indications, risks and benefits of procedure discussed in detail with patient. Patient verbalized understanding and is in agreement to proceed with EGD/Colonoscopy.   PLAN:  -Continue PPI and H2B daily -schedule EGD and Colonoscopy (on mounjaro , hold for procedure) ASA III  -chewing precautions   All questions were answered, patient verbalized understanding and is in agreement with plan as outlined above.   Follow Up: 3 months   Huie Ghuman L. Odeal Welden, MSN, APRN, AGNP-C Adult-Gerontology Nurse Practitioner [facility]

## 2024-06-09 DIAGNOSIS — J9611 Chronic respiratory failure with hypoxia: Secondary | ICD-10-CM | POA: Diagnosis not present

## 2024-06-09 DIAGNOSIS — J449 Chronic obstructive pulmonary disease, unspecified: Secondary | ICD-10-CM | POA: Diagnosis not present

## 2024-06-17 ENCOUNTER — Telehealth: Payer: Self-pay | Admitting: *Deleted

## 2024-06-17 MED ORDER — PEG 3350-KCL-NA BICARB-NACL 420 G PO SOLR: 4000.0000 mL | Freq: Once | ORAL | 0 refills | Status: AC

## 2024-06-17 NOTE — Telephone Encounter (Signed)
 SPOKE WITH PT. Scheduled for TCS/EGD/ED with Dr. Cinderella 8/1. Aware to hold his Monday dose of mounjaro  prior to procedure. Rx for prep sent to pharmacy. Instructions also sent. Will call back with pre-op appt.

## 2024-06-25 ENCOUNTER — Other Ambulatory Visit: Payer: Self-pay | Admitting: Family Medicine

## 2024-06-25 DIAGNOSIS — E785 Hyperlipidemia, unspecified: Secondary | ICD-10-CM

## 2024-07-01 ENCOUNTER — Telehealth: Payer: Self-pay

## 2024-07-01 NOTE — Telephone Encounter (Signed)
 Copied from CRM #[id number]. Topic: Clinical - Medication Question >> Jul 01, 2024  4:29 PM Celestine FALCON wrote: Reason for CRM: Pt is requesting an antibiotic to be sent to his preferred pharmacy. Pt stated he is congested, and has thick dark yellow mucus. Pt's phone number is [phone] ok to leave a vm. Or [phone] his wife's phone number Lessie).   Preferred pharmacy: [facility] [address], [place] - [address] [place] [address] Phone: [phone] Fax: [phone]  Dr Darlean Please advise

## 2024-07-02 ENCOUNTER — Other Ambulatory Visit: Payer: Self-pay

## 2024-07-02 MED ORDER — AZITHROMYCIN 250 MG PO TABS: 250.0000 mg | ORAL_TABLET | Freq: Every day | ORAL | 0 refills | Status: DC

## 2024-07-02 NOTE — Telephone Encounter (Signed)
 Spoke with pt and informed him I sent in zpak and directions on how to take them,confirmed understanding, NFN

## 2024-07-06 ENCOUNTER — Telehealth: Payer: Self-pay | Admitting: *Deleted

## 2024-07-06 NOTE — Telephone Encounter (Signed)
 Patient called in. Scheduled for procedure 8/1 and needs to reschedule. Currently sick and on antibiotics. Rescheduled to 8/26 at 10am. Aware will call back with new pre-op and send new instructions.

## 2024-07-08 ENCOUNTER — Encounter (HOSPITAL_COMMUNITY)

## 2024-07-08 ENCOUNTER — Other Ambulatory Visit: Payer: Self-pay | Admitting: Internal Medicine

## 2024-07-10 DIAGNOSIS — J9611 Chronic respiratory failure with hypoxia: Secondary | ICD-10-CM | POA: Diagnosis not present

## 2024-07-15 ENCOUNTER — Encounter: Payer: Self-pay | Admitting: Internal Medicine

## 2024-07-15 ENCOUNTER — Ambulatory Visit (INDEPENDENT_AMBULATORY_CARE_PROVIDER_SITE_OTHER): Admitting: Internal Medicine

## 2024-07-15 VITALS — BP 136/72 | HR 70 | Ht 68.0 in | Wt 298.2 lb

## 2024-07-15 DIAGNOSIS — Z0001 Encounter for general adult medical examination with abnormal findings: Secondary | ICD-10-CM | POA: Diagnosis not present

## 2024-07-15 DIAGNOSIS — J449 Chronic obstructive pulmonary disease, unspecified: Secondary | ICD-10-CM

## 2024-07-15 DIAGNOSIS — E1169 Type 2 diabetes mellitus with other specified complication: Secondary | ICD-10-CM

## 2024-07-15 DIAGNOSIS — Z7985 Long-term (current) use of injectable non-insulin antidiabetic drugs: Secondary | ICD-10-CM

## 2024-07-15 DIAGNOSIS — R1310 Dysphagia, unspecified: Secondary | ICD-10-CM

## 2024-07-15 DIAGNOSIS — I10 Essential (primary) hypertension: Secondary | ICD-10-CM

## 2024-07-15 DIAGNOSIS — N401 Enlarged prostate with lower urinary tract symptoms: Secondary | ICD-10-CM

## 2024-07-15 DIAGNOSIS — R351 Nocturia: Secondary | ICD-10-CM

## 2024-07-15 NOTE — Assessment & Plan Note (Signed)
 Referred to GI - planned to get EGD Continue pantoprazole  for GERD

## 2024-07-15 NOTE — Assessment & Plan Note (Addendum)
 Lab Results  Component Value Date   HGBA1C 6.3 (H) 04/07/2024   HbA1c was more than 6.5 till 2016 Associated with HTN neuropathy Overall well-controlled now, was on Ozempic  in the past On Mounjaro  5 mg qw -plan to increase dose as tolerated, he prefers to keep same dose for now Advised to follow diabetic diet On statin F/u CMP and lipid panel Diabetic eye exam: Advised to follow up with Ophthalmology for diabetic eye exam

## 2024-07-15 NOTE — Patient Instructions (Addendum)
Please continue to take medications as prescribed.  Please continue to follow low carb diet and perform moderate exercise/walking as tolerated.  Please consider getting Tdap vaccine at local pharmacy.

## 2024-07-15 NOTE — Assessment & Plan Note (Signed)
 Followed by urology On Flomax 0.4 mg QD

## 2024-07-15 NOTE — Assessment & Plan Note (Signed)
 BMI Readings from Last 3 Encounters:  07/15/24 45.34 kg/m  06/08/24 46.12 kg/m  04/07/24 50.36 kg/m   Has lost 33 lbs since the last visit Tolerating Mounjaro  well Advised to follow low carb diet and perform moderate exercise/walking at least 150 mins/week

## 2024-07-15 NOTE — Assessment & Plan Note (Signed)
Physical exam as documented. Fasting blood tests today. Advised to get Tdap vaccines at local pharmacy.

## 2024-07-15 NOTE — Assessment & Plan Note (Signed)
 BP Readings from Last 1 Encounters:  07/15/24 (!) 146/71   Well-controlled with Coreg  6.25 mg BID Counseled for compliance with the medications Advised DASH diet and moderate exercise/walking, at least 150 mins/week

## 2024-07-15 NOTE — Assessment & Plan Note (Signed)
 Overall stable with Symbicort  and as needed albuterol  Has Ohtuvayre  neb as well Followed by pulmonology

## 2024-07-15 NOTE — Progress Notes (Signed)
 Established Patient Office Visit  Subjective:  Patient ID: Kenneth Casey, male    DOB: October 09, 1956  Age: 68 y.o. MRN: [id number]  CC:  Chief Complaint  Patient presents with   Diabetes    Follow up   Insomnia    Follow up   Annual Exam    HPI Kenneth Casey is a 68 y.o. male with past medical history of HTN, HFpEF, COPD, GERD, type II DM, OSA and morbid obesity who presents for f/u of his chronic medical conditions.  HTN and HFpEF: BP is well-controlled. Takes carvedilol  6.25 mg twice daily regularly. Patient denies headache, dizziness, chest pain, dyspnea or palpitations.   Type II DM: His HbA1c was 6.3 in 04/25. His HbA1c was 6.5 in 2016, and has been higher than that in the past. He has lost 33 lbs since starting Mounjaro .  He admits that he still needs to improve his diet.  His physical activity is limited due to chronic bilateral knee pain and chronic respiratory failure.  Has chronic fatigue, but denies any polyuria or polydipsia.  He also has chronic numbness and burning sensation on right thigh area, takes gabapentin  400 mg BID.   GERD: He takes pantoprazole  40 mg QD and Pepcid  20 mg every evening for it.  He also reports dysphagia to solids, and has to vomit the food at times due to choking sensation.  He was evaluated by GI, and is planned to get EGD.   COPD: He has Symbicort  and Ohtuvayre  neb for it.  Followed by pulmonology.  He has chronic, intermittent dyspnea and wheezing.  He uses oxygen  at nighttime and with exertion.   Anxiety and insomnia: He takes buspirone  10 mg QD and Xanax  0.25 mg nightly.  OA of knee: He reports bilateral knee pain and swelling, left > right.  His pain is constant, sharp, nonradiating and worse with prolonged standing and walking.  He has been given naproxen  as needed for pain, which helps somewhat.    Past Medical History:  Diagnosis Date   COPD (chronic obstructive pulmonary disease) (HCC)    Obese    Pneumonia     Past Surgical  History:  Procedure Laterality Date   CHOLECYSTECTOMY      Family History  Problem Relation Age of Onset   Cancer Mother        breast cancer, lung cancer   Diabetes Father    Cancer Maternal Aunt    Heart disease Paternal Uncle     Social History   Socioeconomic History   Marital status: Married    Spouse name: Not on file   Number of children: Not on file   Years of education: Not on file   Highest education level: Not on file  Occupational History   Not on file  Tobacco Use   Smoking status: Former    Current packs/day: 0.00    Average packs/day: 2.0 packs/day for 39.0 years (78.0 ttl pk-yrs)    Types: Cigarettes    Start date: 01/09/1972    Quit date: 01/08/2011    Years since quitting: 13.5   Smokeless tobacco: Never  Vaping Use   Vaping status: Former  Substance and Sexual Activity   Alcohol use: No    Alcohol/week: 0.0 standard drinks of alcohol   Drug use: No   Sexual activity: Yes    Birth control/protection: None  Other Topics Concern   Not on file  Social History Narrative   Not on file   Social Drivers of  Health   Financial Resource Strain: Not on file  Food Insecurity: Not on file  Transportation Needs: Not on file  Physical Activity: Not on file  Stress: Not on file  Social Connections: Not on file  Intimate Partner Violence: Not on file    Outpatient Medications Prior to Visit  Medication Sig Dispense Refill   Accu-Chek Softclix Lancets lancets Use as instructed 200 each 12   acetaminophen  (TYLENOL ) 500 MG tablet Take 1,000 mg by mouth every 6 (six) hours as needed for moderate pain.     albuterol  (PROVENTIL ) (2.5 MG/3ML) 0.083% nebulizer solution USE 1 VIAL IN NEBULIZER EVERY 4 HOURS AS NEEDED FOR WHEEZING AND FOR SHORTNESS OF BREATH 75 mL 0   ALPRAZolam  (XANAX ) 0.5 MG tablet TAKE 1 TABLET BY MOUTH AT BEDTIME AS NEEDED FOR ANXIETY 30 tablet 3   atorvastatin  (LIPITOR) 20 MG tablet Take 1 tablet by mouth once daily 90 tablet 1   Blood  Glucose Monitoring Suppl (ACCU-CHEK GUIDE ME) w/Device KIT 1 each by Does not apply route 2 (two) times daily. 1 kit 0   busPIRone  (BUSPAR ) 10 MG tablet Take 1 tablet by mouth twice daily 180 tablet 1   carvedilol  (COREG ) 6.25 MG tablet Take 1 tablet (6.25 mg total) by mouth 2 (two) times daily with a meal. 180 tablet 1   Ensifentrine  (OHTUVAYRE ) 3 MG/2.5ML SUSP Inhale 3 mLs into the lungs in the morning and at bedtime. 150 mL 11   famotidine  (PEPCID ) 20 MG tablet TAKE 1 TABLET BY MOUTH ONCE DAILY AFTER SUPPER 90 tablet 1   fluticasone  (FLONASE ) 50 MCG/ACT nasal spray Use 2 spray(s) in each nostril once daily 16 g 2   gabapentin  (NEURONTIN ) 400 MG capsule Take 1 capsule (400 mg total) by mouth 2 (two) times daily. 180 capsule 1   glucose blood (ACCU-CHEK GUIDE) test strip Use as instructed 200 each 12   naproxen  (NAPROSYN ) 500 MG tablet Take 1 tablet (500 mg total) by mouth 2 (two) times daily with a meal. 60 tablet 2   OXYGEN  2 with sleep and exertion     pantoprazole  (PROTONIX ) 40 MG tablet TAKE 1 TABLET BY MOUTH ONCE DAILY 30-60  MINUTES  BEFORE  FIRST  MEAL 30 tablet 0   Respiratory Therapy Supplies (NEBULIZER MASK ADULT/TUBING) MISC 1 each by Does not apply route as directed. 1 each 5   SPIRIVA  RESPIMAT 2.5 MCG/ACT AERS SMARTSIG:2 Spray(s) By Mouth Every Morning     SYMBICORT  160-4.5 MCG/ACT inhaler INHALE 2 PUFFS BY MOUTH FIRST THING IN THE MORNING , THEN REPEAT ANOTHER 2 PUFFS ABOUT 12 HOURS LATER 11 g 0   tamsulosin (FLOMAX) 0.4 MG CAPS capsule Take 0.4 mg by mouth daily.  11   tirzepatide  (MOUNJARO ) 5 MG/0.5ML Pen Inject 5 mg into the skin once a week. 6 mL 1   triamcinolone  ointment (KENALOG ) 0.5 % Apply topically 2 (two) times daily. 30 g 1   VENTOLIN  HFA 108 (90 Base) MCG/ACT inhaler INHALE 1 TO 2 PUFFS BY MOUTH EVERY 6 HOURS AS NEEDED FOR WHEEZING AND FOR SHORTNESS OF BREATH 18 g 9   azithromycin  (ZITHROMAX ) 250 MG tablet Take 1 tablet (250 mg total) by mouth daily. Start 2 tabs first  day, 1 tab until finished 6 tablet 0   No facility-administered medications prior to visit.    Allergies  Allergen Reactions   Adhesive [Tape] Rash    ROS Review of Systems  Constitutional:  Negative for chills and fever.  HENT:  Negative  for congestion and sore throat.   Eyes:  Negative for pain and discharge.  Respiratory:  Positive for shortness of breath (Chronic, intermittent) and wheezing (Intermittent). Negative for cough.   Cardiovascular:  Negative for chest pain and palpitations.  Gastrointestinal:  Negative for diarrhea, nausea and vomiting.  Endocrine: Negative for polydipsia and polyuria.  Genitourinary:  Negative for dysuria and hematuria.  Musculoskeletal:  Positive for arthralgias (Bilateral knee pain). Negative for neck pain and neck stiffness.  Skin:  Negative for rash.  Neurological:  Positive for numbness (B/l LE (thigh)). Negative for dizziness and weakness.  Psychiatric/Behavioral:  Negative for agitation and behavioral problems.       Objective:    Physical Exam Vitals reviewed.  Constitutional:      General: He is not in acute distress.    Appearance: He is obese. He is not diaphoretic.  HENT:     Head: Normocephalic and atraumatic.     Nose: Nose normal.     Mouth/Throat:     Mouth: Mucous membranes are moist.  Eyes:     General: No scleral icterus.    Extraocular Movements: Extraocular movements intact.  Cardiovascular:     Rate and Rhythm: Normal rate and regular rhythm.     Heart sounds: Normal heart sounds. No murmur heard. Pulmonary:     Breath sounds: Normal breath sounds. No wheezing or rales.  Musculoskeletal:     Cervical back: Neck supple. No tenderness.     Right knee: Swelling present. Decreased range of motion (Adduction).     Left knee: Swelling present. Normal range of motion.     Right lower leg: No edema.     Left lower leg: No edema.  Skin:    General: Skin is warm.     Findings: No rash.  Neurological:     General:  No focal deficit present.     Mental Status: He is alert and oriented to person, place, and time.     Sensory: Sensory deficit (B/l thigh) present.  Psychiatric:        Mood and Affect: Mood normal.        Behavior: Behavior normal.     BP 136/72 (BP Location: Left Arm)   Pulse 70   Ht 5' 8 (1.727 m)   Wt 298 lb 3.2 oz (135.3 kg)   SpO2 94%   BMI 45.34 kg/m  Wt Readings from Last 3 Encounters:  07/15/24 298 lb 3.2 oz (135.3 kg)  06/08/24 (!) 303 lb 4.8 oz (137.6 kg)  04/07/24 (!) 331 lb 3.2 oz (150.2 kg)    Lab Results  Component Value Date   TSH 2.564 12/08/2015   Lab Results  Component Value Date   WBC 9.9 04/07/2024   HGB 15.7 04/07/2024   HCT 49.7 04/07/2024   MCV 90 04/07/2024   PLT 290 04/07/2024   Lab Results  Component Value Date   NA 139 04/07/2024   K 4.7 04/07/2024   CO2 27 04/07/2024   GLUCOSE 85 04/07/2024   BUN 17 04/07/2024   CREATININE 0.99 04/07/2024   BILITOT 0.6 04/07/2024   ALKPHOS 93 04/07/2024   AST 23 04/07/2024   ALT 22 04/07/2024   PROT 6.8 04/07/2024   ALBUMIN 4.3 04/07/2024   CALCIUM  9.4 04/07/2024   ANIONGAP 7 12/14/2015   EGFR 85 04/07/2024   Lab Results  Component Value Date   CHOL 115 04/07/2024   Lab Results  Component Value Date   HDL 32 (L) 04/07/2024  Lab Results  Component Value Date   LDLCALC 70 04/07/2024   Lab Results  Component Value Date   TRIG 60 04/07/2024   Lab Results  Component Value Date   CHOLHDL 3.6 04/07/2024   Lab Results  Component Value Date   HGBA1C 6.3 (H) 04/07/2024      Assessment & Plan:   Problem List Items Addressed This Visit       Cardiovascular and Mediastinum   Essential hypertension   BP Readings from Last 1 Encounters:  07/15/24 (!) 146/71   Well-controlled with Coreg  6.25 mg BID Counseled for compliance with the medications Advised DASH diet and moderate exercise/walking, at least 150 mins/week        Respiratory   COPD GOLD 2 (Chronic)   Overall stable  with Symbicort  and as needed albuterol  Has Ohtuvayre  neb as well Followed by pulmonology        Digestive   Dysphagia   Referred to GI - planned to get EGD Continue pantoprazole  for GERD        Endocrine   Type 2 diabetes mellitus with other specified complication (HCC)   Lab Results  Component Value Date   HGBA1C 6.3 (H) 04/07/2024   HbA1c was more than 6.5 till 2016 Associated with HTN neuropathy Overall well-controlled now, was on Ozempic  in the past On Mounjaro  5 mg qw -plan to increase dose as tolerated, he prefers to keep same dose for now Advised to follow diabetic diet On statin F/u CMP and lipid panel Diabetic eye exam: Advised to follow up with Ophthalmology for diabetic eye exam      Relevant Orders   CMP14+EGFR   Hemoglobin A1c     Genitourinary   Benign prostatic hyperplasia   Followed by urology On Flomax 0.4 mg QD        Other   Morbid obesity due to excess calories (HCC)   BMI Readings from Last 3 Encounters:  07/15/24 45.34 kg/m  06/08/24 46.12 kg/m  04/07/24 50.36 kg/m   Has lost 33 lbs since the last visit Tolerating Mounjaro  well Advised to follow low carb diet and perform moderate exercise/walking at least 150 mins/week      Encounter for general adult medical examination with abnormal findings - Primary   Physical exam as documented. Fasting blood tests today. Advised to get Tdap vaccines at local pharmacy.      Other Visit Diagnoses       Long-term current use of injectable noninsulin antidiabetic medication           No orders of the defined types were placed in this encounter.   Follow-up: Return in about 4 months (around 11/14/2024) for DM and HTN.    Suzzane MARLA Blanch, MD

## 2024-07-16 ENCOUNTER — Ambulatory Visit: Payer: Self-pay | Admitting: Internal Medicine

## 2024-07-16 LAB — CMP14+EGFR
ALT: 14 IU/L (ref 0–44)
AST: 17 IU/L (ref 0–40)
Albumin: 3.9 g/dL (ref 3.9–4.9)
Alkaline Phosphatase: 93 IU/L (ref 44–121)
BUN/Creatinine Ratio: 18 (ref 10–24)
BUN: 17 mg/dL (ref 8–27)
Bilirubin Total: 0.4 mg/dL (ref 0.0–1.2)
CO2: 27 mmol/L (ref 20–29)
Calcium: 9.1 mg/dL (ref 8.6–10.2)
Chloride: 101 mmol/L (ref 96–106)
Creatinine, Ser: 0.97 mg/dL (ref 0.76–1.27)
Globulin, Total: 2.7 g/dL (ref 1.5–4.5)
Glucose: 88 mg/dL (ref 70–99)
Potassium: 4.8 mmol/L (ref 3.5–5.2)
Sodium: 141 mmol/L (ref 134–144)
Total Protein: 6.6 g/dL (ref 6.0–8.5)
eGFR: 87 mL/min/1.73 (ref >59)

## 2024-07-16 LAB — HEMOGLOBIN A1C
Est. average glucose Bld gHb Est-mCnc: 131 mg/dL
Hgb A1c MFr Bld: 6.2 % — ABNORMAL HIGH (ref 4.8–5.6)

## 2024-07-19 NOTE — Progress Notes (Signed)
 Subjective:    Patient ID: Kenneth Casey, male   DOB: [birth year]    MRN: [id number]  Brief patient profile:  33 yowm retired Management consultant Quit smoking around 2012 at wt around 210 and did fine until winter of 2016 cough/sob self rx with albuterol  helped some hfa and neb then added BREO  And proved to have GOLD III 01/2016.     History of Present Illness  12/21/2015 1st [facility] office visit/ [name]  wt 308  Chief Complaint  Patient presents with   HFU    Pt states that his breathing has improved some, but not baseline for him. He c/o chest congestion and has had some cough with clear sputum. He has been wheezing some. He is using albuterol  inhaler 4-5 x per day and neb 2-4 x per day.   has very poor hfa technique on symbicort  160 / cough worse in am Doe = MMRC2 = can't walk a nl pace on a flat grade s sob rec For cough mucinex  dm 1200 mg every 12 hours as needed  Plan A = automatic = Symbicort  160 Take 2 puffs first thing in am and then another 2 puffs about 12 hours later.  Plan B= Backup Only use your albuterol  as a rescue medication to  Plan C = crisis Only use nebulizer if you try the ventolin  first and it doesn't work  Try prilosec otc 20mg   Take 30-60 min before first meal of the day and Pepcid  ac (famotidine ) 20 mg one @  bedtime until cough is completely gone for at least a week without the need for cough suppression GERD diet     08/28/2023  4 m f/u ov/[place] office/Andris Brothers re: GOLD 2 copd/ 02 dep hs maint on symbicort /spiriva    Chief Complaint  Patient presents with   Follow-up   Dyspnea:  very sedentary / slowed by paintfull numbness in L lateral thigh rx gabapentin  Cough: last few days worsening again, does best on prednisone  but adding to wt gain  Sleeping: mostly in either recliner or bed at 30 degrees s   resp cc  SABA use: 2-3 x per day / neb rarely  02: 2lpm hs and prn but rarely uses daytimes/ not monitoring either.  Rec Ohtuvare one vial twice  daily in addition to your present medications >>   your cough and need for albuterol  and breathing problems should improve Please schedule a follow up visit in 6 months but call sooner if needed    03/02/2024  f/u ov/[place] office/[name] re: GOLD 2  maint on Ohtuvayre  per Mask/ neb/ symbicort  /  spiriva   Dyspnea:  stops due to radicular L  leg pain / pushing buggy biggest store = [employer]  Cough: slt  grey mucus x sev weeks / finished abx sev months  for teeth lower  Sleeping: bed x 20 degree, recliner 30 degrees s  resp cc  SABA use: hfa  refill qo month   / neb rarely  02: 2lpm hs recliner/ prn daytime but not titrating as rec to burn fat off  Lung cancer screening: 10/07/23 RADS 2  Rec Make sure you check your oxygen  saturation  AT  your highest level of activity (not after you stop)   to be sure it stays over 90%         07/22/2024  6 m  f/u ov/[place] office/[name] re: COPD GOLD 2  maint on ohtuvayre  / symbicort  and spiriva    Chief Complaint  Patient  presents with   COPD    Congested - f/u  Dyspnea:  limited by L leg pain /  walking behind wife slow speed at [employer] most he can do  Cough: not now - completed zpak10 d prior to OV  cleared it up  Sleeping: hob up 20 degrees one pillow s resp cc  SABA use: hfa twice daily/ no longer needing the nebulizer  02: 2lpm hs only -   Lung cancer screening: in program, due scan    No obvious day to day or daytime variability or assoc excess/ purulent sputum or mucus plugs or hemoptysis or cp or chest tightness, subjective wheeze or overt sinus or hb symptoms.    Also denies any obvious fluctuation of symptoms with weather or environmental changes or other aggravating or alleviating factors except as outlined above   No unusual exposure hx or h/o childhood pna/ asthma or knowledge of premature birth.  Current Allergies, Complete Past Medical History, Past Surgical History, Family History, and Social History were reviewed in  [facility] record.  ROS  The following are not active complaints unless bolded Hoarseness, sore throat, dysphagia, dental problems, itching, sneezing,  nasal congestion or discharge of excess mucus or purulent secretions, ear ache,   fever, chills, sweats, unintended wt loss or wt gain, classically pleuritic or exertional cp,  orthopnea pnd or arm/hand swelling  or leg swelling, presyncope, palpitations, abdominal pain, anorexia, nausea, vomiting, diarrhea  or change in bowel habits or change in bladder habits, change in stools or change in urine, dysuria, hematuria,  rash, arthralgias, visual complaints, headache, numbness, weakness or ataxia or problems with walking or coordination,  change in mood or  memory.        Current Meds  Medication Sig   Accu-Chek Softclix Lancets lancets Use as instructed   acetaminophen  (TYLENOL ) 500 MG tablet Take 1,000 mg by mouth every 6 (six) hours as needed for moderate pain.   albuterol  (PROVENTIL ) (2.5 MG/3ML) 0.083% nebulizer solution USE 1 VIAL IN NEBULIZER EVERY 4 HOURS AS NEEDED FOR WHEEZING AND FOR SHORTNESS OF BREATH   ALPRAZolam  (XANAX ) 0.5 MG tablet TAKE 1 TABLET BY MOUTH AT BEDTIME AS NEEDED FOR ANXIETY   atorvastatin  (LIPITOR) 20 MG tablet Take 1 tablet by mouth once daily   Blood Glucose Monitoring Suppl (ACCU-CHEK GUIDE ME) w/Device KIT 1 each by Does not apply route 2 (two) times daily.   busPIRone  (BUSPAR ) 10 MG tablet Take 1 tablet by mouth twice daily   carvedilol  (COREG ) 6.25 MG tablet Take 1 tablet (6.25 mg total) by mouth 2 (two) times daily with a meal.   Ensifentrine  (OHTUVAYRE ) 3 MG/2.5ML SUSP Inhale 3 mLs into the lungs in the morning and at bedtime.   famotidine  (PEPCID ) 20 MG tablet TAKE 1 TABLET BY MOUTH ONCE DAILY AFTER SUPPER   fluticasone  (FLONASE ) 50 MCG/ACT nasal spray Use 2 spray(s) in each nostril once daily   gabapentin  (NEURONTIN ) 400 MG capsule Take 1 capsule (400 mg total) by mouth 2 (two) times  daily.   glucose blood (ACCU-CHEK GUIDE) test strip Use as instructed   naproxen  (NAPROSYN ) 500 MG tablet Take 1 tablet (500 mg total) by mouth 2 (two) times daily with a meal.   OXYGEN  2 with sleep and exertion   pantoprazole  (PROTONIX ) 40 MG tablet TAKE 1 TABLET BY MOUTH ONCE DAILY 30-60  MINUTES  BEFORE  FIRST  MEAL   Respiratory Therapy Supplies (NEBULIZER MASK ADULT/TUBING) MISC 1 each by Does not  apply route as directed.   SPIRIVA  RESPIMAT 2.5 MCG/ACT AERS SMARTSIG:2 Spray(s) By Mouth Every Morning   SYMBICORT  160-4.5 MCG/ACT inhaler INHALE 2 PUFFS BY MOUTH FIRST THING IN THE MORNING , THEN REPEAT ANOTHER 2 PUFFS ABOUT 12 HOURS LATER   tamsulosin (FLOMAX) 0.4 MG CAPS capsule Take 0.4 mg by mouth daily.   tirzepatide  (MOUNJARO ) 5 MG/0.5ML Pen Inject 5 mg into the skin once a week.   triamcinolone  ointment (KENALOG ) 0.5 % Apply topically 2 (two) times daily.   VENTOLIN  HFA 108 (90 Base) MCG/ACT inhaler INHALE 1 TO 2 PUFFS BY MOUTH EVERY 6 HOURS AS NEEDED FOR WHEEZING AND FOR SHORTNESS OF BREATH             Objective:   Physical Exam  wts   07/22/2024      296  03/02/2024      329  08/28/2023      302  04/30/2023      300  01/21/2023      305   08/14/2022        298  03/05/2022    299  07/14/2020      253  07/15/2019     232  04/16/2016          309 >  05/28/2016  305 > 09/03/2016   318 >  02/13/2017  323 >  08/13/2017  326 > 01/13/2018  328 > 04/14/2018  312 > 07/14/2018    290 > 10/20/2018  267 > 01/17/2016          311   12/20/15 308 lb (139.708 kg)  12/13/15 315 lb (142.883 kg)  12/08/15 312 lb (141.522 kg)     Vital signs reviewed  07/22/2024  - Note at rest 02 sats  93% on RA   General appearance:    amb MO (by bmi) wm nad     HEENT : Oropharynx  clear  Nasal turbinates nl    NECK :  without  apparent JVD/ palpable Nodes/TM    LUNGS: no acc muscle use,  Min barrel  contour chest wall with bilateral  slightly decreased bs s audible wheeze and  without cough on insp or exp maneuvers and min   Hyperresonant  to  percussion bilaterally    CV:  RRR  no s3 or murmur or increase in P2, and no edema   ABD:  quite obese soft and nontender  MS:  Nl gait/ ext warm without deformities Or obvious joint restrictions  calf tenderness, cyanosis or clubbing     SKIN: warm and dry without lesions    NEURO:  alert, approp, nl sensorium with  no motor or cerebellar deficits apparent.                     Assessment:        Assessment & Plan COPD GOLD 2 Quit smoking 2012  12/20/2015  extensive coaching HFA effectiveness =    75% > continue symbicort  160 2bid - Spirometry 01/17/2016  FEV1 1.62 (44%)  Ratio 61 - 05/28/2016   try bevespi  2bid > improved 09/03/2016 but decided liked symbicort  better = 80 bid> increased to 160 2bid  08/13/17  - 04/14/2018  After extensive coaching inhaler device  effectiveness =   90% p 4 tries  - 07/14/2018  After extensive coaching inhaler device  effectiveness =    90%  - PFT's  07/14/2018  FEV1 1.80 (57 % ) ratio 60  p 9 %  improvement from saba p nothing prior to study with DLCO  94 % corrects to 121 % for alv volume   And erv 16% @ 290 lbs - 01/21/2023  After extensive coaching inhaler device,  effectiveness =    90% with SMI > add spiriva  2.5 2 each am  - 08/28/2023 trial of ohtuvayre  due to continued aecopd requiring prednisone    Note got thru last aecopd with zpak only so rec continue ohtuvayre  neb bid   Pt is  Group D (now reclassified as E) in terms of symptom/risk and laba/lama/ICS  therefore appropriate rx at this point >>>  so continue symbciort 160 and spiriva   SMI as well   Re SABA :  I spent extra time with pt today reviewing appropriate use of albuterol  for prn use on exertion with the following points: 1) saba is for relief of sob that does not improve by walking a slower pace or resting but rather if the pt does not improve after trying this first. 2) If the pt is convinced, as many are, that saba helps recover from activity faster then it's easy to  tell if this is the case by re-challenging : ie stop, take the inhaler, then p 5 minutes try the exact same activity (intensity of workload) that just caused the symptoms and see if they are substantially diminished or not after saba 3) if there is an activity that reproducibly causes the symptoms, try the saba 15 min before the activity on alternate days   If in fact the saba really does help, then fine to continue to use it prn but advised may need to look closer at the maintenance regimen being used to achieve better control of airways disease with exertion.     Chronic respiratory failure with hypoxia and hypercapnia (HCC) On 02 3lpm since d/c 11/18/2015 - HCO3  34  12/14/15  - 01/17/2016 sats mid 90's RA at rest so rec 2lpm sleeping / exerting but none needed at rest  - 04/16/2016  Walked 4lpm pulsed x 3 laps @ 185 ft each stopped due to end of study, mild sob, no desat  - 08/13/2017  Walked RA x 3 laps @ 185 ft each stopped due to  End of study, nl to mod fast pace, no desat , min sob   - 01/13/2018  Walked RA x 3 laps @ 185 ft each stopped due to  End of study, nl pace, no sob or desat   - HCO3  01/13/2018  =  30 c/w only mild hypercarbia   - ONO RA  04/01/18 desat x 5 h and 20 min so needs 2lpm   - 04/14/2018  Walked RA x 3 laps @ 185 ft each stopped due to  End of study, sats 88% corrected on 2lpm  - 04/22/18  ono on 3lpm = 1h 44 min  So rec 4lpm and repeat on 4lpm > done 05/01/18  only 12 min sat @ < 89% so no change rx  - 10/20/2018 pt titrated down to 2lpm with wt loss and sleeps great   Only using 02 at HS @ 2lpm but not monitoring sats daytime at peak ex as prev rec on RA  see avs for instructions unique to this ov    Morbid obesity due to excess calories (HCC) Wt 210 when quit smoking  - PFTs 07/14/2018  erv =  16% at wt 290 c/w effects of obesity on lung vol   Body mass index is 45.1  kg/m.  -  trending down/ encouraged  Lab Results  Component Value Date   TSH 2.564 12/08/2015       Contributing to doe and risk of GERD/dvt/ pe  >>>   reviewed the need and the process to achieve and maintain neg calorie balance > defer f/u primary care including intermittently monitoring thyroid status            Each maintenance medication was reviewed in detail including emphasizing most importantly the difference between maintenance and prns and under what circumstances the prns are to be triggered using an action plan format where appropriate.  Total time for H and P, chart review, counseling, reviewing hfa/smi/ 02/ pulse ox  device(s) and generating customized AVS unique to this office visit / same day charting = 34 min           Patient Instructions  Make sure you check your oxygen  saturation  AT  your highest level of activity (not after you stop)   to be sure it stays over 90% and adjust  02 flow upward to maintain this level if needed but remember to turn it back to previous settings when you stop (to conserve your supply).    Use your albuterol  as a rescue medication to be used if you can't catch your breath by resting, slowing your pace,  or doing a relaxed purse lip breathing pattern.  - The less you use it, the better it will work when you need it. - Ok to use up to 2 puffs  every 4 hours if you must but call for  appointment if use goes up over your usual need - Don't leave home without it !!  (think of it like a spare tire or starter fluid for your car)    Also  Ok to try albuterol  15 min before an activity (on alternating days between the inhaler, the nebulizer and nothing )  that you know would usually make you short of breath and see if it makes any difference and if makes none then don't take albuterol  after activity unless you can't catch your breath as this means it's the resting that helps, not the albuterol .   Please schedule a follow up visit in 6 months but call sooner if needed           Ozell America, MD 07/22/2024

## 2024-07-22 ENCOUNTER — Ambulatory Visit: Admitting: Internal Medicine

## 2024-07-22 ENCOUNTER — Encounter: Payer: Self-pay | Admitting: Internal Medicine

## 2024-07-22 VITALS — BP 131/77 | HR 73 | Ht 68.0 in | Wt 296.6 lb

## 2024-07-22 DIAGNOSIS — J9612 Chronic respiratory failure with hypercapnia: Secondary | ICD-10-CM | POA: Diagnosis not present

## 2024-07-22 DIAGNOSIS — Z87891 Personal history of nicotine dependence: Secondary | ICD-10-CM | POA: Diagnosis not present

## 2024-07-22 DIAGNOSIS — J449 Chronic obstructive pulmonary disease, unspecified: Secondary | ICD-10-CM

## 2024-07-22 DIAGNOSIS — J9611 Chronic respiratory failure with hypoxia: Secondary | ICD-10-CM | POA: Diagnosis not present

## 2024-07-22 DIAGNOSIS — Z6841 Body Mass Index (BMI) 40.0 and over, adult: Secondary | ICD-10-CM

## 2024-07-22 NOTE — Assessment & Plan Note (Addendum)
 On 02 3lpm since d/c 11/18/2015 - HCO3  34  12/14/15  - 01/17/2016 sats mid 90's RA at rest so rec 2lpm sleeping / exerting but none needed at rest  - 04/16/2016  Walked 4lpm pulsed x 3 laps @ 185 ft each stopped due to end of study, mild sob, no desat  - 08/13/2017  Walked RA x 3 laps @ 185 ft each stopped due to  End of study, nl to mod fast pace, no desat , min sob   - 01/13/2018  Walked RA x 3 laps @ 185 ft each stopped due to  End of study, nl pace, no sob or desat   - HCO3  01/13/2018  =  30 c/w only mild hypercarbia   - ONO RA  04/01/18 desat x 5 h and 20 min so needs 2lpm   - 04/14/2018  Walked RA x 3 laps @ 185 ft each stopped due to  End of study, sats 88% corrected on 2lpm  - 04/22/18  ono on 3lpm = 1h 44 min  So rec 4lpm and repeat on 4lpm > done 05/01/18  only 12 min sat @ < 89% so no change rx  - 10/20/2018 pt titrated down to 2lpm with wt loss and sleeps great   Only using 02 at HS @ 2lpm but not monitoring sats daytime at peak ex as prev rec on RA  see avs for instructions unique to this ov

## 2024-07-22 NOTE — Assessment & Plan Note (Addendum)
 Wt 210 when quit smoking  - PFTs 07/14/2018  erv =  16% at wt 290 c/w effects of obesity on lung vol   Body mass index is 45.1 kg/m.  -  trending down/ encouraged  Lab Results  Component Value Date   TSH 2.564 12/08/2015      Contributing to doe and risk of GERD/dvt/ pe  >>>   reviewed the need and the process to achieve and maintain neg calorie balance > defer f/u primary care including intermittently monitoring thyroid status            Each maintenance medication was reviewed in detail including emphasizing most importantly the difference between maintenance and prns and under what circumstances the prns are to be triggered using an action plan format where appropriate.  Total time for H and P, chart review, counseling, reviewing hfa/smi/ 02/ pulse ox  device(s) and generating customized AVS unique to this office visit / same day charting = 34 min

## 2024-07-22 NOTE — Patient Instructions (Addendum)
 Make sure you check your oxygen  saturation  AT  your highest level of activity (not after you stop)   to be sure it stays over 90% and adjust  02 flow upward to maintain this level if needed but remember to turn it back to previous settings when you stop (to conserve your supply).    Use your albuterol  as a rescue medication to be used if you can't catch your breath by resting, slowing your pace,  or doing a relaxed purse lip breathing pattern.  - The less you use it, the better it will work when you need it. - Ok to use up to 2 puffs  every 4 hours if you must but call for  appointment if use goes up over your usual need - Don't leave home without it !!  (think of it like a spare tire or starter fluid for your car)    Also  Ok to try albuterol  15 min before an activity (on alternating days between the inhaler, the nebulizer and nothing )  that you know would usually make you short of breath and see if it makes any difference and if makes none then don't take albuterol  after activity unless you can't catch your breath as this means it's the resting that helps, not the albuterol .   Please schedule a follow up visit in 6 months but call sooner if needed

## 2024-07-22 NOTE — Assessment & Plan Note (Addendum)
 Quit smoking 2012  12/20/2015  extensive coaching HFA effectiveness =    75% > continue symbicort  160 2bid - Spirometry 01/17/2016  FEV1 1.62 (44%)  Ratio 61 - 05/28/2016   try bevespi  2bid > improved 09/03/2016 but decided liked symbicort  better = 80 bid> increased to 160 2bid  08/13/17  - 04/14/2018  After extensive coaching inhaler device  effectiveness =   90% p 4 tries  - 07/14/2018  After extensive coaching inhaler device  effectiveness =    90%  - PFT's  07/14/2018  FEV1 1.80 (57 % ) ratio 60  p 9 % improvement from saba p nothing prior to study with DLCO  94 % corrects to 121 % for alv volume   And erv 16% @ 290 lbs - 01/21/2023  After extensive coaching inhaler device,  effectiveness =    90% with SMI > add spiriva  2.5 2 each am  - 08/28/2023 trial of ohtuvayre  due to continued aecopd requiring prednisone    Note got thru last aecopd with zpak only so rec continue ohtuvayre  neb bid   Pt is  Group D (now reclassified as E) in terms of symptom/risk and laba/lama/ICS  therefore appropriate rx at this point >>>  so continue symbciort 160 and spiriva   SMI as well   Re SABA :  I spent extra time with pt today reviewing appropriate use of albuterol  for prn use on exertion with the following points: 1) saba is for relief of sob that does not improve by walking a slower pace or resting but rather if the pt does not improve after trying this first. 2) If the pt is convinced, as many are, that saba helps recover from activity faster then it's easy to tell if this is the case by re-challenging : ie stop, take the inhaler, then p 5 minutes try the exact same activity (intensity of workload) that just caused the symptoms and see if they are substantially diminished or not after saba 3) if there is an activity that reproducibly causes the symptoms, try the saba 15 min before the activity on alternate days   If in fact the saba really does help, then fine to continue to use it prn but advised may need to look closer at  the maintenance regimen being used to achieve better control of airways disease with exertion.

## 2024-07-27 ENCOUNTER — Telehealth: Payer: Self-pay | Admitting: *Deleted

## 2024-07-27 NOTE — Telephone Encounter (Signed)
 Returned call but no answer. Left VM that the direct line for scheduling is [phone]

## 2024-07-27 NOTE — Telephone Encounter (Signed)
 Copied from CRM #[id number]. Topic: Appointments - Scheduling Inquiry for Clinic >> Jul 27, 2024  9:59 AM Corean SAUNDERS wrote: Reason for CRM: Please call patient back to schedule his lung cancer screening per the referral in chart.   *Lung cancer scheduling line not available*

## 2024-07-30 ENCOUNTER — Other Ambulatory Visit: Payer: Self-pay | Admitting: Internal Medicine

## 2024-07-30 DIAGNOSIS — J449 Chronic obstructive pulmonary disease, unspecified: Secondary | ICD-10-CM

## 2024-08-04 ENCOUNTER — Ambulatory Visit (HOSPITAL_COMMUNITY): Payer: Self-pay | Admitting: Anesthesiology

## 2024-08-04 ENCOUNTER — Ambulatory Visit (HOSPITAL_COMMUNITY)
Admission: RE | Admit: 2024-08-04 | Discharge: 2024-08-04 | Disposition: A | Discharge status: Home / Self Care | Attending: Gastroenterology | Admitting: Gastroenterology

## 2024-08-04 ENCOUNTER — Other Ambulatory Visit: Payer: Self-pay

## 2024-08-04 ENCOUNTER — Encounter (HOSPITAL_COMMUNITY): Payer: Self-pay | Admitting: Gastroenterology

## 2024-08-04 ENCOUNTER — Encounter (HOSPITAL_COMMUNITY)
Admission: RE | Disposition: A | Discharge status: Home / Self Care | Payer: Self-pay | Source: Home / Self Care | Attending: Gastroenterology

## 2024-08-04 ENCOUNTER — Encounter (INDEPENDENT_AMBULATORY_CARE_PROVIDER_SITE_OTHER): Payer: Self-pay | Admitting: *Deleted

## 2024-08-04 DIAGNOSIS — K648 Other hemorrhoids: Secondary | ICD-10-CM | POA: Diagnosis not present

## 2024-08-04 DIAGNOSIS — R1310 Dysphagia, unspecified: Secondary | ICD-10-CM | POA: Insufficient documentation

## 2024-08-04 DIAGNOSIS — K2960 Other gastritis without bleeding: Secondary | ICD-10-CM

## 2024-08-04 DIAGNOSIS — K2970 Gastritis, unspecified, without bleeding: Secondary | ICD-10-CM | POA: Diagnosis not present

## 2024-08-04 DIAGNOSIS — I110 Hypertensive heart disease with heart failure: Secondary | ICD-10-CM | POA: Insufficient documentation

## 2024-08-04 DIAGNOSIS — K644 Residual hemorrhoidal skin tags: Secondary | ICD-10-CM | POA: Insufficient documentation

## 2024-08-04 DIAGNOSIS — G4730 Sleep apnea, unspecified: Secondary | ICD-10-CM | POA: Insufficient documentation

## 2024-08-04 DIAGNOSIS — Z87891 Personal history of nicotine dependence: Secondary | ICD-10-CM | POA: Insufficient documentation

## 2024-08-04 DIAGNOSIS — I509 Heart failure, unspecified: Secondary | ICD-10-CM | POA: Insufficient documentation

## 2024-08-04 DIAGNOSIS — E119 Type 2 diabetes mellitus without complications: Secondary | ICD-10-CM | POA: Insufficient documentation

## 2024-08-04 DIAGNOSIS — K449 Diaphragmatic hernia without obstruction or gangrene: Secondary | ICD-10-CM | POA: Diagnosis not present

## 2024-08-04 DIAGNOSIS — J449 Chronic obstructive pulmonary disease, unspecified: Secondary | ICD-10-CM | POA: Diagnosis not present

## 2024-08-04 DIAGNOSIS — K222 Esophageal obstruction: Secondary | ICD-10-CM | POA: Insufficient documentation

## 2024-08-04 DIAGNOSIS — Z139 Encounter for screening, unspecified: Secondary | ICD-10-CM | POA: Diagnosis not present

## 2024-08-04 DIAGNOSIS — Z1211 Encounter for screening for malignant neoplasm of colon: Secondary | ICD-10-CM | POA: Insufficient documentation

## 2024-08-04 DIAGNOSIS — K5730 Diverticulosis of large intestine without perforation or abscess without bleeding: Secondary | ICD-10-CM | POA: Insufficient documentation

## 2024-08-04 HISTORY — PX: ESOPHAGOGASTRODUODENOSCOPY: SHX5428

## 2024-08-04 HISTORY — PX: COLONOSCOPY: SHX5424

## 2024-08-04 HISTORY — PX: ESOPHAGEAL DILATION: SHX303

## 2024-08-04 LAB — HM COLONOSCOPY

## 2024-08-04 LAB — GLUCOSE, CAPILLARY: Glucose-Capillary: 90 mg/dL (ref 70–99)

## 2024-08-04 SURGERY — COLONOSCOPY
Anesthesia: General

## 2024-08-04 MED ORDER — LACTATED RINGERS IV SOLN: INTRAVENOUS | Status: DC

## 2024-08-04 MED ORDER — PROPOFOL 500 MG/50ML IV EMUL
INTRAVENOUS | Status: DC | PRN
  Administered 2024-08-04: 150 ug/kg/min via INTRAVENOUS

## 2024-08-04 MED ORDER — LACTATED RINGERS IV SOLN: INTRAVENOUS | Status: DC | PRN

## 2024-08-04 MED ORDER — LIDOCAINE 2% (20 MG/ML) 5 ML SYRINGE
INTRAMUSCULAR | Status: DC | PRN
  Administered 2024-08-04: 50 mg via INTRAVENOUS

## 2024-08-04 MED ORDER — PROPOFOL 10 MG/ML IV BOLUS
INTRAVENOUS | Status: DC | PRN
  Administered 2024-08-04: 100 mg via INTRAVENOUS

## 2024-08-04 NOTE — Op Note (Signed)
 [facility] Patient Name: Kenneth Casey Procedure Date: 08/04/2024 10:09 AM MRN: [id number] Date of Birth: 07-17-1956 Attending MD: Deatrice Dine , MD, [id number] CSN: [id number] Age: 68 Admit Type: Outpatient Procedure:                Colonoscopy Indications:              Screening for colorectal malignant neoplasm Providers:                Deatrice Dine, MD, Jon LABOR. Museum/gallery exhibitions officer, Charity fundraiser,                            Mickel CROME. Shirlean Balm, Technician Referring MD:              Medicines:                Monitored Anesthesia Care Complications:            No immediate complications. Estimated Blood Loss:     Estimated blood loss: none. Procedure:                Pre-Anesthesia Assessment:                           - Prior to the procedure, a History and Physical                            was performed, and patient medications and                            allergies were reviewed. The patient's tolerance of                            previous anesthesia was also reviewed. The risks                            and benefits of the procedure and the sedation                            options and risks were discussed with the patient.                            All questions were answered, and informed consent                            was obtained. Prior Anticoagulants: The patient has                            taken no anticoagulant or antiplatelet agents                            except for aspirin. ASA Grade Assessment: III - A                            patient with severe systemic disease. After  reviewing the risks and benefits, the patient was                            deemed in satisfactory condition to undergo the                            procedure.                           - Prior to the procedure, a History and Physical                            was performed, and patient medications and                            allergies were reviewed.  The patient's tolerance of                            previous anesthesia was also reviewed. The risks                            and benefits of the procedure and the sedation                            options and risks were discussed with the patient.                            All questions were answered, and informed consent                            was obtained. Prior Anticoagulants: The patient has                            taken no anticoagulant or antiplatelet agents. ASA                            Grade Assessment: III - A patient with severe                            systemic disease. After reviewing the risks and                            benefits, the patient was deemed in satisfactory                            condition to undergo the procedure.                           After obtaining informed consent, the colonoscope                            was passed under direct vision. Throughout the  procedure, the patient's blood pressure, pulse, and                            oxygen  saturations were monitored continuously. The                            [id number] ([id number]) Colon was introduced through                            the anus and advanced to the the cecum, identified                            by appendiceal orifice and ileocecal valve. The                            colonoscopy was performed without difficulty. The                            patient tolerated the procedure well. The quality                            of the bowel preparation was evaluated using the                            BBPS [facility] Bowel Preparation Scale) with scores                            of: Right Colon = 3, Transverse Colon = 3 and Left                            Colon = 3 (entire mucosa seen well with no residual                            staining, small fragments of stool or opaque                            liquid). The total BBPS score equals 9. The                             ileocecal valve, appendiceal orifice, and rectum                            were photographed. Scope In: 10:11:34 AM Scope Out: 10:21:24 AM Scope Withdrawal Time: 0 hours 8 minutes 10 seconds  Total Procedure Duration: 0 hours 9 minutes 50 seconds  Findings:      Scattered medium-mouthed diverticula were found in the entire colon.      Non-bleeding external and internal hemorrhoids were found during       retroflexion. The hemorrhoids were medium-sized. Impression:               - Diverticulosis in the entire examined colon.                           -  Non-bleeding external and internal hemorrhoids.                           - No specimens collected. Moderate Sedation:      Per Anesthesia Care Recommendation:           - Patient has a contact number available for                            emergencies. The signs and symptoms of potential                            delayed complications were discussed with the                            patient. Return to normal activities tomorrow.                            Written discharge instructions were provided to the                            patient.                           - Resume previous diet.                           - Continue present medications.                           - Repeat colonoscopy in 10 years for screening                            purposes.                           - Return to primary care physician as previously                            scheduled. Procedure Code(s):        --- Professional ---                           H9878, Colorectal cancer screening; colonoscopy on                            individual not meeting criteria for high risk Diagnosis Code(s):        --- Professional ---                           Z12.11, Encounter for screening for malignant                            neoplasm of colon                           K64.8, Other hemorrhoids  K57.30,  Diverticulosis of large intestine without                            perforation or abscess without bleeding CPT copyright 2022 American Medical Association. All rights reserved. The codes documented in this report are preliminary and upon coder review may  be revised to meet current compliance requirements. Deatrice Dine, MD Deatrice Dine, MD 08/04/2024 10:25:33 AM This report has been signed electronically. Number of Addenda: 0

## 2024-08-04 NOTE — H&P (Signed)
 Primary Care Physician:  Tobie Suzzane POUR, MD Primary Gastroenterologist:  Dr. Cinderella  Pre-Procedure History & Physical: HPI:  Kenneth Casey is a 68 y.o. male with past medical history of COPD, DM type II, OSA, BPH, GAD, HLD here for evaluation of Dysphagia and screening colonoscopy    Patient reports intermittent dysphagia for the past few years, states since he was intubated a few years ago when he had an acute illness. He reports that he has not had issues in about 2 months. Initially symptoms occurred only with rice then with meats. He notes that food sits in mid chest and sometimes he has to drink a carbonated drink to cough it back up. Denies progression of symptoms since onset. No issues with liquids.Using naproxen  almost daily, sometimes uses ibuprofen for chronic pain.    NSAID use: aleve , uses aleve  almost daily  Social hx: no smoking in about 15 years, etoh on occasionally  Fam hx: no CRC or liver disease    Last Colonoscopy: never  Last Endoscopy: never   Past Medical History:  Diagnosis Date   COPD (chronic obstructive pulmonary disease) (HCC)    Obese    Pneumonia     Past Surgical History:  Procedure Laterality Date   CHOLECYSTECTOMY      Prior to Admission medications   Medication Sig Start Date End Date Taking? Authorizing Provider  albuterol  (PROVENTIL ) (2.5 MG/3ML) 0.083% nebulizer solution USE 1 VIAL IN NEBULIZER EVERY 4 HOURS AS NEEDED FOR WHEEZING AND FOR SHORTNESS OF BREATH 07/08/24  Yes Darlean Ozell NOVAK, MD  ALPRAZolam  (XANAX ) 0.5 MG tablet TAKE 1 TABLET BY MOUTH AT BEDTIME AS NEEDED FOR ANXIETY 06/02/24  Yes Tobie Suzzane POUR, MD  atorvastatin  (LIPITOR) 20 MG tablet Take 1 tablet by mouth once daily 06/25/24  Yes Patel, Rutwik K, MD  Blood Glucose Monitoring Suppl (ACCU-CHEK GUIDE ME) w/Device KIT 1 each by Does not apply route 2 (two) times daily. 01/08/23  Yes Tilford Bertram HERO, FNP  busPIRone  (BUSPAR ) 10 MG tablet Take 1 tablet by mouth twice daily 06/25/24  Yes  Patel, Rutwik K, MD  carvedilol  (COREG ) 6.25 MG tablet Take 1 tablet (6.25 mg total) by mouth 2 (two) times daily with a meal. 04/07/24  Yes Tobie Suzzane POUR, MD  Ensifentrine  (OHTUVAYRE ) 3 MG/2.5ML SUSP Inhale 3 mLs into the lungs in the morning and at bedtime. 10/15/23  Yes Darlean Ozell NOVAK, MD  famotidine  (PEPCID ) 20 MG tablet TAKE 1 TABLET BY MOUTH ONCE DAILY AFTER SUPPER 07/30/24  Yes Darlean Ozell NOVAK, MD  fluticasone  (FLONASE ) 50 MCG/ACT nasal spray Use 2 spray(s) in each nostril once daily 05/08/24  Yes Tobie Suzzane POUR, MD  gabapentin  (NEURONTIN ) 400 MG capsule Take 1 capsule (400 mg total) by mouth 2 (two) times daily. 04/07/24  Yes Tobie Suzzane POUR, MD  glucose blood (ACCU-CHEK GUIDE) test strip Use as instructed 01/08/23  Yes Tilford Bertram HERO, FNP  naproxen  (NAPROSYN ) 500 MG tablet Take 1 tablet (500 mg total) by mouth 2 (two) times daily with a meal. 06/02/24  Yes Tobie Suzzane POUR, MD  pantoprazole  (PROTONIX ) 40 MG tablet TAKE 1 TABLET BY MOUTH ONCE DAILY 30-60  MINUTES  BEFORE  FIRST  MEAL 07/30/24  Yes Darlean Ozell NOVAK, MD  Respiratory Therapy Supplies (NEBULIZER MASK ADULT/TUBING) MISC 1 each by Does not apply route as directed. 03/02/24  Yes Darlean Ozell NOVAK, MD  SYMBICORT  160-4.5 MCG/ACT inhaler INHALE 2 PUFFS BY MOUTH FIRST THING IN THE MORNING, THEN REPEAT ANOTHER 2 PUFFS  ABOUT 12 HOURS LATER 07/30/24  Yes Darlean Ozell NOVAK, MD  tamsulosin (FLOMAX) 0.4 MG CAPS capsule Take 0.4 mg by mouth daily. 10/16/18  Yes [provider]  Tiotropium Bromide Monohydrate  (SPIRIVA  RESPIMAT) 2.5 MCG/ACT AERS INHALE 2 SPRAY(S) BY MOUTH IN THE MORNING 07/30/24  Yes Darlean Ozell NOVAK, MD  triamcinolone  ointment (KENALOG ) 0.5 % Apply topically 2 (two) times daily. 08/15/23  Yes Tilford Bertram HERO, FNP  VENTOLIN  HFA 108 (90 Base) MCG/ACT inhaler INHALE 1 TO 2 PUFFS BY MOUTH EVERY 6 HOURS AS NEEDED FOR WHEEZING AND FOR SHORTNESS OF BREATH 02/25/24  Yes Darlean Ozell NOVAK, MD  Accu-Chek Softclix Lancets lancets Use as  instructed 01/08/23   Tilford Bertram HERO, FNP  acetaminophen  (TYLENOL ) 500 MG tablet Take 1,000 mg by mouth every 6 (six) hours as needed for moderate pain.    [provider]  OXYGEN  2 with sleep and exertion    [provider]  tirzepatide  (MOUNJARO ) 5 MG/0.5ML Pen Inject 5 mg into the skin once a week. 05/05/24   Tobie Suzzane POUR, MD    Allergies as of 06/17/2024 - Review Complete 06/08/2024  Allergen Reaction Noted   Adhesive [tape] Rash 01/17/2016    Family History  Problem Relation Age of Onset   Cancer Mother        breast cancer, lung cancer   Diabetes Father    Cancer Maternal Aunt    Heart disease Paternal Uncle     Social History   Socioeconomic History   Marital status: Married    Spouse name: Not on file   Number of children: Not on file   Years of education: Not on file   Highest education level: Not on file  Occupational History   Not on file  Tobacco Use   Smoking status: Former    Current packs/day: 0.00    Average packs/day: 2.0 packs/day for 39.0 years (78.0 ttl pk-yrs)    Types: Cigarettes    Start date: 01/09/1972    Quit date: 01/08/2011    Years since quitting: 13.5   Smokeless tobacco: Never  Vaping Use   Vaping status: Never Used  Substance and Sexual Activity   Alcohol use: No    Alcohol/week: 0.0 standard drinks of alcohol   Drug use: No   Sexual activity: Yes    Birth control/protection: None  Other Topics Concern   Not on file  Social History Narrative   Not on file   Social Drivers of Health   Financial Resource Strain: Not on file  Food Insecurity: Not on file  Transportation Needs: Not on file  Physical Activity: Not on file  Stress: Not on file  Social Connections: Not on file  Intimate Partner Violence: Not on file    Review of Systems: See HPI, otherwise negative ROS  Physical Exam: Vital signs in last 24 hours:     General:   Alert,  Well-developed, well-nourished, pleasant and cooperative in  NAD Head:  Normocephalic and atraumatic. Eyes:  Sclera clear, no icterus.   Conjunctiva pink. Ears:  Normal auditory acuity. Nose:  No deformity, discharge,  or lesions. Msk:  Symmetrical without gross deformities. Normal posture. Extremities:  Without clubbing or edema. Neurologic:  Alert and  oriented x4;  grossly normal neurologically. Skin:  Intact without significant lesions or rashes. Psych:  Alert and cooperative. Normal mood and affect.  Impression/Plan:  Kenneth Casey is a 68 y.o. male with past medical history of COPD, DM type II, OSA, BPH, GAD,  HLD here for evaluation of Dysphagia and screening colonoscopy .  Proceed with EGD and dilation . Colonoscopy   The risks of the procedure including infection, bleed, or perforation as well as benefits, limitations, alternatives and imponderables have been reviewed with the patient. Questions have been answered. All parties agreeable.

## 2024-08-04 NOTE — Transfer of Care (Signed)
 Immediate Anesthesia Transfer of Care Note  Patient: Kenneth Casey  Procedure(s) Performed: COLONOSCOPY EGD (ESOPHAGOGASTRODUODENOSCOPY) DILATION, ESOPHAGUS  Patient Location: PACU and Endoscopy Unit  Anesthesia Type:General  Level of Consciousness: awake  Airway & Oxygen  Therapy: Patient Spontanous Breathing  Post-op Assessment: Report given to RN  Post vital signs: Reviewed and stable  Last Vitals:  Vitals Value Taken Time  BP    Temp    Pulse    Resp    SpO2      Last Pain:  Vitals:   08/04/24 0955  TempSrc:   PainSc: 0-No pain      Patients Stated Pain Goal: 4 (08/04/24 0940)  Complications: No notable events documented.

## 2024-08-04 NOTE — Op Note (Signed)
 [facility] Patient Name: Kenneth Casey Procedure Date: 08/04/2024 9:48 AM MRN: [id number] Date of Birth: [birth year] Attending MD: Deatrice Dine , MD, [id number] CSN: [id number] Age: 68 Admit Type: Outpatient Procedure:                Upper GI endoscopy Indications:              Dysphagia Providers:                Deatrice Dine, MD, Jon LABOR. Museum/gallery exhibitions officer, Charity fundraiser,                            Mickel CROME. Shirlean Balm, Technician Referring MD:              Medicines:                Monitored Anesthesia Care Complications:            No immediate complications. Estimated Blood Loss:     Estimated blood loss was minimal. Estimated blood                            loss: none. Procedure:                Pre-Anesthesia Assessment:                           - Prior to the procedure, a History and Physical                            was performed, and patient medications and                            allergies were reviewed. The patient's tolerance of                            previous anesthesia was also reviewed. The risks                            and benefits of the procedure and the sedation                            options and risks were discussed with the patient.                            All questions were answered, and informed consent                            was obtained. Prior Anticoagulants: The patient has                            taken no anticoagulant or antiplatelet agents                            except for aspirin. ASA Grade Assessment: III - A  patient with severe systemic disease. After                            reviewing the risks and benefits, the patient was                            deemed in satisfactory condition to undergo the                            procedure.                           After obtaining informed consent, the endoscope was                            passed under direct vision. Throughout the                             procedure, the patient's blood pressure, pulse, and                            oxygen  saturations were monitored continuously. The                            [id number] ([id number]) Upper was introduced through                            the mouth, and advanced to the second part of                            duodenum. The upper GI endoscopy was accomplished                            without difficulty. The patient tolerated the                            procedure well. Scope In: 10:01:14 AM Scope Out: 10:05:22 AM Total Procedure Duration: 0 hours 4 minutes 8 seconds  Findings:      A widely patent Schatzki ring was found in the lower third of the       esophagus. A TTS dilator was passed through the scope. Dilation with a       15-16.5-18 mm balloon dilator was performed to 18 mm. The dilation site       was examined following endoscope reinsertion and showed no change.      Mild inflammation characterized by erythema was found in the gastric       antrum. Biopsies were taken with a cold forceps for histology.      A 2 cm hiatal hernia was present.      The duodenal bulb and second portion of the duodenum were normal. Impression:               - Widely patent Schatzki ring. Dilated.                           - Gastritis. Biopsied.                           -  2 cm hiatal hernia.                           - Normal duodenal bulb and second portion of the                            duodenum. Moderate Sedation:      Per Anesthesia Care Recommendation:           - Patient has a contact number available for                            emergencies. The signs and symptoms of potential                            delayed complications were discussed with the                            patient. Return to normal activities tomorrow.                            Written discharge instructions were provided to the                            patient.                           - Resume previous  diet.                           - Continue present medications.                           - Await pathology results. Procedure Code(s):        --- Professional ---                           [phone], Esophagogastroduodenoscopy, flexible,                            transoral; with transendoscopic balloon dilation of                            esophagus (less than 30 mm diameter)                           43239, 59, Esophagogastroduodenoscopy, flexible,                            transoral; with biopsy, single or multiple Diagnosis Code(s):        --- Professional ---                           K22.2, Esophageal obstruction                           K29.70, Gastritis, unspecified, without bleeding  R13.10, Dysphagia, unspecified CPT copyright 2022 American Medical Association. All rights reserved. The codes documented in this report are preliminary and upon coder review may  be revised to meet current compliance requirements. Deatrice Dine, MD Deatrice Dine, MD 08/04/2024 10:09:51 AM This report has been signed electronically. Number of Addenda: 0

## 2024-08-04 NOTE — Anesthesia Preprocedure Evaluation (Signed)
 Anesthesia Evaluation  Patient identified by MRN, date of birth, ID band Patient awake    Reviewed: Allergy & Precautions, H&P , NPO status , Patient's Chart, lab work & pertinent test results, reviewed documented beta blocker date and time   Airway Mallampati: II  TM Distance: >3 FB Neck ROM: full    Dental no notable dental hx.    Pulmonary sleep apnea , pneumonia, COPD, former smoker   Pulmonary exam normal breath sounds clear to auscultation       Cardiovascular Exercise Tolerance: Good hypertension, +CHF   Rhythm:regular Rate:Normal     Neuro/Psych  PSYCHIATRIC DISORDERS Anxiety     negative neurological ROS     GI/Hepatic negative GI ROS, Neg liver ROS,,,  Endo/Other  diabetes    Renal/GU negative Renal ROS  negative genitourinary   Musculoskeletal   Abdominal   Peds  Hematology negative hematology ROS (+)   Anesthesia Other Findings   Reproductive/Obstetrics negative OB ROS                              Anesthesia Physical Anesthesia Plan  ASA: 3  Anesthesia Plan: General   Post-op Pain Management:    Induction:   PONV Risk Score and Plan: Propofol  infusion  Airway Management Planned:   Additional Equipment:   Intra-op Plan:   Post-operative Plan:   Informed Consent: I have reviewed the patients History and Physical, chart, labs and discussed the procedure including the risks, benefits and alternatives for the proposed anesthesia with the patient or authorized representative who has indicated his/her understanding and acceptance.     Dental Advisory Given  Plan Discussed with: CRNA  Anesthesia Plan Comments:         Anesthesia Quick Evaluation

## 2024-08-04 NOTE — Discharge Instructions (Signed)

## 2024-08-04 NOTE — Anesthesia Postprocedure Evaluation (Signed)
 Anesthesia Post Note  Patient: [name]  Procedure(s) Performed: COLONOSCOPY EGD (ESOPHAGOGASTRODUODENOSCOPY) DILATION, ESOPHAGUS  Patient location during evaluation: Endoscopy Anesthesia Type: General Level of consciousness: awake and alert Pain management: pain level controlled Vital Signs Assessment: post-procedure vital signs reviewed and stable Respiratory status: spontaneous breathing Cardiovascular status: blood pressure returned to baseline and stable Postop Assessment: no apparent nausea or vomiting Anesthetic complications: no   No notable events documented.   Last Vitals:  Vitals:   08/04/24 1029 08/04/24 1033  BP: (!) 102/45 (!) 127/55  Pulse: 91   Resp: 20   Temp: 36.7 C   SpO2: 95%     Last Pain:  Vitals:   08/04/24 1029  TempSrc: Oral  PainSc: 0-No pain                 Kenneth Casey

## 2024-08-05 LAB — SURGICAL PATHOLOGY

## 2024-08-06 ENCOUNTER — Ambulatory Visit (INDEPENDENT_AMBULATORY_CARE_PROVIDER_SITE_OTHER): Payer: Self-pay | Admitting: Gastroenterology

## 2024-08-06 ENCOUNTER — Encounter (HOSPITAL_COMMUNITY): Payer: Self-pay | Admitting: Gastroenterology

## 2024-08-10 DIAGNOSIS — J9611 Chronic respiratory failure with hypoxia: Secondary | ICD-10-CM | POA: Diagnosis not present

## 2024-08-17 NOTE — Progress Notes (Signed)
 10 yr TCS noted in recall Patient result letter mailed Patient's PCP is on EPIC

## 2024-08-20 ENCOUNTER — Other Ambulatory Visit: Payer: Self-pay | Admitting: Internal Medicine

## 2024-08-20 DIAGNOSIS — J449 Chronic obstructive pulmonary disease, unspecified: Secondary | ICD-10-CM

## 2024-08-21 NOTE — Telephone Encounter (Signed)
 Pt requesting refill of specialty medication, routing to Rx team.

## 2024-08-21 NOTE — Telephone Encounter (Signed)
 Refill sent for Kenneth Casey  to [facility]  Dose: 3mg  nebulized twice daily  Last OV: 07/22/24 Provider: Dr. Darlean  Next OV: due in Feb 2026 (not yet scheduled)  Aleck Puls, PharmD, BCPS Clinical Pharmacist  [facility]

## 2024-09-08 ENCOUNTER — Ambulatory Visit (INDEPENDENT_AMBULATORY_CARE_PROVIDER_SITE_OTHER): Admitting: Gastroenterology

## 2024-09-08 VITALS — BP 131/73 | HR 74 | Temp 97.8°F | Ht 68.0 in | Wt 298.8 lb

## 2024-09-08 DIAGNOSIS — K219 Gastro-esophageal reflux disease without esophagitis: Secondary | ICD-10-CM

## 2024-09-08 DIAGNOSIS — R1310 Dysphagia, unspecified: Secondary | ICD-10-CM

## 2024-09-08 NOTE — Patient Instructions (Signed)
 Continue protonix  and famotidine  Be mindful of greasy, spicy, fried, citrus foods, and be mindful that caffeine, carbonated drinks, chocolate and alcohol as these can increase reflux symptoms Stay upright 2-3 hours after eating, prior to lying down and avoid eating late in the evenings.  We will plan to follow up in 1 year, unless you have new GI issues, at which time I will be happy to see you sooner  It was a pleasure to see you today. I want to create trusting relationships with patients and provide genuine, compassionate, and quality care. I truly value your feedback! please be on the lookout for a survey regarding your visit with me today. I appreciate your input about our visit and your time in completing this!    Koree Schopf L. Tamera Pingley, MSN, APRN, AGNP-C Adult-Gerontology Nurse Practitioner [facility]

## 2024-09-08 NOTE — Progress Notes (Signed)
 Referring Provider: Tobie Suzzane POUR, MD Primary Care Physician:  Tobie Suzzane POUR, MD Primary GI Physician: Dr. Cinderella   Chief Complaint  Patient presents with   Follow-up    Pt not having any issues with swallowing.   HPI:   Kenneth Casey is a 68 y.o. male with past medical history of COPD, DM type II, OSA, BPH, GAD, HLD   Patient presenting today for:  Follow up of dysphagia   Last seen June, at that time having intermittent dysphagia for years. On protonix  and famotidine  without breakthrough symptoms. Using naproxen  almost daily, ibuprofen on occasion   Recommended to continue with PPI, H2B, schedule EGD and Colonoscopy, procedures as outlined below  Present: States he did well with colonoscopy but does not wish to have another one in the future.   Feels swallowing is improved since dilation of Schatzki ring. GERD well controlled on protonix  and pepcid . No breakthrough.   No red flag symptoms. Patient denies melena, hematochezia, nausea, vomiting, diarrhea, constipation, dysphagia, odyonophagia, early satiety or weight loss.   Last Colonoscopy:07/2024 Diverticulosis in the entire examined colon.                           - Non-bleeding external and internal hemorrhoids.                           - No specimens collected.  Last Endoscopy: 07/2024 - Widely patent Schatzki ring. Dilated.                           - Gastritis. Biopsied.                           - 2 cm hiatal hernia.                           - Normal duodenal bulb and second portion of the                            duodenum. A. STOMACH BIOPSY:  - Reactive gastritis with non-specific superficial vascular congestion.  - Negative for H. pylori, intestinal metaplasia, dysplasia, and  malignancy.  Repeat colonoscopy 10 years  Filed Weights   09/08/24 1151  Weight: 298 lb 12.8 oz (135.5 kg)     Past Medical History:  Diagnosis Date   COPD (chronic obstructive pulmonary disease) (HCC)    Obese    Pneumonia      Past Surgical History:  Procedure Laterality Date   CHOLECYSTECTOMY     COLONOSCOPY N/A 08/04/2024   Procedure: COLONOSCOPY;  Surgeon: Cinderella Deatrice FALCON, MD;  [facility];  Service: Endoscopy;  Laterality: N/A;  730am, asa 2   ESOPHAGEAL DILATION N/A 08/04/2024   Procedure: DILATION, ESOPHAGUS;  Surgeon: Cinderella Deatrice FALCON, MD;  [facility]: [facility];  Service: Endoscopy;  Laterality: N/A;   ESOPHAGOGASTRODUODENOSCOPY N/A 08/04/2024   Procedure: EGD (ESOPHAGOGASTRODUODENOSCOPY);  Surgeon: Cinderella Deatrice FALCON, MD;  Location: [facility];  Service: Endoscopy;  Laterality: N/A;    Current Outpatient Medications  Medication Sig Dispense Refill   Accu-Chek Softclix Lancets lancets Use as instructed 200 each 12   acetaminophen  (TYLENOL ) 500 MG tablet Take 1,000 mg by mouth every 6 (six) hours as needed for moderate  pain.     albuterol  (PROVENTIL ) (2.5 MG/3ML) 0.083% nebulizer solution USE 1 VIAL IN NEBULIZER EVERY 4 HOURS AS NEEDED FOR WHEEZING AND FOR SHORTNESS OF BREATH 75 mL 0   ALPRAZolam  (XANAX ) 0.5 MG tablet TAKE 1 TABLET BY MOUTH AT BEDTIME AS NEEDED FOR ANXIETY 30 tablet 3   atorvastatin  (LIPITOR) 20 MG tablet Take 1 tablet by mouth once daily 90 tablet 1   Blood Glucose Monitoring Suppl (ACCU-CHEK GUIDE ME) w/Device KIT 1 each by Does not apply route 2 (two) times daily. 1 kit 0   busPIRone  (BUSPAR ) 10 MG tablet Take 1 tablet by mouth twice daily 180 tablet 1   carvedilol  (COREG ) 6.25 MG tablet Take 1 tablet (6.25 mg total) by mouth 2 (two) times daily with a meal. 180 tablet 1   famotidine  (PEPCID ) 20 MG tablet TAKE 1 TABLET BY MOUTH ONCE DAILY AFTER SUPPER 90 tablet 3   fluticasone  (FLONASE ) 50 MCG/ACT nasal spray Use 2 spray(s) in each nostril once daily 16 g 2   gabapentin  (NEURONTIN ) 400 MG capsule Take 1 capsule (400 mg total) by mouth 2 (two) times daily. 180 capsule 1   glucose blood (ACCU-CHEK GUIDE) test strip Use as instructed 200 each 12   naproxen  (NAPROSYN )  500 MG tablet Take 1 tablet (500 mg total) by mouth 2 (two) times daily with a meal. 60 tablet 2   OHTUVAYRE  3 MG/2.5ML SUSP Inhale one vial by nebulizer twice daily. Shake well before use. Do not mix with other nebulized medications. 150 mL 5   pantoprazole  (PROTONIX ) 40 MG tablet TAKE 1 TABLET BY MOUTH ONCE DAILY 30-60  MINUTES  BEFORE  FIRST  MEAL 30 tablet 11   Respiratory Therapy Supplies (NEBULIZER MASK ADULT/TUBING) MISC 1 each by Does not apply route as directed. 1 each 5   SYMBICORT  160-4.5 MCG/ACT inhaler INHALE 2 PUFFS BY MOUTH FIRST THING IN THE MORNING, THEN REPEAT ANOTHER 2 PUFFS ABOUT 12 HOURS LATER 11 g 11   tamsulosin (FLOMAX) 0.4 MG CAPS capsule Take 0.4 mg by mouth daily.  11   Tiotropium Bromide Monohydrate  (SPIRIVA  RESPIMAT) 2.5 MCG/ACT AERS INHALE 2 SPRAY(S) BY MOUTH IN THE MORNING 4 g 11   tirzepatide  (MOUNJARO ) 5 MG/0.5ML Pen Inject 5 mg into the skin once a week. 6 mL 1   triamcinolone  ointment (KENALOG ) 0.5 % Apply topically 2 (two) times daily. 30 g 1   VENTOLIN  HFA 108 (90 Base) MCG/ACT inhaler INHALE 1 TO 2 PUFFS BY MOUTH EVERY 6 HOURS AS NEEDED FOR WHEEZING AND FOR SHORTNESS OF BREATH 18 g 9   OXYGEN  2 with sleep and exertion     No current facility-administered medications for this visit.    Allergies as of 09/08/2024 - Review Complete 09/08/2024  Allergen Reaction Noted   Adhesive [tape] Rash 01/17/2016    Social History   Socioeconomic History   Marital status: Married    Spouse name: Not on file   Number of children: Not on file   Years of education: Not on file   Highest education level: Not on file  Occupational History   Not on file  Tobacco Use   Smoking status: Former    Current packs/day: 0.00    Average packs/day: 2.0 packs/day for 39.0 years (78.0 ttl pk-yrs)    Types: Cigarettes    Start date: 01/09/1972    Quit date: 01/08/2011    Years since quitting: 13.6   Smokeless tobacco: Never  Vaping Use   Vaping status: Never  Used  Substance  and Sexual Activity   Alcohol use: No    Alcohol/week: 0.0 standard drinks of alcohol   Drug use: No   Sexual activity: Yes    Birth control/protection: None  Other Topics Concern   Not on file  Social History Narrative   Not on file   Social Drivers of Health   Financial Resource Strain: Not on file  Food Insecurity: Not on file  Transportation Needs: Not on file  Physical Activity: Not on file  Stress: Not on file  Social Connections: Not on file    Review of systems General: negative for malaise, night sweats, fever, chills, weight loss Neck: Negative for lumps, goiter, pain and significant neck swelling Resp: Negative for cough, wheezing, dyspnea at rest CV: Negative for chest pain, leg swelling, palpitations, orthopnea GI: denies melena, hematochezia, nausea, vomiting, diarrhea, constipation, dysphagia, odyonophagia, early satiety or unintentional weight loss.  MSK: Negative for joint pain or swelling, back pain, and muscle pain. Derm: Negative for itching or rash Psych: Denies depression, anxiety, memory loss, confusion. No homicidal or suicidal ideation.  Heme: Negative for prolonged bleeding, bruising easily, and swollen nodes. Endocrine: Negative for cold or heat intolerance, polyuria, polydipsia and goiter. Neuro: negative for tremor, gait imbalance, syncope and seizures. The remainder of the review of systems is noncontributory.  Physical Exam: BP 131/73 (BP Location: Left Arm, Patient Position: Sitting)   Pulse 74   Temp 97.8 F (36.6 C) (Oral)   Ht 5' 8 (1.727 m)   Wt 298 lb 12.8 oz (135.5 kg)   BMI 45.43 kg/m  General:   Alert and oriented. No distress noted. Pleasant and cooperative.  Head:  Normocephalic and atraumatic. Eyes:  Conjuctiva clear without scleral icterus. Mouth:  Oral mucosa pink and moist. Good dentition. No lesions. Heart: Normal rate and rhythm, s1 and s2 heart sounds present.  Lungs: Clear lung sounds in all lobes. Respirations equal  and unlabored. Abdomen:  +BS, soft, non-tender and non-distended. No rebound or guarding. No HSM or masses noted. Derm: No palmar erythema or jaundice Msk:  Symmetrical without gross deformities. Normal posture. Extremities:  Without edema. Neurologic:  Alert and  oriented x4 Psych:  Alert and cooperative. Normal mood and affect.  Invalid input(s): 6 MONTHS   ASSESSMENT: Kenneth Casey is a 68 y.o. male presenting today for follow up of dysphagia  Dysphagia resolved after recent EGD with dilation of Schatzki ring. GERD well controlled on PPI and H2B which his pulmonologist manages. Recommend continuing PPI and H2B, good reflux precautions. Colonoscopy not due again for 10 years, as outlined above.    PLAN:  -continue PPI and Famotidine  -good reflux precautions  All questions were answered, patient verbalized understanding and is in agreement with plan as outlined above.   Follow Up: 1 year   Myan Suit L. Laryah Neuser, MSN, APRN, AGNP-C Adult-Gerontology Nurse Practitioner [facility]

## 2024-09-09 ENCOUNTER — Other Ambulatory Visit: Payer: Self-pay | Admitting: Internal Medicine

## 2024-09-22 ENCOUNTER — Other Ambulatory Visit: Payer: Self-pay | Admitting: Internal Medicine

## 2024-09-22 DIAGNOSIS — F99 Mental disorder, not otherwise specified: Secondary | ICD-10-CM

## 2024-09-22 DIAGNOSIS — F411 Generalized anxiety disorder: Secondary | ICD-10-CM

## 2024-10-12 ENCOUNTER — Ambulatory Visit (HOSPITAL_COMMUNITY)
Admission: RE | Admit: 2024-10-12 | Discharge: 2024-10-12 | Disposition: A | Discharge status: Home / Self Care | Source: Ambulatory Visit | Attending: Internal Medicine | Admitting: Internal Medicine

## 2024-10-12 DIAGNOSIS — Z122 Encounter for screening for malignant neoplasm of respiratory organs: Secondary | ICD-10-CM | POA: Insufficient documentation

## 2024-10-12 DIAGNOSIS — Z87891 Personal history of nicotine dependence: Secondary | ICD-10-CM | POA: Insufficient documentation

## 2024-10-16 ENCOUNTER — Other Ambulatory Visit: Payer: Self-pay | Admitting: Acute Care

## 2024-10-16 DIAGNOSIS — Z122 Encounter for screening for malignant neoplasm of respiratory organs: Secondary | ICD-10-CM

## 2024-10-16 DIAGNOSIS — Z87891 Personal history of nicotine dependence: Secondary | ICD-10-CM

## 2024-10-19 ENCOUNTER — Other Ambulatory Visit: Payer: Self-pay | Admitting: Internal Medicine

## 2024-10-19 ENCOUNTER — Other Ambulatory Visit: Payer: Self-pay | Admitting: Family Medicine

## 2024-10-19 ENCOUNTER — Telehealth: Payer: Self-pay

## 2024-10-19 DIAGNOSIS — R21 Rash and other nonspecific skin eruption: Secondary | ICD-10-CM

## 2024-10-19 DIAGNOSIS — E1169 Type 2 diabetes mellitus with other specified complication: Secondary | ICD-10-CM

## 2024-10-19 MED ORDER — TIRZEPATIDE 7.5 MG/0.5ML ~~LOC~~ SOAJ: 7.5000 mg | SUBCUTANEOUS | 0 refills | Status: DC

## 2024-10-19 NOTE — Telephone Encounter (Signed)
 Copied from CRM #[id number]. Topic: Clinical - Medication Question >> Oct 19, 2024  9:28 AM Kenneth Casey wrote: Reason for CRM: The patient shares that they have called to follow up on previous discussions related to increasing their tirzepatide  (MOUNJARO ). The patient shares that is is time for them to take their medication soon but would like to know if they can still expect to be increased from 5 to 7.5 MG. Please contact further when possible

## 2024-10-19 NOTE — Telephone Encounter (Signed)
Pt informed

## 2024-10-21 ENCOUNTER — Other Ambulatory Visit: Payer: Self-pay | Admitting: Internal Medicine

## 2024-10-21 DIAGNOSIS — R21 Rash and other nonspecific skin eruption: Secondary | ICD-10-CM

## 2024-10-21 MED ORDER — TRIAMCINOLONE ACETONIDE 0.5 % EX OINT: TOPICAL_OINTMENT | Freq: Two times a day (BID) | CUTANEOUS | 1 refills | Status: AC

## 2024-10-27 ENCOUNTER — Other Ambulatory Visit: Payer: Self-pay | Admitting: Internal Medicine

## 2024-10-27 DIAGNOSIS — G629 Polyneuropathy, unspecified: Secondary | ICD-10-CM

## 2024-10-27 DIAGNOSIS — N451 Epididymitis: Secondary | ICD-10-CM

## 2024-10-27 DIAGNOSIS — I10 Essential (primary) hypertension: Secondary | ICD-10-CM

## 2024-10-27 DIAGNOSIS — J302 Other seasonal allergic rhinitis: Secondary | ICD-10-CM

## 2024-11-16 ENCOUNTER — Ambulatory Visit: Admitting: Internal Medicine

## 2024-11-20 ENCOUNTER — Other Ambulatory Visit (HOSPITAL_COMMUNITY): Payer: Self-pay

## 2024-11-23 ENCOUNTER — Ambulatory Visit (INDEPENDENT_AMBULATORY_CARE_PROVIDER_SITE_OTHER): Admitting: Internal Medicine

## 2024-11-23 ENCOUNTER — Encounter: Payer: Self-pay | Admitting: Internal Medicine

## 2024-11-23 VITALS — BP 115/74 | HR 82 | Ht 68.0 in | Wt 301.0 lb

## 2024-11-23 DIAGNOSIS — F411 Generalized anxiety disorder: Secondary | ICD-10-CM | POA: Diagnosis not present

## 2024-11-23 DIAGNOSIS — E1169 Type 2 diabetes mellitus with other specified complication: Secondary | ICD-10-CM | POA: Diagnosis not present

## 2024-11-23 DIAGNOSIS — J9612 Chronic respiratory failure with hypercapnia: Secondary | ICD-10-CM | POA: Diagnosis not present

## 2024-11-23 DIAGNOSIS — E782 Mixed hyperlipidemia: Secondary | ICD-10-CM

## 2024-11-23 DIAGNOSIS — J9611 Chronic respiratory failure with hypoxia: Secondary | ICD-10-CM | POA: Diagnosis not present

## 2024-11-23 DIAGNOSIS — R351 Nocturia: Secondary | ICD-10-CM | POA: Diagnosis not present

## 2024-11-23 DIAGNOSIS — N401 Enlarged prostate with lower urinary tract symptoms: Secondary | ICD-10-CM

## 2024-11-23 DIAGNOSIS — Z7985 Long-term (current) use of injectable non-insulin antidiabetic drugs: Secondary | ICD-10-CM

## 2024-11-23 DIAGNOSIS — J449 Chronic obstructive pulmonary disease, unspecified: Secondary | ICD-10-CM

## 2024-11-23 DIAGNOSIS — E559 Vitamin D deficiency, unspecified: Secondary | ICD-10-CM | POA: Diagnosis not present

## 2024-11-23 DIAGNOSIS — M179 Osteoarthritis of knee, unspecified: Secondary | ICD-10-CM | POA: Insufficient documentation

## 2024-11-23 DIAGNOSIS — M170 Bilateral primary osteoarthritis of knee: Secondary | ICD-10-CM

## 2024-11-23 DIAGNOSIS — I10 Essential (primary) hypertension: Secondary | ICD-10-CM | POA: Diagnosis not present

## 2024-11-23 DIAGNOSIS — Z125 Encounter for screening for malignant neoplasm of prostate: Secondary | ICD-10-CM | POA: Insufficient documentation

## 2024-11-23 MED ORDER — TIRZEPATIDE 10 MG/0.5ML ~~LOC~~ SOAJ: 10.0000 mg | SUBCUTANEOUS | 1 refills | Status: AC

## 2024-11-23 NOTE — Assessment & Plan Note (Addendum)
 Lab Results  Component Value Date   HGBA1C 6.2 (H) 07/15/2024   HbA1c was more than 6.5 till 2016 Associated with HTN, neuropathy Overall well-controlled now, was on Ozempic  in the past On Mounjaro  7.5 mg qw -plan to increase dose as tolerated, increased to 10 mg qw, can also help with OSA Advised to follow diabetic diet On statin F/u CMP and lipid panel Diabetic eye exam: Advised to follow up with Ophthalmology for diabetic eye exam  On Gabapentin  400 mg BID for neuropathy

## 2024-11-23 NOTE — Progress Notes (Signed)
 Established Patient Office Visit  Subjective:  Patient ID: Kenneth Casey, male    DOB: [birth year]  Age: 68 y.o. MRN: [id number]  CC:  Chief Complaint  Patient presents with   Diabetes    Four month follow up    Hypertension    Four month follow up    HPI Kenneth Casey is a 68 y.o. male with past medical history of HTN, HFpEF, COPD, GERD, type II DM, OSA and morbid obesity who presents for f/u of his chronic medical conditions.  HTN and HFpEF: BP is well-controlled. Takes carvedilol  6.25 mg twice daily regularly. Patient denies headache, dizziness, chest pain, dyspnea or palpitations.   Type II DM: His HbA1c was 6.2 in 08/25. His HbA1c was 6.5 in 2016, and has been higher than that in the past. He has lost 33 lbs since starting Mounjaro , but has been stable since the last visit.  He admits that he still needs to improve his diet.  His physical activity is limited due to chronic bilateral knee pain and chronic respiratory failure.  Has chronic fatigue, but denies any polyuria or polydipsia.  He also has chronic numbness and burning sensation on right thigh area, takes gabapentin  400 mg BID.   GERD: He takes pantoprazole  40 mg QD and Pepcid  20 mg every evening for it. Followed by GI.   COPD: He has Symbicort  and Ohtuvayre  neb for it.  Followed by pulmonology.  He has chronic, intermittent dyspnea and wheezing.  He uses oxygen  at nighttime and with exertion.   Anxiety and insomnia: He takes buspirone  10 mg QD and Xanax  0.5 mg nightly. His sleep quality has improved now.  OA of knee: He reports bilateral knee pain and swelling, left > right.  His pain is constant, sharp, nonradiating and worse with prolonged standing and walking.  He takes naproxen  as needed for pain, which helps somewhat.    Past Medical History:  Diagnosis Date   COPD (chronic obstructive pulmonary disease) (HCC)    Obese    Pneumonia     Past Surgical History:  Procedure Laterality Date   CHOLECYSTECTOMY      COLONOSCOPY N/A 08/04/2024   Procedure: COLONOSCOPY;  Surgeon: Cinderella Deatrice FALCON, MD;  [facility]: [facility];  Service: Endoscopy;  Laterality: N/A;  730am, asa 2   ESOPHAGEAL DILATION N/A 08/04/2024   Procedure: DILATION, ESOPHAGUS;  Surgeon: Cinderella Deatrice FALCON, MD;  [facility];  Service: Endoscopy;  Laterality: N/A;   ESOPHAGOGASTRODUODENOSCOPY N/A 08/04/2024   Procedure: EGD (ESOPHAGOGASTRODUODENOSCOPY);  Surgeon: Cinderella Deatrice FALCON, MD;  [facility]: [facility];  Service: Endoscopy;  Laterality: N/A;    Family History  Problem Relation Age of Onset   Cancer Mother        breast cancer, lung cancer   Diabetes Father    Cancer Maternal Aunt    Heart disease Paternal Uncle     Social History   Socioeconomic History   Marital status: Married    Spouse name: Not on file   Number of children: Not on file   Years of education: Not on file   Highest education level: Not on file  Occupational History   Not on file  Tobacco Use   Smoking status: Former    Current packs/day: 0.00    Average packs/day: 2.0 packs/day for 39.0 years (78.0 ttl pk-yrs)    Types: Cigarettes    Start date: 01/09/1972    Quit date: 01/08/2011    Years since quitting: 13.8  Smokeless tobacco: Never  Vaping Use   Vaping status: Never Used  Substance and Sexual Activity   Alcohol use: No    Alcohol/week: 0.0 standard drinks of alcohol   Drug use: No   Sexual activity: Yes    Birth control/protection: None  Other Topics Concern   Not on file  Social History Narrative   Not on file   Social Drivers of Health   Tobacco Use: Medium Risk (11/23/2024)   Patient History    Smoking Tobacco Use: Former    Smokeless Tobacco Use: Never    Passive Exposure: Not on Actuary Strain: Not on file  Food Insecurity: Not on file  Transportation Needs: Not on file  Physical Activity: Not on file  Stress: Not on file  Social Connections: Not on file  Intimate Partner Violence: Not on  file  Depression (PHQ2-9): Low Risk (11/23/2024)   Depression (PHQ2-9)    PHQ-2 Score: 0  Alcohol Screen: Not on file  Housing: Not on file  Utilities: Not on file  Health Literacy: Not on file    Outpatient Medications Prior to Visit  Medication Sig Dispense Refill   Accu-Chek Softclix Lancets lancets Use as instructed 200 each 12   acetaminophen  (TYLENOL ) 500 MG tablet Take 1,000 mg by mouth every 6 (six) hours as needed for moderate pain.     albuterol  (PROVENTIL ) (2.5 MG/3ML) 0.083% nebulizer solution USE 1 VIAL IN NEBULIZER EVERY 4 HOURS AS NEEDED FOR WHEEZING AND FOR SHORTNESS OF BREATH 75 mL 2   ALPRAZolam  (XANAX ) 0.5 MG tablet TAKE 1 TABLET BY MOUTH AT BEDTIME AS NEEDED FOR ANXIETY 30 tablet 3   atorvastatin  (LIPITOR) 20 MG tablet Take 1 tablet by mouth once daily 90 tablet 1   Blood Glucose Monitoring Suppl (ACCU-CHEK GUIDE ME) w/Device KIT 1 each by Does not apply route 2 (two) times daily. 1 kit 0   busPIRone  (BUSPAR ) 10 MG tablet Take 1 tablet by mouth twice daily 180 tablet 1   carvedilol  (COREG ) 6.25 MG tablet TAKE 1 TABLET BY MOUTH TWICE DAILY WITH A MEAL 180 tablet 0   famotidine  (PEPCID ) 20 MG tablet TAKE 1 TABLET BY MOUTH ONCE DAILY AFTER SUPPER 90 tablet 3   fluticasone  (FLONASE ) 50 MCG/ACT nasal spray Use 2 spray(s) in each nostril once daily 16 g 0   gabapentin  (NEURONTIN ) 400 MG capsule Take 1 capsule by mouth twice daily 180 capsule 0   glucose blood (ACCU-CHEK GUIDE) test strip Use as instructed 200 each 12   naproxen  (NAPROSYN ) 500 MG tablet TAKE 1 TABLET BY MOUTH TWICE DAILY WITH A MEAL 60 tablet 0   OHTUVAYRE  3 MG/2.5ML SUSP Inhale one vial by nebulizer twice daily. Shake well before use. Do not mix with other nebulized medications. 150 mL 5   OXYGEN  2 with sleep and exertion     pantoprazole  (PROTONIX ) 40 MG tablet TAKE 1 TABLET BY MOUTH ONCE DAILY 30-60  MINUTES  BEFORE  FIRST  MEAL 30 tablet 11   Respiratory Therapy Supplies (NEBULIZER MASK ADULT/TUBING) MISC  1 each by Does not apply route as directed. 1 each 5   SYMBICORT  160-4.5 MCG/ACT inhaler INHALE 2 PUFFS BY MOUTH FIRST THING IN THE MORNING, THEN REPEAT ANOTHER 2 PUFFS ABOUT 12 HOURS LATER 11 g 11   tamsulosin (FLOMAX) 0.4 MG CAPS capsule Take 0.4 mg by mouth daily.  11   Tiotropium Bromide Monohydrate  (SPIRIVA  RESPIMAT) 2.5 MCG/ACT AERS INHALE 2 SPRAY(S) BY MOUTH IN THE MORNING 4  g 11   triamcinolone  ointment (KENALOG ) 0.5 % Apply topically 2 (two) times daily. 30 g 1   VENTOLIN  HFA 108 (90 Base) MCG/ACT inhaler INHALE 1 TO 2 PUFFS BY MOUTH EVERY 6 HOURS AS NEEDED FOR WHEEZING AND FOR SHORTNESS OF BREATH 18 g 9   tirzepatide  (MOUNJARO ) 7.5 MG/0.5ML Pen Inject 7.5 mg into the skin once a week. 6 mL 0   No facility-administered medications prior to visit.    Allergies  Allergen Reactions   Adhesive [Tape] Rash    ROS Review of Systems  Constitutional:  Negative for chills and fever.  HENT:  Negative for congestion and sore throat.   Eyes:  Negative for pain and discharge.  Respiratory:  Positive for shortness of breath (Chronic, intermittent) and wheezing (Intermittent). Negative for cough.   Cardiovascular:  Negative for chest pain and palpitations.  Gastrointestinal:  Negative for diarrhea, nausea and vomiting.  Endocrine: Negative for polydipsia and polyuria.  Genitourinary:  Negative for dysuria and hematuria.  Musculoskeletal:  Positive for arthralgias (Bilateral knee pain). Negative for neck pain and neck stiffness.  Skin:  Negative for rash.  Neurological:  Positive for numbness (B/l LE (thigh)). Negative for dizziness and weakness.  Psychiatric/Behavioral:  Negative for agitation and behavioral problems.       Objective:    Physical Exam Vitals reviewed.  Constitutional:      General: He is not in acute distress.    Appearance: He is obese. He is not diaphoretic.  HENT:     Head: Normocephalic and atraumatic.     Nose: Nose normal.     Mouth/Throat:     Mouth:  Mucous membranes are moist.  Eyes:     General: No scleral icterus.    Extraocular Movements: Extraocular movements intact.  Cardiovascular:     Rate and Rhythm: Normal rate and regular rhythm.     Heart sounds: Normal heart sounds. No murmur heard. Pulmonary:     Breath sounds: Normal breath sounds. No wheezing or rales.  Musculoskeletal:     Cervical back: Neck supple. No tenderness.     Right knee: Swelling present. Decreased range of motion (Adduction).     Left knee: Swelling present. Normal range of motion.     Right lower leg: No edema.     Left lower leg: No edema.  Skin:    General: Skin is warm.     Findings: No rash.  Neurological:     General: No focal deficit present.     Mental Status: He is alert and oriented to person, place, and time.     Sensory: Sensory deficit (B/l thigh) present.  Psychiatric:        Mood and Affect: Mood normal.        Behavior: Behavior normal.     BP 115/74   Pulse 82   Ht 5' 8 (1.727 m)   Wt (!) 301 lb (136.5 kg)   SpO2 93%   BMI 45.77 kg/m  Wt Readings from Last 3 Encounters:  11/23/24 (!) 301 lb (136.5 kg)  09/08/24 298 lb 12.8 oz (135.5 kg)  07/22/24 296 lb 9.6 oz (134.5 kg)    Lab Results  Component Value Date   TSH 2.564 12/08/2015   Lab Results  Component Value Date   WBC 9.9 04/07/2024   HGB 15.7 04/07/2024   HCT 49.7 04/07/2024   MCV 90 04/07/2024   PLT 290 04/07/2024   Lab Results  Component Value Date   NA 141  07/15/2024   K 4.8 07/15/2024   CO2 27 07/15/2024   GLUCOSE 88 07/15/2024   BUN 17 07/15/2024   CREATININE 0.97 07/15/2024   BILITOT 0.4 07/15/2024   ALKPHOS 93 07/15/2024   AST 17 07/15/2024   ALT 14 07/15/2024   PROT 6.6 07/15/2024   ALBUMIN 3.9 07/15/2024   CALCIUM  9.1 07/15/2024   ANIONGAP 7 12/14/2015   EGFR 87 07/15/2024   Lab Results  Component Value Date   CHOL 115 04/07/2024   Lab Results  Component Value Date   HDL 32 (L) 04/07/2024   Lab Results  Component Value Date    LDLCALC 70 04/07/2024   Lab Results  Component Value Date   TRIG 60 04/07/2024   Lab Results  Component Value Date   CHOLHDL 3.6 04/07/2024   Lab Results  Component Value Date   HGBA1C 6.2 (H) 07/15/2024      Assessment & Plan:   Problem List Items Addressed This Visit       Cardiovascular and Mediastinum   Essential hypertension - Primary   BP Readings from Last 1 Encounters:  11/23/24 115/74   Well-controlled with Coreg  6.25 mg BID Counseled for compliance with the medications Advised DASH diet and moderate exercise/walking, at least 150 mins/week      Relevant Orders   TSH   CMP14+EGFR   CBC with Differential/Platelet     Respiratory   COPD GOLD 2 (Chronic)   Overall stable with Symbicort  and as needed albuterol  Has Ohtuvayre  neb as well Followed by pulmonology      Chronic respiratory failure with hypoxia and hypercapnia (HCC)   Due to COPD Has home O2, uses it at nighttime and with exertion        Endocrine   Type 2 diabetes mellitus with other specified complication (HCC)   Lab Results  Component Value Date   HGBA1C 6.2 (H) 07/15/2024   HbA1c was more than 6.5 till 2016 Associated with HTN, neuropathy Overall well-controlled now, was on Ozempic  in the past On Mounjaro  7.5 mg qw -plan to increase dose as tolerated, increased to 10 mg qw, can also help with OSA Advised to follow diabetic diet On statin F/u CMP and lipid panel Diabetic eye exam: Advised to follow up with Ophthalmology for diabetic eye exam  On Gabapentin  400 mg BID for neuropathy      Relevant Medications   tirzepatide  (MOUNJARO ) 10 MG/0.5ML Pen   Other Relevant Orders   Hemoglobin A1c   CMP14+EGFR     Musculoskeletal and Integument   Osteoarthritis of knee   Better controlled with naproxen  now Advised to alternate with Tylenol  arthritis        Genitourinary   Benign prostatic hyperplasia   Followed by urology On Flomax 0.4 mg QD      Relevant Orders   PSA      Other   GAD (generalized anxiety disorder) (Chronic)   Overall well controlled with buspirone  10 mg twice daily Increased dose of Xanax  to 0.5 mg nightly in the last visit for persistent insomnia      Mixed hyperlipidemia   Continue atorvastatin  20 mg once daily Check lipid profile      Relevant Orders   Lipid panel   Other Visit Diagnoses       Vitamin D  deficiency       Relevant Orders   VITAMIN D  25 Hydroxy (Vit-D Deficiency, Fractures)        Meds ordered this encounter  Medications  tirzepatide  (MOUNJARO ) 10 MG/0.5ML Pen    Sig: Inject 10 mg into the skin once a week.    Dispense:  6 mL    Refill:  1    Follow-up: Return in about 4 months (around 03/24/2025) for DM.    Suzzane MARLA Blanch, MD

## 2024-11-23 NOTE — Assessment & Plan Note (Signed)
 Followed by urology On Flomax 0.4 mg QD

## 2024-11-23 NOTE — Assessment & Plan Note (Signed)
 Overall stable with Symbicort  and as needed albuterol  Has Ohtuvayre  neb as well Followed by pulmonology

## 2024-11-23 NOTE — Assessment & Plan Note (Addendum)
 Overall well controlled with buspirone  10 mg twice daily Increased dose of Xanax  to 0.5 mg nightly in the last visit for persistent insomnia

## 2024-11-23 NOTE — Patient Instructions (Signed)
 Please start taking Mounjaro  10 mg once weekly after completing 7.5 mg doses.  Please continue to take medications as prescribed.  Please continue to follow low carb diet and ambulate as tolerated.

## 2024-11-23 NOTE — Assessment & Plan Note (Signed)
 Continue atorvastatin  20 mg once daily Check lipid profile

## 2024-11-23 NOTE — Assessment & Plan Note (Signed)
 Due to COPD Has home O2, uses it at nighttime and with exertion

## 2024-11-23 NOTE — Assessment & Plan Note (Signed)
 Better controlled with naproxen  now Advised to alternate with Tylenol  arthritis

## 2024-11-23 NOTE — Assessment & Plan Note (Signed)
 BP Readings from Last 1 Encounters:  11/23/24 115/74   Well-controlled with Coreg  6.25 mg BID Counseled for compliance with the medications Advised DASH diet and moderate exercise/walking, at least 150 mins/week

## 2024-11-24 ENCOUNTER — Telehealth: Payer: Self-pay

## 2024-11-24 ENCOUNTER — Ambulatory Visit: Payer: Self-pay | Admitting: Internal Medicine

## 2024-11-24 ENCOUNTER — Other Ambulatory Visit (HOSPITAL_COMMUNITY): Payer: Self-pay

## 2024-11-24 LAB — CMP14+EGFR
ALT: 12 IU/L (ref 0–44)
AST: 13 IU/L (ref 0–40)
Albumin: 3.9 g/dL (ref 3.9–4.9)
Alkaline Phosphatase: 94 IU/L (ref 47–123)
BUN/Creatinine Ratio: 22 (ref 10–24)
BUN: 20 mg/dL (ref 8–27)
Bilirubin Total: 0.4 mg/dL (ref 0.0–1.2)
CO2: 25 mmol/L (ref 20–29)
Calcium: 9.2 mg/dL (ref 8.6–10.2)
Chloride: 102 mmol/L (ref 96–106)
Creatinine, Ser: 0.89 mg/dL (ref 0.76–1.27)
Globulin, Total: 2.3 g/dL (ref 1.5–4.5)
Glucose: 94 mg/dL (ref 70–99)
Potassium: 4.4 mmol/L (ref 3.5–5.2)
Sodium: 140 mmol/L (ref 134–144)
Total Protein: 6.2 g/dL (ref 6.0–8.5)
eGFR: 95 mL/min/1.73 (ref >59)

## 2024-11-24 LAB — CBC WITH DIFFERENTIAL/PLATELET
Basophils Absolute: 0.1 x10E3/uL (ref 0.0–0.2)
Basos: 1 %
EOS (ABSOLUTE): 0.5 x10E3/uL — ABNORMAL HIGH (ref 0.0–0.4)
Eos: 5 %
Hematocrit: 47.9 % (ref 37.5–51.0)
Hemoglobin: 15 g/dL (ref 13.0–17.7)
Immature Grans (Abs): 0 x10E3/uL (ref 0.0–0.1)
Immature Granulocytes: 0 %
Lymphocytes Absolute: 1.6 x10E3/uL (ref 0.7–3.1)
Lymphs: 16 %
MCH: 28.1 pg (ref 26.6–33.0)
MCHC: 31.3 g/dL — ABNORMAL LOW (ref 31.5–35.7)
MCV: 90 fL (ref 79–97)
Monocytes Absolute: 0.7 x10E3/uL (ref 0.1–0.9)
Monocytes: 7 %
Neutrophils Absolute: 7 x10E3/uL (ref 1.4–7.0)
Neutrophils: 71 %
Platelets: 277 x10E3/uL (ref 150–450)
RBC: 5.34 x10E6/uL (ref 4.14–5.80)
RDW: 13.3 % (ref 11.6–15.4)
WBC: 9.9 x10E3/uL (ref 3.4–10.8)

## 2024-11-24 LAB — HEMOGLOBIN A1C
Est. average glucose Bld gHb Est-mCnc: 123 mg/dL
Hgb A1c MFr Bld: 5.9 % — ABNORMAL HIGH (ref 4.8–5.6)

## 2024-11-24 LAB — LIPID PANEL
Chol/HDL Ratio: 3.3 ratio (ref 0.0–5.0)
Cholesterol, Total: 106 mg/dL (ref 100–199)
HDL: 32 mg/dL — ABNORMAL LOW (ref >39)
LDL Chol Calc (NIH): 62 mg/dL (ref 0–99)
Triglycerides: 49 mg/dL (ref 0–149)
VLDL Cholesterol Cal: 12 mg/dL (ref 5–40)

## 2024-11-24 LAB — PSA: Prostate Specific Ag, Serum: 2.7 ng/mL (ref 0.0–4.0)

## 2024-11-24 LAB — TSH: TSH: 2.25 u[IU]/mL (ref 0.450–4.500)

## 2024-11-24 LAB — VITAMIN D 25 HYDROXY (VIT D DEFICIENCY, FRACTURES): Vit D, 25-Hydroxy: 18.2 ng/mL — ABNORMAL LOW (ref 30.0–100.0)

## 2024-11-24 NOTE — Telephone Encounter (Signed)
 Pharmacy Patient Advocate Encounter   Received notification from CoverMyMeds that prior authorization for Mounjaro  10mg /0.31ml is due for renewal.   Insurance verification completed.   The patient is insured through [facility]    Will resubmit after the 1st of the year

## 2024-11-24 NOTE — Telephone Encounter (Signed)
   Scanned in media

## 2024-12-25 ENCOUNTER — Other Ambulatory Visit: Payer: Self-pay | Admitting: Internal Medicine

## 2024-12-25 DIAGNOSIS — J302 Other seasonal allergic rhinitis: Secondary | ICD-10-CM

## 2024-12-25 DIAGNOSIS — N451 Epididymitis: Secondary | ICD-10-CM

## 2025-01-25 ENCOUNTER — Ambulatory Visit: Admitting: Internal Medicine

## 2025-01-26 ENCOUNTER — Ambulatory Visit: Admitting: Internal Medicine

## 2025-03-24 ENCOUNTER — Ambulatory Visit: Payer: Self-pay | Admitting: Internal Medicine
